# Patient Record
Sex: Female | Born: 1950
Health system: Southern US, Community
[De-identification: ages and names within clinical notes are randomized; demographics above are authoritative.]

## PROBLEM LIST (undated history)

## (undated) ENCOUNTER — Ambulatory Visit: Admission: EM | Discharge status: Absent without leave | Payer: Medicare Other

## (undated) DIAGNOSIS — L578 Other skin changes due to chronic exposure to nonionizing radiation: Secondary | ICD-10-CM

## (undated) DIAGNOSIS — T7840XA Allergy, unspecified, initial encounter: Secondary | ICD-10-CM

## (undated) DIAGNOSIS — D649 Anemia, unspecified: Secondary | ICD-10-CM

## (undated) DIAGNOSIS — IMO0001 Reserved for inherently not codable concepts without codable children: Secondary | ICD-10-CM

## (undated) DIAGNOSIS — M1990 Unspecified osteoarthritis, unspecified site: Secondary | ICD-10-CM

## (undated) DIAGNOSIS — B269 Mumps without complication: Secondary | ICD-10-CM

## (undated) DIAGNOSIS — M62838 Other muscle spasm: Secondary | ICD-10-CM

## (undated) DIAGNOSIS — G4730 Sleep apnea, unspecified: Secondary | ICD-10-CM

## (undated) DIAGNOSIS — K649 Unspecified hemorrhoids: Secondary | ICD-10-CM

## (undated) DIAGNOSIS — K317 Polyp of stomach and duodenum: Secondary | ICD-10-CM

## (undated) DIAGNOSIS — K219 Gastro-esophageal reflux disease without esophagitis: Secondary | ICD-10-CM

## (undated) DIAGNOSIS — S2220XA Unspecified fracture of sternum, initial encounter for closed fracture: Secondary | ICD-10-CM

## (undated) DIAGNOSIS — H269 Unspecified cataract: Secondary | ICD-10-CM

## (undated) DIAGNOSIS — J381 Polyp of vocal cord and larynx: Secondary | ICD-10-CM

## (undated) DIAGNOSIS — E559 Vitamin D deficiency, unspecified: Secondary | ICD-10-CM

## (undated) DIAGNOSIS — K449 Diaphragmatic hernia without obstruction or gangrene: Secondary | ICD-10-CM

## (undated) DIAGNOSIS — Z86010 Personal history of colonic polyps: Secondary | ICD-10-CM

## (undated) DIAGNOSIS — N816 Rectocele: Secondary | ICD-10-CM

## (undated) DIAGNOSIS — I10 Essential (primary) hypertension: Secondary | ICD-10-CM

## (undated) DIAGNOSIS — IMO0002 Reserved for concepts with insufficient information to code with codable children: Secondary | ICD-10-CM

## (undated) DIAGNOSIS — B019 Varicella without complication: Secondary | ICD-10-CM

## (undated) DIAGNOSIS — K589 Irritable bowel syndrome without diarrhea: Secondary | ICD-10-CM

## (undated) DIAGNOSIS — R42 Dizziness and giddiness: Secondary | ICD-10-CM

## (undated) DIAGNOSIS — E871 Hypo-osmolality and hyponatremia: Secondary | ICD-10-CM

## (undated) DIAGNOSIS — M8580 Other specified disorders of bone density and structure, unspecified site: Secondary | ICD-10-CM

## (undated) DIAGNOSIS — H8110 Benign paroxysmal vertigo, unspecified ear: Secondary | ICD-10-CM

## (undated) DIAGNOSIS — L719 Rosacea, unspecified: Secondary | ICD-10-CM

## (undated) DIAGNOSIS — N809 Endometriosis, unspecified: Secondary | ICD-10-CM

## (undated) DIAGNOSIS — B059 Measles without complication: Secondary | ICD-10-CM

## (undated) HISTORY — DX: Hypo-osmolality and hyponatremia: E87.1

## (undated) HISTORY — DX: Polyp of vocal cord and larynx: J38.1

## (undated) HISTORY — DX: Vitamin D deficiency, unspecified: E55.9

## (undated) HISTORY — PX: BREAST BIOPSY: SHX20

## (undated) HISTORY — DX: Sleep apnea, unspecified: G47.30

## (undated) HISTORY — DX: Reserved for inherently not codable concepts without codable children: IMO0001

## (undated) HISTORY — DX: Diaphragmatic hernia without obstruction or gangrene: K44.9

## (undated) HISTORY — DX: Rosacea, unspecified: L71.9

## (undated) HISTORY — DX: Dizziness and giddiness: R42

## (undated) HISTORY — DX: Endometriosis, unspecified: N80.9

## (undated) HISTORY — DX: Unspecified osteoarthritis, unspecified site: M19.90

## (undated) HISTORY — PX: TONSILLECTOMY AND ADENOIDECTOMY: SUR1326

## (undated) HISTORY — DX: Other skin changes due to chronic exposure to nonionizing radiation: L57.8

## (undated) HISTORY — DX: Reserved for concepts with insufficient information to code with codable children: IMO0002

## (undated) HISTORY — DX: Unspecified fracture of sternum, initial encounter for closed fracture: S22.20XA

## (undated) HISTORY — DX: Unspecified cataract: H26.9

## (undated) HISTORY — DX: Benign paroxysmal vertigo, unspecified ear: H81.10

## (undated) HISTORY — DX: Gastro-esophageal reflux disease without esophagitis: K21.9

## (undated) HISTORY — DX: Anemia, unspecified: D64.9

## (undated) HISTORY — DX: Rectocele: N81.6

## (undated) HISTORY — PX: OTHER SURGICAL HISTORY: SHX169

## (undated) HISTORY — DX: Allergy, unspecified, initial encounter: T78.40XA

## (undated) HISTORY — DX: Personal history of colonic polyps: Z86.010

## (undated) HISTORY — DX: Irritable bowel syndrome without diarrhea: K58.9

## (undated) HISTORY — DX: Measles without complication: B05.9

## (undated) HISTORY — DX: Hypocalcemia: E83.51

## (undated) HISTORY — DX: Polyp of stomach and duodenum: K31.7

## (undated) HISTORY — DX: Other specified disorders of bone density and structure, unspecified site: M85.80

## (undated) HISTORY — DX: Other muscle spasm: M62.838

## (undated) HISTORY — DX: Mumps without complication: B26.9

## (undated) HISTORY — DX: Varicella without complication: B01.9

---
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
## 1979-03-27 HISTORY — PX: TUBAL LIGATION: SHX77

## 1987-03-27 HISTORY — PX: CHOLECYSTECTOMY: SHX55

## 1987-03-27 HISTORY — PX: APPENDECTOMY: SHX54

## 1997-03-26 HISTORY — PX: CYSTOSCOPY W/ URETERAL STENT REMOVAL: SHX1430

## 1997-03-26 HISTORY — PX: LEFT OOPHORECTOMY: SHX1961

## 1998-03-26 HISTORY — PX: TOTAL ABDOMINAL HYSTERECTOMY: SHX209

## 1998-03-26 HISTORY — PX: MMK / ANTERIOR VESICOURETHROPEXY / URETHROPEXY: SUR866

## 2003-03-27 HISTORY — PX: HEMORRHOID SURGERY: SHX153

## 2007-10-03 ENCOUNTER — Encounter: Admission: RE | Admit: 2007-10-03 | Discharge: 2007-10-03 | Payer: Self-pay | Admitting: Pediatrics

## 2007-12-24 ENCOUNTER — Emergency Department (HOSPITAL_COMMUNITY): Admission: EM | Admit: 2007-12-24 | Discharge: 2007-12-24 | Payer: Self-pay | Admitting: Emergency Medicine

## 2009-03-26 HISTORY — PX: ESOPHAGOGASTRODUODENOSCOPY: SHX1529

## 2009-03-26 HISTORY — PX: COLONOSCOPY: SHX174

## 2009-10-05 ENCOUNTER — Emergency Department (HOSPITAL_COMMUNITY): Admission: EM | Admit: 2009-10-05 | Discharge: 2009-10-05 | Payer: Self-pay | Admitting: Family Medicine

## 2009-12-15 ENCOUNTER — Ambulatory Visit (HOSPITAL_BASED_OUTPATIENT_CLINIC_OR_DEPARTMENT_OTHER): Admission: RE | Admit: 2009-12-15 | Discharge: 2009-12-15 | Payer: Self-pay | Admitting: Internal Medicine

## 2009-12-15 ENCOUNTER — Ambulatory Visit: Payer: Self-pay | Admitting: Radiology

## 2009-12-19 ENCOUNTER — Ambulatory Visit (HOSPITAL_BASED_OUTPATIENT_CLINIC_OR_DEPARTMENT_OTHER): Admission: RE | Admit: 2009-12-19 | Discharge: 2009-12-19 | Payer: Self-pay | Admitting: Internal Medicine

## 2009-12-19 ENCOUNTER — Ambulatory Visit: Payer: Self-pay | Admitting: Diagnostic Radiology

## 2010-01-20 ENCOUNTER — Encounter (HOSPITAL_COMMUNITY)
Admission: RE | Admit: 2010-01-20 | Discharge: 2010-03-25 | Payer: Self-pay | Source: Home / Self Care | Attending: Interventional Cardiology | Admitting: Interventional Cardiology

## 2010-02-03 ENCOUNTER — Encounter (INDEPENDENT_AMBULATORY_CARE_PROVIDER_SITE_OTHER): Payer: Self-pay | Admitting: Gastroenterology

## 2010-02-03 ENCOUNTER — Ambulatory Visit: Payer: Self-pay | Admitting: Surgery

## 2010-02-03 ENCOUNTER — Inpatient Hospital Stay (HOSPITAL_COMMUNITY)
Admission: RE | Admit: 2010-02-03 | Discharge: 2010-03-03 | Payer: Self-pay | Source: Home / Self Care | Attending: Gastroenterology | Admitting: Gastroenterology

## 2010-02-03 ENCOUNTER — Ambulatory Visit: Payer: Self-pay | Admitting: Internal Medicine

## 2010-03-09 ENCOUNTER — Ambulatory Visit: Payer: Self-pay | Admitting: Surgery

## 2010-03-09 ENCOUNTER — Encounter
Admission: RE | Admit: 2010-03-09 | Discharge: 2010-03-09 | Payer: Self-pay | Source: Home / Self Care | Attending: Surgery | Admitting: Surgery

## 2010-03-28 ENCOUNTER — Ambulatory Visit (HOSPITAL_BASED_OUTPATIENT_CLINIC_OR_DEPARTMENT_OTHER)
Admission: RE | Admit: 2010-03-28 | Discharge: 2010-03-28 | Payer: Self-pay | Source: Home / Self Care | Attending: Surgery | Admitting: Surgery

## 2010-03-29 ENCOUNTER — Ambulatory Visit
Admission: RE | Admit: 2010-03-29 | Discharge: 2010-03-29 | Payer: Self-pay | Source: Home / Self Care | Attending: Surgery | Admitting: Surgery

## 2010-04-10 ENCOUNTER — Ambulatory Visit (HOSPITAL_BASED_OUTPATIENT_CLINIC_OR_DEPARTMENT_OTHER)
Admission: RE | Admit: 2010-04-10 | Discharge: 2010-04-10 | Payer: Self-pay | Source: Home / Self Care | Attending: Gastroenterology | Admitting: Gastroenterology

## 2010-04-17 ENCOUNTER — Ambulatory Visit (HOSPITAL_COMMUNITY)
Admission: RE | Admit: 2010-04-17 | Discharge: 2010-04-17 | Payer: Self-pay | Source: Home / Self Care | Attending: Gastroenterology | Admitting: Gastroenterology

## 2010-04-18 LAB — CBC
HCT: 25.4 % — ABNORMAL LOW (ref 36.0–46.0)
Hemoglobin: 8.4 g/dL — ABNORMAL LOW (ref 12.0–15.0)
MCV: 85.5 fL (ref 78.0–100.0)
RBC: 2.97 MIL/uL — ABNORMAL LOW (ref 3.87–5.11)
RDW: 14.3 % (ref 11.5–15.5)
WBC: 7.2 10*3/uL (ref 4.0–10.5)

## 2010-04-18 LAB — COMPREHENSIVE METABOLIC PANEL
ALT: 26 U/L (ref 0–35)
Alkaline Phosphatase: 100 U/L (ref 39–117)
BUN: 12 mg/dL (ref 6–23)
CO2: 24 mEq/L (ref 19–32)
Chloride: 108 mEq/L (ref 96–112)
Glucose, Bld: 107 mg/dL — ABNORMAL HIGH (ref 70–99)
Potassium: 2.6 mEq/L — CL (ref 3.5–5.1)
Sodium: 139 mEq/L (ref 135–145)
Total Bilirubin: 0.3 mg/dL (ref 0.3–1.2)
Total Protein: 6.5 g/dL (ref 6.0–8.3)

## 2010-04-18 LAB — URINALYSIS, ROUTINE W REFLEX MICROSCOPIC
Ketones, ur: NEGATIVE mg/dL
Leukocytes, UA: NEGATIVE
Protein, ur: NEGATIVE mg/dL
Urine Glucose, Fasting: NEGATIVE mg/dL
Urobilinogen, UA: 0.2 mg/dL (ref 0.0–1.0)

## 2010-04-18 LAB — TYPE AND SCREEN

## 2010-04-18 LAB — PREALBUMIN: Prealbumin: 17.8 mg/dL — ABNORMAL LOW (ref 17.0–34.0)

## 2010-04-18 LAB — URINE MICROSCOPIC-ADD ON

## 2010-04-19 LAB — URINE CULTURE
Colony Count: NO GROWTH
Culture  Setup Time: 201201231432

## 2010-04-21 NOTE — Op Note (Addendum)
  Carrie Elliott, Carrie Elliott                ACCOUNT NO.:  [id number]  MEDICAL RECORD NO.:  [id number]          PATIENT TYPE:  AMB  LOCATION:  ENDO                         FACILITY:  [facility]  PHYSICIAN:  Petra Kuba, M.D.    DATE OF BIRTH:  10/31/1950  DATE OF PROCEDURE:  04/17/2010 DATE OF DISCHARGE:  04/17/2010                              OPERATIVE REPORT   PROCEDURE:  Esophagoscopy with stent removal.  INDICATIONS:  Patient with an esophageal stent placed for esophageal tear, ready to remove it.  Consent was signed after risks, benefits, methods, options thoroughly discussed multiple times in the past.  MEDICINES USED:  Fentanyl 100 mcg, Versed 10 mg, Zosyn prophylaxis.  PROCEDURE:  The video endoscope was inserted by direct vision.  The most proximal part of the esophagus was normal at about 25 cm.  The top of the stent was seen.  Using the rat tooth forceps, the cord to compress the stent was grabbed and withdrawn back into the scope and the scope and the stent were removed through the patient's mouth.  The stent was recovered.  The rat tooth was removed.  The scope was reinserted.  There was some heme from 25 cm to 35 cm, but no active bleeding.  There was a shelf at the 25 cm where the stent had been.  No signs of significant tear were seen.  At the distal esophagus was some inflammation, but again no obvious leak or active bleeding at the end of the procedure. We did advance scope short ways into the stomach.  Air was suctioned. We did not complete the complete visualization of the stomach.  Again, on slow withdrawal through the esophagus, confirmed above findings. There was no proximal esophagus or posterior pharyngeal trauma.  Scope was removed.  The patient tolerated the procedure well.  There was no obvious immediate complications.  ENDOSCOPIC DIAGNOSES: 1. Stent at 25 cm status post grabbed with the rat tooth and removed. 2. After removal, repeat esophagoscopy showed the  ledge at 25 cm with     heme throughout the esophagus as well as some distal heme and edema     without active bleeding or obvious residual tears. 3. Fully advanced to the proximal stomach.  PLAN:  We will go ahead and check a barium swallow; and if that is okay, very slowly advance her diet and slowly wean her TPN and wean her pain medicines hopefully as well and await recheck urinalysis to make sure that has been adequately treated and to call me sooner p.r.n. and otherwise follow up in 2 weeks.          ______________________________ Petra Kuba, M.D.     MEM/MEDQ  D:  04/17/2010  T:  04/18/2010  Job:  [id number]  cc:   Everardo All. Madilyn Fireman, M.D. Evelene Croon, M.D. Gildardo Griffes, M.D.  Electronically Signed by Vida Rigger M.D. on 04/21/2010 04:54:09 AM

## 2010-05-16 ENCOUNTER — Other Ambulatory Visit (HOSPITAL_COMMUNITY): Payer: Self-pay | Admitting: Nephrology

## 2010-05-16 ENCOUNTER — Other Ambulatory Visit: Payer: Self-pay | Admitting: Nephrology

## 2010-05-16 DIAGNOSIS — R93429 Abnormal radiologic findings on diagnostic imaging of unspecified kidney: Secondary | ICD-10-CM

## 2010-05-24 ENCOUNTER — Ambulatory Visit (HOSPITAL_COMMUNITY)
Admission: RE | Admit: 2010-05-24 | Discharge: 2010-05-24 | Disposition: A | Discharge status: Home / Self Care | Payer: 59 | Source: Ambulatory Visit | Attending: Nephrology | Admitting: Nephrology

## 2010-05-24 DIAGNOSIS — K449 Diaphragmatic hernia without obstruction or gangrene: Secondary | ICD-10-CM | POA: Insufficient documentation

## 2010-05-24 DIAGNOSIS — R160 Hepatomegaly, not elsewhere classified: Secondary | ICD-10-CM | POA: Insufficient documentation

## 2010-05-24 DIAGNOSIS — N289 Disorder of kidney and ureter, unspecified: Secondary | ICD-10-CM | POA: Insufficient documentation

## 2010-05-24 DIAGNOSIS — R93429 Abnormal radiologic findings on diagnostic imaging of unspecified kidney: Secondary | ICD-10-CM

## 2010-05-24 DIAGNOSIS — Q619 Cystic kidney disease, unspecified: Secondary | ICD-10-CM | POA: Insufficient documentation

## 2010-06-06 LAB — COMPREHENSIVE METABOLIC PANEL
ALT: 38 U/L — ABNORMAL HIGH (ref 0–35)
ALT: 46 U/L — ABNORMAL HIGH (ref 0–35)
ALT: 38 U/L — ABNORMAL HIGH (ref 0–35)
ALT: 41 U/L — ABNORMAL HIGH (ref 0–35)
ALT: 41 U/L — ABNORMAL HIGH (ref 0–35)
ALT: 43 U/L — ABNORMAL HIGH (ref 0–35)
ALT: 43 U/L — ABNORMAL HIGH (ref 0–35)
ALT: 44 U/L — ABNORMAL HIGH (ref 0–35)
ALT: 45 U/L — ABNORMAL HIGH (ref 0–35)
ALT: 47 U/L — ABNORMAL HIGH (ref 0–35)
ALT: 65 U/L — ABNORMAL HIGH (ref 0–35)
AST: 26 U/L (ref 0–37)
AST: 21 U/L (ref 0–37)
AST: 25 U/L (ref 0–37)
AST: 25 U/L (ref 0–37)
AST: 28 U/L (ref 0–37)
AST: 29 U/L (ref 0–37)
AST: 29 U/L (ref 0–37)
AST: 35 U/L (ref 0–37)
Albumin: 2.4 g/dL — ABNORMAL LOW (ref 3.5–5.2)
Albumin: 2.6 g/dL — ABNORMAL LOW (ref 3.5–5.2)
Albumin: 2.6 g/dL — ABNORMAL LOW (ref 3.5–5.2)
Albumin: 2.2 g/dL — ABNORMAL LOW (ref 3.5–5.2)
Albumin: 2.4 g/dL — ABNORMAL LOW (ref 3.5–5.2)
Albumin: 2.5 g/dL — ABNORMAL LOW (ref 3.5–5.2)
Albumin: 2.6 g/dL — ABNORMAL LOW (ref 3.5–5.2)
Albumin: 2.7 g/dL — ABNORMAL LOW (ref 3.5–5.2)
Albumin: 2.8 g/dL — ABNORMAL LOW (ref 3.5–5.2)
Albumin: 2.8 g/dL — ABNORMAL LOW (ref 3.5–5.2)
Alkaline Phosphatase: 166 U/L — ABNORMAL HIGH (ref 39–117)
Alkaline Phosphatase: 201 U/L — ABNORMAL HIGH (ref 39–117)
Alkaline Phosphatase: 137 U/L — ABNORMAL HIGH (ref 39–117)
Alkaline Phosphatase: 178 U/L — ABNORMAL HIGH (ref 39–117)
Alkaline Phosphatase: 181 U/L — ABNORMAL HIGH (ref 39–117)
Alkaline Phosphatase: 188 U/L — ABNORMAL HIGH (ref 39–117)
Alkaline Phosphatase: 190 U/L — ABNORMAL HIGH (ref 39–117)
Alkaline Phosphatase: 193 U/L — ABNORMAL HIGH (ref 39–117)
Alkaline Phosphatase: 54 U/L (ref 39–117)
BUN: 18 mg/dL (ref 6–23)
BUN: 19 mg/dL (ref 6–23)
BUN: 16 mg/dL (ref 6–23)
BUN: 17 mg/dL (ref 6–23)
BUN: 20 mg/dL (ref 6–23)
BUN: 22 mg/dL (ref 6–23)
BUN: 22 mg/dL (ref 6–23)
BUN: 23 mg/dL (ref 6–23)
BUN: 25 mg/dL — ABNORMAL HIGH (ref 6–23)
BUN: 25 mg/dL — ABNORMAL HIGH (ref 6–23)
BUN: 26 mg/dL — ABNORMAL HIGH (ref 6–23)
BUN: 27 mg/dL — ABNORMAL HIGH (ref 6–23)
BUN: 27 mg/dL — ABNORMAL HIGH (ref 6–23)
CO2: 22 mEq/L (ref 19–32)
CO2: 23 mEq/L (ref 19–32)
CO2: 23 mEq/L (ref 19–32)
CO2: 23 mEq/L (ref 19–32)
CO2: 23 mEq/L (ref 19–32)
CO2: 23 mEq/L (ref 19–32)
CO2: 24 mEq/L (ref 19–32)
CO2: 24 mEq/L (ref 19–32)
CO2: 24 mEq/L (ref 19–32)
CO2: 24 mEq/L (ref 19–32)
CO2: 24 mEq/L (ref 19–32)
CO2: 24 mEq/L (ref 19–32)
CO2: 24 mEq/L (ref 19–32)
CO2: 25 mEq/L (ref 19–32)
Calcium: 8.9 mg/dL (ref 8.4–10.5)
Calcium: 8.1 mg/dL — ABNORMAL LOW (ref 8.4–10.5)
Calcium: 8.8 mg/dL (ref 8.4–10.5)
Calcium: 8.8 mg/dL (ref 8.4–10.5)
Calcium: 8.8 mg/dL (ref 8.4–10.5)
Calcium: 9 mg/dL (ref 8.4–10.5)
Calcium: 9.1 mg/dL (ref 8.4–10.5)
Calcium: 9.2 mg/dL (ref 8.4–10.5)
Calcium: 9.3 mg/dL (ref 8.4–10.5)
Chloride: 100 mEq/L (ref 96–112)
Chloride: 97 mEq/L (ref 96–112)
Chloride: 103 mEq/L (ref 96–112)
Chloride: 104 mEq/L (ref 96–112)
Chloride: 105 mEq/L (ref 96–112)
Chloride: 107 mEq/L (ref 96–112)
Chloride: 107 mEq/L (ref 96–112)
Chloride: 110 mEq/L (ref 96–112)
Chloride: 113 mEq/L — ABNORMAL HIGH (ref 96–112)
Creatinine, Ser: 0.66 mg/dL (ref 0.4–1.2)
Creatinine, Ser: 0.93 mg/dL (ref 0.4–1.2)
Creatinine, Ser: 1.22 mg/dL — ABNORMAL HIGH (ref 0.4–1.2)
Creatinine, Ser: 1.29 mg/dL — ABNORMAL HIGH (ref 0.4–1.2)
Creatinine, Ser: 1.4 mg/dL — ABNORMAL HIGH (ref 0.4–1.2)
Creatinine, Ser: 1.53 mg/dL — ABNORMAL HIGH (ref 0.4–1.2)
Creatinine, Ser: 1.6 mg/dL — ABNORMAL HIGH (ref 0.4–1.2)
Creatinine, Ser: 1.64 mg/dL — ABNORMAL HIGH (ref 0.4–1.2)
Creatinine, Ser: 2.07 mg/dL — ABNORMAL HIGH (ref 0.4–1.2)
GFR calc Af Amer: >60 mL/min (ref >60)
GFR calc Af Amer: 30 mL/min — ABNORMAL LOW (ref >60)
GFR calc Af Amer: 49 mL/min — ABNORMAL LOW (ref >60)
GFR calc Af Amer: 51 mL/min — ABNORMAL LOW (ref >60)
GFR calc Af Amer: 55 mL/min — ABNORMAL LOW (ref >60)
GFR calc Af Amer: >60 mL/min (ref >60)
GFR calc Af Amer: >60 mL/min (ref >60)
GFR calc non Af Amer: >60 mL/min (ref >60)
GFR calc non Af Amer: 25 mL/min — ABNORMAL LOW (ref >60)
GFR calc non Af Amer: 32 mL/min — ABNORMAL LOW (ref >60)
GFR calc non Af Amer: 33 mL/min — ABNORMAL LOW (ref >60)
GFR calc non Af Amer: 35 mL/min — ABNORMAL LOW (ref >60)
GFR calc non Af Amer: 38 mL/min — ABNORMAL LOW (ref >60)
GFR calc non Af Amer: 40 mL/min — ABNORMAL LOW (ref >60)
GFR calc non Af Amer: 43 mL/min — ABNORMAL LOW (ref >60)
GFR calc non Af Amer: 45 mL/min — ABNORMAL LOW (ref >60)
GFR calc non Af Amer: 46 mL/min — ABNORMAL LOW (ref >60)
GFR calc non Af Amer: 52 mL/min — ABNORMAL LOW (ref >60)
GFR calc non Af Amer: 60 mL/min — ABNORMAL LOW (ref >60)
Glucose, Bld: 159 mg/dL — ABNORMAL HIGH (ref 70–99)
Glucose, Bld: 109 mg/dL — ABNORMAL HIGH (ref 70–99)
Glucose, Bld: 112 mg/dL — ABNORMAL HIGH (ref 70–99)
Glucose, Bld: 116 mg/dL — ABNORMAL HIGH (ref 70–99)
Glucose, Bld: 126 mg/dL — ABNORMAL HIGH (ref 70–99)
Glucose, Bld: 133 mg/dL — ABNORMAL HIGH (ref 70–99)
Glucose, Bld: 97 mg/dL (ref 70–99)
Potassium: 3.5 mEq/L (ref 3.5–5.1)
Potassium: 4 mEq/L (ref 3.5–5.1)
Potassium: 3.1 mEq/L — ABNORMAL LOW (ref 3.5–5.1)
Potassium: 3.4 mEq/L — ABNORMAL LOW (ref 3.5–5.1)
Potassium: 3.6 mEq/L (ref 3.5–5.1)
Potassium: 3.7 mEq/L (ref 3.5–5.1)
Potassium: 3.8 mEq/L (ref 3.5–5.1)
Potassium: 3.8 mEq/L (ref 3.5–5.1)
Sodium: 132 mEq/L — ABNORMAL LOW (ref 135–145)
Sodium: 132 mEq/L — ABNORMAL LOW (ref 135–145)
Sodium: 134 mEq/L — ABNORMAL LOW (ref 135–145)
Sodium: 134 mEq/L — ABNORMAL LOW (ref 135–145)
Sodium: 136 mEq/L (ref 135–145)
Sodium: 136 mEq/L (ref 135–145)
Sodium: 136 mEq/L (ref 135–145)
Sodium: 137 mEq/L (ref 135–145)
Sodium: 137 mEq/L (ref 135–145)
Sodium: 139 mEq/L (ref 135–145)
Total Bilirubin: 0.3 mg/dL (ref 0.3–1.2)
Total Bilirubin: <0.1 mg/dL — ABNORMAL LOW (ref 0.3–1.2)
Total Bilirubin: 0.2 mg/dL — ABNORMAL LOW (ref 0.3–1.2)
Total Bilirubin: 0.3 mg/dL (ref 0.3–1.2)
Total Bilirubin: 0.3 mg/dL (ref 0.3–1.2)
Total Bilirubin: 0.3 mg/dL (ref 0.3–1.2)
Total Bilirubin: 0.3 mg/dL (ref 0.3–1.2)
Total Bilirubin: 0.4 mg/dL (ref 0.3–1.2)
Total Bilirubin: 0.6 mg/dL (ref 0.3–1.2)
Total Bilirubin: 0.6 mg/dL (ref 0.3–1.2)
Total Protein: 6.3 g/dL (ref 6.0–8.3)
Total Protein: 5.1 g/dL — ABNORMAL LOW (ref 6.0–8.3)
Total Protein: 5.8 g/dL — ABNORMAL LOW (ref 6.0–8.3)
Total Protein: 6 g/dL (ref 6.0–8.3)
Total Protein: 6.2 g/dL (ref 6.0–8.3)
Total Protein: 6.3 g/dL (ref 6.0–8.3)
Total Protein: 6.3 g/dL (ref 6.0–8.3)
Total Protein: 6.6 g/dL (ref 6.0–8.3)
Total Protein: 6.7 g/dL (ref 6.0–8.3)

## 2010-06-06 LAB — PROTIME-INR
INR: 0.99 (ref 0.00–1.49)
INR: 1.19 (ref 0.00–1.49)
Prothrombin Time: 15.3 seconds — ABNORMAL HIGH (ref 11.6–15.2)

## 2010-06-06 LAB — HEPATIC FUNCTION PANEL
Albumin: 4.3 g/dL (ref 3.5–5.2)
Bilirubin, Direct: 0.1 mg/dL (ref 0.0–0.3)
Indirect Bilirubin: 0.6 mg/dL (ref 0.3–0.9)
Total Bilirubin: 0.7 mg/dL (ref 0.3–1.2)

## 2010-06-06 LAB — POCT I-STAT 3, ART BLOOD GAS (G3+)
Acid-base deficit: 4 mmol/L — ABNORMAL HIGH (ref 0.0–2.0)
Bicarbonate: 21.3 mEq/L (ref 20.0–24.0)
O2 Saturation: 95 %
O2 Saturation: 98 %
TCO2: 18 mmol/L (ref 0–100)
TCO2: 22 mmol/L (ref 0–100)
pCO2 arterial: 32.8 mmHg — ABNORMAL LOW (ref 35.0–45.0)
pCO2 arterial: 36.9 mmHg (ref 35.0–45.0)
pH, Arterial: 7.324 — ABNORMAL LOW (ref 7.350–7.400)
pO2, Arterial: 77 mmHg — ABNORMAL LOW (ref 80.0–100.0)

## 2010-06-06 LAB — CBC
HCT: 24.5 % — ABNORMAL LOW (ref 36.0–46.0)
HCT: 26.6 % — ABNORMAL LOW (ref 36.0–46.0)
HCT: 23.1 % — ABNORMAL LOW (ref 36.0–46.0)
HCT: 23.8 % — ABNORMAL LOW (ref 36.0–46.0)
HCT: 24.2 % — ABNORMAL LOW (ref 36.0–46.0)
HCT: 24.7 % — ABNORMAL LOW (ref 36.0–46.0)
HCT: 24.8 % — ABNORMAL LOW (ref 36.0–46.0)
HCT: 25.8 % — ABNORMAL LOW (ref 36.0–46.0)
HCT: 26.2 % — ABNORMAL LOW (ref 36.0–46.0)
HCT: 26.5 % — ABNORMAL LOW (ref 36.0–46.0)
HCT: 26.8 % — ABNORMAL LOW (ref 36.0–46.0)
HCT: 27.2 % — ABNORMAL LOW (ref 36.0–46.0)
HCT: 27.4 % — ABNORMAL LOW (ref 36.0–46.0)
HCT: 39 % (ref 36.0–46.0)
Hemoglobin: 7.8 g/dL — ABNORMAL LOW (ref 12.0–15.0)
Hemoglobin: 8 g/dL — ABNORMAL LOW (ref 12.0–15.0)
Hemoglobin: 8.5 g/dL — ABNORMAL LOW (ref 12.0–15.0)
Hemoglobin: 7.8 g/dL — ABNORMAL LOW (ref 12.0–15.0)
Hemoglobin: 7.9 g/dL — ABNORMAL LOW (ref 12.0–15.0)
Hemoglobin: 7.9 g/dL — ABNORMAL LOW (ref 12.0–15.0)
Hemoglobin: 8 g/dL — ABNORMAL LOW (ref 12.0–15.0)
Hemoglobin: 8.1 g/dL — ABNORMAL LOW (ref 12.0–15.0)
Hemoglobin: 8.2 g/dL — ABNORMAL LOW (ref 12.0–15.0)
Hemoglobin: 8.2 g/dL — ABNORMAL LOW (ref 12.0–15.0)
Hemoglobin: 8.3 g/dL — ABNORMAL LOW (ref 12.0–15.0)
Hemoglobin: 8.6 g/dL — ABNORMAL LOW (ref 12.0–15.0)
Hemoglobin: 8.6 g/dL — ABNORMAL LOW (ref 12.0–15.0)
Hemoglobin: 8.7 g/dL — ABNORMAL LOW (ref 12.0–15.0)
Hemoglobin: 8.9 g/dL — ABNORMAL LOW (ref 12.0–15.0)
Hemoglobin: 9.2 g/dL — ABNORMAL LOW (ref 12.0–15.0)
Hemoglobin: 9.3 g/dL — ABNORMAL LOW (ref 12.0–15.0)
Hemoglobin: 9.9 g/dL — ABNORMAL LOW (ref 12.0–15.0)
MCH: 29.1 pg (ref 26.0–34.0)
MCH: 29.2 pg (ref 26.0–34.0)
MCH: 28.8 pg (ref 26.0–34.0)
MCH: 29 pg (ref 26.0–34.0)
MCH: 29.1 pg (ref 26.0–34.0)
MCH: 29.1 pg (ref 26.0–34.0)
MCH: 29.2 pg (ref 26.0–34.0)
MCH: 29.2 pg (ref 26.0–34.0)
MCH: 29.3 pg (ref 26.0–34.0)
MCH: 29.4 pg (ref 26.0–34.0)
MCH: 29.5 pg (ref 26.0–34.0)
MCHC: 32.2 g/dL (ref 30.0–36.0)
MCHC: 32.5 g/dL (ref 30.0–36.0)
MCHC: 32.6 g/dL (ref 30.0–36.0)
MCHC: 32.6 g/dL (ref 30.0–36.0)
MCHC: 32.7 g/dL (ref 30.0–36.0)
MCHC: 32.8 g/dL (ref 30.0–36.0)
MCHC: 32.8 g/dL (ref 30.0–36.0)
MCHC: 32.8 g/dL (ref 30.0–36.0)
MCHC: 32.9 g/dL (ref 30.0–36.0)
MCHC: 33.6 g/dL (ref 30.0–36.0)
MCHC: 33.8 g/dL (ref 30.0–36.0)
MCHC: 34.6 g/dL (ref 30.0–36.0)
MCV: 89 fL (ref 78.0–100.0)
MCV: 89.4 fL (ref 78.0–100.0)
MCV: 86.3 fL (ref 78.0–100.0)
MCV: 86.6 fL (ref 78.0–100.0)
MCV: 87 fL (ref 78.0–100.0)
MCV: 87.8 fL (ref 78.0–100.0)
MCV: 87.8 fL (ref 78.0–100.0)
MCV: 88.7 fL (ref 78.0–100.0)
MCV: 88.8 fL (ref 78.0–100.0)
MCV: 89.2 fL (ref 78.0–100.0)
MCV: 89.8 fL (ref 78.0–100.0)
MCV: 90.1 fL (ref 78.0–100.0)
MCV: 90.2 fL (ref 78.0–100.0)
MCV: 90.3 fL (ref 78.0–100.0)
MCV: 90.4 fL (ref 78.0–100.0)
MCV: 90.6 fL (ref 78.0–100.0)
Platelets: 224 10*3/uL (ref 150–400)
Platelets: 256 10*3/uL (ref 150–400)
Platelets: 137 10*3/uL — ABNORMAL LOW (ref 150–400)
Platelets: 170 10*3/uL (ref 150–400)
Platelets: 195 10*3/uL (ref 150–400)
Platelets: 216 10*3/uL (ref 150–400)
Platelets: 224 10*3/uL (ref 150–400)
Platelets: 233 10*3/uL (ref 150–400)
Platelets: 248 10*3/uL (ref 150–400)
Platelets: 250 10*3/uL (ref 150–400)
Platelets: 257 10*3/uL (ref 150–400)
Platelets: 260 10*3/uL (ref 150–400)
RBC: 2.74 MIL/uL — ABNORMAL LOW (ref 3.87–5.11)
RBC: 2.99 MIL/uL — ABNORMAL LOW (ref 3.87–5.11)
RBC: 2.67 MIL/uL — ABNORMAL LOW (ref 3.87–5.11)
RBC: 2.72 MIL/uL — ABNORMAL LOW (ref 3.87–5.11)
RBC: 2.77 MIL/uL — ABNORMAL LOW (ref 3.87–5.11)
RBC: 2.78 MIL/uL — ABNORMAL LOW (ref 3.87–5.11)
RBC: 2.88 MIL/uL — ABNORMAL LOW (ref 3.87–5.11)
RBC: 2.93 MIL/uL — ABNORMAL LOW (ref 3.87–5.11)
RBC: 2.96 MIL/uL — ABNORMAL LOW (ref 3.87–5.11)
RBC: 2.99 MIL/uL — ABNORMAL LOW (ref 3.87–5.11)
RBC: 3.07 MIL/uL — ABNORMAL LOW (ref 3.87–5.11)
RBC: 3.12 MIL/uL — ABNORMAL LOW (ref 3.87–5.11)
RDW: 14.3 % (ref 11.5–15.5)
RDW: 14.4 % (ref 11.5–15.5)
RDW: 14.8 % (ref 11.5–15.5)
RDW: 15.3 % (ref 11.5–15.5)
RDW: 15.4 % (ref 11.5–15.5)
RDW: 15.6 % — ABNORMAL HIGH (ref 11.5–15.5)
RDW: 15.7 % — ABNORMAL HIGH (ref 11.5–15.5)
RDW: 15.8 % — ABNORMAL HIGH (ref 11.5–15.5)
RDW: 15.9 % — ABNORMAL HIGH (ref 11.5–15.5)
WBC: 7.4 10*3/uL (ref 4.0–10.5)
WBC: 9.6 10*3/uL (ref 4.0–10.5)
WBC: 10.5 10*3/uL (ref 4.0–10.5)
WBC: 10.5 10*3/uL (ref 4.0–10.5)
WBC: 11.7 10*3/uL — ABNORMAL HIGH (ref 4.0–10.5)
WBC: 12.1 10*3/uL — ABNORMAL HIGH (ref 4.0–10.5)
WBC: 6.5 10*3/uL (ref 4.0–10.5)
WBC: 6.8 10*3/uL (ref 4.0–10.5)
WBC: 7.1 10*3/uL (ref 4.0–10.5)
WBC: 8.3 10*3/uL (ref 4.0–10.5)
WBC: 8.4 10*3/uL (ref 4.0–10.5)
WBC: 8.9 10*3/uL (ref 4.0–10.5)
WBC: 9 10*3/uL (ref 4.0–10.5)
WBC: 9.6 10*3/uL (ref 4.0–10.5)

## 2010-06-06 LAB — GLUCOSE, CAPILLARY
Glucose-Capillary: 112 mg/dL — ABNORMAL HIGH (ref 70–99)
Glucose-Capillary: 112 mg/dL — ABNORMAL HIGH (ref 70–99)
Glucose-Capillary: 114 mg/dL — ABNORMAL HIGH (ref 70–99)
Glucose-Capillary: 119 mg/dL — ABNORMAL HIGH (ref 70–99)
Glucose-Capillary: 120 mg/dL — ABNORMAL HIGH (ref 70–99)
Glucose-Capillary: 122 mg/dL — ABNORMAL HIGH (ref 70–99)
Glucose-Capillary: 129 mg/dL — ABNORMAL HIGH (ref 70–99)
Glucose-Capillary: 131 mg/dL — ABNORMAL HIGH (ref 70–99)
Glucose-Capillary: 135 mg/dL — ABNORMAL HIGH (ref 70–99)
Glucose-Capillary: 137 mg/dL — ABNORMAL HIGH (ref 70–99)
Glucose-Capillary: 138 mg/dL — ABNORMAL HIGH (ref 70–99)
Glucose-Capillary: 138 mg/dL — ABNORMAL HIGH (ref 70–99)
Glucose-Capillary: 139 mg/dL — ABNORMAL HIGH (ref 70–99)
Glucose-Capillary: 140 mg/dL — ABNORMAL HIGH (ref 70–99)
Glucose-Capillary: 146 mg/dL — ABNORMAL HIGH (ref 70–99)
Glucose-Capillary: 148 mg/dL — ABNORMAL HIGH (ref 70–99)
Glucose-Capillary: 149 mg/dL — ABNORMAL HIGH (ref 70–99)
Glucose-Capillary: 157 mg/dL — ABNORMAL HIGH (ref 70–99)
Glucose-Capillary: 180 mg/dL — ABNORMAL HIGH (ref 70–99)

## 2010-06-06 LAB — BASIC METABOLIC PANEL
BUN: 10 mg/dL (ref 6–23)
BUN: 14 mg/dL (ref 6–23)
CO2: 26 mEq/L (ref 19–32)
Calcium: 8 mg/dL — ABNORMAL LOW (ref 8.4–10.5)
Calcium: 9.3 mg/dL (ref 8.4–10.5)
Chloride: 100 mEq/L (ref 96–112)
Chloride: 111 mEq/L (ref 96–112)
GFR calc Af Amer: >60 mL/min (ref >60)
GFR calc Af Amer: 25 mL/min — ABNORMAL LOW (ref >60)
GFR calc Af Amer: 25 mL/min — ABNORMAL LOW (ref >60)
GFR calc non Af Amer: 20 mL/min — ABNORMAL LOW (ref >60)
GFR calc non Af Amer: 29 mL/min — ABNORMAL LOW (ref >60)
GFR calc non Af Amer: >60 mL/min (ref >60)
Glucose, Bld: 105 mg/dL — ABNORMAL HIGH (ref 70–99)
Glucose, Bld: 124 mg/dL — ABNORMAL HIGH (ref 70–99)
Glucose, Bld: 127 mg/dL — ABNORMAL HIGH (ref 70–99)
Glucose, Bld: 140 mg/dL — ABNORMAL HIGH (ref 70–99)
Potassium: 3.9 mEq/L (ref 3.5–5.1)
Potassium: 3.5 mEq/L (ref 3.5–5.1)
Potassium: 3.7 mEq/L (ref 3.5–5.1)
Potassium: 3.9 mEq/L (ref 3.5–5.1)
Sodium: 133 mEq/L — ABNORMAL LOW (ref 135–145)
Sodium: 137 mEq/L (ref 135–145)
Sodium: 138 mEq/L (ref 135–145)
Sodium: 139 mEq/L (ref 135–145)

## 2010-06-06 LAB — POCT I-STAT 7, (LYTES, BLD GAS, ICA,H+H)
Calcium, Ion: 1.18 mmol/L (ref 1.12–1.32)
HCT: 37 % (ref 36.0–46.0)
Hemoglobin: 12.6 g/dL (ref 12.0–15.0)
pCO2 arterial: 43.2 mmHg (ref 35.0–45.0)
pH, Arterial: 7.32 — ABNORMAL LOW (ref 7.350–7.400)
pO2, Arterial: 386 mmHg — ABNORMAL HIGH (ref 80.0–100.0)

## 2010-06-06 LAB — DIFFERENTIAL
Basophils Absolute: 0 10*3/uL (ref 0.0–0.1)
Basophils Absolute: 0 10*3/uL (ref 0.0–0.1)
Basophils Relative: 0 % (ref 0–1)
Basophils Relative: 0 % (ref 0–1)
Eosinophils Absolute: 0 10*3/uL (ref 0.0–0.7)
Eosinophils Absolute: 0.2 10*3/uL (ref 0.0–0.7)
Eosinophils Absolute: 0.2 10*3/uL (ref 0.0–0.7)
Eosinophils Relative: 4 % (ref 0–5)
Eosinophils Relative: 0 % (ref 0–5)
Eosinophils Relative: 1 % (ref 0–5)
Eosinophils Relative: 2 % (ref 0–5)
Eosinophils Relative: 2 % (ref 0–5)
Eosinophils Relative: 3 % (ref 0–5)
Lymphocytes Relative: 10 % — ABNORMAL LOW (ref 12–46)
Lymphocytes Relative: 10 % — ABNORMAL LOW (ref 12–46)
Lymphocytes Relative: 12 % (ref 12–46)
Lymphocytes Relative: 6 % — ABNORMAL LOW (ref 12–46)
Lymphs Abs: 0.7 10*3/uL (ref 0.7–4.0)
Lymphs Abs: 0.7 10*3/uL (ref 0.7–4.0)
Lymphs Abs: 0.9 10*3/uL (ref 0.7–4.0)
Lymphs Abs: 0.9 10*3/uL (ref 0.7–4.0)
Monocytes Absolute: 1 10*3/uL (ref 0.1–1.0)
Monocytes Absolute: 1.4 10*3/uL — ABNORMAL HIGH (ref 0.1–1.0)
Monocytes Relative: 12 % (ref 3–12)
Monocytes Relative: 14 % — ABNORMAL HIGH (ref 3–12)
Monocytes Relative: 16 % — ABNORMAL HIGH (ref 3–12)
Monocytes Relative: 6 % (ref 3–12)
Neutro Abs: 5.1 10*3/uL (ref 1.7–7.7)
Neutro Abs: 5.4 10*3/uL (ref 1.7–7.7)
Neutrophils Relative %: 85 % — ABNORMAL HIGH (ref 43–77)

## 2010-06-06 LAB — PHOSPHORUS
Phosphorus: 3.1 mg/dL (ref 2.3–4.6)
Phosphorus: 4.4 mg/dL (ref 2.3–4.6)
Phosphorus: 4.5 mg/dL (ref 2.3–4.6)
Phosphorus: 3.3 mg/dL (ref 2.3–4.6)
Phosphorus: 4.3 mg/dL (ref 2.3–4.6)

## 2010-06-06 LAB — RENAL FUNCTION PANEL
CO2: 22 mEq/L (ref 19–32)
Calcium: 8.8 mg/dL (ref 8.4–10.5)
GFR calc Af Amer: 33 mL/min — ABNORMAL LOW (ref >60)
GFR calc non Af Amer: 27 mL/min — ABNORMAL LOW (ref >60)
Glucose, Bld: 142 mg/dL — ABNORMAL HIGH (ref 70–99)
Potassium: 2.8 mEq/L — ABNORMAL LOW (ref 3.5–5.1)
Sodium: 137 mEq/L (ref 135–145)

## 2010-06-06 LAB — MAGNESIUM
Magnesium: 1.5 mg/dL (ref 1.5–2.5)
Magnesium: 1.8 mg/dL (ref 1.5–2.5)
Magnesium: 2 mg/dL (ref 1.5–2.5)
Magnesium: 2 mg/dL (ref 1.5–2.5)
Magnesium: 2.1 mg/dL (ref 1.5–2.5)
Magnesium: 2.2 mg/dL (ref 1.5–2.5)
Magnesium: 2.2 mg/dL (ref 1.5–2.5)

## 2010-06-06 LAB — TRIGLYCERIDES
Triglycerides: 114 mg/dL (ref <150)
Triglycerides: 122 mg/dL (ref <150)

## 2010-06-06 LAB — CLOSTRIDIUM DIFFICILE EIA: C difficile Toxins A+B, EIA: NEGATIVE

## 2010-06-06 LAB — IRON AND TIBC: UIBC: 209 ug/dL

## 2010-06-06 LAB — BODY FLUID CELL COUNT WITH DIFFERENTIAL
Neutrophil Count, Fluid: 84 % — ABNORMAL HIGH (ref 0–25)
Total Nucleated Cell Count, Fluid: 6346 cu mm — ABNORMAL HIGH (ref 0–1000)

## 2010-06-06 LAB — CLOSTRIDIUM DIFFICILE BY PCR: Toxigenic C. Difficile by PCR: NOT DETECTED

## 2010-06-06 LAB — PROCALCITONIN: Procalcitonin: 1.55 ng/mL

## 2010-06-06 LAB — ABO/RH: ABO/RH(D): O POS

## 2010-06-06 LAB — LIPID PANEL
HDL: 29 mg/dL — ABNORMAL LOW (ref >39)
Total CHOL/HDL Ratio: 4.4 RATIO
VLDL: 23 mg/dL (ref 0–40)

## 2010-06-06 LAB — CHOLESTEROL, TOTAL
Cholesterol: 129 mg/dL (ref 0–200)
Cholesterol: 131 mg/dL (ref 0–200)

## 2010-06-06 LAB — BODY FLUID CULTURE: Gram Stain: NONE SEEN

## 2010-06-06 LAB — TYPE AND SCREEN: ABO/RH(D): O POS

## 2010-06-06 LAB — AMYLASE, BODY FLUID

## 2010-06-06 LAB — PREALBUMIN
Prealbumin: 14.4 mg/dL — ABNORMAL LOW (ref 18.0–45.0)
Prealbumin: 8.6 mg/dL — ABNORMAL LOW (ref 18.0–45.0)

## 2010-06-06 LAB — AMYLASE: Amylase: 93 U/L (ref 0–105)

## 2010-06-06 LAB — OSMOLALITY: Osmolality: 280 mOsm/kg (ref 275–300)

## 2010-06-06 LAB — SODIUM, URINE, RANDOM: Sodium, Ur: 30 mEq/L

## 2010-06-06 LAB — CARBOXYHEMOGLOBIN: Carboxyhemoglobin: 1.2 % (ref 0.5–1.5)

## 2010-06-06 LAB — LIPASE, BLOOD: Lipase: 32 U/L (ref 11–59)

## 2010-08-08 NOTE — Assessment & Plan Note (Signed)
OFFICE VISIT   Carrie Elliott, Carrie Elliott  DOB:  August 29, 1950                                        March 09, 2010  CHART #:  [id number]   HISTORY:  The patient returned to my office today for followup of left  pleural effusion.  She is 60 year old woman who had an iatrogenic distal  esophageal perforation during an upper endoscopy and dilatation  procedure.  This was treated with a covered stent by [facility].  She  spent several weeks in the hospital, recovering and getting back on a  diet.  Her last esophagram showed no evidence of leak and she was  tolerating a p.o. diet fairly well and was discharged home.  She said  that since being home she has had more discomfort with eating and had a  significant amount of weight loss due to inadequate p.o. intake.  She  was started back on TNA.  She is currently taking oxycodone 7.5 for  pain.  She describes this as being in the center of her chest and is  made worse by eating.  She has had no fever or chills.  Since being back  on TNA, she said her weight has stabilized.   PHYSICAL EXAMINATION:  Today, she is afebrile.  Blood pressure is  137/87, pulse 84 and regular, and respiratory rate is 18 and unlabored.  Oxygen saturation on room air is 99%.  She looks tired, but in no  distress.  Cardiac exam shows regular rate and rhythm with normal heart  sounds.  Her lung exam reveals decreased breath sounds at the left base.   DIAGNOSTIC TESTS:  Followup chest x-ray today shows a small residual  left pleural effusion which is not changed from her previous chest x-  ray.  There is left basilar atelectasis.  There is no pneumothorax.  The  lungs are otherwise clear.   IMPRESSION:  The patient has a stable small left pleural effusion which  is felt to be inflammatory.  The Gram stain of her pleural fluid had  white blood cells and PMNs, but cytology only showed reactive  mesothelial cells and cultures were negative.  There  has been no  significant change in the effusion since discharge.  I will plan to see  her back in about 3 weeks with a followup chest x-ray to reassess this.  I asked her to call me sooner if she develops any change in  her breathing.  She will continue to follow up with Dr. Dorena Cookey from  [facility] concerning her stent and has a followup appointment to see  him at the end of December.   Evelene Croon, M.D.  Electronically Signed   BB/MEDQ  D:  03/09/2010  T:  03/10/2010  Job:  [id number]   cc:   Everardo All. Madilyn Fireman, M.D.

## 2010-08-08 NOTE — Assessment & Plan Note (Signed)
[facility] OFFICE VISIT   Carrie Elliott, Carrie Elliott  DOB:  May 07, 1950                                        March 29, 2010  CHART #:  [id number]   HISTORY:  The patient returned to my office today for followup of left  pleural effusion.  She is a 60 year old woman who had an iatrogenic  distal esophageal perforation treated by [facility] with a covered  stent.  Since I last saw her, she has continued to improve overall.  She  is still taking TNA to supplement her nutrition.  She is able to take  some oral food, but does have episodes of nausea and emesis.  She states  she still has a lot of pain in her midchest with swallowing.  She denies  any fever or chills.  She has had no cough or sputum production.  She  denies any hemoptysis.   PHYSICAL EXAMINATION:  Today, she looks well.  Her lungs are clear.   DIAGNOSTIC TESTS:  A chest x-ray today shows complete resolution of her  left pleural effusion.  The lungs are otherwise clear.  The covered  stent is in its expected position in the mid to distal esophagus.   IMPRESSION:  The patient has had complete resolution of her left pleural  effusion.  She has no clinical signs of esophageal leakage, but is still  having a lot of discomfort with swallowing food and liquids and is also  having some nausea and emesis.  Dr. Ewing Schlein has told her that he at this  time planning on taking the stent out around April 18, 2010, which I  think would be more than adequate time for the esophagus to heal.  I do  not see any benefit to leaving the stent any longer since she is fairly  symptomatic and would be concerned about these episodes of emesis and  retching and what that may due to her esophagus.  She will continue to  follow up with Dr. Ewing Schlein and I told her I did not need to see her back  unless further complications arise.   Evelene Croon, M.D.  Electronically Signed   BB/MEDQ  D:  03/29/2010  T:  03/30/2010  Job:  [id number]   cc:    Petra Kuba, M.D.

## 2011-01-31 ENCOUNTER — Other Ambulatory Visit: Payer: Self-pay | Admitting: Gastroenterology

## 2011-01-31 DIAGNOSIS — K862 Cyst of pancreas: Secondary | ICD-10-CM

## 2011-03-02 ENCOUNTER — Other Ambulatory Visit (HOSPITAL_COMMUNITY): Payer: 59

## 2011-03-02 ENCOUNTER — Other Ambulatory Visit: Payer: 59

## 2011-03-09 ENCOUNTER — Other Ambulatory Visit (HOSPITAL_COMMUNITY): Payer: Self-pay | Admitting: Gastroenterology

## 2011-03-09 ENCOUNTER — Ambulatory Visit (HOSPITAL_COMMUNITY)
Admission: RE | Admit: 2011-03-09 | Discharge: 2011-03-09 | Disposition: A | Discharge status: Home / Self Care | Payer: 59 | Source: Ambulatory Visit | Attending: Gastroenterology | Admitting: Gastroenterology

## 2011-03-09 DIAGNOSIS — K862 Cyst of pancreas: Secondary | ICD-10-CM | POA: Insufficient documentation

## 2011-03-09 DIAGNOSIS — R911 Solitary pulmonary nodule: Secondary | ICD-10-CM

## 2011-03-09 DIAGNOSIS — K863 Pseudocyst of pancreas: Secondary | ICD-10-CM | POA: Insufficient documentation

## 2011-03-09 DIAGNOSIS — Q619 Cystic kidney disease, unspecified: Secondary | ICD-10-CM | POA: Insufficient documentation

## 2011-03-09 MED ORDER — GADOBENATE DIMEGLUMINE 529 MG/ML IV SOLN
15.0000 mL | Freq: Once | INTRAVENOUS | Status: AC
  Administered 2011-03-09: 15 mL via INTRAVENOUS

## 2011-03-12 ENCOUNTER — Ambulatory Visit (HOSPITAL_COMMUNITY)
Admission: RE | Admit: 2011-03-12 | Discharge: 2011-03-12 | Disposition: A | Discharge status: Home / Self Care | Payer: 59 | Source: Ambulatory Visit | Attending: Gastroenterology | Admitting: Gastroenterology

## 2011-03-12 DIAGNOSIS — K7689 Other specified diseases of liver: Secondary | ICD-10-CM | POA: Insufficient documentation

## 2011-03-12 DIAGNOSIS — K449 Diaphragmatic hernia without obstruction or gangrene: Secondary | ICD-10-CM | POA: Insufficient documentation

## 2011-03-12 DIAGNOSIS — R911 Solitary pulmonary nodule: Secondary | ICD-10-CM | POA: Insufficient documentation

## 2011-03-12 DIAGNOSIS — J90 Pleural effusion, not elsewhere classified: Secondary | ICD-10-CM | POA: Insufficient documentation

## 2011-03-12 MED ORDER — IOHEXOL 300 MG/ML  SOLN
80.0000 mL | Freq: Once | INTRAMUSCULAR | Status: AC | PRN
  Administered 2011-03-12: 80 mL via INTRAVENOUS

## 2011-03-28 ENCOUNTER — Encounter: Payer: Self-pay | Admitting: Critical Care Medicine

## 2011-03-29 ENCOUNTER — Encounter: Payer: Self-pay | Admitting: Critical Care Medicine

## 2011-03-29 ENCOUNTER — Ambulatory Visit (INDEPENDENT_AMBULATORY_CARE_PROVIDER_SITE_OTHER): Payer: 59 | Admitting: Critical Care Medicine

## 2011-03-29 DIAGNOSIS — K589 Irritable bowel syndrome without diarrhea: Secondary | ICD-10-CM | POA: Insufficient documentation

## 2011-03-29 DIAGNOSIS — IMO0001 Reserved for inherently not codable concepts without codable children: Secondary | ICD-10-CM

## 2011-03-29 DIAGNOSIS — M129 Arthropathy, unspecified: Secondary | ICD-10-CM

## 2011-03-29 DIAGNOSIS — R05 Cough: Secondary | ICD-10-CM

## 2011-03-29 DIAGNOSIS — M1990 Unspecified osteoarthritis, unspecified site: Secondary | ICD-10-CM | POA: Insufficient documentation

## 2011-03-29 DIAGNOSIS — J309 Allergic rhinitis, unspecified: Secondary | ICD-10-CM | POA: Insufficient documentation

## 2011-03-29 DIAGNOSIS — K219 Gastro-esophageal reflux disease without esophagitis: Secondary | ICD-10-CM | POA: Insufficient documentation

## 2011-03-29 DIAGNOSIS — R059 Cough, unspecified: Secondary | ICD-10-CM

## 2011-03-29 DIAGNOSIS — R911 Solitary pulmonary nodule: Secondary | ICD-10-CM

## 2011-03-29 NOTE — Patient Instructions (Signed)
No change in medications Repeat CT Chest in 3 months to follow the Left lung nodule, we will schedule  Return after CT repeated in 3months

## 2011-03-29 NOTE — Progress Notes (Signed)
Subjective:    Patient ID: Carrie Elliott, female    DOB: Aug 20, 1950, 61 y.o.   MRN: [id number] 61 y.o.F with hx of esophageal perforation, now LPR, cyclic cough and LLL ground glass airspace disease on CT Chest HPI Comments: On 02/03/10 routine endoscopy perforated esoph, stent esophagus, LLL collapsed with pleural effusion,  Stent taken out 04/17/10.   Had ICU stay with vent placed Cyst on kidney seen while in icu, abd MRI 2/12: not significant with liver, pancreas and renal Then repeat 03/09/11 MRI abd: on this noted LLL nodule seen.  Then CT Chest and now referred. ? Of nodule in 2009.   ? Vascular in 2009 and went away later.  Notes some dyspnea and cough for 1.5 months. No fever, no true viral illness. Has been on ICS in the past but not helped enough to warrant maintainence   Shortness of Breath The current episode started more than 1 month ago. The problem occurs daily (exertional). Associated symptoms include headaches, rhinorrhea, a sore throat and vomiting. Pertinent negatives include no abdominal pain, chest pain, ear pain, fever, hemoptysis, leg swelling, neck pain, rash or wheezing.  Cough This is a recurrent problem. The current episode started more than 1 month ago. The problem has been gradually improving. The cough is non-productive. Associated symptoms include headaches, heartburn, nasal congestion, postnasal drip, rhinorrhea, a sore throat and shortness of breath. Pertinent negatives include no chest pain, chills, ear pain, fever, hemoptysis, myalgias, rash, sweats, weight loss or wheezing. The symptoms are aggravated by cold air and exercise.      Past Medical History  Diagnosis Date  . Allergic rhinitis   . History of colonic polyps   . Arthritis   . Reflux     cough  . Cystocele     grade 2  . Rectocele     grade 2  . IBS (irritable bowel syndrome)   . Rosacea   . Sternal fracture     1986     Family History  Problem Relation Age of Onset  . Colon polyps  Father   . Hypertension Father   . Heart failure Father   . Melanoma Father   . Heart attack Father     x2  . Osteoporosis Mother   . Transient ischemic attack Mother   . Diabetes Paternal Grandmother   . Stroke Maternal Grandmother   . Stroke Maternal Grandfather      History   Social History  . Marital Status: Married    Spouse Name: N/A    Number of Children: N/A  . Years of Education: N/A   Occupational History  . Not on file.   Social History Main Topics  . Smoking status: Never Smoker   . Smokeless tobacco: Not on file  . Alcohol Use: No  . Drug Use: No  . Sexually Active: Not on file   Other Topics Concern  . Not on file   Social History Narrative  . No narrative on file     Allergies  Allergen Reactions  . Cefdinir     HIVES  . Celebrex (Celecoxib)     ABDOMINAL PAIN   . Ciprofloxacin     ABDOMINAL PAIN   . Codeine     NAUSEA VOMITING   . Decongestant     TACHYCARDIA   . Omnicef     HIVES     Outpatient Prescriptions Prior to Visit  Medication Sig Dispense Refill  . acetaminophen (TYLENOL) 325 MG tablet Take  650 mg by mouth every 6 (six) hours as needed.        . Bepotastine Besilate (BEPREVE) 1.5 % SOLN Place 1 drop into both eyes as needed.        . ciclesonide (OMNARIS) 50 MCG/ACT nasal spray Place 2 sprays into both nostrils daily.        Marland Kitchen esomeprazole (NEXIUM) 40 MG capsule Take 40 mg by mouth 2 (two) times daily.        . metoprolol (TOPROL-XL) 50 MG 24 hr tablet Take 50 mg by mouth daily.        . ranitidine (ZANTAC) 150 MG tablet Take 150 mg by mouth as needed.       . sucralfate (CARAFATE) 1 G tablet Take 1 g by mouth as needed.       . fexofenadine-pseudoephedrine (ALLEGRA-D 24) 180-240 MG per 24 hr tablet Take 1 tablet by mouth daily.            Review of Systems  Constitutional: Positive for fatigue. Negative for fever, chills, weight loss, diaphoresis, activity change, appetite change and unexpected weight change.  HENT:  Positive for congestion, sore throat, rhinorrhea, sneezing, dental problem, postnasal drip and sinus pressure. Negative for hearing loss, ear pain, nosebleeds, facial swelling, mouth sores, trouble swallowing, neck pain, neck stiffness, voice change, tinnitus and ear discharge.   Eyes: Positive for itching. Negative for photophobia, discharge and visual disturbance.  Respiratory: Positive for cough, choking and shortness of breath. Negative for apnea, hemoptysis, chest tightness, wheezing and stridor.   Cardiovascular: Negative for chest pain, palpitations and leg swelling.  Gastrointestinal: Positive for heartburn and vomiting. Negative for nausea, abdominal pain, constipation, blood in stool and abdominal distention.  Genitourinary: Negative for dysuria, urgency, frequency, hematuria, flank pain, decreased urine volume and difficulty urinating.  Musculoskeletal: Negative for myalgias, back pain, joint swelling, arthralgias and gait problem.  Skin: Negative for color change, pallor and rash.  Neurological: Positive for dizziness and headaches. Negative for tremors, seizures, syncope, speech difficulty, weakness, light-headedness and numbness.  Hematological: Negative for adenopathy. Bruises/bleeds easily.  Psychiatric/Behavioral: Negative for confusion, sleep disturbance and agitation. The patient is not nervous/anxious.        Objective:   Physical Exam  Filed Vitals:   03/29/11 1551  BP: 140/100  Pulse: 81  Temp: 98.1 F (36.7 C)  TempSrc: Oral  Height: 5\' 1"  (1.549 m)  Weight: 77.111 kg (170 lb)  SpO2: 98%    Gen: Pleasant, well-nourished, in no distress,  normal affect  ENT: No lesions,  mouth clear,  oropharynx clear, no postnasal drip  Neck: No JVD, no TMG, no carotid bruits  Lungs: No use of accessory muscles, no dullness to percussion, mild pseudowheeze  Cardiovascular: RRR, heart sounds normal, no murmur or gallops, no peripheral edema  Abdomen: soft and NT, no HSM,   BS normal  Musculoskeletal: No deformities, no cyanosis or clubbing  Neuro: alert, non focal  Skin: Warm, no lesions or rashes  CT Chest 03/12/11 Findings: Esophageal stent has been removed since previous study.  A small hiatal hernia is again demonstrated. No mediastinal masses  or lymphadenopathy identified. No adenopathy seen elsewhere within  the thorax. Shotty bilateral axillary lymph nodes remain stable.  Left pleural effusion has resolved since previous study. Mild  scarring seen in the medial left lower lobe. A focal area of  ground-glass opacity is seen in the posterior left lower lobe which  measures 2.4 x 2.1 cm on image 40. Differential diagnosis includes  low grade adenocarcinoma and inflammatory or infectious process.  Images through the upper abdomen shows hepatic steatosis. A 1.4 cm  hyperdense cyst is seen in the upper pole of the right kidney as  shown on recent MRI. A small cystic lesion adjacent to the  pancreatic head also remains stable. Both adrenal glands are  normal in appearance.  IMPRESSION:  1. Resolution of left pleural effusion since previous study.  2. 2.4 cm ground-glass opacity in the posterior left lower lobe.  Differential diagnosis includes low grade adenocarcinoma and  inflammatory or infectious processes. Follow-up by chest CT is  recommended in 3-6 months.  3. No evidence of solid mass, nodule or lymphadenopathy.  4. Small hiatal hernia and hepatic steatosis.        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 Cyclical cough also d/t LPR.   Plan  Repeat CT in 3 months and track patient at that time No specific Rx for cyclical cough  All questions answered from the pt    Updated Medication List Outpatient Encounter Prescriptions as of 03/29/2011    Medication Sig Dispense Refill  . acetaminophen (TYLENOL) 325 MG tablet Take 650 mg by mouth every 6 (six) hours as needed.        . Bepotastine Besilate (BEPREVE) 1.5 % SOLN Place 1 drop into both eyes as needed.        . ciclesonide (OMNARIS) 50 MCG/ACT nasal spray Place 2 sprays into both nostrils daily.        Marland Kitchen esomeprazole (NEXIUM) 40 MG capsule Take 40 mg by mouth 2 (two) times daily.        . fexofenadine (ALLEGRA) 180 MG tablet Take 180 mg by mouth daily.        . metoprolol (TOPROL-XL) 50 MG 24 hr tablet Take 50 mg by mouth daily.        . ranitidine (ZANTAC) 150 MG tablet Take 150 mg by mouth as needed.       . sucralfate (CARAFATE) 1 G tablet Take 1 g by mouth as needed.       Marland Kitchen DISCONTD: fexofenadine-pseudoephedrine (ALLEGRA-D 24) 180-240 MG per 24 hr tablet Take 1 tablet by mouth daily.

## 2011-03-30 DIAGNOSIS — R911 Solitary pulmonary nodule: Secondary | ICD-10-CM | POA: Insufficient documentation

## 2011-03-30 NOTE — Assessment & Plan Note (Signed)
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 Cyclical cough also d/t LPR.   Plan  Repeat CT in 3 months and track patient at that time No specific Rx for cyclical cough  All questions answered from the pt

## 2011-04-13 ENCOUNTER — Institutional Professional Consult (permissible substitution): Payer: 59 | Admitting: Internal Medicine

## 2011-06-11 ENCOUNTER — Other Ambulatory Visit: Payer: 59

## 2011-06-27 ENCOUNTER — Telehealth: Payer: Self-pay | Admitting: Critical Care Medicine

## 2011-06-27 NOTE — Telephone Encounter (Signed)
PEW not in office in [place] on 07-12-11 > appt needs to be rescheduled for another [place] day.  LMOM TCB x1 at both the home and cell phone numbers TCB.

## 2011-07-02 ENCOUNTER — Telehealth: Payer: Self-pay | Admitting: Critical Care Medicine

## 2011-07-02 NOTE — Telephone Encounter (Signed)
This patient needs a CT Chest rescheduled. Follow up lung nodule

## 2011-07-02 NOTE — Telephone Encounter (Signed)
Dr. Delford Field, do you want the CT Chest with or without contrast?

## 2011-07-02 NOTE — Telephone Encounter (Signed)
Message copied by Storm Frisk on Mon Jul 02, 2011  9:21 AM ------      Message from: Shan Levans E      Created: Thu Mar 29, 2011  4:16 PM      Regarding: CT        Check CT chest

## 2011-07-02 NOTE — Telephone Encounter (Signed)
Without contrast

## 2011-07-02 NOTE — Telephone Encounter (Signed)
Per pt's chart, she already has a CT Chest without contrast scheduled for 07/06/11.  I called the CT Dept, spoke with Rose who verified this is correct -- CT Chest without contrast is scheduled for 07/06/11.

## 2011-07-06 ENCOUNTER — Other Ambulatory Visit: Payer: 59

## 2011-07-06 ENCOUNTER — Ambulatory Visit (HOSPITAL_COMMUNITY)
Admission: RE | Admit: 2011-07-06 | Discharge: 2011-07-06 | Disposition: A | Discharge status: Home / Self Care | Payer: 59 | Source: Ambulatory Visit | Attending: Critical Care Medicine | Admitting: Critical Care Medicine

## 2011-07-06 ENCOUNTER — Telehealth: Payer: Self-pay | Admitting: Critical Care Medicine

## 2011-07-06 DIAGNOSIS — R911 Solitary pulmonary nodule: Secondary | ICD-10-CM

## 2011-07-06 DIAGNOSIS — K2289 Other specified disease of esophagus: Secondary | ICD-10-CM | POA: Insufficient documentation

## 2011-07-06 DIAGNOSIS — K228 Other specified diseases of esophagus: Secondary | ICD-10-CM | POA: Insufficient documentation

## 2011-07-06 DIAGNOSIS — K449 Diaphragmatic hernia without obstruction or gangrene: Secondary | ICD-10-CM | POA: Insufficient documentation

## 2011-07-06 NOTE — Telephone Encounter (Signed)
CT Chest negative for any nodules No further pulmonary f/u needed

## 2011-07-12 ENCOUNTER — Ambulatory Visit: Payer: 59 | Admitting: Critical Care Medicine

## 2011-07-12 ENCOUNTER — Other Ambulatory Visit: Payer: 59

## 2011-07-17 NOTE — Telephone Encounter (Signed)
Called spoke with pt's husband to rsc pt's 4.18.13 with PEW.  Per pt's husband, PEW discussed 4.12.13 CT results with patient and stated that follow up was not needed.  Will sign off.

## 2011-07-26 ENCOUNTER — Ambulatory Visit: Payer: 59 | Admitting: Critical Care Medicine

## 2011-08-07 IMAGING — CT CT CHEST W/O CM
2 of 3 series · 15 of 36 positions shown, 18 images · IV contrast (APPLIED)
Comparison: Esophagram 02/20/2010 and CT chest 02/09/2010

***ADDENDUM*** CREATED: 02/27/2010 [DATE]

The patient was indeed given oral contrast, however, most of the
contrast is in the stomach and is very dilute.  Minimal, if any,
oral contrast is seen in the distal esophagus, making evaluation
for esophageal leak difficult.  Esophagram may be helpful in
further evaluation.  This was discussed with Dr. Tanu at [DATE]
p.m. on 02/27/2010.
***END ADDENDUM*** SIGNED BY: Kiefer Henninger, M.D.
CLINICAL DATA: Dysphagia, history of esophageal rupture with stent
placement.  Evaluate for leak.
CT CHEST WITHOUT CONTRAST
TECHNIQUE: Multidetector CT imaging of the chest was performed
following the standard protocol without IV contrast.

[Series 2: routine chest 5.0 st · axial · 0.62mm/px · z∈[-208,+22]mm · 12 of 55 slices shown, 15 images]
[im 5/55  mediastinal]
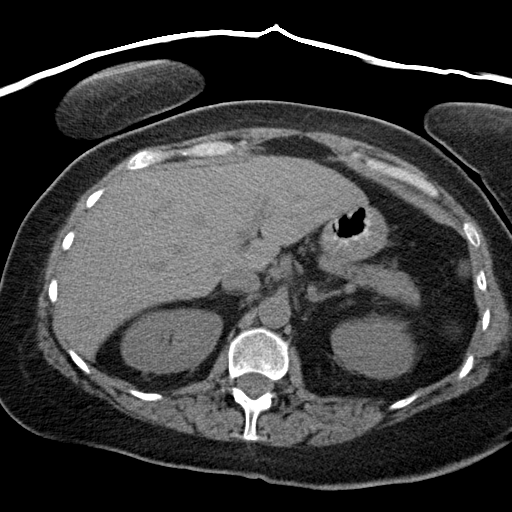
[im 5/55  lung]
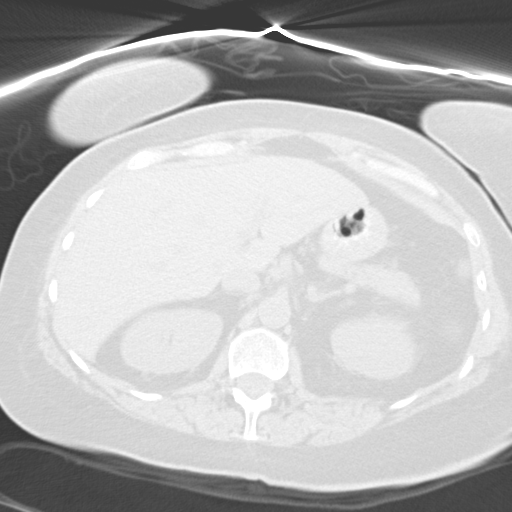
[im 9/55  lung]
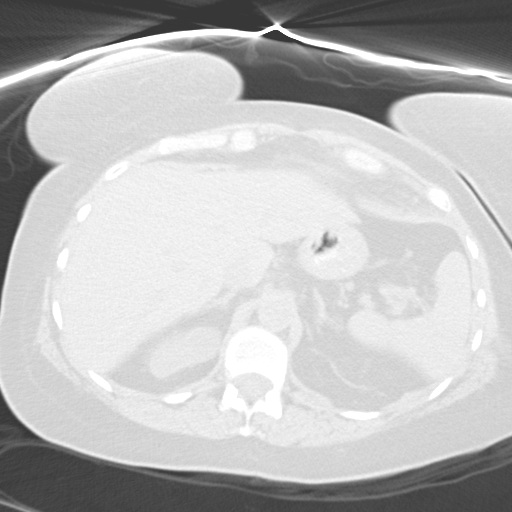
[im 13/55  lung]
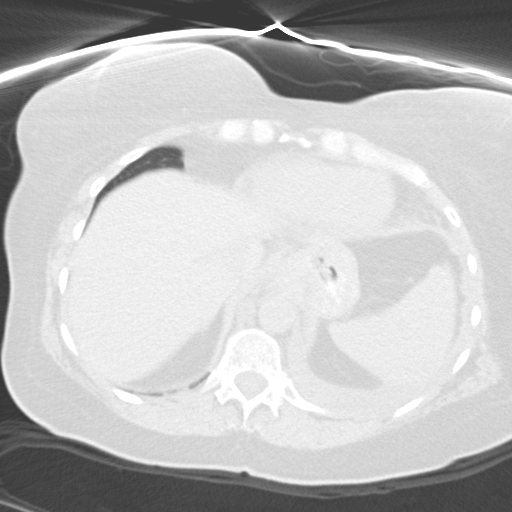
[im 17/55  lung]
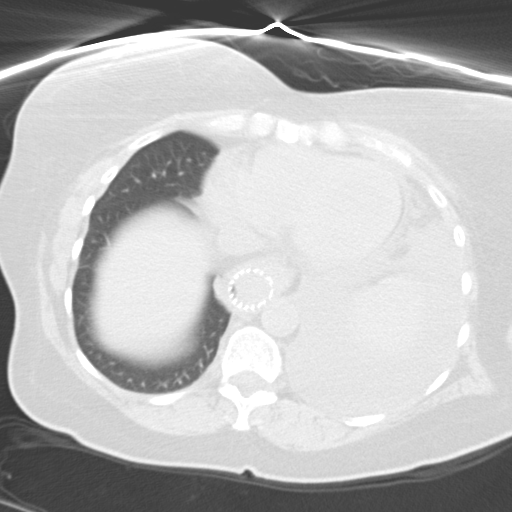
[im 21/55  mediastinal]
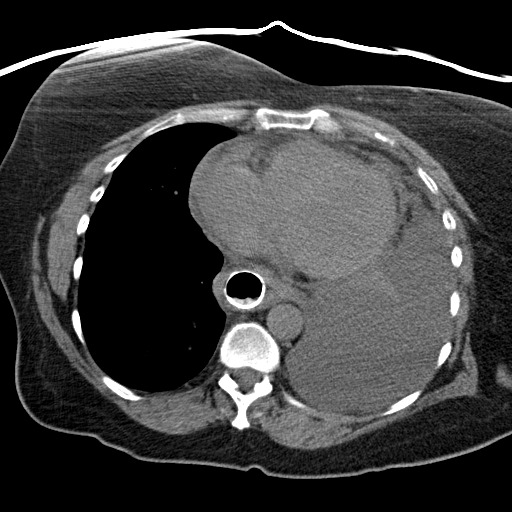
[im 21/55  lung]
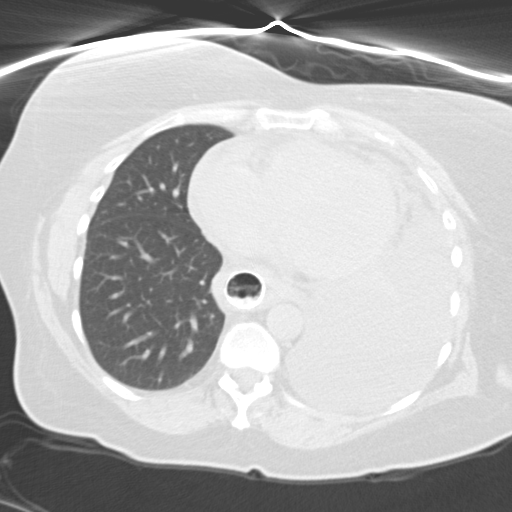
[im 25/55  lung]
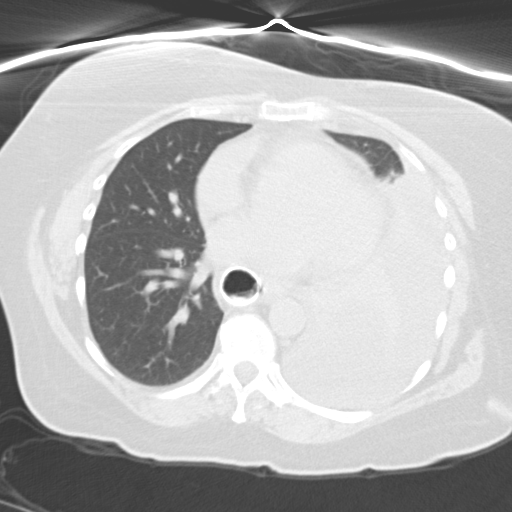
[im 31/55  lung]
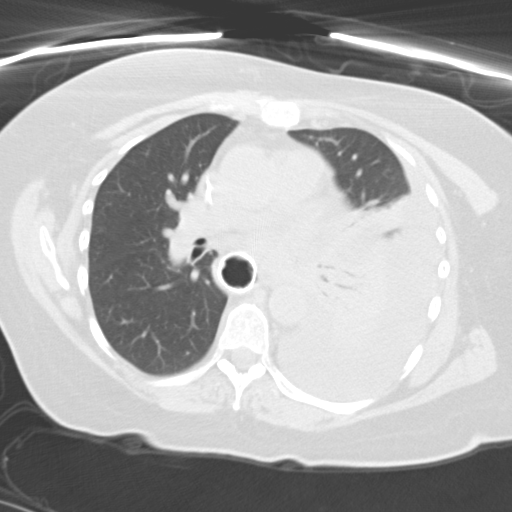
[im 35/55  lung]
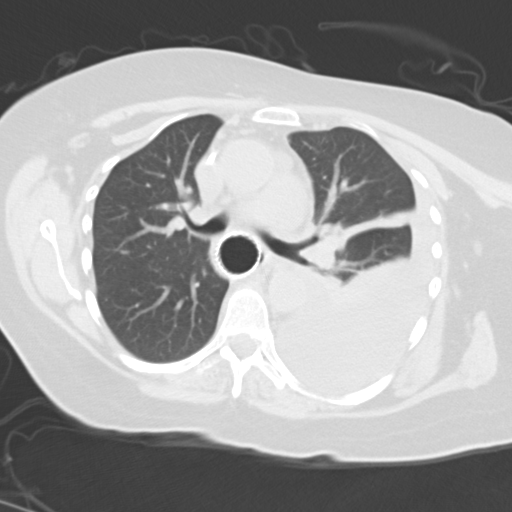
[im 39/55  mediastinal]
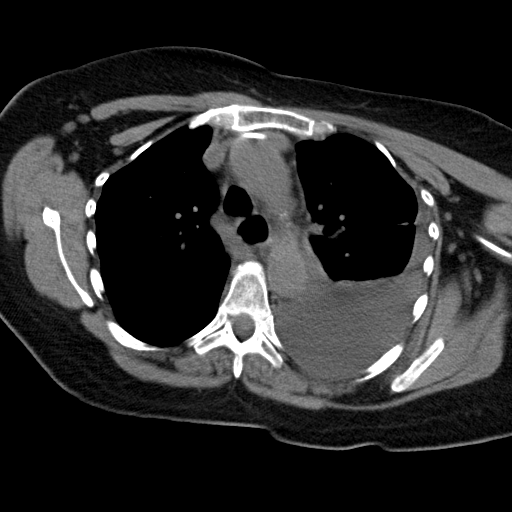
[im 39/55  lung]
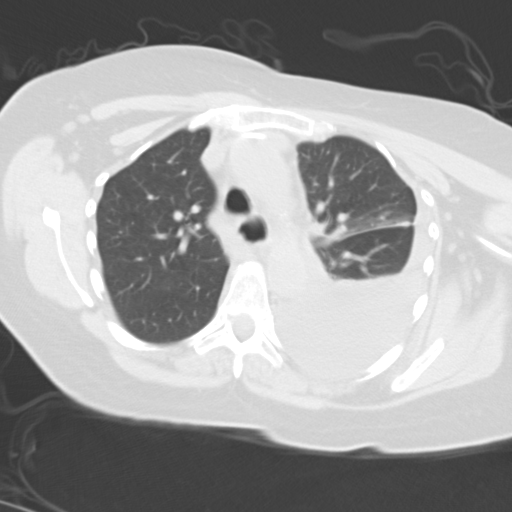
[im 43/55  lung]
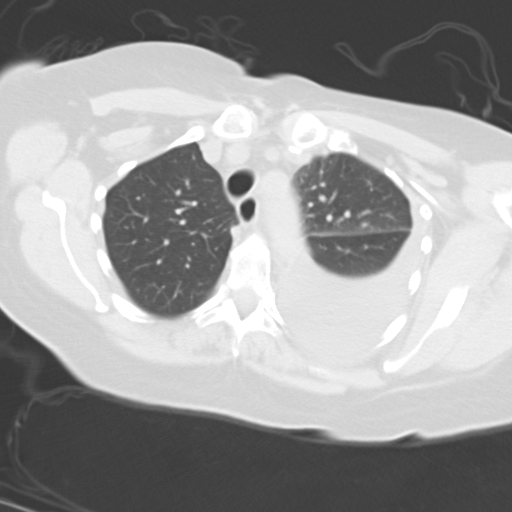
[im 47/55  lung]
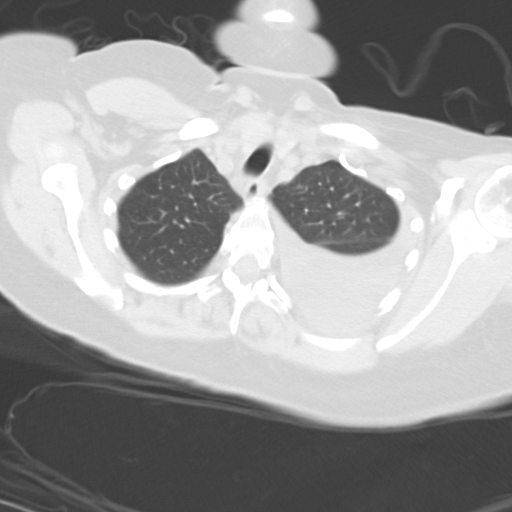
[im 51/55  lung]
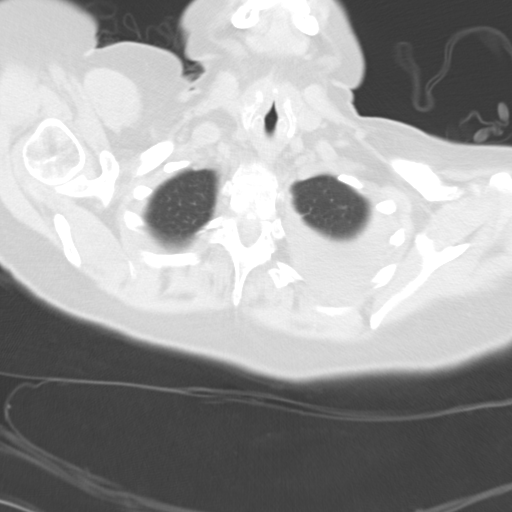

[Series 602: coronals · coronal · 0.62mm/px · 3 of 74 slices shown]
[im 15/74  lung]
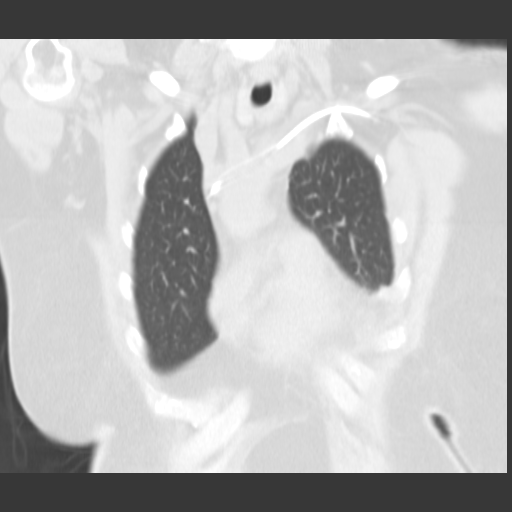
[im 30/74  lung]
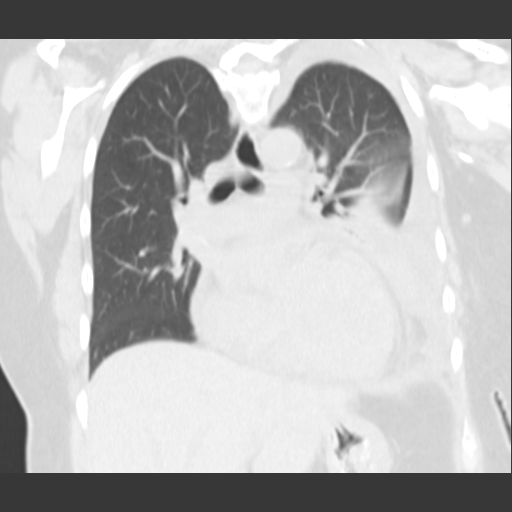
[im 44/74  lung]
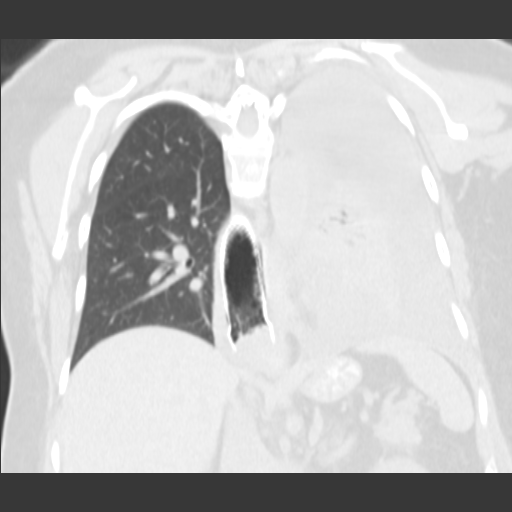

[15 of 36 positions shown; findings below may reference images not displayed]

FINDINGS: There may be a sub centimeter low attenuation nodule in
the left thyroid, which is nonspecific.  No pathologically enlarged
mediastinal or axillary lymph nodes.  Left PICC terminates in the
SVC.  Heart size normal.  No pericardial effusion.

A wall stent is seen in the lower esophagus, and is more proximally
positioned than on the 02/09/2010.  Definitive exclusion of a leak
is difficult without the benefit of oral contrast, as on
02/09/2010.  A 10 mm retrocrural lymph node is stable.

Large left pleural effusion with mild loculation.
Collapse/consolidation in the lingula and left lower lobe.
Findings have worsened from 02/09/2010.  Right pleural effusion and
right lower lobe air space disease have resolved in the interval.
Airway is unremarkable.

Incidental imaging of the upper abdomen shows a probable 2.2 cm
exophytic cyst off the upper pole right kidney.  No worrisome lytic
or sclerotic lesions.
IMPRESSION: 1.  Esophageal wall stent is more proximally positioned than on
02/09/2010.
2.  Interval enlargement of a partially loculated left pleural
effusion with slight worsening in associated collapse/consolidation
in the lingula and left lower lobe.  Evaluation for esophageal leak
is hindered by the lack of oral contrast.  Because these secondary
findings can be seen with esophageal leak, a leak therefore cannot
be excluded.  Esophagram may be helpful in further evaluation, as
clinically indicated.

## 2011-08-10 ENCOUNTER — Telehealth: Payer: Self-pay | Admitting: Critical Care Medicine

## 2011-08-10 NOTE — Telephone Encounter (Signed)
done

## 2012-06-24 ENCOUNTER — Other Ambulatory Visit (HOSPITAL_COMMUNITY): Payer: Self-pay | Admitting: Internal Medicine

## 2012-06-24 DIAGNOSIS — Z139 Encounter for screening, unspecified: Secondary | ICD-10-CM

## 2012-07-11 ENCOUNTER — Ambulatory Visit (HOSPITAL_COMMUNITY)
Admission: RE | Admit: 2012-07-11 | Discharge: 2012-07-11 | Disposition: A | Discharge status: Home / Self Care | Payer: 59 | Source: Ambulatory Visit | Attending: Internal Medicine | Admitting: Internal Medicine

## 2012-07-11 ENCOUNTER — Other Ambulatory Visit (HOSPITAL_COMMUNITY): Payer: 59

## 2012-07-11 ENCOUNTER — Ambulatory Visit (HOSPITAL_COMMUNITY): Payer: 59

## 2012-07-11 DIAGNOSIS — Z780 Asymptomatic menopausal state: Secondary | ICD-10-CM | POA: Insufficient documentation

## 2012-07-11 DIAGNOSIS — Z139 Encounter for screening, unspecified: Secondary | ICD-10-CM

## 2012-07-11 DIAGNOSIS — Z13820 Encounter for screening for osteoporosis: Secondary | ICD-10-CM | POA: Insufficient documentation

## 2012-07-11 DIAGNOSIS — Z1231 Encounter for screening mammogram for malignant neoplasm of breast: Secondary | ICD-10-CM | POA: Insufficient documentation

## 2012-07-11 LAB — HM MAMMOGRAPHY: HM MAMMO: NORMAL

## 2012-07-11 LAB — HM DEXA SCAN

## 2013-02-06 ENCOUNTER — Other Ambulatory Visit (HOSPITAL_COMMUNITY): Payer: Self-pay | Admitting: Gastroenterology

## 2013-02-06 DIAGNOSIS — K862 Cyst of pancreas: Secondary | ICD-10-CM

## 2013-02-17 ENCOUNTER — Ambulatory Visit (HOSPITAL_COMMUNITY)
Admission: RE | Admit: 2013-02-17 | Discharge: 2013-02-17 | Disposition: A | Discharge status: Home / Self Care | Payer: 59 | Source: Ambulatory Visit | Attending: Gastroenterology | Admitting: Gastroenterology

## 2013-02-17 DIAGNOSIS — N281 Cyst of kidney, acquired: Secondary | ICD-10-CM | POA: Insufficient documentation

## 2013-02-17 DIAGNOSIS — K449 Diaphragmatic hernia without obstruction or gangrene: Secondary | ICD-10-CM | POA: Insufficient documentation

## 2013-02-17 DIAGNOSIS — K869 Disease of pancreas, unspecified: Secondary | ICD-10-CM | POA: Insufficient documentation

## 2013-02-17 DIAGNOSIS — G589 Mononeuropathy, unspecified: Secondary | ICD-10-CM | POA: Insufficient documentation

## 2013-02-17 DIAGNOSIS — K862 Cyst of pancreas: Secondary | ICD-10-CM

## 2013-02-17 LAB — CREATININE, SERUM: Creatinine, Ser: 0.76 mg/dL (ref 0.50–1.10)

## 2013-02-17 MED ORDER — GADOBENATE DIMEGLUMINE 529 MG/ML IV SOLN
15.0000 mL | Freq: Once | INTRAVENOUS | Status: AC
  Administered 2013-02-17: 15 mL via INTRAVENOUS

## 2013-04-23 ENCOUNTER — Emergency Department (HOSPITAL_BASED_OUTPATIENT_CLINIC_OR_DEPARTMENT_OTHER): Payer: 59

## 2013-04-23 ENCOUNTER — Observation Stay (HOSPITAL_COMMUNITY): Payer: 59

## 2013-04-23 ENCOUNTER — Encounter (HOSPITAL_BASED_OUTPATIENT_CLINIC_OR_DEPARTMENT_OTHER): Payer: Self-pay | Admitting: Emergency Medicine

## 2013-04-23 ENCOUNTER — Observation Stay (HOSPITAL_BASED_OUTPATIENT_CLINIC_OR_DEPARTMENT_OTHER)
Admission: EM | Admit: 2013-04-23 | Discharge: 2013-04-25 | Disposition: A | Discharge status: Home / Self Care | Payer: 59 | Attending: Internal Medicine | Admitting: Internal Medicine

## 2013-04-23 DIAGNOSIS — K589 Irritable bowel syndrome without diarrhea: Secondary | ICD-10-CM | POA: Insufficient documentation

## 2013-04-23 DIAGNOSIS — R911 Solitary pulmonary nodule: Secondary | ICD-10-CM

## 2013-04-23 DIAGNOSIS — Z860100 Personal history of colon polyps, unspecified: Secondary | ICD-10-CM | POA: Insufficient documentation

## 2013-04-23 DIAGNOSIS — R51 Headache: Secondary | ICD-10-CM

## 2013-04-23 DIAGNOSIS — M1990 Unspecified osteoarthritis, unspecified site: Secondary | ICD-10-CM

## 2013-04-23 DIAGNOSIS — R079 Chest pain, unspecified: Principal | ICD-10-CM | POA: Insufficient documentation

## 2013-04-23 DIAGNOSIS — R519 Headache, unspecified: Secondary | ICD-10-CM | POA: Diagnosis present

## 2013-04-23 DIAGNOSIS — J309 Allergic rhinitis, unspecified: Secondary | ICD-10-CM

## 2013-04-23 DIAGNOSIS — K449 Diaphragmatic hernia without obstruction or gangrene: Secondary | ICD-10-CM

## 2013-04-23 DIAGNOSIS — IMO0001 Reserved for inherently not codable concepts without codable children: Secondary | ICD-10-CM

## 2013-04-23 DIAGNOSIS — K219 Gastro-esophageal reflux disease without esophagitis: Secondary | ICD-10-CM | POA: Insufficient documentation

## 2013-04-23 DIAGNOSIS — N8111 Cystocele, midline: Secondary | ICD-10-CM | POA: Insufficient documentation

## 2013-04-23 DIAGNOSIS — I10 Essential (primary) hypertension: Secondary | ICD-10-CM | POA: Diagnosis present

## 2013-04-23 DIAGNOSIS — L719 Rosacea, unspecified: Secondary | ICD-10-CM | POA: Insufficient documentation

## 2013-04-23 HISTORY — DX: Gastro-esophageal reflux disease without esophagitis: K21.9

## 2013-04-23 HISTORY — DX: Essential (primary) hypertension: I10

## 2013-04-23 LAB — URINE MICROSCOPIC-ADD ON

## 2013-04-23 LAB — COMPREHENSIVE METABOLIC PANEL
ALT: 16 U/L (ref 0–35)
AST: 18 U/L (ref 0–37)
Albumin: 4.1 g/dL (ref 3.5–5.2)
Alkaline Phosphatase: 69 U/L (ref 39–117)
BILIRUBIN TOTAL: 0.3 mg/dL (ref 0.3–1.2)
BUN: 17 mg/dL (ref 6–23)
CALCIUM: 9.6 mg/dL (ref 8.4–10.5)
CO2: 25 mEq/L (ref 19–32)
CREATININE: 0.8 mg/dL (ref 0.50–1.10)
Chloride: 101 mEq/L (ref 96–112)
GFR, EST AFRICAN AMERICAN: 90 mL/min — AB (ref >90)
GFR, EST NON AFRICAN AMERICAN: 77 mL/min — AB (ref >90)
GLUCOSE: 93 mg/dL (ref 70–99)
Potassium: 4.3 mEq/L (ref 3.7–5.3)
Sodium: 139 mEq/L (ref 137–147)
Total Protein: 7.8 g/dL (ref 6.0–8.3)

## 2013-04-23 LAB — CBC WITH DIFFERENTIAL/PLATELET
BASOS PCT: 0 % (ref 0–1)
Basophils Absolute: 0 10*3/uL (ref 0.0–0.1)
EOS ABS: 0.1 10*3/uL (ref 0.0–0.7)
Eosinophils Relative: 1 % (ref 0–5)
HEMATOCRIT: 37.2 % (ref 36.0–46.0)
HEMOGLOBIN: 12.1 g/dL (ref 12.0–15.0)
LYMPHS ABS: 1.6 10*3/uL (ref 0.7–4.0)
Lymphocytes Relative: 26 % (ref 12–46)
MCH: 27.8 pg (ref 26.0–34.0)
MCHC: 32.5 g/dL (ref 30.0–36.0)
MCV: 85.3 fL (ref 78.0–100.0)
MONO ABS: 0.6 10*3/uL (ref 0.1–1.0)
MONOS PCT: 10 % (ref 3–12)
Neutro Abs: 3.8 10*3/uL (ref 1.7–7.7)
Neutrophils Relative %: 63 % (ref 43–77)
Platelets: 251 10*3/uL (ref 150–400)
RBC: 4.36 MIL/uL (ref 3.87–5.11)
RDW: 14.1 % (ref 11.5–15.5)
WBC: 6 10*3/uL (ref 4.0–10.5)

## 2013-04-23 LAB — CBC
HCT: 34.9 % — ABNORMAL LOW (ref 36.0–46.0)
Hemoglobin: 11.3 g/dL — ABNORMAL LOW (ref 12.0–15.0)
MCH: 27.2 pg (ref 26.0–34.0)
MCHC: 32.4 g/dL (ref 30.0–36.0)
MCV: 84.1 fL (ref 78.0–100.0)
PLATELETS: 226 10*3/uL (ref 150–400)
RBC: 4.15 MIL/uL (ref 3.87–5.11)
RDW: 14.4 % (ref 11.5–15.5)
WBC: 6.2 10*3/uL (ref 4.0–10.5)

## 2013-04-23 LAB — URINALYSIS, ROUTINE W REFLEX MICROSCOPIC
BILIRUBIN URINE: NEGATIVE
Glucose, UA: NEGATIVE mg/dL
Ketones, ur: NEGATIVE mg/dL
Leukocytes, UA: NEGATIVE
NITRITE: NEGATIVE
PROTEIN: NEGATIVE mg/dL
SPECIFIC GRAVITY, URINE: 1.015 (ref 1.005–1.030)
Urobilinogen, UA: 0.2 mg/dL (ref 0.0–1.0)
pH: 6 (ref 5.0–8.0)

## 2013-04-23 LAB — TROPONIN I: Troponin I: <0.3 ng/mL (ref <0.30)

## 2013-04-23 LAB — LIPASE, BLOOD
LIPASE: 49 U/L (ref 11–59)
Lipase: 71 U/L — ABNORMAL HIGH (ref 11–59)

## 2013-04-23 LAB — CREATININE, SERUM
Creatinine, Ser: 0.75 mg/dL (ref 0.50–1.10)
GFR calc non Af Amer: 89 mL/min — ABNORMAL LOW (ref >90)

## 2013-04-23 LAB — D-DIMER, QUANTITATIVE: D-Dimer, Quant: 0.45 ug/mL-FEU (ref 0.00–0.48)

## 2013-04-23 LAB — MAGNESIUM: Magnesium: 1.9 mg/dL (ref 1.5–2.5)

## 2013-04-23 MED ORDER — OXYCODONE HCL 5 MG PO TABS
5.0000 mg | ORAL_TABLET | ORAL | Status: DC | PRN
  Administered 2013-04-24: 5 mg via ORAL
  Filled 2013-04-23 (×2): qty 1

## 2013-04-23 MED ORDER — PANTOPRAZOLE SODIUM 40 MG PO TBEC: 40.0000 mg | DELAYED_RELEASE_TABLET | Freq: Every day | ORAL | Status: DC

## 2013-04-23 MED ORDER — METOPROLOL TARTRATE 1 MG/ML IV SOLN
5.0000 mg | Freq: Three times a day (TID) | INTRAVENOUS | Status: DC
  Administered 2013-04-23 – 2013-04-24 (×2): 5 mg via INTRAVENOUS
  Filled 2013-04-23 (×5): qty 5

## 2013-04-23 MED ORDER — FAMOTIDINE IN NACL 20-0.9 MG/50ML-% IV SOLN
20.0000 mg | Freq: Once | INTRAVENOUS | Status: AC
  Administered 2013-04-24: 20 mg via INTRAVENOUS
  Filled 2013-04-23: qty 50

## 2013-04-23 MED ORDER — MORPHINE SULFATE 4 MG/ML IJ SOLN
4.0000 mg | Freq: Once | INTRAMUSCULAR | Status: AC
  Administered 2013-04-23: 4 mg via INTRAVENOUS
  Filled 2013-04-23: qty 1

## 2013-04-23 MED ORDER — HYDROMORPHONE HCL PF 1 MG/ML IJ SOLN
1.0000 mg | Freq: Once | INTRAMUSCULAR | Status: AC
  Administered 2013-04-23: 1 mg via INTRAVENOUS
  Filled 2013-04-23: qty 1

## 2013-04-23 MED ORDER — ACETAMINOPHEN 325 MG PO TABS
650.0000 mg | ORAL_TABLET | ORAL | Status: DC | PRN
  Administered 2013-04-24 – 2013-04-25 (×4): 650 mg via ORAL
  Filled 2013-04-23 (×4): qty 2

## 2013-04-23 MED ORDER — NITROGLYCERIN 0.4 MG SL SUBL: 0.4000 mg | SUBLINGUAL_TABLET | SUBLINGUAL | Status: DC | PRN

## 2013-04-23 MED ORDER — SODIUM CHLORIDE 0.9 % IV BOLUS (SEPSIS)
2000.0000 mL | Freq: Once | INTRAVENOUS | Status: AC
Start: 2013-04-23 — End: 2013-04-23
  Administered 2013-04-23: 2000 mL via INTRAVENOUS

## 2013-04-23 MED ORDER — ACETAMINOPHEN 325 MG PO TABS: 650.0000 mg | ORAL_TABLET | Freq: Four times a day (QID) | ORAL | Status: DC | PRN

## 2013-04-23 MED ORDER — MORPHINE SULFATE 2 MG/ML IJ SOLN: 2.0000 mg | INTRAMUSCULAR | Status: DC | PRN

## 2013-04-23 MED ORDER — SODIUM CHLORIDE 0.9 % IV SOLN
INTRAVENOUS | Status: AC
  Administered 2013-04-24: 11:00:00 via INTRAVENOUS

## 2013-04-23 MED ORDER — METOCLOPRAMIDE HCL 5 MG/ML IJ SOLN
5.0000 mg | Freq: Four times a day (QID) | INTRAMUSCULAR | Status: DC | PRN
  Filled 2013-04-23: qty 1

## 2013-04-23 MED ORDER — CICLESONIDE 50 MCG/ACT NA SUSP: 2.0000 | Freq: Every day | NASAL | Status: DC

## 2013-04-23 MED ORDER — SODIUM CHLORIDE 0.9 % IV BOLUS (SEPSIS): 1000.0000 mL | Freq: Once | INTRAVENOUS | Status: DC

## 2013-04-23 MED ORDER — GI COCKTAIL ~~LOC~~
30.0000 mL | Freq: Once | ORAL | Status: AC
  Administered 2013-04-23: 30 mL via ORAL
  Filled 2013-04-23: qty 30

## 2013-04-23 MED ORDER — ONDANSETRON HCL 4 MG/2ML IJ SOLN
4.0000 mg | Freq: Once | INTRAMUSCULAR | Status: AC
  Administered 2013-04-23: 4 mg via INTRAVENOUS
  Filled 2013-04-23: qty 2

## 2013-04-23 MED ORDER — GI COCKTAIL ~~LOC~~: 30.0000 mL | Freq: Three times a day (TID) | ORAL | Status: DC | PRN

## 2013-04-23 MED ORDER — ENOXAPARIN SODIUM 40 MG/0.4ML ~~LOC~~ SOLN
40.0000 mg | SUBCUTANEOUS | Status: DC
  Administered 2013-04-23 – 2013-04-24 (×2): 40 mg via SUBCUTANEOUS
  Filled 2013-04-23 (×3): qty 0.4

## 2013-04-23 MED ORDER — LORATADINE 10 MG PO TABS: 10.0000 mg | ORAL_TABLET | Freq: Every day | ORAL | Status: DC

## 2013-04-23 MED ORDER — DIPHENHYDRAMINE HCL 25 MG PO CAPS: 25.0000 mg | ORAL_CAPSULE | Freq: Four times a day (QID) | ORAL | Status: DC | PRN

## 2013-04-23 MED ORDER — PANTOPRAZOLE SODIUM 40 MG IV SOLR
40.0000 mg | INTRAVENOUS | Status: DC
  Administered 2013-04-23: 40 mg via INTRAVENOUS
  Filled 2013-04-23 (×2): qty 40

## 2013-04-23 MED ORDER — PANTOPRAZOLE SODIUM 40 MG IV SOLR
40.0000 mg | Freq: Once | INTRAVENOUS | Status: AC
  Administered 2013-04-23: 40 mg via INTRAVENOUS
  Filled 2013-04-23: qty 40

## 2013-04-23 MED ORDER — METOPROLOL SUCCINATE ER 50 MG PO TB24: 50.0000 mg | ORAL_TABLET | Freq: Every day | ORAL | Status: DC

## 2013-04-23 MED ORDER — ONDANSETRON 8 MG PO TBDP
8.0000 mg | ORAL_TABLET | Freq: Once | ORAL | Status: AC
  Administered 2013-04-23: 8 mg via ORAL

## 2013-04-23 MED ORDER — MORPHINE SULFATE 2 MG/ML IJ SOLN: 1.0000 mg | INTRAMUSCULAR | Status: DC | PRN

## 2013-04-23 MED ORDER — ONDANSETRON 8 MG PO TBDP
ORAL_TABLET | ORAL | Status: AC
  Filled 2013-04-23: qty 1

## 2013-04-23 MED ORDER — TRAMADOL HCL 50 MG PO TABS: 50.0000 mg | ORAL_TABLET | Freq: Four times a day (QID) | ORAL | Status: DC | PRN

## 2013-04-23 MED ORDER — FLUTICASONE PROPIONATE 50 MCG/ACT NA SUSP
2.0000 | Freq: Every day | NASAL | Status: DC
  Filled 2013-04-23: qty 16

## 2013-04-23 MED ORDER — ONDANSETRON HCL 4 MG/2ML IJ SOLN
4.0000 mg | Freq: Four times a day (QID) | INTRAMUSCULAR | Status: DC | PRN
Start: 2013-04-23 — End: 2013-04-25
  Administered 2013-04-23 – 2013-04-24 (×2): 4 mg via INTRAVENOUS
  Filled 2013-04-23 (×2): qty 2

## 2013-04-23 MED ORDER — SODIUM CHLORIDE 0.9 % IV SOLN
INTRAVENOUS | Status: AC
  Administered 2013-04-23: 15:00:00 via INTRAVENOUS

## 2013-04-23 NOTE — Progress Notes (Signed)
1/29 @ 2:40 pm.  Accepted call from [facility]. Ms. Geidel PCP is Dr. Randel Pigg.  The patient is an Therapist, sports.  CP rule out. Chest discomfort lasting 15 - 20 min.  Several episodes.  Most recent episode included SOB, Nausea and Pain that radiated down both of her arms.  BP was elevated with Systolic in 161W, but now down to normal.  Stress test in 2011 by Dr. Daneen Schick was negative. EKG currently normal. Troponin I x 1 is normal Labs stone cold normal. D-Dimer pending  Complicated GI history. Including IBS and esophageal rupture.  Being sent to team 10, cardiac tele, CP R/O for observation.   Imogene Burn, PA-C [facility] Pager: [phone]

## 2013-04-23 NOTE — ED Notes (Signed)
3 day history of pain in chest off and on felt it again this morning had pressure checked at work 178/100  Pain radiates down into both arms. Also nausea

## 2013-04-23 NOTE — ED Provider Notes (Signed)
CSN: [id number]     Arrival date & time 04/23/13  1052 History   First MD Initiated Contact with Patient 04/23/13 1138     Chief Complaint  Patient presents with  . chest pain elevated blood pressure    (Consider location/radiation/quality/duration/timing/severity/associated sxs/prior Treatment) HPI Comments: Patient endorses a three-day history of chest "discomfort" is in the center of her chest lasting for 15-20 minutes at a time. Feels like a pressure and then dropped her on her chest. It improves with rest. It is associated with nausea and shortness of breath. Today she noticed a while at work that lasted about 20 minutes and radiated to both arms. It is now resolved. She's not had any meds in the past. No cardiac history. Last stress test was in 2011 negative. She endorses a history of gradual onset headache that started today at the same time as the chest pain. Denies thunderclap onset. States compliance with her blood pressure medication. She took her blood pressure at work today was elevated over 170. She is a complicated history including rupture after EGD in 2011 that was repaired with endoscopy.  The history is provided by the patient and the spouse.    Past Medical History  Diagnosis Date  . Allergic rhinitis   . History of colonic polyps   . Arthritis   . Reflux     cough  . Cystocele     grade 2  . Rectocele     grade 2  . IBS (irritable bowel syndrome)   . Rosacea   . Sternal fracture     1986   Past Surgical History  Procedure Laterality Date  . Appendectomy  1989  . Cholecystectomy  1989  . Atypical nevus excision    . Hemorrhoid surgery  2005  . Left oophorectomy  6301    complicated by ureteral injuery requiring a ureteral stent   . Wisdom teeth removal  1964  . Total abdominal hysterectomy  2000  . Tubal ligation  1981  . Colonoscopy    . Esophagogastroduodenoscopy    . Mmk / anterior vesicourethropexy / urethropexy    . Cystoscopy w/ ureteral stent  removal  2011   Family History  Problem Relation Age of Onset  . Colon polyps Father   . Hypertension Father   . Heart failure Father   . Melanoma Father   . Heart attack Father     x2  . Osteoporosis Mother   . Transient ischemic attack Mother   . Diabetes Paternal Grandmother   . Stroke Maternal Grandmother   . Stroke Maternal Grandfather    History  Substance Use Topics  . Smoking status: Never Smoker   . Smokeless tobacco: Not on file  . Alcohol Use: No   OB History   Grav Para Term Preterm Abortions TAB SAB Ect Mult Living                 Review of Systems  Constitutional: Negative for fever, activity change and appetite change.  Eyes: Negative for visual disturbance.  Respiratory: Positive for chest tightness. Negative for cough and shortness of breath.   Gastrointestinal: Positive for nausea. Negative for vomiting and abdominal pain.  Genitourinary: Negative for dysuria, hematuria, vaginal bleeding and vaginal discharge.  Musculoskeletal: Negative for arthralgias and back pain.  Neurological: Positive for numbness and headaches. Negative for dizziness.  A complete 10 system review of systems was obtained and all systems are negative except as noted in the HPI and PMH.  Allergies  Cefdinir; Celebrex; Ciprofloxacin; Codeine; Omni-pac; and Pseudoephedrine hcl er  Home Medications   Current Outpatient Rx  Name  Route  Sig  Dispense  Refill  . acetaminophen (TYLENOL) 325 MG tablet   Oral   Take 650 mg by mouth every 6 (six) hours as needed.           . Bepotastine Besilate (BEPREVE) 1.5 % SOLN   Both Eyes   Place 1 drop into both eyes as needed.           . ciclesonide (OMNARIS) 50 MCG/ACT nasal spray   Each Nare   Place 2 sprays into both nostrils daily.           Marland Kitchen esomeprazole (NEXIUM) 40 MG capsule   Oral   Take 40 mg by mouth 2 (two) times daily.           . fexofenadine (ALLEGRA) 180 MG tablet   Oral   Take 180 mg by mouth daily.            . metoprolol (TOPROL-XL) 50 MG 24 hr tablet   Oral   Take 50 mg by mouth daily.           . ranitidine (ZANTAC) 150 MG tablet   Oral   Take 150 mg by mouth as needed.          . sucralfate (CARAFATE) 1 G tablet   Oral   Take 1 g by mouth as needed.           BP 137/79  Pulse 69  Resp 16  Ht 5\' 1"  (1.549 m)  Wt 158 lb (71.668 kg)  BMI 29.87 kg/m2  SpO2 97% Physical Exam  Constitutional: She is oriented to person, place, and time. She appears well-developed and well-nourished. No distress.  HENT:  Head: Normocephalic and atraumatic.  Mouth/Throat: Oropharynx is clear and moist. No oropharyngeal exudate.  Eyes: Conjunctivae and EOM are normal. Pupils are equal, round, and reactive to light.  Neck: Normal range of motion. Neck supple.  Cardiovascular: Normal rate, regular rhythm, normal heart sounds and intact distal pulses.   No murmur heard. Equal peripheral pulses  Pulmonary/Chest: Effort normal and breath sounds normal. No respiratory distress.  Abdominal: Soft. There is no tenderness. There is no rebound and no guarding.  Musculoskeletal: She exhibits no edema and no tenderness.  Neurological: She is alert and oriented to person, place, and time. No cranial nerve deficit. She exhibits normal muscle tone. Coordination normal.  CN 2-12 intact, no ataxia on finger to nose, no nystagmus, 5/5 strength throughout, no pronator drift, Romberg negative, normal gait.   Skin: Skin is warm.    ED Course  Procedures (including critical care time) Labs Review Labs Reviewed  LIPASE, BLOOD - Abnormal; Notable for the following:    Lipase 71 (*)    All other components within normal limits  COMPREHENSIVE METABOLIC PANEL - Abnormal; Notable for the following:    GFR calc non Af Amer 77 (*)    GFR calc Af Amer 90 (*)    All other components within normal limits  URINALYSIS, ROUTINE W REFLEX MICROSCOPIC - Abnormal; Notable for the following:    Hgb urine dipstick TRACE  (*)    All other components within normal limits  CBC WITH DIFFERENTIAL  TROPONIN I  D-DIMER, QUANTITATIVE  URINE MICROSCOPIC-ADD ON  TROPONIN I   Imaging Review Dg Chest 2 View  04/23/2013   CLINICAL DATA:  Chest pain.  EXAM:  CHEST  2 VIEW  COMPARISON:  04/10/2010  FINDINGS: The heart size and mediastinal contours are within normal limits. Both lungs are clear. The visualized skeletal structures are unremarkable.  IMPRESSION: Normal exam.   Electronically Signed   By: Rozetta Nunnery M.D.   On: 04/23/2013 12:36   Ct Head Wo Contrast  04/23/2013   CLINICAL DATA:  Headache and dizziness for 2 days.  EXAM: CT HEAD WITHOUT CONTRAST  TECHNIQUE: Contiguous axial images were obtained from the base of the skull through the vertex without intravenous contrast.  COMPARISON:  CT scan dated 02/14/2010  FINDINGS: No mass lesion. No midline shift. No acute hemorrhage or hematoma. No extra-axial fluid collections. No evidence of acute infarction. Brain parenchyma is normal. No osseous abnormality.  IMPRESSION: Normal exam, unchanged.   Electronically Signed   By: Rozetta Nunnery M.D.   On: 04/23/2013 12:37    EKG Interpretation    Date/Time:  Thursday April 23 2013 11:05:33 EST Ventricular Rate:  71 PR Interval:  188 QRS Duration: 80 QT Interval:  398 QTC Calculation: 432 R Axis:   54 Text Interpretation:  Normal sinus rhythm Normal ECG No previous ECGs available Confirmed by Pennye Beeghly  MD, Lecretia Buczek ([id number]) on 04/23/2013 11:58:19 AM            MDM   1. Chest pain    3 day History of discomfort in his chest that comes and goes lasting a few minutes at a time. Sister with shortness of breath and nausea. No cardiac history.  EKG is normal sinus rhythm. Chest x-ray is negative.. She has equal upper extremity blood pressures and equal pulses.   Low suspicion for PE or dissection. Patient with extensive esophageal and GERD history which is likely the source of her discomfort.  She complains of  ongoing pain and pain in her chest. it is coming and going throughout her stay in the ED. She is agreeable to observation admission to rule out cardiac causes of her chest pain.  Her headache is gradual onset and not consistent with subarachnoid hemorrhage. Her blood pressure has improved in the ED.     Ezequiel Essex, MD 04/23/13 1525

## 2013-04-23 NOTE — H&P (Signed)
[facility] History and Physical  Carrie Elliott MVH:[id number] DOB: 1951/02/19 DOA: 04/23/2013  Referring physician: Dr. Wyvonnia Dusky PCP: Maryjean Ka, MD   Chief Complaint: Chest pain  HPI: Carrie Elliott is a 63 y.o. 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 Patient's does state that 3 days prior to admission had a midsternal chest pain with some radiation to the right upper extremity with an aching like feeling. Patient still has had some shoulder problems with injections in the past. Patient states 2 days prior to admission she had some chest discomfort while at work also noted to have a headache blood pressure that time was 130/84 was given some Tylenol with some improvement. Patient states on the day of admission while at work while trying to get medications at the refrigerator had some midsternal chest discomfort with also a bandlike feeling. Patient also with complaints of a headache that started at the back of her head and moves up bilaterally to her temples. Patient did state that had some numbness to the left fourth and fifth fingers as well. Patient said that had likely check her blood pressure at work and was elevated at 178/100. Patient does endorse some shortness of breath on climbing stairs recently. Patient denies any fever, no chills, no diaphoresis, no dysuria, no palpitations, no changes no shortness of breath, no abdominal pain. Patient does endorse some nausea and a couple episodes of emesis after transport to the hospital. Patient states  was a GI cocktail and morphine which helped the chest pain. Patient also with complaints of headache. Patient was seen in the ED EKG which was done had a normal sinus rhythm. 2 sets of troponin which were done were negative. CBC WNL. Comprehensive metabolic profile which was done had a elevated lipase level of 71. Urinalysis which was done was negative. Chest x-ray which was done was negative. CT of the head which was done was negative for any acute abnormalities. We were called to admit the patient for further evaluation and management of the chest pain rule out.   Review of Systems: As per history of present illness otherwise negative. Constitutional:  No weight loss, night sweats, Fevers, chills, fatigue.  HEENT:  No headaches, Difficulty swallowing,Tooth/dental problems,Sore throat,  No sneezing, itching, ear ache, nasal congestion, post nasal drip,  Cardio-vascular:  No chest pain, Orthopnea, PND, swelling in lower extremities, anasarca, dizziness, palpitations  GI:  No heartburn, indigestion, abdominal pain, nausea, vomiting, diarrhea, change in bowel habits, loss of appetite  Resp:  No shortness of breath with exertion or at rest. No excess mucus, no productive cough, No non-productive cough, No coughing up of blood.No change in color of mucus.No wheezing.No chest wall deformity  Skin:  no rash or lesions.  GU:  no dysuria, change in color of urine, no urgency or frequency. No flank pain.  Musculoskeletal:  No joint pain or swelling. No decreased range of motion. No back pain.  Psych:  No change in mood or affect. No depression or anxiety. No memory loss.   Past Medical History  Diagnosis Date  . Allergic rhinitis   .  History of colonic polyps   . Arthritis   . Reflux     cough  . Cystocele     grade 2  . Rectocele     grade 2  . IBS (irritable bowel syndrome)   . Rosacea   . Sternal fracture     1986  . GERD (gastroesophageal reflux disease)   . HTN (hypertension)  04/23/2013   Past Surgical History  Procedure Laterality Date  . Appendectomy  1989  . Cholecystectomy  1989  . Atypical nevus excision    . Hemorrhoid surgery  2005  . Left oophorectomy  1610    complicated by ureteral injuery requiring a ureteral stent   . Wisdom teeth removal  1964  . Total abdominal hysterectomy  2000  . Tubal ligation  1981  . Colonoscopy    . Esophagogastroduodenoscopy    . Mmk / anterior vesicourethropexy / urethropexy    . Cystoscopy w/ ureteral stent removal  2011   Social History:  reports that she has never smoked. She does not have any smokeless tobacco history on file. She reports that she does not drink alcohol or use illicit drugs.  Allergies  Allergen Reactions  . Cefdinir     HIVES  . Celebrex [Celecoxib]     ABDOMINAL PAIN   . Ciprofloxacin     ABDOMINAL PAIN   . Codeine     NAUSEA VOMITING   . Omni-Pac     HIVES  . Pseudoephedrine Hcl Er     TACHYCARDIA     Family History  Problem Relation Age of Onset  . Colon polyps Father   . Hypertension Father   . Heart failure Father   . Melanoma Father   . Heart attack Father     x2  . Osteoporosis Mother   . Transient ischemic attack Mother   . Diabetes Paternal Grandmother   . Stroke Maternal Grandmother   . Stroke Maternal Grandfather      Prior to Admission medications   Medication Sig Start Date End Date Taking? Authorizing Provider  acetaminophen (TYLENOL) 325 MG tablet Take 650 mg by mouth every 6 (six) hours as needed.      Historical Provider, MD  Bepotastine Besilate (BEPREVE) 1.5 % SOLN Place 1 drop into both eyes as needed.      Historical Provider, MD  ciclesonide (OMNARIS) 50 MCG/ACT nasal spray Place 2 sprays into both nostrils daily.      Historical Provider, MD  esomeprazole (NEXIUM) 40 MG capsule Take 40 mg by mouth 2 (two) times daily.      Historical Provider, MD  fexofenadine (ALLEGRA) 180 MG tablet Take 180 mg by mouth daily.      Historical Provider, MD   metoprolol (TOPROL-XL) 50 MG 24 hr tablet Take 50 mg by mouth daily.      Historical Provider, MD  ranitidine (ZANTAC) 150 MG tablet Take 150 mg by mouth as needed.     Historical Provider, MD  sucralfate (CARAFATE) 1 G tablet Take 1 g by mouth as needed.     Historical Provider, MD   Physical Exam: Filed Vitals:   04/23/13 1647  BP: 136/65  Pulse: 68  Temp: 97.8 F (36.6 C)  Resp: 18    BP 136/65  Pulse 68  Temp(Src) 97.8 F (36.6 C) (Oral)  Resp 18  Ht 5\' 1"  (1.549 m)  Wt 71.668 kg (158 lb)  BMI 29.87 kg/m2  SpO2 95%  General:  Appears calm and comfortable.  Well-developed well-nourished in no acute cardiopulmonary distress. Eyes: PERRLA, EOMI, normal lids, irises & conjunctiva ENT: grossly normal hearing, lips & tongue Neck: no LAD, masses or thyromegaly Cardiovascular: RRR, no m/r/g. No LE edema. Chest wall is nontender to palpation. Respiratory: CTA bilaterally, no w/r/r. Normal respiratory effort. Abdomen: soft, ntnd, positive bowel sounds. Skin: no rash or induration seen on limited exam Musculoskeletal: grossly normal tone BUE/BLE Psychiatric: grossly normal mood and affect, speech fluent and appropriate Neurologic: Alert and oriented x3. Cranial nerves II through XII are grossly intact. Sensation is intact. Visual fields are intact. Gait not tested secondary to safety.          Labs on Admission:  Basic Metabolic Panel:  Recent Labs Lab 04/23/13 1130  NA 139  K 4.3  CL 101  CO2 25  GLUCOSE 93  BUN 17  CREATININE 0.80  CALCIUM 9.6   Liver Function Tests:  Recent Labs Lab 04/23/13 1130  AST 18  ALT 16  ALKPHOS 69  BILITOT 0.3  PROT 7.8  ALBUMIN 4.1    Recent Labs Lab 04/23/13 1130  LIPASE 71*   No results found for this basename: AMMONIA,  in the last 168 hours CBC:  Recent Labs Lab 04/23/13 1130  WBC 6.0  NEUTROABS 3.8  HGB 12.1  HCT 37.2  MCV 85.3  PLT 251   Cardiac Enzymes:  Recent Labs Lab 04/23/13 1130 04/23/13 1425   TROPONINI <0.30 <0.30    BNP (last 3 results) No results found for this basename: PROBNP,  in the last 8760 hours CBG: No results found for this basename: GLUCAP,  in the last 168 hours  Radiological Exams on Admission: Dg Chest 2 View  04/23/2013   CLINICAL DATA:  Chest pain.  EXAM: CHEST  2 VIEW  COMPARISON:  04/10/2010  FINDINGS: The heart size and mediastinal contours are within normal limits. Both lungs are clear. The visualized skeletal structures are unremarkable.  IMPRESSION: Normal exam.   Electronically Signed   By: Rozetta Nunnery M.D.   On: 04/23/2013 12:36   Ct Head Wo Contrast  04/23/2013   CLINICAL DATA:  Headache and dizziness for 2 days.  EXAM: CT HEAD WITHOUT CONTRAST  TECHNIQUE: Contiguous axial images were obtained from the base of the skull through the vertex without intravenous contrast.  COMPARISON:  CT scan dated 02/14/2010  FINDINGS: No mass lesion. No midline shift. No acute hemorrhage or hematoma. No extra-axial fluid collections. No evidence of acute infarction. Brain parenchyma is normal. No osseous abnormality.  IMPRESSION: Normal exam, unchanged.   Electronically Signed   By: Rozetta Nunnery M.D.   On: 04/23/2013 12:37    EKG: Independently reviewed. Normal sinus rhythm  Assessment/Plan Principal Problem:   Chest pain Active Problems:   Arthritis   Reflux   IBS (irritable bowel syndrome)   HTN (hypertension)   Headache(784.0)  #1 chest pain vs /??pancreatitis Patient is present which has been with both typical and atypical features. Differential includes acute coronary syndrome versus GI etiology vs pancreatitis. Doubt if this is PE as d-dimer is within normal limits. Doubt if a dissection as chest x-ray does not have any mediastinal widening and blood pressure was similar in both arms. Concern for possible acute pancreatitis as lipase levels are elevated at 71 versus upper GI issue. Patient with prior history of pancreatic pseudocyst. Patient's risk factors  include hypertension age and gender. Patient denies any history of diabetes, no history of stroke, no history of hyperlipidemia. Cardiac enzymes have  been negative x2. EKG with normal sinus rhythm. Will check a 2-D echo. Will continue home dose beta blocker. Patient stated a GI doctor told her she is unable to take an aspirin and a such will hold off on aspirin. Will make patient n.p.o. Will get an abdominal ultrasound to rule out gallstone pancreatitis. Repeat lipase levels in the morning. Hydrate with IV fluids. Anti-emetics. Pain management. Supportive care. Follow. If 2-D echo is abnormal will likely need a cardiology consultation. If cardiac workup is negative and no improvement after treatment for acute pancreatitis there may consider a GI evaluation. Will follow for now.  #2 hypertension Resume Lopressor.  #3 gastroesophageal reflux disease PPI.  #4 headaches Likely a tension headache. CT of the head was negative. Patient with no neurological deficits. Pain management.  #5 prophylaxis PPI for GI prophylaxis. Lovenox for DVT prophylaxis.   Code Status: Full Family Communication: Updated patient and husband at bedside. Disposition Plan: Admit to telemetry  Time spent: [facility] Pager [phone]

## 2013-04-23 NOTE — ED Notes (Signed)
Patient transported to CT 

## 2013-04-23 NOTE — ED Notes (Signed)
Called report to [facility] on [address] at [place].

## 2013-04-24 DIAGNOSIS — I369 Nonrheumatic tricuspid valve disorder, unspecified: Secondary | ICD-10-CM

## 2013-04-24 DIAGNOSIS — R079 Chest pain, unspecified: Secondary | ICD-10-CM

## 2013-04-24 LAB — LIPID PANEL
Cholesterol: 140 mg/dL (ref 0–200)
HDL: 47 mg/dL (ref >39)
LDL Cholesterol: 70 mg/dL (ref 0–99)
Total CHOL/HDL Ratio: 3 RATIO
Triglycerides: 113 mg/dL (ref <150)
VLDL: 23 mg/dL (ref 0–40)

## 2013-04-24 LAB — BASIC METABOLIC PANEL
BUN: 10 mg/dL (ref 6–23)
CALCIUM: 8.1 mg/dL — AB (ref 8.4–10.5)
CO2: 21 mEq/L (ref 19–32)
Chloride: 109 mEq/L (ref 96–112)
Creatinine, Ser: 0.7 mg/dL (ref 0.50–1.10)
GFR calc Af Amer: >90 mL/min (ref >90)
Glucose, Bld: 82 mg/dL (ref 70–99)
Potassium: 3.9 mEq/L (ref 3.7–5.3)
SODIUM: 142 meq/L (ref 137–147)

## 2013-04-24 LAB — CBC
HCT: 33.3 % — ABNORMAL LOW (ref 36.0–46.0)
Hemoglobin: 10.8 g/dL — ABNORMAL LOW (ref 12.0–15.0)
MCH: 27.3 pg (ref 26.0–34.0)
MCHC: 32.4 g/dL (ref 30.0–36.0)
MCV: 84.3 fL (ref 78.0–100.0)
PLATELETS: 215 10*3/uL (ref 150–400)
RBC: 3.95 MIL/uL (ref 3.87–5.11)
RDW: 14.3 % (ref 11.5–15.5)
WBC: 5.8 10*3/uL (ref 4.0–10.5)

## 2013-04-24 LAB — TROPONIN I
Troponin I: <0.3 ng/mL (ref <0.30)
Troponin I: <0.3 ng/mL (ref <0.30)

## 2013-04-24 LAB — TSH: TSH: 2.439 u[IU]/mL (ref 0.350–4.500)

## 2013-04-24 LAB — LIPASE, BLOOD: Lipase: 56 U/L (ref 11–59)

## 2013-04-24 MED ORDER — METOPROLOL SUCCINATE ER 50 MG PO TB24
50.0000 mg | ORAL_TABLET | Freq: Every day | ORAL | Status: DC
  Administered 2013-04-24 – 2013-04-25 (×2): 50 mg via ORAL
  Filled 2013-04-24 (×2): qty 1

## 2013-04-24 MED ORDER — PANTOPRAZOLE SODIUM 40 MG PO TBEC
40.0000 mg | DELAYED_RELEASE_TABLET | Freq: Every day | ORAL | Status: DC
  Administered 2013-04-24 – 2013-04-25 (×2): 40 mg via ORAL
  Filled 2013-04-24 (×2): qty 1

## 2013-04-24 NOTE — Progress Notes (Signed)
Echocardiogram 2D Echocardiogram has been performed.  Carrie Elliott 04/24/2013, 10:29 AM

## 2013-04-24 NOTE — Progress Notes (Signed)
UR completed 

## 2013-04-24 NOTE — Progress Notes (Signed)
Chest pain unit rounds:  Troponins negative x 3. EKG normal.  If 2D echo is negative, no further inpatient cardiology workup. Can consider outpatient myoview after GI issues are resolved.

## 2013-04-24 NOTE — Progress Notes (Signed)
[facility] PROGRESS NOTE  Carrie Elliott GNF:[id number] DOB: 07/25/1950 DOA: 04/23/2013 PCP: Maryjean Ka, MD  Assessment/Plan: Abdominal Pain/?Mild Acute Pancreatitis -Advance diet to clears. -Restart PO meds. -Seems to be improving clinically. -Does not drink ETOH and had a cholecystectomy in the 80s. -Abd [place] without biliary ductal dilatation. -Recheck LFTs in am. -Lipase trending down.  Chest Pain -Has ruled out for ACS with negative troponins and no ischemic changes on EKG. -Believe more of epigastric pain related to her pancreatitis.  HTN -Continue home meds. -Well controlled.  GERD -With complicated GI history following an esophageal perforation in 2011. -Continue PPI.  HA -Improved with PRN pain meds. -CT Head negative for acute pathology.  Code Status: Full Code Family Communication: Husband Buddy, at bedside updated on plan of care.  Disposition Plan: Home when ready; anticipate 24-48 hours.   Consultants:  None   Antibiotics:  None   Subjective: Abdominal/Chest pain, HA and nausea are all improved today.  Objective: Filed Vitals:   04/23/13 1647 04/23/13 2100 04/24/13 0512 04/24/13 1400  BP: 136/65 127/80 137/71 124/74  Pulse: 68 66 61 63  Temp: 97.8 F (36.6 C) 97.6 F (36.4 C) 98 F (36.7 C) 97.8 F (36.6 C)  TempSrc: Oral Oral Oral Oral  Resp: 18 16 15 16   Height:      Weight:      SpO2: 95% 99% 99% 97%    Intake/Output Summary (Last 24 hours) at 04/24/13 1409 Last data filed at 04/24/13 0700  Gross per 24 hour  Intake      0 ml  Output   1100 ml  Net  -1100 ml   Filed Weights   04/23/13 1055  Weight: 71.668 kg (158 lb)    Exam:   General:  AA Ox3, NAD  Cardiovascular: RRR  Respiratory: CTA B  Abdomen: S/NT/ND/+BS  Extremities: no C/C/E/+pedal pulses   Neurologic:  Intact and non-focal  Data Reviewed: Basic Metabolic Panel:  Recent Labs Lab 04/23/13 1130 04/23/13 1918 04/24/13 0420  NA 139  --  142   K 4.3  --  3.9  CL 101  --  109  CO2 25  --  21  GLUCOSE 93  --  82  BUN 17  --  10  CREATININE 0.80 0.75 0.70  CALCIUM 9.6  --  8.1*  MG  --  1.9  --    Liver Function Tests:  Recent Labs Lab 04/23/13 1130  AST 18  ALT 16  ALKPHOS 69  BILITOT 0.3  PROT 7.8  ALBUMIN 4.1    Recent Labs Lab 04/23/13 1130 04/23/13 1918 04/24/13 0420  LIPASE 71* 49 56   No results found for this basename: AMMONIA,  in the last 168 hours CBC:  Recent Labs Lab 04/23/13 1130 04/23/13 1918 04/24/13 0420  WBC 6.0 6.2 5.8  NEUTROABS 3.8  --   --   HGB 12.1 11.3* 10.8*  HCT 37.2 34.9* 33.3*  MCV 85.3 84.1 84.3  PLT 251 226 215   Cardiac Enzymes:  Recent Labs Lab 04/23/13 1130 04/23/13 1425 04/23/13 1918 04/24/13 0109 04/24/13 0655  TROPONINI <0.30 <0.30 <0.30 <0.30 <0.30   BNP (last 3 results) No results found for this basename: PROBNP,  in the last 8760 hours CBG: No results found for this basename: GLUCAP,  in the last 168 hours  No results found for this or any previous visit (from the past 240 hour(s)).   Studies: Dg Chest 2 View  04/23/2013   CLINICAL DATA:  Chest pain.  EXAM: CHEST  2 VIEW  COMPARISON:  04/10/2010  FINDINGS: The heart size and mediastinal contours are within normal limits. Both lungs are clear. The visualized skeletal structures are unremarkable.  IMPRESSION: Normal exam.   Electronically Signed   By: Rozetta Nunnery M.D.   On: 04/23/2013 12:36   Ct Head Wo Contrast  04/23/2013   CLINICAL DATA:  Headache and dizziness for 2 days.  EXAM: CT HEAD WITHOUT CONTRAST  TECHNIQUE: Contiguous axial images were obtained from the base of the skull through the vertex without intravenous contrast.  COMPARISON:  CT scan dated 02/14/2010  FINDINGS: No mass lesion. No midline shift. No acute hemorrhage or hematoma. No extra-axial fluid collections. No evidence of acute infarction. Brain parenchyma is normal. No osseous abnormality.  IMPRESSION: Normal exam, unchanged.    Electronically Signed   By: Rozetta Nunnery M.D.   On: 04/23/2013 12:37   US Abdomen Complete  04/23/2013   CLINICAL DATA:  Abdominal pain, possible pancreatitis, history of cholecystectomy  EXAM: ULTRASOUND ABDOMEN COMPLETE  COMPARISON:  MR ABDOMEN WO/W CM dated 02/17/2013; MR ABDOMEN WO/W CM dated 03/09/2011  FINDINGS: Gallbladder:  Surgically absent  Common bile duct:  Diameter: Normal in size measuring 4.6 mm in diameter  Liver:  Homogeneous hepatic echotexture. No discrete hepatic lesions. No definite evidence of intrahepatic biliary ductal dilatation. No ascites.  IVC:  No abnormality visualized.  Pancreas:  Limited visualization of the pancreatic head and neck is normal. No definite pancreatic ductal dilatation. Visualization of the pancreatic body and tail is obscured by bowel gas.  Spleen:  Normal in size measuring 8.2 cm in length.  Right Kidney:  Normal cortical thickness, echogenicity and size, measuring 11.5 cm in length. Note is made of an exophytic approximately 2.6 x 2.1 x 2.0 cm anechoic cyst arising from the superior pole of the right kidney, unchanged since recently performed abdominal MRI. No echogenic renal stones. No urinary obstruction.  Left Kidney:  Normal cortical thickness, echogenicity and size, measuring 11.2 cm in length. No focal renal lesions. No echogenic renal stones. No urinary obstruction.  Abdominal aorta:  No aneurysm visualized.  Other findings:  None.  IMPRESSION: 1. No explanation for patient's abdominal pain. Specifically, the imaged pancreatic head and neck is normal. There is suboptimal evaluation of the pancreatic body and tail secondary to overlying bowel gas. Further evaluation with amylase, lipase and abdominal CT could be performed as clinically indicated. 2. Unchanged approximately 2.6 cm exophytic cyst arising from the superior pole right kidney. 3. Post cholecystectomy.   Electronically Signed   By: Sandi Mariscal M.D.   On: 04/23/2013 20:44    Scheduled Meds: .  enoxaparin (LOVENOX) injection  40 mg Subcutaneous Q24H  . fluticasone  2 spray Each Nare Daily  . metoprolol succinate  50 mg Oral Daily  . pantoprazole  40 mg Oral Daily   Continuous Infusions: . sodium chloride 150 mL/hr at 04/24/13 1126    Principal Problem:   Chest pain Active Problems:   Arthritis   Reflux   IBS (irritable bowel syndrome)   HTN (hypertension)   Headache(784.0)    Time spent: 35 minutes. Greater than 50% of this time was spent in direct contact with the patient coordinating care.    Lelon Frohlich  [facility] Pager [phone]  If 7PM-7AM, please contact night-coverage at [email or web address], password [email or web address] 04/24/2013, 2:09 PM  LOS: 1 day

## 2013-04-25 LAB — CBC
HEMATOCRIT: 33.4 % — AB (ref 36.0–46.0)
Hemoglobin: 11.1 g/dL — ABNORMAL LOW (ref 12.0–15.0)
MCH: 28.2 pg (ref 26.0–34.0)
MCHC: 33.2 g/dL (ref 30.0–36.0)
MCV: 84.8 fL (ref 78.0–100.0)
PLATELETS: 199 10*3/uL (ref 150–400)
RBC: 3.94 MIL/uL (ref 3.87–5.11)
RDW: 14.3 % (ref 11.5–15.5)
WBC: 4.3 10*3/uL (ref 4.0–10.5)

## 2013-04-25 LAB — COMPREHENSIVE METABOLIC PANEL
ALBUMIN: 3.1 g/dL — AB (ref 3.5–5.2)
ALK PHOS: 56 U/L (ref 39–117)
ALT: 11 U/L (ref 0–35)
AST: 13 U/L (ref 0–37)
BUN: 8 mg/dL (ref 6–23)
CHLORIDE: 107 meq/L (ref 96–112)
CO2: 23 meq/L (ref 19–32)
CREATININE: 0.84 mg/dL (ref 0.50–1.10)
Calcium: 8.8 mg/dL (ref 8.4–10.5)
GFR calc Af Amer: 85 mL/min — ABNORMAL LOW (ref >90)
GFR calc non Af Amer: 73 mL/min — ABNORMAL LOW (ref >90)
Glucose, Bld: 85 mg/dL (ref 70–99)
Potassium: 3.9 mEq/L (ref 3.7–5.3)
Sodium: 142 mEq/L (ref 137–147)
Total Bilirubin: 0.3 mg/dL (ref 0.3–1.2)
Total Protein: 6.3 g/dL (ref 6.0–8.3)

## 2013-04-25 LAB — LIPASE, BLOOD: Lipase: 56 U/L (ref 11–59)

## 2013-04-25 NOTE — Discharge Summary (Signed)
Physician Discharge Summary  Carrie Elliott [id number] DOB: 1950/05/06 DOA: 04/23/2013  PCP: Maryjean Ka, MD  Admit date: 04/23/2013 Discharge date: 04/25/2013  Time spent: 45 minutes  Recommendations for Outpatient Follow-up:  -Will be discharged home today. -Advised to follow up with PCP as scheduled.   Discharge Diagnoses:  Principal Problem:   Chest pain Active Problems:   Arthritis   Reflux   IBS (irritable bowel syndrome)   HTN (hypertension)   Headache(784.0)   Discharge Condition: Stable and improved  Filed Weights   04/23/13 1055 04/25/13 0601  Weight: 71.668 kg (158 lb) 75.07 kg (165 lb 8 oz)    History of present illness:  63 y.o. 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 Patient's does state that 3 days prior to admission had a midsternal chest pain with some radiation to the right upper extremity with an aching like feeling. Patient still has had some shoulder problems with injections in the past.  Patient states 2 days prior to admission she had some chest discomfort while at work also noted to have a headache blood pressure that time was 130/84 was given some Tylenol with some improvement.  Patient states on the day of admission while at work while trying to get medications at the refrigerator had some midsternal chest discomfort with also a bandlike feeling. Patient also with complaints of a headache that started at the back of her head and moves up bilaterally to her temples. Patient did state that had some numbness to the  left fourth and fifth fingers as well. Patient said that had likely check her blood pressure at work and was elevated at 178/100. Patient does endorse some shortness of breath on climbing stairs recently. Patient denies any fever, no chills, no diaphoresis, no dysuria, no palpitations, no changes no shortness of breath, no abdominal pain. Patient does endorse some nausea and a couple episodes of emesis after transport to the hospital.  Patient states was a GI cocktail and morphine which helped the chest pain. Patient also with complaints of headache.  Patient was seen in the ED EKG which was done had a normal sinus rhythm. 2 sets of troponin which were done were negative. CBC WNL. Comprehensive metabolic profile which was done had a elevated lipase level of 71. Urinalysis which was done was negative. Chest x-ray which was done was negative. CT of the head which was done was negative for any acute abnormalities.  We were called to admit the patient for further evaluation and management of the chest pain rule out.      Hospital Course:   Abdominal Pain/?Mild Acute Pancreatitis  -Tolerating a solid diet -Restart PO meds.  -Seems to be improving clinically.  -Does not drink ETOH and had a cholecystectomy in the 80s.  -Abd [place] without biliary ductal dilatation.  -Lipase trending down.   Chest Pain  -Has ruled out for ACS with negative troponins and no ischemic changes on EKG.  -Believe more of epigastric pain related to her pancreatitis.  -ECHO with EF 45-50%. Has been seen by cards with recs to follow up with her  cardiologist as an OP to determine if stress test required.  HTN  -Continue home meds.  -Well controlled.   GERD  -With complicated GI history following an esophageal perforation in 2011.  -Continue PPI.   HA  -Improved with PRN pain meds.  -CT Head negative for acute pathology.   Procedures:  None   Consultations:  Cards  Discharge Instructions  Discharge Orders    Future Appointments Provider Department Dept Phone   07/24/2013 2:30 PM Mosie Lukes, MD [place] at  [facility] [phone]   Future Orders Complete By Expires   Diet - low sodium heart healthy  As directed    Discontinue IV  As directed    Increase activity slowly  As directed        Medication List         acetaminophen 500 MG tablet  Commonly known as:  TYLENOL  Take 1,000 mg by mouth every 6 (six) hours as needed for headache (pain).     benzocaine 10 % mucosal gel  Commonly known as:  ORAJEL  Use as directed 1 application in the mouth or throat as needed for mouth pain.     ciclesonide 50 MCG/ACT nasal spray  Commonly known as:  OMNARIS  Place 2 sprays into both nostrils daily.     esomeprazole 40 MG capsule  Commonly known as:  NEXIUM  Take 40 mg by mouth 2 (two) times daily.     fexofenadine 180 MG tablet  Commonly known as:  ALLEGRA  Take 180 mg by mouth daily.     Lidocaine-Hydrocortisone Ace 3-0.5 % Crea  Apply topically.     metoprolol succinate 50 MG 24 hr tablet  Commonly known as:  TOPROL-XL  Take 50 mg by mouth daily.     ranitidine 150 MG tablet  Commonly known as:  ZANTAC  Take 300 mg by mouth daily as needed for heartburn.       Allergies  Allergen Reactions  . Cefdinir     HIVES  . Celebrex [Celecoxib]     ABDOMINAL PAIN   . Ciprofloxacin     ABDOMINAL PAIN   . Codeine     NAUSEA VOMITING   . Omni-Pac     HIVES  . Pseudoephedrine Hcl Er     TACHYCARDIA        Follow-up Information   Follow up with Sinclair Grooms, MD. Schedule an appointment as soon as possible for a visit in 2 weeks.   Specialty:  Cardiology   Contact information:   [address] [place] [address] [phone]        The results of significant diagnostics from this hospitalization (including imaging, microbiology, ancillary and laboratory) are listed below for reference.    Significant Diagnostic Studies: Dg Chest 2  View  04/23/2013   CLINICAL DATA:  Chest pain.  EXAM: CHEST  2 VIEW  COMPARISON:  04/10/2010  FINDINGS: The heart size and mediastinal contours are within normal limits. Both lungs are clear. The visualized skeletal structures are unremarkable.  IMPRESSION: Normal exam.   Electronically Signed   By: Rozetta Nunnery M.D.   On: 04/23/2013 12:36   Ct Head Wo Contrast  04/23/2013   CLINICAL DATA:  Headache and dizziness for 2 days.  EXAM: CT HEAD WITHOUT CONTRAST  TECHNIQUE: Contiguous axial images were obtained from the base of the skull through the vertex without intravenous contrast.  COMPARISON:  CT scan dated 02/14/2010  FINDINGS:  No mass lesion. No midline shift. No acute hemorrhage or hematoma. No extra-axial fluid collections. No evidence of acute infarction. Brain parenchyma is normal. No osseous abnormality.  IMPRESSION: Normal exam, unchanged.   Electronically Signed   By: Rozetta Nunnery M.D.   On: 04/23/2013 12:37   US Abdomen Complete  04/23/2013   CLINICAL DATA:  Abdominal pain, possible pancreatitis, history of cholecystectomy  EXAM: ULTRASOUND ABDOMEN COMPLETE  COMPARISON:  MR ABDOMEN WO/W CM dated 02/17/2013; MR ABDOMEN WO/W CM dated 03/09/2011  FINDINGS: Gallbladder:  Surgically absent  Common bile duct:  Diameter: Normal in size measuring 4.6 mm in diameter  Liver:  Homogeneous hepatic echotexture. No discrete hepatic lesions. No definite evidence of intrahepatic biliary ductal dilatation. No ascites.  IVC:  No abnormality visualized.  Pancreas:  Limited visualization of the pancreatic head and neck is normal. No definite pancreatic ductal dilatation. Visualization of the pancreatic body and tail is obscured by bowel gas.  Spleen:  Normal in size measuring 8.2 cm in length.  Right Kidney:  Normal cortical thickness, echogenicity and size, measuring 11.5 cm in length. Note is made of an exophytic approximately 2.6 x 2.1 x 2.0 cm anechoic cyst arising from the superior pole of the right kidney,  unchanged since recently performed abdominal MRI. No echogenic renal stones. No urinary obstruction.  Left Kidney:  Normal cortical thickness, echogenicity and size, measuring 11.2 cm in length. No focal renal lesions. No echogenic renal stones. No urinary obstruction.  Abdominal aorta:  No aneurysm visualized.  Other findings:  None.  IMPRESSION: 1. No explanation for patient's abdominal pain. Specifically, the imaged pancreatic head and neck is normal. There is suboptimal evaluation of the pancreatic body and tail secondary to overlying bowel gas. Further evaluation with amylase, lipase and abdominal CT could be performed as clinically indicated. 2. Unchanged approximately 2.6 cm exophytic cyst arising from the superior pole right kidney. 3. Post cholecystectomy.   Electronically Signed   By: Sandi Mariscal M.D.   On: 04/23/2013 20:44    Microbiology: No results found for this or any previous visit (from the past 240 hour(s)).   Labs: Basic Metabolic Panel:  Recent Labs Lab 04/23/13 1130 04/23/13 1918 04/24/13 0420 04/25/13 0335  NA 139  --  142 142  K 4.3  --  3.9 3.9  CL 101  --  109 107  CO2 25  --  21 23  GLUCOSE 93  --  82 85  BUN 17  --  10 8  CREATININE 0.80 0.75 0.70 0.84  CALCIUM 9.6  --  8.1* 8.8  MG  --  1.9  --   --    Liver Function Tests:  Recent Labs Lab 04/23/13 1130 04/25/13 0335  AST 18 13  ALT 16 11  ALKPHOS 69 56  BILITOT 0.3 0.3  PROT 7.8 6.3  ALBUMIN 4.1 3.1*    Recent Labs Lab 04/23/13 1130 04/23/13 1918 04/24/13 0420 04/25/13 0335  LIPASE 71* 49 56 56   No results found for this basename: AMMONIA,  in the last 168 hours CBC:  Recent Labs Lab 04/23/13 1130 04/23/13 1918 04/24/13 0420 04/25/13 0335  WBC 6.0 6.2 5.8 4.3  NEUTROABS 3.8  --   --   --   HGB 12.1 11.3* 10.8* 11.1*  HCT 37.2 34.9* 33.3* 33.4*  MCV 85.3 84.1 84.3 84.8  PLT 251 226 215 199   Cardiac Enzymes:  Recent Labs Lab 04/23/13 1130 04/23/13 1425 04/23/13 1918  04/24/13 0109 04/24/13  0655  TROPONINI <0.30 <0.30 <0.30 <0.30 <0.30   BNP: BNP (last 3 results) No results found for this basename: PROBNP,  in the last 8760 hours CBG: No results found for this basename: GLUCAP,  in the last 168 hours     Signed:  Lelon Frohlich  [facility] Pager: [phone] 04/25/2013, 5:15 PM

## 2013-05-11 ENCOUNTER — Encounter: Payer: Self-pay | Admitting: Interventional Cardiology

## 2013-05-11 ENCOUNTER — Ambulatory Visit (INDEPENDENT_AMBULATORY_CARE_PROVIDER_SITE_OTHER): Payer: 59 | Admitting: Interventional Cardiology

## 2013-05-11 VITALS — BP 136/77 | HR 72 | Ht 61.0 in | Wt 158.0 lb

## 2013-05-11 DIAGNOSIS — R079 Chest pain, unspecified: Secondary | ICD-10-CM

## 2013-05-11 DIAGNOSIS — R51 Headache: Secondary | ICD-10-CM

## 2013-05-11 DIAGNOSIS — I10 Essential (primary) hypertension: Secondary | ICD-10-CM

## 2013-05-11 NOTE — Progress Notes (Signed)
Patient ID: Carrie Elliott, female   DOB: [birth year], 63 y.o.   MRN: [id number]   Date: 05/11/2013 ID: Carrie Elliott, DOB 11/28/1950, MRN [id number] PCP: Maryjean Ka, MD  Reason: Chest pain  ASSESSMENT;  1. Chest discomfort, atypical. Prior workup was negative based upon myocardial perfusion imaging in 2011. Recent chest CT did not reveal any evidence of coronary calcification. The likelihood of the chest discomfort is cardiac it seems very low.  2. Recurring headache 3. Hypertension  PLAN:  1. Exercise treadmill test on medical therapy to assess for ischemia and blood pressure control   SUBJECTIVE: Carrie Elliott is a 63 y.o. female who is here for followup after recent hospitalization. She developed sudden posterior headache. This was then followed by precordial chest discomfort. She ended up going to the [employer]. She was admitted to [facility] where myocardial infarction was ruled out. She has had no recurrence since discharge following that 24 hour stay. A CT scan of the chest did not reveal any evidence of coronary calcification. She has been back at work without any limitations. She has been under great stress related to the recent death of her mother in early 03/30/22 and moving her father 2 and elderly living facility in [place]. She has not been sleeping well.  Allergies  Allergen Reactions  . Cefdinir     HIVES  . Celebrex [Celecoxib]     ABDOMINAL PAIN   . Ciprofloxacin     ABDOMINAL PAIN   . Codeine     NAUSEA VOMITING   . Omni-Pac     HIVES  . Pseudoephedrine Hcl Er     TACHYCARDIA     Current Outpatient Prescriptions on File Prior to Visit  Medication Sig Dispense Refill  . acetaminophen (TYLENOL) 500 MG tablet Take 1,000 mg by mouth every 6 (six) hours as needed for headache (pain).      . benzocaine (ORAJEL) 10 % mucosal gel Use as directed 1 application in the mouth or throat as needed for mouth pain.      . ciclesonide (OMNARIS) 50 MCG/ACT  nasal spray Place 2 sprays into both nostrils daily.        Marland Kitchen esomeprazole (NEXIUM) 40 MG capsule Take 40 mg by mouth 2 (two) times daily.        . fexofenadine (ALLEGRA) 180 MG tablet Take 180 mg by mouth daily.        . Lidocaine-Hydrocortisone Ace 3-0.5 % CREA Apply topically.      . metoprolol (TOPROL-XL) 50 MG 24 hr tablet Take 50 mg by mouth daily.       . ranitidine (ZANTAC) 150 MG tablet Take 300 mg by mouth daily as needed for heartburn.       No current facility-administered medications on file prior to visit.    Past Medical History  Diagnosis Date  . Allergic rhinitis   . History of colonic polyps   . Arthritis   . Reflux     cough  . Cystocele     grade 2  . Rectocele     grade 2  . IBS (irritable bowel syndrome)   . Rosacea   . Sternal fracture     1986  . GERD (gastroesophageal reflux disease)   . HTN (hypertension) 04/23/2013    Past Surgical History  Procedure Laterality Date  . Appendectomy  1989  . Cholecystectomy  1989  . Atypical nevus excision    . Hemorrhoid surgery  2005  .  Left oophorectomy  6160    complicated by ureteral injuery requiring a ureteral stent   . Wisdom teeth removal  1964  . Total abdominal hysterectomy  2000  . Tubal ligation  1981  . Colonoscopy    . Esophagogastroduodenoscopy    . Mmk / anterior vesicourethropexy / urethropexy    . Cystoscopy w/ ureteral stent removal  2011    History   Social History  . Marital Status: Married    Spouse Name: N/A    Number of Children: N/A  . Years of Education: N/A   Occupational History  . Not on file.   Social History Main Topics  . Smoking status: Never Smoker   . Smokeless tobacco: Not on file  . Alcohol Use: No  . Drug Use: No  . Sexual Activity: Not on file   Other Topics Concern  . Not on file   Social History Narrative  . No narrative on file    Family History  Problem Relation Age of Onset  . Colon polyps Father   . Hypertension Father   . Heart failure  Father   . Melanoma Father   . Heart attack Father     x2  . Osteoporosis Mother   . Transient ischemic attack Mother   . Diabetes Paternal Grandmother   . Stroke Maternal Grandmother   . Stroke Maternal Grandfather     ROS: Denies chills, neurological complaints, abdominal pain, orthopnea, PND, exertional dyspnea, edema, or wheezing, and loss of appetite per. Other systems negative for complaints.  OBJECTIVE: BP 136/77  Pulse 72  Ht 5\' 1"  (1.549 m)  Wt 158 lb (71.668 kg)  BMI 29.87 kg/m2,  General: No acute distress, obese HEENT: normal without jaundice or pallor Neck: JVD flat. Carotids 2+ without bruits Chest: Clear Cardiac: Murmur: Absent. Gallop: Absent. Rhythm: Regular. Other: Normal Abdomen: Bruit: Absent. Pulsation: 2+ Extremities: Edema: Absent. Pulses: 2+ bilateral Neuro: Normal Psych: Normal  ECG: End January tracings were all normal when hospitalized.

## 2013-05-11 NOTE — Patient Instructions (Signed)
Your physician recommends that you continue on your current medications as directed. Please refer to the Current Medication list given to you today.  Your physician has requested that you have an exercise tolerance test. For further information please visit [email or web address]. Please also follow instruction sheet, as given.   Your physician recommends that you schedule a follow-up appointment as needed  

## 2013-05-21 ENCOUNTER — Telehealth: Payer: Self-pay | Admitting: Cardiology

## 2013-05-21 NOTE — Telephone Encounter (Signed)
Pt called asking if she is to take her beta blocker tomorrow morning, as she is scheduled to undergo exercise stress testing. She was told by Dr. Tamala Julian at her last office visit to take her 50 mg Toprol the morning of, however she received instructions from the office last PM not to take her BB. I contacted Dr. Tamala Julian directly for clarification. He has instructed her to take her Toprol in the morning. I notified the patient of Dr. Thompson Caul response. She verbalized understanding.   Lyda Jester, PA-C

## 2013-05-22 ENCOUNTER — Ambulatory Visit (HOSPITAL_COMMUNITY)
Admission: RE | Admit: 2013-05-22 | Discharge: 2013-05-22 | Disposition: A | Discharge status: Home / Self Care | Payer: 59 | Source: Ambulatory Visit | Attending: Cardiovascular Disease | Admitting: Cardiovascular Disease

## 2013-05-22 DIAGNOSIS — R079 Chest pain, unspecified: Secondary | ICD-10-CM | POA: Insufficient documentation

## 2013-05-30 ENCOUNTER — Encounter: Payer: Self-pay | Admitting: Interventional Cardiology

## 2013-05-30 ENCOUNTER — Telehealth: Payer: Self-pay | Admitting: Interventional Cardiology

## 2013-05-30 NOTE — Telephone Encounter (Signed)
Let her know stress test is unremarkable

## 2013-06-02 ENCOUNTER — Telehealth: Payer: Self-pay | Admitting: Interventional Cardiology

## 2013-06-02 NOTE — Telephone Encounter (Signed)
pt husband given reult of gxt. stress test is unremarkable.pt husband verbalized understanding.

## 2013-06-02 NOTE — Telephone Encounter (Signed)
pt husband called to rqst copy of gxt report be mailed to pt. Done.

## 2013-07-10 ENCOUNTER — Encounter: Payer: Self-pay | Admitting: Family Medicine

## 2013-07-10 ENCOUNTER — Ambulatory Visit (INDEPENDENT_AMBULATORY_CARE_PROVIDER_SITE_OTHER): Payer: 59 | Admitting: Family Medicine

## 2013-07-10 VITALS — BP 120/70 | HR 79 | Temp 97.9°F | Resp 16 | Ht 61.0 in | Wt 164.1 lb

## 2013-07-10 DIAGNOSIS — IMO0001 Reserved for inherently not codable concepts without codable children: Secondary | ICD-10-CM

## 2013-07-10 DIAGNOSIS — K589 Irritable bowel syndrome without diarrhea: Secondary | ICD-10-CM

## 2013-07-10 DIAGNOSIS — H8110 Benign paroxysmal vertigo, unspecified ear: Secondary | ICD-10-CM

## 2013-07-10 DIAGNOSIS — I10 Essential (primary) hypertension: Secondary | ICD-10-CM

## 2013-07-10 DIAGNOSIS — T7840XA Allergy, unspecified, initial encounter: Secondary | ICD-10-CM | POA: Insufficient documentation

## 2013-07-10 DIAGNOSIS — K219 Gastro-esophageal reflux disease without esophagitis: Secondary | ICD-10-CM

## 2013-07-10 DIAGNOSIS — J309 Allergic rhinitis, unspecified: Secondary | ICD-10-CM

## 2013-07-10 DIAGNOSIS — Z8619 Personal history of other infectious and parasitic diseases: Secondary | ICD-10-CM | POA: Insufficient documentation

## 2013-07-10 MED ORDER — METOPROLOL SUCCINATE ER 50 MG PO TB24: 50.0000 mg | ORAL_TABLET | Freq: Every day | ORAL | Status: DC

## 2013-07-10 MED ORDER — METHYLPREDNISOLONE (PAK) 4 MG PO TABS: ORAL_TABLET | ORAL | Status: DC

## 2013-07-10 NOTE — Patient Instructions (Signed)
Prevnar will they pay before 65 without comorbidities  Preventive Care for Adults, Female A healthy lifestyle and preventive care can promote health and wellness. Preventive health guidelines for women include the following key practices.  A routine yearly physical is a good way to check with your health care provider about your health and preventive screening. It is a chance to share any concerns and updates on your health and to receive a thorough exam.  Visit your dentist for a routine exam and preventive care every 6 months. Brush your teeth twice a day and floss once a day. Good oral hygiene prevents tooth decay and gum disease.  The frequency of eye exams is based on your age, health, family medical history, use of contact lenses, and other factors. Follow your health care provider's recommendations for frequency of eye exams.  Eat a healthy diet. Foods like vegetables, fruits, whole grains, low-fat dairy products, and lean protein foods contain the nutrients you need without too many calories. Decrease your intake of foods high in solid fats, added sugars, and salt. Eat the right amount of calories for you.Get information about a proper diet from your health care provider, if necessary.  Regular physical exercise is one of the most important things you can do for your health. Most adults should get at least 150 minutes of moderate-intensity exercise (any activity that increases your heart rate and causes you to sweat) each week. In addition, most adults need muscle-strengthening exercises on 2 or more days a week.  Maintain a healthy weight. The body mass index (BMI) is a screening tool to identify possible weight problems. It provides an estimate of body fat based on height and weight. Your health care provider can find your BMI, and can help you achieve or maintain a healthy weight.For adults 20 years and older:  A BMI below 18.5 is considered underweight.  A BMI of 18.5 to 24.9 is  normal.  A BMI of 25 to 29.9 is considered overweight.  A BMI of 30 and above is considered obese.  Maintain normal blood lipids and cholesterol levels by exercising and minimizing your intake of saturated fat. Eat a balanced diet with plenty of fruit and vegetables. Blood tests for lipids and cholesterol should begin at age 42 and be repeated every 5 years. If your lipid or cholesterol levels are high, you are over 50, or you are at high risk for heart disease, you may need your cholesterol levels checked more frequently.Ongoing high lipid and cholesterol levels should be treated with medicines if diet and exercise are not working.  If you smoke, find out from your health care provider how to quit. If you do not use tobacco, do not start.  Lung cancer screening is recommended for adults aged 26 80 years who are at high risk for developing lung cancer because of a history of smoking. A yearly low-dose CT scan of the lungs is recommended for people who have at least a 30-pack-year history of smoking and are a current smoker or have quit within the past 15 years. A pack year of smoking is smoking an average of 1 pack of cigarettes a day for 1 year (for example: 1 pack a day for 30 years or 2 packs a day for 15 years). Yearly screening should continue until the smoker has stopped smoking for at least 15 years. Yearly screening should be stopped for people who develop a health problem that would prevent them from having lung cancer treatment.  If  you are pregnant, do not drink alcohol. If you are breastfeeding, be very cautious about drinking alcohol. If you are not pregnant and choose to drink alcohol, do not have more than 1 drink per day. One drink is considered to be 12 ounces (355 mL) of beer, 5 ounces (148 mL) of wine, or 1.5 ounces (44 mL) of liquor.  Avoid use of street drugs. Do not share needles with anyone. Ask for help if you need support or instructions about stopping the use of  drugs.  High blood pressure causes heart disease and increases the risk of stroke. Your blood pressure should be checked at least every 1 to 2 years. Ongoing high blood pressure should be treated with medicines if weight loss and exercise do not work.  If you are 80 63 years old, ask your health care provider if you should take aspirin to prevent strokes.  Diabetes screening involves taking a blood sample to check your fasting blood sugar level. This should be done once every 3 years, after age 25, if you are within normal weight and without risk factors for diabetes. Testing should be considered at a younger age or be carried out more frequently if you are overweight and have at least 1 risk factor for diabetes.  Breast cancer screening is essential preventive care for women. You should practice "breast self-awareness." This means understanding the normal appearance and feel of your breasts and may include breast self-examination. Any changes detected, no matter how small, should be reported to a health care provider. Women in their 88s and 30s should have a clinical breast exam (CBE) by a health care provider as part of a regular health exam every 1 to 3 years. After age 39, women should have a CBE every year. Starting at age 34, women should consider having a mammogram (breast X-ray test) every year. Women who have a family history of breast cancer should talk to their health care provider about genetic screening. Women at a high risk of breast cancer should talk to their health care providers about having an MRI and a mammogram every year.  Breast cancer gene (BRCA)-related cancer risk assessment is recommended for women who have family members with BRCA-related cancers. BRCA-related cancers include breast, ovarian, tubal, and peritoneal cancers. Having family members with these cancers may be associated with an increased risk for harmful changes (mutations) in the breast cancer genes BRCA1 and BRCA2.  Results of the assessment will determine the need for genetic counseling and BRCA1 and BRCA2 testing.  The Pap test is a screening test for cervical cancer. A Pap test can show cell changes on the cervix that might become cervical cancer if left untreated. A Pap test is a procedure in which cells are obtained and examined from the lower end of the uterus (cervix).  Women should have a Pap test starting at age 32.  Between ages 110 and 66, Pap tests should be repeated every 2 years.  Beginning at age 46, you should have a Pap test every 3 years as long as the past 3 Pap tests have been normal.  Some women have medical problems that increase the chance of getting cervical cancer. Talk to your health care provider about these problems. It is especially important to talk to your health care provider if a new problem develops soon after your last Pap test. In these cases, your health care provider may recommend more frequent screening and Pap tests.  The above recommendations are the same for  women who have or have not gotten the vaccine for human papillomavirus (HPV).  If you had a hysterectomy for a problem that was not cancer or a condition that could lead to cancer, then you no longer need Pap tests. Even if you no longer need a Pap test, a regular exam is a good idea to make sure no other problems are starting.  If you are between ages 85 and 61 years, and you have had normal Pap tests going back 10 years, you no longer need Pap tests. Even if you no longer need a Pap test, a regular exam is a good idea to make sure no other problems are starting.  If you have had past treatment for cervical cancer or a condition that could lead to cancer, you need Pap tests and screening for cancer for at least 20 years after your treatment.  If Pap tests have been discontinued, risk factors (such as a new sexual partner) need to be reassessed to determine if screening should be resumed.  The HPV test is an  additional test that may be used for cervical cancer screening. The HPV test looks for the virus that can cause the cell changes on the cervix. The cells collected during the Pap test can be tested for HPV. The HPV test could be used to screen women aged 33 years and older, and should be used in women of any age who have unclear Pap test results. After the age of 66, women should have HPV testing at the same frequency as a Pap test.  Colorectal cancer can be detected and often prevented. Most routine colorectal cancer screening begins at the age of 52 years and continues through age 13 years. However, your health care provider may recommend screening at an earlier age if you have risk factors for colon cancer. On a yearly basis, your health care provider may provide home test kits to check for hidden blood in the stool. Use of a small camera at the end of a tube, to directly examine the colon (sigmoidoscopy or colonoscopy), can detect the earliest forms of colorectal cancer. Talk to your health care provider about this at age 6, when routine screening begins. Direct exam of the colon should be repeated every 5 10 years through age 39 years, unless early forms of pre-cancerous polyps or small growths are found.  People who are at an increased risk for hepatitis B should be screened for this virus. You are considered at high risk for hepatitis B if:  You were born in a country where hepatitis B occurs often. Talk with your health care provider about which countries are considered high risk.  Your parents were born in a high-risk country and you have not received a shot to protect against hepatitis B (hepatitis B vaccine).  You have HIV or AIDS.  You use needles to inject street drugs.  You live with, or have sex with, someone who has Hepatitis B.  You get hemodialysis treatment.  You take certain medicines for conditions like cancer, organ transplantation, and autoimmune conditions.  Hepatitis C  blood testing is recommended for all people born from [birth year] through 1965 and any individual with known risks for hepatitis C.  Practice safe sex. Use condoms and avoid high-risk sexual practices to reduce the spread of sexually transmitted infections (STIs). STIs include gonorrhea, chlamydia, syphilis, trichomonas, herpes, HPV, and human immunodeficiency virus (HIV). Herpes, HIV, and HPV are viral illnesses that have no cure. They can result in  disability, cancer, and death. Sexually active women aged 31 years and younger should be checked for chlamydia. Older women with new or multiple partners should also be tested for chlamydia. Testing for other STIs is recommended if you are sexually active and at increased risk.  Osteoporosis is a disease in which the bones lose minerals and strength with aging. This can result in serious bone fractures or breaks. The risk of osteoporosis can be identified using a bone density scan. Women ages 75 years and over and women at risk for fractures or osteoporosis should discuss screening with their health care providers. Ask your health care provider whether you should take a calcium supplement or vitamin D to reduce the rate of osteoporosis.  Menopause can be associated with physical symptoms and risks. Hormone replacement therapy is available to decrease symptoms and risks. You should talk to your health care provider about whether hormone replacement therapy is right for you.  Use sunscreen. Apply sunscreen liberally and repeatedly throughout the day. You should seek shade when your shadow is shorter than you. Protect yourself by wearing long sleeves, pants, a wide-brimmed hat, and sunglasses year round, whenever you are outdoors.  Once a month, do a whole body skin exam, using a mirror to look at the skin on your back. Tell your health care provider of new moles, moles that have irregular borders, moles that are larger than a pencil eraser, or moles that have changed  in shape or color.  Stay current with required vaccines (immunizations).  Influenza vaccine. All adults should be immunized every year.  Tetanus, diphtheria, and acellular pertussis (Td, Tdap) vaccine. Pregnant women should receive 1 dose of Tdap vaccine during each pregnancy. The dose should be obtained regardless of the length of time since the last dose. Immunization is preferred during the 27th 36th week of gestation. An adult who has not previously received Tdap or who does not know her vaccine status should receive 1 dose of Tdap. This initial dose should be followed by tetanus and diphtheria toxoids (Td) booster doses every 10 years. Adults with an unknown or incomplete history of completing a 3-dose immunization series with Td-containing vaccines should begin or complete a primary immunization series including a Tdap dose. Adults should receive a Td booster every 10 years.  Varicella vaccine. An adult without evidence of immunity to varicella should receive 2 doses or a second dose if she has previously received 1 dose. Pregnant females who do not have evidence of immunity should receive the first dose after pregnancy. This first dose should be obtained before leaving the health care facility. The second dose should be obtained 4 8 weeks after the first dose.  Human papillomavirus (HPV) vaccine. Females aged 74 26 years who have not received the vaccine previously should obtain the 3-dose series. The vaccine is not recommended for use in pregnant females. However, pregnancy testing is not needed before receiving a dose. If a female is found to be pregnant after receiving a dose, no treatment is needed. In that case, the remaining doses should be delayed until after the pregnancy. Immunization is recommended for any person with an immunocompromised condition through the age of 30 years if she did not get any or all doses earlier. During the 3-dose series, the second dose should be obtained 4 8 weeks  after the first dose. The third dose should be obtained 24 weeks after the first dose and 16 weeks after the second dose.  Zoster vaccine. One dose is recommended  for adults aged 41 years or older unless certain conditions are present.  Measles, mumps, and rubella (MMR) vaccine. Adults born before [birth year] generally are considered immune to measles and mumps. Adults born in [birth year] or later should have 1 or more doses of MMR vaccine unless there is a contraindication to the vaccine or there is laboratory evidence of immunity to each of the three diseases. A routine second dose of MMR vaccine should be obtained at least 28 days after the first dose for students attending postsecondary schools, health care workers, or international travelers. People who received inactivated measles vaccine or an unknown type of measles vaccine during 1963 1967 should receive 2 doses of MMR vaccine. People who received inactivated mumps vaccine or an unknown type of mumps vaccine before 1979 and are at high risk for mumps infection should consider immunization with 2 doses of MMR vaccine. For females of childbearing age, rubella immunity should be determined. If there is no evidence of immunity, females who are not pregnant should be vaccinated. If there is no evidence of immunity, females who are pregnant should delay immunization until after pregnancy. Unvaccinated health care workers born before [birth year] who lack laboratory evidence of measles, mumps, or rubella immunity or laboratory confirmation of disease should consider measles and mumps immunization with 2 doses of MMR vaccine or rubella immunization with 1 dose of MMR vaccine.  Pneumococcal 13-valent conjugate (PCV13) vaccine. When indicated, a person who is uncertain of her immunization history and has no record of immunization should receive the PCV13 vaccine. An adult aged 73 years or older who has certain medical conditions and has not been previously immunized should receive 1  dose of PCV13 vaccine. This PCV13 should be followed with a dose of pneumococcal polysaccharide (PPSV23) vaccine. The PPSV23 vaccine dose should be obtained at least 8 weeks after the dose of PCV13 vaccine. An adult aged 52 years or older who has certain medical conditions and previously received 1 or more doses of PPSV23 vaccine should receive 1 dose of PCV13. The PCV13 vaccine dose should be obtained 1 or more years after the last PPSV23 vaccine dose.  Pneumococcal polysaccharide (PPSV23) vaccine. When PCV13 is also indicated, PCV13 should be obtained first. All adults aged 62 years and older should be immunized. An adult younger than age 4 years who has certain medical conditions should be immunized. Any person who resides in a nursing home or long-term care facility should be immunized. An adult smoker should be immunized. People with an immunocompromised condition and certain other conditions should receive both PCV13 and PPSV23 vaccines. People with human immunodeficiency virus (HIV) infection should be immunized as soon as possible after diagnosis. Immunization during chemotherapy or radiation therapy should be avoided. Routine use of PPSV23 vaccine is not recommended for American Indians, [place] Natives, or people younger than 65 years unless there are medical conditions that require PPSV23 vaccine. When indicated, people who have unknown immunization and have no record of immunization should receive PPSV23 vaccine. One-time revaccination 5 years after the first dose of PPSV23 is recommended for people aged 5 64 years who have chronic kidney failure, nephrotic syndrome, asplenia, or immunocompromised conditions. People who received 1 2 doses of PPSV23 before age 97 years should receive another dose of PPSV23 vaccine at age 66 years or later if at least 5 years have passed since the previous dose. Doses of PPSV23 are not needed for people immunized with PPSV23 at or after age 47 years.  Meningococcal  vaccine. Adults with  asplenia or persistent complement component deficiencies should receive 2 doses of quadrivalent meningococcal conjugate (MenACWY-D) vaccine. The doses should be obtained at least 2 months apart. Microbiologists working with certain meningococcal bacteria, [employer] recruits, people at risk during an outbreak, and people who travel to or live in countries with a high rate of meningitis should be immunized. A first-year college student up through age 83 years who is living in a residence hall should receive a dose if she did not receive a dose on or after her 16th birthday. Adults who have certain high-risk conditions should receive one or more doses of vaccine.  Hepatitis A vaccine. Adults who wish to be protected from this disease, have certain high-risk conditions, work with hepatitis A-infected animals, work in hepatitis A research labs, or travel to or work in countries with a high rate of hepatitis A should be immunized. Adults who were previously unvaccinated and who anticipate close contact with an international adoptee during the first 60 days after arrival in the [place] from a country with a high rate of hepatitis A should be immunized.  Hepatitis B vaccine. Adults who wish to be protected from this disease, have certain high-risk conditions, may be exposed to blood or other infectious body fluids, are household contacts or sex partners of hepatitis B positive people, are clients or workers in certain care facilities, or travel to or work in countries with a high rate of hepatitis B should be immunized.  Haemophilus influenzae type b (Hib) vaccine. A previously unvaccinated person with asplenia or sickle cell disease or having a scheduled splenectomy should receive 1 dose of Hib vaccine. Regardless of previous immunization, a recipient of a hematopoietic stem cell transplant should receive a 3-dose series 6 12 months after her successful transplant. Hib vaccine is not  recommended for adults with HIV infection. Preventive Services / Frequency Ages 39 to 39years  Blood pressure check.** / Every 1 to 2 years.  Lipid and cholesterol check.** / Every 5 years beginning at age 37.  Clinical breast exam.** / Every 3 years for women in their 13s and 71s.  BRCA-related cancer risk assessment.** / For women who have family members with a BRCA-related cancer (breast, ovarian, tubal, or peritoneal cancers).  Pap test.** / Every 2 years from ages 32 through 12. Every 3 years starting at age 67 through age 46 or 96 with a history of 3 consecutive normal Pap tests.  HPV screening.** / Every 3 years from ages 59 through ages 68 to 56 with a history of 3 consecutive normal Pap tests.  Hepatitis C blood test.** / For any individual with known risks for hepatitis C.  Skin self-exam. / Monthly.  Influenza vaccine. / Every year.  Tetanus, diphtheria, and acellular pertussis (Tdap, Td) vaccine.** / Consult your health care provider. Pregnant women should receive 1 dose of Tdap vaccine during each pregnancy. 1 dose of Td every 10 years.  Varicella vaccine.** / Consult your health care provider. Pregnant females who do not have evidence of immunity should receive the first dose after pregnancy.  HPV vaccine. / 3 doses over 6 months, if 92 and younger. The vaccine is not recommended for use in pregnant females. However, pregnancy testing is not needed before receiving a dose.  Measles, mumps, rubella (MMR) vaccine.** / You need at least 1 dose of MMR if you were born in 1957 or later. You may also need a 2nd dose. For females of childbearing age, rubella immunity should be determined. If there  is no evidence of immunity, females who are not pregnant should be vaccinated. If there is no evidence of immunity, females who are pregnant should delay immunization until after pregnancy.  Pneumococcal 13-valent conjugate (PCV13) vaccine.** / Consult your health care  provider.  Pneumococcal polysaccharide (PPSV23) vaccine.** / 1 to 2 doses if you smoke cigarettes or if you have certain conditions.  Meningococcal vaccine.** / 1 dose if you are age 27 to 61 years and a Market researcher living in a residence hall, or have one of several medical conditions, you need to get vaccinated against meningococcal disease. You may also need additional booster doses.  Hepatitis A vaccine.** / Consult your health care provider.  Hepatitis B vaccine.** / Consult your health care provider.  Haemophilus influenzae type b (Hib) vaccine.** / Consult your health care provider. Ages 32 to 64years  Blood pressure check.** / Every 1 to 2 years.  Lipid and cholesterol check.** / Every 5 years beginning at age 78 years.  Lung cancer screening. / Every year if you are aged 36 80 years and have a 30-pack-year history of smoking and currently smoke or have quit within the past 15 years. Yearly screening is stopped once you have quit smoking for at least 15 years or develop a health problem that would prevent you from having lung cancer treatment.  Clinical breast exam.** / Every year after age 64 years.  BRCA-related cancer risk assessment.** / For women who have family members with a BRCA-related cancer (breast, ovarian, tubal, or peritoneal cancers).  Mammogram.** / Every year beginning at age 13 years and continuing for as long as you are in good health. Consult with your health care provider.  Pap test.** / Every 3 years starting at age 66 years through age 26 or 42 years with a history of 3 consecutive normal Pap tests.  HPV screening.** / Every 3 years from ages 12 years through ages 49 to 84 years with a history of 3 consecutive normal Pap tests.  Fecal occult blood test (FOBT) of stool. / Every year beginning at age 20 years and continuing until age 25 years. You may not need to do this test if you get a colonoscopy every 10 years.  Flexible sigmoidoscopy or  colonoscopy.** / Every 5 years for a flexible sigmoidoscopy or every 10 years for a colonoscopy beginning at age 34 years and continuing until age 17 years.  Hepatitis C blood test.** / For all people born from [birth year] through 1965 and any individual with known risks for hepatitis C.  Skin self-exam. / Monthly.  Influenza vaccine. / Every year.  Tetanus, diphtheria, and acellular pertussis (Tdap/Td) vaccine.** / Consult your health care provider. Pregnant women should receive 1 dose of Tdap vaccine during each pregnancy. 1 dose of Td every 10 years.  Varicella vaccine.** / Consult your health care provider. Pregnant females who do not have evidence of immunity should receive the first dose after pregnancy.  Zoster vaccine.** / 1 dose for adults aged 62 years or older.  Measles, mumps, rubella (MMR) vaccine.** / You need at least 1 dose of MMR if you were born in 1957 or later. You may also need a 2nd dose. For females of childbearing age, rubella immunity should be determined. If there is no evidence of immunity, females who are not pregnant should be vaccinated. If there is no evidence of immunity, females who are pregnant should delay immunization until after pregnancy.  Pneumococcal 13-valent conjugate (PCV13) vaccine.** / Consult your health  care provider.  Pneumococcal polysaccharide (PPSV23) vaccine.** / 1 to 2 doses if you smoke cigarettes or if you have certain conditions.  Meningococcal vaccine.** / Consult your health care provider.  Hepatitis A vaccine.** / Consult your health care provider.  Hepatitis B vaccine.** / Consult your health care provider.  Haemophilus influenzae type b (Hib) vaccine.** / Consult your health care provider. Ages 50 years and over  Blood pressure check.** / Every 1 to 2 years.  Lipid and cholesterol check.** / Every 5 years beginning at age 34 years.  Lung cancer screening. / Every year if you are aged 31 80 years and have a 30-pack-year history of  smoking and currently smoke or have quit within the past 15 years. Yearly screening is stopped once you have quit smoking for at least 15 years or develop a health problem that would prevent you from having lung cancer treatment.  Clinical breast exam.** / Every year after age 63 years.  BRCA-related cancer risk assessment.** / For women who have family members with a BRCA-related cancer (breast, ovarian, tubal, or peritoneal cancers).  Mammogram.** / Every year beginning at age 73 years and continuing for as long as you are in good health. Consult with your health care provider.  Pap test.** / Every 3 years starting at age 69 years through age 37 or 10 years with 3 consecutive normal Pap tests. Testing can be stopped between 65 and 70 years with 3 consecutive normal Pap tests and no abnormal Pap or HPV tests in the past 10 years.  HPV screening.** / Every 3 years from ages 73 years through ages 40 or 37 years with a history of 3 consecutive normal Pap tests. Testing can be stopped between 65 and 70 years with 3 consecutive normal Pap tests and no abnormal Pap or HPV tests in the past 10 years.  Fecal occult blood test (FOBT) of stool. / Every year beginning at age 82 years and continuing until age 57 years. You may not need to do this test if you get a colonoscopy every 10 years.  Flexible sigmoidoscopy or colonoscopy.** / Every 5 years for a flexible sigmoidoscopy or every 10 years for a colonoscopy beginning at age 70 years and continuing until age 49 years.  Hepatitis C blood test.** / For all people born from [birth year] through 1965 and any individual with known risks for hepatitis C.  Osteoporosis screening.** / A one-time screening for women ages 3 years and over and women at risk for fractures or osteoporosis.  Skin self-exam. / Monthly.  Influenza vaccine. / Every year.  Tetanus, diphtheria, and acellular pertussis (Tdap/Td) vaccine.** / 1 dose of Td every 10 years.  Varicella  vaccine.** / Consult your health care provider.  Zoster vaccine.** / 1 dose for adults aged 21 years or older.  Pneumococcal 13-valent conjugate (PCV13) vaccine.** / Consult your health care provider.  Pneumococcal polysaccharide (PPSV23) vaccine.** / 1 dose for all adults aged 49 years and older.  Meningococcal vaccine.** / Consult your health care provider.  Hepatitis A vaccine.** / Consult your health care provider.  Hepatitis B vaccine.** / Consult your health care provider.  Haemophilus influenzae type b (Hib) vaccine.** / Consult your health care provider. ** Family history and personal history of risk and conditions may change your health care provider's recommendations. Document Released: 05/08/2001 Document Revised: 12/31/2012 Document Reviewed: 08/07/2010 [facility] Patient Information 2014 [place], [place].

## 2013-07-10 NOTE — Progress Notes (Signed)
Patient ID: Carrie Elliott, female   DOB: Aug 14, 1950, 63 y.o.   MRN: [id number] [place] [id number] 1950-10-22 07/10/2013      Progress Note-Follow Up  Subjective  Chief Complaint  Chief Complaint  Patient presents with  . Establish Care    Pt new to establish care.  Has had intermittent dizziness x 2 weeks.     HPI  Patient is a 63 year old female in today for routine medical care. She is here to establish care. Had an episode of vertigo couple of weeks ago but is improving. Still will note some trouble when moving quickly. No fevers and chills. Did have a recent cold but it is improving. Has a complicated GI history. Had esophageal strictures and a stent was placed but unfortunately had a puncture. Has a long history of allergies. Denies any present illness. Has had cardiac workup including stress echo in past for chest pain but it was found to be GI related. Denies CP/palp/SOB/HA/congestion/fevers or GU c/o. Taking meds as prescribed  Past Medical History  Diagnosis Date  . Allergic rhinitis   . History of colonic polyps   . Arthritis   . Reflux     cough  . Cystocele     grade 2  . Rectocele     grade 2  . IBS (irritable bowel syndrome)   . Rosacea   . Sternal fracture     1986  . GERD (gastroesophageal reflux disease)   . HTN (hypertension) 04/23/2013    Past Surgical History  Procedure Laterality Date  . Appendectomy  1989  . Cholecystectomy  1989  . Atypical nevus excision    . Hemorrhoid surgery  2005  . Left oophorectomy  0938    complicated by ureteral injuery requiring a ureteral stent   . Wisdom teeth removal  1964  . Total abdominal hysterectomy  2000  . Tubal ligation  1981  . Colonoscopy  2011  . Esophagogastroduodenoscopy  2011    with esophageal stent  . Mmk / anterior vesicourethropexy / urethropexy  2000  . Cystoscopy w/ ureteral stent removal  1999    Family History  Problem Relation Age of Onset  . Colon polyps Father   . Hypertension  Father   . Heart failure Father   . Melanoma Father   . Heart attack Father     x2  . Osteoporosis Mother   . Transient ischemic attack Mother   . Cancer Mother     colon  . Diabetes Paternal Grandmother   . Stroke Maternal Grandmother   . Stroke Maternal Grandfather   . Heart disease Maternal Grandfather     History   Social History  . Marital Status: Married    Spouse Name: N/A    Number of Children: N/A  . Years of Education: N/A   Occupational History  . Not on file.   Social History Main Topics  . Smoking status: Never Smoker   . Smokeless tobacco: Not on file  . Alcohol Use: No  . Drug Use: No  . Sexual Activity: Not on file   Other Topics Concern  . Not on file   Social History Narrative  . No narrative on file    Current Outpatient Prescriptions on File Prior to Visit  Medication Sig Dispense Refill  . acetaminophen (TYLENOL) 500 MG tablet Take 1,000 mg by mouth every 6 (six) hours as needed for headache (pain).      . benzocaine (ORAJEL) 10 %  mucosal gel Use as directed 1 application in the mouth or throat as needed for mouth pain.      . ciclesonide (OMNARIS) 50 MCG/ACT nasal spray Place 2 sprays into both nostrils daily.       Marland Kitchen esomeprazole (NEXIUM) 40 MG capsule Take 40 mg by mouth 2 (two) times daily.        . fexofenadine (ALLEGRA) 180 MG tablet Take 180 mg by mouth daily.        . Lidocaine-Hydrocortisone Ace 3-0.5 % CREA Apply topically.      . meclizine (ANTIVERT) 25 MG tablet Take 25 mg by mouth 3 (three) times daily as needed for dizziness.      . metoprolol (TOPROL-XL) 50 MG 24 hr tablet Take 50 mg by mouth daily.       . ranitidine (ZANTAC) 150 MG tablet Take 300 mg by mouth daily as needed for heartburn.      Marland Kitchen SIMPLY SALINE NA Place into the nose as needed.       No current facility-administered medications on file prior to visit.    Allergies  Allergen Reactions  . Cefdinir     HIVES  . Celebrex [Celecoxib]     ABDOMINAL PAIN   .  Ciprofloxacin     ABDOMINAL PAIN   . Codeine     NAUSEA VOMITING   . Omni-Pac     HIVES  . Pseudoephedrine Hcl Er     TACHYCARDIA     Review of Systems  Review of Systems  Constitutional: Negative for fever and malaise/fatigue.  HENT: Negative for congestion.   Eyes: Negative for discharge.  Respiratory: Negative for shortness of breath.   Cardiovascular: Negative for chest pain, palpitations and leg swelling.  Gastrointestinal: Negative for nausea, abdominal pain and diarrhea.  Genitourinary: Negative for dysuria.  Musculoskeletal: Negative for falls.  Skin: Negative for rash.  Neurological: Negative for loss of consciousness and headaches.  Endo/Heme/Allergies: Negative for polydipsia.  Psychiatric/Behavioral: Negative for depression and suicidal ideas. The patient is not nervous/anxious and does not have insomnia.     Objective  BP 120/70  Pulse 79  Temp(Src) 97.9 F (36.6 C) (Oral)  Resp 16  Ht 5\' 1"  (1.549 m)  Wt 164 lb 1.9 oz (74.444 kg)  BMI 31.03 kg/m2  SpO2 99%  Physical Exam  Physical Exam  Constitutional: She is oriented to person, place, and time and well-developed, well-nourished, and in no distress. No distress.  HENT:  Head: Normocephalic and atraumatic.  Right Ear: External ear normal.  Left Ear: External ear normal.  Nose: Nose normal.  Mouth/Throat: Oropharynx is clear and moist. No oropharyngeal exudate.  Eyes: Conjunctivae are normal. Pupils are equal, round, and reactive to light. Right eye exhibits no discharge. Left eye exhibits no discharge. No scleral icterus.  Neck: Normal range of motion. Neck supple. No thyromegaly present.  Cardiovascular: Normal rate, regular rhythm, normal heart sounds and intact distal pulses.   No murmur heard. Pulmonary/Chest: Effort normal and breath sounds normal. No respiratory distress. She has no wheezes. She has no rales.  Abdominal: Soft. Bowel sounds are normal. She exhibits no distension and no mass.  There is no tenderness.  Musculoskeletal: Normal range of motion. She exhibits no edema and no tenderness.  Lymphadenopathy:    She has no cervical adenopathy.  Neurological: She is alert and oriented to person, place, and time. She has normal reflexes. No cranial nerve deficit. Coordination normal.  Skin: Skin is warm and dry. No rash  noted. She is not diaphoretic.  Psychiatric: Mood, memory and affect normal.    Lab Results  Component Value Date   TSH 2.439 04/23/2013   Lab Results  Component Value Date   WBC 4.3 04/25/2013   HGB 11.1* 04/25/2013   HCT 33.4* 04/25/2013   MCV 84.8 04/25/2013   PLT 199 04/25/2013   Lab Results  Component Value Date   CREATININE 0.84 04/25/2013   BUN 8 04/25/2013   NA 142 04/25/2013   K 3.9 04/25/2013   CL 107 04/25/2013   CO2 23 04/25/2013   Lab Results  Component Value Date   ALT 11 04/25/2013   AST 13 04/25/2013   ALKPHOS 56 04/25/2013   BILITOT 0.3 04/25/2013   Lab Results  Component Value Date   CHOL 140 04/24/2013   Lab Results  Component Value Date   HDL 47 04/24/2013   Lab Results  Component Value Date   LDLCALC 70 04/24/2013   Lab Results  Component Value Date   TRIG 113 04/24/2013   Lab Results  Component Value Date   CHOLHDL 3.0 04/24/2013     Assessment & Plan  HTN (hypertension) Well controlled, no changes to meds. Encouraged heart healthy diet such as the DASH diet and exercise as tolerated.   IBS (irritable bowel syndrome) Encouraged increased hydration and fiber in diet. Daily probiotics. If bowels not moving can use MOM 2 tbls po in 4 oz of warm prune juice by mouth every 2-3 days. If no results then repeat in 4 hours with  Dulcolax suppository pr, may repeat again in 4 more hours as needed. Seek care if symptoms worsen. Consider daily Miralax and/or Dulcolax if symptoms persist.   Reflux Has had a stent   Allergic rhinitis Fexofenadine daily. Has had allergy shots in past.   Benign paroxysmal positional  vertigo Encouraged increased hydration. May use Meclizine prn

## 2013-07-10 NOTE — Progress Notes (Signed)
Pre visit review using our clinic review tool, if applicable. No additional management support is needed unless otherwise documented below in the visit note. 

## 2013-07-12 ENCOUNTER — Encounter: Payer: Self-pay | Admitting: Family Medicine

## 2013-07-12 DIAGNOSIS — H8110 Benign paroxysmal vertigo, unspecified ear: Secondary | ICD-10-CM

## 2013-07-12 HISTORY — DX: Benign paroxysmal vertigo, unspecified ear: H81.10

## 2013-07-12 NOTE — Assessment & Plan Note (Signed)
Encouraged increased hydration and fiber in diet. Daily probiotics. If bowels not moving can use MOM 2 tbls po in 4 oz of warm prune juice by mouth every 2-3 days. If no results then repeat in 4 hours with  Dulcolax suppository pr, may repeat again in 4 more hours as needed. Seek care if symptoms worsen. Consider daily Miralax and/or Dulcolax if symptoms persist.  

## 2013-07-12 NOTE — Assessment & Plan Note (Signed)
Encouraged increased hydration. May use Meclizine prn

## 2013-07-12 NOTE — Assessment & Plan Note (Signed)
Well controlled, no changes to meds. Encouraged heart healthy diet such as the DASH diet and exercise as tolerated.  °

## 2013-07-12 NOTE — Assessment & Plan Note (Addendum)
Fexofenadine daily. Has had allergy shots in past.

## 2013-07-12 NOTE — Assessment & Plan Note (Signed)
Has had a stent

## 2013-07-13 ENCOUNTER — Telehealth: Payer: Self-pay | Admitting: Family Medicine

## 2013-07-13 NOTE — Telephone Encounter (Signed)
Relevant patient education assigned to patient using Emmi. ° °

## 2013-07-24 ENCOUNTER — Ambulatory Visit: Payer: 59 | Admitting: Family Medicine

## 2013-08-08 ENCOUNTER — Encounter: Payer: Self-pay | Admitting: Family Medicine

## 2013-08-14 ENCOUNTER — Other Ambulatory Visit: Payer: Self-pay | Admitting: Family Medicine

## 2013-08-14 DIAGNOSIS — H8110 Benign paroxysmal vertigo, unspecified ear: Secondary | ICD-10-CM

## 2013-08-19 ENCOUNTER — Ambulatory Visit: Payer: 59 | Attending: Family Medicine | Admitting: Rehabilitation

## 2013-08-19 DIAGNOSIS — H8110 Benign paroxysmal vertigo, unspecified ear: Secondary | ICD-10-CM | POA: Insufficient documentation

## 2013-08-19 DIAGNOSIS — I10 Essential (primary) hypertension: Secondary | ICD-10-CM | POA: Diagnosis not present

## 2013-08-19 DIAGNOSIS — IMO0001 Reserved for inherently not codable concepts without codable children: Secondary | ICD-10-CM | POA: Insufficient documentation

## 2013-08-19 DIAGNOSIS — R42 Dizziness and giddiness: Secondary | ICD-10-CM | POA: Diagnosis not present

## 2013-08-26 ENCOUNTER — Ambulatory Visit: Payer: 59 | Admitting: Rehabilitation

## 2013-08-31 ENCOUNTER — Ambulatory Visit: Payer: 59 | Admitting: Family Medicine

## 2013-10-20 ENCOUNTER — Telehealth: Payer: Self-pay

## 2013-10-20 NOTE — Telephone Encounter (Signed)
Patients spouse left a message stating that he would like to know if his records have came from [facility] ([facility])? Pt stated in message he has requested these twice?  Please advise? I don't see them in the chart?

## 2013-10-20 NOTE — Telephone Encounter (Signed)
Please call about records

## 2013-10-21 NOTE — Telephone Encounter (Signed)
Can you please check on this?

## 2013-10-26 NOTE — Telephone Encounter (Signed)
Carrie Elliott will you please call [facility] and request the records?  thanks

## 2013-10-27 ENCOUNTER — Telehealth: Payer: Self-pay | Admitting: Family Medicine

## 2013-10-27 NOTE — Telephone Encounter (Signed)
Another note is on this

## 2013-10-27 NOTE — Telephone Encounter (Signed)
Received medical records from [facility]

## 2014-02-02 ENCOUNTER — Other Ambulatory Visit: Payer: Self-pay | Admitting: Family Medicine

## 2014-02-02 ENCOUNTER — Telehealth: Payer: Self-pay | Admitting: Family Medicine

## 2014-02-02 DIAGNOSIS — H8110 Benign paroxysmal vertigo, unspecified ear: Secondary | ICD-10-CM

## 2014-02-02 NOTE — Telephone Encounter (Signed)
Caller name: [name] Relation to pt: self Call back number: [phone] Pharmacy:  Reason for call:   Patient states that she is having vertigo again and would like to be referred back to PT(across the hall) because that really helped last time

## 2014-02-10 ENCOUNTER — Ambulatory Visit: Payer: 59 | Attending: Family Medicine | Admitting: Rehabilitation

## 2014-02-10 DIAGNOSIS — H8110 Benign paroxysmal vertigo, unspecified ear: Secondary | ICD-10-CM | POA: Diagnosis present

## 2014-02-10 DIAGNOSIS — R42 Dizziness and giddiness: Secondary | ICD-10-CM | POA: Insufficient documentation

## 2014-02-11 ENCOUNTER — Encounter: Payer: Self-pay | Admitting: Medical

## 2014-02-11 ENCOUNTER — Ambulatory Visit (INDEPENDENT_AMBULATORY_CARE_PROVIDER_SITE_OTHER): Payer: 59 | Admitting: Medical

## 2014-02-11 VITALS — BP 126/85 | HR 92 | Temp 98.5°F | Ht 61.0 in | Wt 166.0 lb

## 2014-02-11 DIAGNOSIS — IMO0001 Reserved for inherently not codable concepts without codable children: Secondary | ICD-10-CM | POA: Insufficient documentation

## 2014-02-11 DIAGNOSIS — M791 Myalgia: Secondary | ICD-10-CM

## 2014-02-11 DIAGNOSIS — J069 Acute upper respiratory infection, unspecified: Secondary | ICD-10-CM

## 2014-02-11 DIAGNOSIS — J029 Acute pharyngitis, unspecified: Secondary | ICD-10-CM

## 2014-02-11 DIAGNOSIS — M609 Myositis, unspecified: Secondary | ICD-10-CM

## 2014-02-11 LAB — POCT INFLUENZA A/B
INFLUENZA A, POC: NEGATIVE
INFLUENZA B, POC: NEGATIVE

## 2014-02-11 LAB — POCT RAPID STREP A (OFFICE): Rapid Strep A Screen: NEGATIVE

## 2014-02-11 MED ORDER — BENZONATATE 100 MG PO CAPS: 100.0000 mg | ORAL_CAPSULE | Freq: Three times a day (TID) | ORAL | Status: DC | PRN

## 2014-02-11 MED ORDER — AZITHROMYCIN 250 MG PO TABS
ORAL_TABLET | ORAL | Status: DC
Start: 2014-02-11 — End: 2014-08-24

## 2014-02-11 NOTE — Assessment & Plan Note (Signed)
Rapid flu was negative.

## 2014-02-11 NOTE — Progress Notes (Signed)
Pre visit review using our clinic review tool, if applicable. No additional management support is needed unless otherwise documented below in the visit note. 

## 2014-02-11 NOTE — Progress Notes (Signed)
Subjective:    Patient ID: Carrie Elliott, female    DOB: 06-02-1950, 63 y.o.   MRN: [id number]  HPI   Pt has cough on and off for 3 days(Occasional productive) with nasal congestion. Some headache. 1 days of mild achiness back and shoulders. Pt did take flu vaccine this year about 1 month ago.   Pt mild dull headache and worse when cough. No neck pain or stiffness.  Mild frontal sinus pressure.   No wheezing. Non smoker.  Lt submandibular lymph  node swollen with  mild sorethroat.  Past Medical History  Diagnosis Date  . Allergic rhinitis   . History of colonic polyps   . Reflux     cough  . Cystocele     grade 2  . Rectocele     grade 2  . IBS (irritable bowel syndrome)   . Rosacea   . Sternal fracture     1986  . GERD (gastroesophageal reflux disease)   . HTN (hypertension) 04/23/2013  . Chicken pox   . Measles   . Mumps   . Allergy   . Arthritis     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, shoulders, knees  . Endometriosis     with Menometrorhaghia   . Benign paroxysmal positional vertigo 07/12/2013    History   Social History  . Marital Status: Married    Spouse Name: N/A    Number of Children: N/A  . Years of Education: N/A   Occupational History  . Not on file.   Social History Main Topics  . Smoking status: Never Smoker   . Smokeless tobacco: Not on file  . Alcohol Use: No  . Drug Use: No  . 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 Not on file   Social History Narrative    Past Surgical History  Procedure Laterality Date  . Appendectomy  1989  . Cholecystectomy  1989  . Atypical nevus excision    . Hemorrhoid surgery  2005  . Left oophorectomy  7408    complicated by ureteral injuery requiring a ureteral stent   . Wisdom teeth removal  1964  . Total abdominal hysterectomy  2000  . Tubal ligation  1981  . Colonoscopy  2011  . Esophagogastroduodenoscopy  2011    with  esophageal stent  . Mmk / anterior vesicourethropexy / urethropexy  2000  . Cystoscopy w/ ureteral stent removal  1999  . Tonsillectomy and adenoidectomy      Family History  Problem Relation Age of Onset  . Colon polyps Father   . Hypertension Father   . Heart failure Father   . Melanoma Father     multiple  . Heart attack Father     x2  . Heart disease Father     Bundle Block  . Meniere's disease Father   . Cancer Father     melanoma  . Osteoporosis Mother   . Transient ischemic attack Mother   . Cancer Mother     colon  . Heart disease Mother     afib  . Hyperlipidemia Mother   . Diabetes Paternal Grandmother   . Stroke Maternal Grandmother   . Cancer Maternal Grandmother     skin, melanoma, sarcoma  . Colon polyps Maternal Grandmother   . Diverticulitis Maternal Grandmother   . Stroke Maternal Grandfather   . Heart disease Maternal Grandfather   .  Obesity Brother     Allergies  Allergen Reactions  . Cefdinir     HIVES  . Celebrex [Celecoxib]     ABDOMINAL PAIN   . Ciprofloxacin     ABDOMINAL PAIN   . Codeine     NAUSEA VOMITING   . Pseudoephedrine Hcl Er     TACHYCARDIA     Current Outpatient Prescriptions on File Prior to Visit  Medication Sig Dispense Refill  . acetaminophen (TYLENOL) 500 MG tablet Take 1,000 mg by mouth every 6 (six) hours as needed for headache (pain).    . benzocaine (ORAJEL) 10 % mucosal gel Use as directed 1 application in the mouth or throat as needed for mouth pain.    . ciclesonide (OMNARIS) 50 MCG/ACT nasal spray Place 2 sprays into both nostrils daily.     Marland Kitchen esomeprazole (NEXIUM) 40 MG capsule Take 40 mg by mouth 2 (two) times daily.      . fexofenadine (ALLEGRA) 180 MG tablet Take 180 mg by mouth daily.      . Lidocaine-Hydrocortisone Ace 3-0.5 % CREA Apply topically.    . meclizine (ANTIVERT) 25 MG tablet Take 25 mg by mouth 3 (three) times daily as needed for dizziness.    . metoprolol succinate (TOPROL-XL) 50 MG 24 hr  tablet Take 1 tablet (50 mg total) by mouth daily. 90 tablet 3  . ranitidine (ZANTAC) 150 MG tablet Take 300 mg by mouth daily as needed for heartburn.    Marland Kitchen SIMPLY SALINE NA Place into the nose as needed.     No current facility-administered medications on file prior to visit.    BP 126/85 mmHg  Pulse 92  Temp(Src) 98.5 F (36.9 C) (Oral)  Ht 5\' 1"  (1.549 m)  Wt 166 lb (75.297 kg)  BMI 31.38 kg/m2  SpO2 97%       Review of Systems  Constitutional: Negative for fever, chills and fatigue.  HENT: Positive for congestion and sinus pressure.   Eyes: Negative.   Respiratory: Positive for cough. Negative for choking, chest tightness and wheezing.   Gastrointestinal: Negative.   Genitourinary: Negative.   Musculoskeletal: Negative for joint swelling, neck pain and neck stiffness.       Mild achiness.  Skin: Negative.   Neurological: Positive for headaches.       Mild dull ha.  Hematological: Positive for adenopathy.       Lt submandibular node.       Objective:   Physical Exam   General  Mental Status - Alert. General Appearance - Well groomed. Not in acute distress.  Skin Rashes- No Rashes.  HEENT Head- Normal. Ear Auditory Canal - Left- Normal. Right - Normal.Tympanic Membrane- Left- Normal. Right- Normal. Eye Sclera/Conjunctiva- Left- Normal. Right- Normal. Nose & Sinuses Nasal Mucosa- Left- Faint  Boggy + Congested. Right- Faint  Boggy + Congested. Mouth & Throat Lips: Upper Lip- Normal: no dryness, cracking, pallor, cyanosis, or vesicular eruption. Lower Lip-Normal: no dryness, cracking, pallor, cyanosis or vesicular eruption. Buccal Mucosa- Bilateral- No Aphthous ulcers. Oropharynx- No Discharge or Erythema. Tonsils: Characteristics- Bilateral- Mild Erythema or Congestion. Size/Enlargement- Bilateral- No enlargement. Discharge- bilateral-None.  Neck Neck- Supple. No Masses.   Chest and Lung Exam Auscultation: Breath  Sounds:-Normal.  Cardiovascular Auscultation:Rythm- Regular.  Murmurs & Other Heart Sounds:Ausculatation of the heart reveal- No Murmurs.  Lymphatic Head & Neck General Head & Neck Lymphatics: Bilateral: Description- Mild left lymph submandibular node enlargement.         Assessment & Plan:

## 2014-02-11 NOTE — Patient Instructions (Addendum)
You appear to have uri type signs and symptoms. But some bodyaches and sorethroat as well(with small submandibular node swollen.) With your line of work thought best to do rapid strep and rapid flu. Both test were negative.   I am prescribing benzonatate for cough, and if your symptoms worsen(sinus pressure increase, productive cough, more sore throat) over the weekend then start azithromycin antibiotic. You may benefit from otc  nasal steroid but make sure OK since per computer contraindication with pseudoephedrine allergy.  Follow up in 7 days or as needed.

## 2014-02-11 NOTE — Assessment & Plan Note (Signed)
Rapid strep was negative. So at this point viral vs pnd. Clinically not real suspicious for strep.

## 2014-02-11 NOTE — Assessment & Plan Note (Signed)
You appear to have uri type signs and symptoms. But some bodyaches and sorethroat as well(with small submandibular node swollen.) With your line of work thought best to do rapid strep and rapid flu. Both test were negative.   I am prescribing benzonatate for cough, and if your symptoms worsen(sinus pressure increase, productive cough, more sore throat) over the weekend then start azithromycin antibiotic. You may benefit from otc  nasal steroid but make sure OK since per computer contraindication with pseudoephedrine allergy

## 2014-07-05 ENCOUNTER — Other Ambulatory Visit: Payer: Self-pay | Admitting: Family Medicine

## 2014-07-05 DIAGNOSIS — Z1231 Encounter for screening mammogram for malignant neoplasm of breast: Secondary | ICD-10-CM

## 2014-08-02 ENCOUNTER — Telehealth: Payer: Self-pay | Admitting: Family Medicine

## 2014-08-02 NOTE — Telephone Encounter (Signed)
Pre Visit letter sent  °

## 2014-08-06 ENCOUNTER — Ambulatory Visit (HOSPITAL_COMMUNITY): Payer: 59

## 2014-08-16 ENCOUNTER — Other Ambulatory Visit: Payer: Self-pay | Admitting: Family Medicine

## 2014-08-20 ENCOUNTER — Telehealth: Payer: Self-pay | Admitting: *Deleted

## 2014-08-20 NOTE — Telephone Encounter (Signed)
Unable to reach patient at time of Pre Visit Call.   

## 2014-08-24 ENCOUNTER — Ambulatory Visit (INDEPENDENT_AMBULATORY_CARE_PROVIDER_SITE_OTHER): Payer: 59 | Admitting: Family Medicine

## 2014-08-24 ENCOUNTER — Encounter: Payer: Self-pay | Admitting: Family Medicine

## 2014-08-24 ENCOUNTER — Ambulatory Visit (HOSPITAL_BASED_OUTPATIENT_CLINIC_OR_DEPARTMENT_OTHER)
Admission: RE | Admit: 2014-08-24 | Discharge: 2014-08-24 | Disposition: A | Discharge status: Home / Self Care | Payer: 59 | Source: Ambulatory Visit | Attending: Family Medicine | Admitting: Family Medicine

## 2014-08-24 ENCOUNTER — Other Ambulatory Visit: Payer: Self-pay | Admitting: Family Medicine

## 2014-08-24 VITALS — BP 122/90 | HR 84 | Temp 98.2°F | Ht 61.0 in | Wt 167.5 lb

## 2014-08-24 DIAGNOSIS — M25551 Pain in right hip: Secondary | ICD-10-CM | POA: Insufficient documentation

## 2014-08-24 DIAGNOSIS — Z124 Encounter for screening for malignant neoplasm of cervix: Secondary | ICD-10-CM | POA: Insufficient documentation

## 2014-08-24 DIAGNOSIS — G8929 Other chronic pain: Secondary | ICD-10-CM | POA: Insufficient documentation

## 2014-08-24 DIAGNOSIS — L578 Other skin changes due to chronic exposure to nonionizing radiation: Secondary | ICD-10-CM | POA: Diagnosis not present

## 2014-08-24 DIAGNOSIS — R350 Frequency of micturition: Secondary | ICD-10-CM | POA: Diagnosis not present

## 2014-08-24 DIAGNOSIS — D649 Anemia, unspecified: Secondary | ICD-10-CM | POA: Insufficient documentation

## 2014-08-24 DIAGNOSIS — I10 Essential (primary) hypertension: Secondary | ICD-10-CM | POA: Diagnosis not present

## 2014-08-24 DIAGNOSIS — Z0000 Encounter for general adult medical examination without abnormal findings: Secondary | ICD-10-CM | POA: Diagnosis not present

## 2014-08-24 DIAGNOSIS — K589 Irritable bowel syndrome without diarrhea: Secondary | ICD-10-CM

## 2014-08-24 HISTORY — DX: Anemia, unspecified: D64.9

## 2014-08-24 LAB — URINALYSIS, ROUTINE W REFLEX MICROSCOPIC
BILIRUBIN URINE: NEGATIVE
KETONES UR: NEGATIVE
LEUKOCYTES UA: NEGATIVE
Nitrite: NEGATIVE
Specific Gravity, Urine: 1.02 (ref 1.000–1.030)
Total Protein, Urine: NEGATIVE
URINE GLUCOSE: NEGATIVE
UROBILINOGEN UA: 0.2 (ref 0.0–1.0)
pH: 6 (ref 5.0–8.0)

## 2014-08-24 LAB — COMPREHENSIVE METABOLIC PANEL
ALK PHOS: 64 U/L (ref 39–117)
ALT: 21 U/L (ref 0–35)
AST: 20 U/L (ref 0–37)
Albumin: 4.4 g/dL (ref 3.5–5.2)
BILIRUBIN TOTAL: 0.3 mg/dL (ref 0.2–1.2)
BUN: 16 mg/dL (ref 6–23)
CALCIUM: 9.6 mg/dL (ref 8.4–10.5)
CO2: 25 mEq/L (ref 19–32)
Chloride: 105 mEq/L (ref 96–112)
Creatinine, Ser: 0.82 mg/dL (ref 0.40–1.20)
GFR: 74.62 mL/min (ref >60.00)
Glucose, Bld: 90 mg/dL (ref 70–99)
Potassium: 4.2 mEq/L (ref 3.5–5.1)
SODIUM: 139 meq/L (ref 135–145)
TOTAL PROTEIN: 7.8 g/dL (ref 6.0–8.3)

## 2014-08-24 LAB — CBC
HCT: 37.4 % (ref 36.0–46.0)
Hemoglobin: 12.3 g/dL (ref 12.0–15.0)
MCHC: 33 g/dL (ref 30.0–36.0)
MCV: 80.7 fl (ref 78.0–100.0)
Platelets: 273 10*3/uL (ref 150.0–400.0)
RBC: 4.63 Mil/uL (ref 3.87–5.11)
RDW: 15.4 % (ref 11.5–15.5)
WBC: 5.9 10*3/uL (ref 4.0–10.5)

## 2014-08-24 LAB — TSH: TSH: 0.78 u[IU]/mL (ref 0.35–4.50)

## 2014-08-24 MED ORDER — MECLIZINE HCL 25 MG PO TABS: 25.0000 mg | ORAL_TABLET | Freq: Three times a day (TID) | ORAL | Status: DC | PRN

## 2014-08-24 NOTE — Progress Notes (Signed)
Pre visit review using our clinic review tool, if applicable. No additional management support is needed unless otherwise documented below in the visit note. 

## 2014-08-24 NOTE — Assessment & Plan Note (Signed)
Well controlled, no changes to meds. Encouraged heart healthy diet such as the DASH diet and exercise as tolerated.  °

## 2014-08-24 NOTE — Assessment & Plan Note (Signed)
Pap today, no concerns on exam.  Menarche at 12 Regular and moderate to heavy flow  history of abnormal pap in past, 1 or 2 abnormals many years ago normalized on recheck H/o ovarian cysts, tubal ligation, oophorectomy then TAH for heavy bleeding from Endometfriosis G2P2, s/p 2 svd history of abnormal MGMs with one FN biopsy which was benign and resolved in left breast, next is scheduled for 09/10/14 No concerns 6-7 years Vivelle dot, weaned off years ago

## 2014-08-24 NOTE — Patient Instructions (Addendum)
Tylenol 2 twice daily Salon Pas patches twice daily Consider sports med if hip worsens Consider Ca/MG/Zn tab at bed Biotin for nails   Preventive Care for Adults A healthy lifestyle and preventive care can promote health and wellness. Preventive health guidelines for women include the following key practices.  A routine yearly physical is a good way to check with your health care provider about your health and preventive screening. It is a chance to share any concerns and updates on your health and to receive a thorough exam.  Visit your dentist for a routine exam and preventive care every 6 months. Brush your teet use of contact lenses, and other factors. Follow your health care provider's recommendations for frequency of eye exams.  Eat a healthy diet. Foods like vegetables, fruits, whole grains, low-fat dairy products, and lean protein foods contain the nutrients you need without too many calories. Decrease your intake of foods high in solid fats, added sugars, and salt. Eat the right amount of calories for you.Get information about a proper diet from your health care provider, if necessary.  Regular physical exercise is one of the most important things you can do for your health. Most adults should get at least 150 minutes of moderate-intensity exercise (any activity that increases your heart rate and causes you to sweat) each week. In addition, most adults need muscle-strengthening exercises on 2 or more days a week.  Maintain a healthy weight. The body mass index (BMI) is a screening tool to identify possible weight problems. It provides an estimate of body fat based on height and weight. Your health care provider can find your BMI and can help you achieve or maintain a healthy weight.For adults 20 years and older:  A BMI below 18.5 is considered underweight.  A BMI of 18.5 to 24.9 is normal.  A BMI of 25 to 29.9 is considered overweight.  A BMI of 30 and above is considered  obese.  Maintain normal blood lipids and cholesterol levels by exercising and minimizing your intake of saturated fat. Eat a balanced diet with plenty of fruit and vegetables. Blood tests for lipids and cholesterol should begin at age 60 and be repeated every 5 years. If your lipid or cholesterol levels are high, you are over 50, or you are at high risk for heart disease, you may need your cholesterol levels checked more frequently.Ongoing high lipid and cholesterol levels should be treated with medicines if diet and exercise are not working.  If you smoke, find out from your health care provider how to quit. If you do not use tobacco, do not start.  Lung cancer screening is recommended for adults aged 70-80 years who are at high risk for developing lung cancer because of a history of smoking. A yearly low-dose CT scan of the lungs is recommended for people who have at least a 30-pack-year history of smoking and are a current smoker or have quit within the past 15 years. A pack year of smoking is smoking an average of 1 pack of cigarettes a day for 1 year (for example: 1 pack a day for 30 years or 2 packs a day for 15 years). Yearly screening should continue until the smoker has stopped smoking for at least 15 years. Yearly screening should be stopped for people who develop a health problem that would prevent them from having lung cancer treatment.  If you are pregnant, do not drink alcohol. If you are breastfeeding, be very cautious about drinking alcohol. If  you are not pregnant and choose to drink alcohol, do not have more than 1 drink per day. One drink is considered to be 12 ounces (355 mL) of beer, 5 ounces (148 mL) of wine, or 1.5 ounces (44 mL) of liquor.  Avoid use of street drugs. Do not share needles with anyone. Ask for help if you need support or instructions about stopping the use of drugs.  High blood pressure causes heart disease and increases the risk of stroke. Your blood pressure  should be checked at least every 1 to 2 years. Ongoing high blood pressure should be treated with medicines if weight loss and exercise do not work.  If you are 83-25 years old, ask your health care provider if you should take aspirin to prevent strokes.  Diabetes screening involves taking a blood sample to check your fasting blood sugar level. This should be done once every 3 years, after age 74, if you are within normal weight and without risk factors for diabetes. Testing should be considered at a younger age or be carried out more frequently if you are overweight and have at least 1 risk factor for diabetes.  Breast cancer screening is essential preventive care for women. You should practice "breast self-awareness." This means understanding the normal appearance and feel of your breasts and may include breast self-examination. Any changes detected, no matter how small, should be reported to a health care provider. Women in their 19s and 30s should have a clinical breast exam (CBE) by a health care provider as part of a regular health exam every 1 to 3 years. After age 73, women should have a CBE every year. Starting at age 69, women should consider having a mammogram (breast X-ray test) every year. Women who have a family history of breast cancer should talk to their health care provider about genetic screening. Women at a high risk of breast cancer should talk to their health care providers about having an MRI and a mammogram every year.  Breast cancer gene (BRCA)-related cancer risk assessment is recommended for women who have family members with BRCA-related cancers. BRCA-related cancers include breast, ovarian, tubal, and peritoneal cancers. Having family members with these cancers may be associated with an increased risk for harmful changes (mutations) in the breast cancer genes BRCA1 and BRCA2. Results of the assessment will determine the need for genetic counseling and BRCA1 and BRCA2  testing.  Routine pelvic exams to screen for cancer are no longer recommended for nonpregnant women who are considered low risk for cancer of the pelvic organs (ovaries, uterus, and vagina) and who do not have symptoms. Ask your health care provider if a screening pelvic exam is right for you.  If you have had past treatment for cervical cancer or a condition that could lead to cancer, you need Pap tests and screening for cancer for at least 20 years after your treatment. If Pap tests have been discontinued, your risk factors (such as having a new sexual partner) need to be reassessed to determine if screening should be resumed. Some women have medical problems that increase the chance of getting cervical cancer. In these cases, your health care provider may recommend more frequent screening and Pap tests.  The HPV test is an additional test that may be used for cervical cancer screening. The HPV test looks for the virus that can cause the cell changes on the cervix. The cells collected during the Pap test can be tested for HPV. The HPV test could  be used to screen women aged 63 years and older, and should be used in women of any age who have unclear Pap test results. After the age of 72, women should have HPV testing at the same frequency as a Pap test.  Colorectal cancer can be detected and often prevented. Most routine colorectal cancer screening begins at the age of 101 years and continues through age 46 years. However, your health care provider may recommend screening at an earlier age if you have risk factors for colon cancer. On a yearly basis, your health care provider may provide home test kits to check for hidden blood in the stool. Use of a small camera at the end of a tube, to directly examine the colon (sigmoidoscopy or colonoscopy), can detect the earliest forms of colorectal cancer. Talk to your health care provider about this at age 60, when routine screening begins. Direct exam of the colon  should be repeated every 5-10 years through age 7 years, unless early forms of pre-cancerous polyps or small growths are found.  People who are at an increased risk for hepatitis B should be screened for this virus. You are considered at high risk for hepatitis B if:  You were born in a country where hepatitis B occurs often. Talk with your health care provider about which countries are considered high risk.  Your parents were born in a high-risk country and you have not received a shot to protect against hepatitis B (hepatitis B vaccine).  You have HIV or AIDS.  You use needles to inject street drugs.  You live with, or have sex with, someone who has hepatitis B.  You get hemodialysis treatment.  You take certain medicines for conditions like cancer, organ transplantation, and autoimmune conditions.  Hepatitis C blood testing is recommended for all people born from [birth year] through 1965 and any individual with known risks for hepatitis C.  Practice safe sex. Use condoms and avoid high-risk sexual practices to reduce the spread of sexually transmitted infections (STIs). STIs include gonorrhea, chlamydia, syphilis, trichomonas, herpes, HPV, and human immunodeficiency virus (HIV). Herpes, HIV, and HPV are viral illnesses that have no cure. They can result in disability, cancer, and death.  You should be screened for sexually transmitted illnesses (STIs) including gonorrhea and chlamydia if:  You are sexually active and are younger than 24 years.  You are older than 24 years and your health care provider tells you that you are at risk for this type of infection.  Your sexual activity has changed since you were last screened and you are at an increased risk for chlamydia or gonorrhea. Ask your health care provider if you are at risk.  If you are at risk of being infected with HIV, it is recommended that you take a prescription medicine daily to prevent HIV infection. This is called preexposure  prophylaxis (PrEP). You are considered at risk if:  You are a heterosexual woman, are sexually active, and are at increased risk for HIV infection.  You take drugs by injection.  You are sexually active with a partner who has HIV.  Talk with your health care provider about whether you are at high risk of being infected with HIV. If you choose to begin PrEP, you should first be tested for HIV. You should then be tested every 3 months for as long as you are taking PrEP.  Osteoporosis is a disease in which the bones lose minerals and strength with aging. This can result in serious  bone fractures or breaks. The risk of osteoporosis can be identified using a bone density scan. Women ages 72 years and over and women at risk for fractures or osteoporosis should discuss screening with their health care providers. Ask your health care provider whether you should take a calcium supplement or vitamin D to reduce the rate of osteoporosis.  Menopause can be associated with physical symptoms and risks. Hormone replacement therapy is available to decrease symptoms and risks. You should talk to your health care provider about whether hormone replacement therapy is right for you.  Use sunscreen. Apply sunscreen liberally and repeatedly throughout the day. You should seek shade when your shadow is shorter than you. Protect yourself by wearing long sleeves, pants, a wide-brimmed hat, and sunglasses year round, whenever you are outdoors.  Once a month, do a whole body skin exam, using a mirror to look at the skin on your back. Tell your health care provider of new moles, moles that have irregular borders, moles that are larger than a pencil eraser, or moles that have changed in shape or color.  Stay current with required vaccines (immunizations).  Influenza vaccine. All adults should be immunized every year.  Tetanus, diphtheria, and acellular pertussis (Td, Tdap) vaccine. Pregnant women should receive 1 dose of  Tdap vaccine during each pregnancy. The dose should be obtained regardless of the length of time since the last dose. Immunization is preferred during the 27th-36th week of gestation. An adult who has not previously received Tdap or who does not know her vaccine status should receive 1 dose of Tdap. This initial dose should be followed by tetanus and diphtheria toxoids (Td) booster doses every 10 years. Adults with an unknown or incomplete history of completing a 3-dose immunization series with Td-containing vaccines should begin or complete a primary immunization series including a Tdap dose. Adults should receive a Td booster every 10 years.  Varicella vaccine. An adult without evidence of immunity to varicella should receive 2 doses or a second dose if she has previously received 1 dose. Pregnant females who do not have evidence of immunity should receive the first dose after pregnancy. This first dose should be obtained before leaving the health care facility. The second dose should be obtained 4-8 weeks after the first dose.  Human papillomavirus (HPV) vaccine. Females aged 13-26 years who have not received the vaccine previously should obtain the 3-dose series. The vaccine is not recommended for use in pregnant females. However, pregnancy testing is not needed before receiving a dose. If a female is found to be pregnant after receiving a dose, no treatment is needed. In that case, the remaining doses should be delayed until after the pregnancy. Immunization is recommended for any person with an immunocompromised condition through the age of 52 years if she did not get any or all doses earlier. During the 3-dose series, the second dose should be obtained 4-8 weeks after the first dose. The third dose should be obtained 24 weeks after the first dose and 16 weeks after the second dose.  Zoster vaccine. One dose is recommended for adults aged 55 years or older unless certain conditions are  present.  Measles, mumps, and rubella (MMR) vaccine. Adults born before [birth year] generally are considered immune to measles and mumps. Adults born in [birth year] or later should have 1 or more doses of MMR vaccine unless there is a contraindication to the vaccine or there is laboratory evidence of immunity to each of the three diseases. A routine  second dose of MMR vaccine should be obtained at least 28 days after the first dose for students attending postsecondary schools, health care workers, or international travelers. People who received inactivated measles vaccine or an unknown type of measles vaccine during 1963-1967 should receive 2 doses of MMR vaccine. People who received inactivated mumps vaccine or an unknown type of mumps vaccine before 1979 and are at high risk for mumps infection should consider immunization with 2 doses of MMR vaccine. For females of childbearing age, rubella immunity should be determined. If there is no evidence of immunity, females who are not pregnant should be vaccinated. If there is no evidence of immunity, females who are pregnant should delay immunization until after pregnancy. Unvaccinated health care workers born before [birth year] who lack laboratory evidence of measles, mumps, or rubella immunity or laboratory confirmation of disease should consider measles and mumps immunization with 2 doses of MMR vaccine or rubella immunization with 1 dose of MMR vaccine.  Pneumococcal 13-valent conjugate (PCV13) vaccine. When indicated, a person who is uncertain of her immunization history and has no record of immunization should receive the PCV13 vaccine. An adult aged 52 years or older who has certain medical conditions and has not been previously immunized should receive 1 dose of PCV13 vaccine. This PCV13 should be followed with a dose of pneumococcal polysaccharide (PPSV23) vaccine. The PPSV23 vaccine dose should be obtained at least 8 weeks after the dose of PCV13 vaccine. An adult aged 26  years or older who has certain medical conditions and previously received 1 or more doses of PPSV23 vaccine should receive 1 dose of PCV13. The PCV13 vaccine dose should be obtained 1 or more years after the last PPSV23 vaccine dose.  Pneumococcal polysaccharide (PPSV23) vaccine. When PCV13 is also indicated, PCV13 should be obtained first. All adults aged 60 years and older should be immunized. An adult younger than age 27 years who has certain medical conditions should be immunized. Any person who resides in a nursing home or long-term care facility should be immunized. An adult smoker should be immunized. People with an immunocompromised condition and certain other conditions should receive both PCV13 and PPSV23 vaccines. People with human immunodeficiency virus (HIV) infection should be immunized as soon as possible after diagnosis. Immunization during chemotherapy or radiation therapy should be avoided. Routine use of PPSV23 vaccine is not recommended for American Indians, [place] Natives, or people younger than 65 years unless there are medical conditions that require PPSV23 vaccine. When indicated, people who have unknown immunization and have no record of immunization should receive PPSV23 vaccine. One-time revaccination 5 years after the first dose of PPSV23 is recommended for people aged 19-64 years who have chronic kidney failure, nephrotic syndrome, asplenia, or immunocompromised conditions. People who received 1-2 doses of PPSV23 before age 39 years should receive another dose of PPSV23 vaccine at age 109 years or later if at least 5 years have passed since the previous dose. Doses of PPSV23 are not needed for people immunized with PPSV23 at or after age 47 years.  Meningococcal vaccine. Adults with asplenia or persistent complement component deficiencies should receive 2 doses of quadrivalent meningococcal conjugate (MenACWY-D) vaccine. The doses should be obtained at least 2 months apart.  Microbiologists working with certain meningococcal bacteria, [place] recruits, people at risk during an outbreak, and people who travel to or live in countries with a high rate of meningitis should be immunized. A first-year college student up through age 44 years who is living in a  residence hall should receive a dose if she did not receive a dose on or after her 16th birthday. Adults who have certain high-risk conditions should receive one or more doses of vaccine.  Hepatitis A vaccine. Adults who wish to be protected from this disease, have certain high-risk conditions, work with hepatitis A-infected animals, work in hepatitis A research labs, or travel to or work in countries with a high rate of hepatitis A should be immunized. Adults who were previously unvaccinated and who anticipate close contact with an international adoptee during the first 60 days after arrival in the [place] from a country with a high rate of hepatitis A should be immunized.  Hepatitis B vaccine. Adults who wish to be protected from this disease, have certain high-risk conditions, may be exposed to blood or other infectious body fluids, are household contacts or sex partners of hepatitis B positive people, are clients or workers in certain care facilities, or travel to or work in countries with a high rate of hepatitis B should be immunized.  Haemophilus influenzae type b (Hib) vaccine. A previously unvaccinated person with asplenia or sickle cell disease or having a scheduled splenectomy should receive 1 dose of Hib vaccine. Regardless of previous immunization, a recipient of a hematopoietic stem cell transplant should receive a 3-dose series 6-12 months after her successful transplant. Hib vaccine is not recommended for adults with HIV infection. Preventive Services / Frequency Ages 48 to 48 years  Blood pressure check.** / Every 1 to 2 years.  Lipid and cholesterol check.** / Every 5 years beginning at age  94.  Clinical breast exam.** / Every 3 years for women in their 10s and 63s.  BRCA-related cancer risk assessment.** / For women who have family members with a BRCA-related cancer (breast, ovarian, tubal, or peritoneal cancers).  Pap test.** / Every 2 years from ages 66 through 64. Every 3 years starting at age 51 through age 63 or 22 with a history of 3 consecutive normal Pap tests.  HPV screening.** / Every 3 years from ages 18 through ages 2 to 61 with a history of 3 consecutive normal Pap tests.  Hepatitis C blood test.** / For any individual with known risks for hepatitis C.  Skin self-exam. / Monthly.  Influenza vaccine. / Every year.  Tetanus, diphtheria, and acellular pertussis (Tdap, Td) vaccine.** / Consult your health care provider. Pregnant women should receive 1 dose of Tdap vaccine during each pregnancy. 1 dose of Td every 10 years.  Varicella vaccine.** / Consult your health care provider. Pregnant females who do not have evidence of immunity should receive the first dose after pregnancy.  HPV vaccine. / 3 doses over 6 months, if 17 and younger. The vaccine is not recommended for use in pregnant females. However, pregnancy testing is not needed before receiving a dose.  Measles, mumps, rubella (MMR) vaccine.** / You need at least 1 dose of MMR if you were born in 1957 or later. You may also need a 2nd dose. For females of childbearing age, rubella immunity should be determined. If there is no evidence of immunity, females who are not pregnant should be vaccinated. If there is no evidence of immunity, females who are pregnant should delay immunization until after pregnancy.  Pneumococcal 13-valent conjugate (PCV13) vaccine.** / Consult your health care provider.  Pneumococcal polysaccharide (PPSV23) vaccine.** / 1 to 2 doses if you smoke cigarettes or if you have certain conditions.  Meningococcal vaccine.** / 1 dose if you are age  87 to 21 years and a [place] living in a residence hall, or have one of several medical conditions, you need to get vaccinated against meningococcal disease. You may also need additional booster doses.  Hepatitis A vaccine.** / Consult your health care provider.  Hepatitis B vaccine.** / Consult your health care provider.  Haemophilus influenzae type b (Hib) vaccine.** / Consult your health care provider. Ages 34 to 31 years  Blood pressure check.** / Every 1 to 2 years.  Lipid and cholesterol check.** / Every 5 years beginning at age 48 years.  Lung cancer screening. / Every year if you are aged 4-80 years and have a 30-pack-year history of smoking and currently smoke or have quit within the past 15 years. Yearly screening is stopped once you have quit smoking for at least 15 years or develop a health problem that would prevent you from having lung cancer treatment.  Clinical breast exam.** / Every year after age 31 years.  BRCA-related cancer risk assessment.** / For women who have family members with a BRCA-related cancer (breast, ovarian, tubal, or peritoneal cancers).  Mammogram.** / Every year beginning at age 9 years and continuing for as long as you are in good health. Consult with your health care provider.  Pap test.** / Every 3 years starting at age 43 years through age 35 or 73 years with a history of 3 consecutive normal Pap tests.  HPV screening.** / Every 3 years from ages 76 years through ages 33 to 66 years with a history of 3 consecutive normal Pap tests.  Fecal occult blood test (FOBT) of stool. / Every year beginning at age 47 years and continuing until age 62 years. You may not need to do this test if you get a colonoscopy every 10 years.  Flexible sigmoidoscopy or colonoscopy.** / Every 5 years for a flexible sigmoidoscopy or every 10 years for a colonoscopy beginning at age 28 years and continuing until age 10 years.  Hepatitis C blood test.** / For all people born from [birth year] through  1965 and any individual with known risks for hepatitis C.  Skin self-exam. / Monthly.  Influenza vaccine. / Every year.  Tetanus, diphtheria, and acellular pertussis (Tdap/Td) vaccine.** / Consult your health care provider. Pregnant women should receive 1 dose of Tdap vaccine during each pregnancy. 1 dose of Td every 10 years.  Varicella vaccine.** / Consult your health care provider. Pregnant females who do not have evidence of immunity should receive the first dose after pregnancy.  Zoster vaccine.** / 1 dose for adults aged 80 years or older.  Measles, mumps, rubella (MMR) vaccine.** / You need at least 1 dose of MMR if you were born in 1957 or later. You may also need a 2nd dose. For females of childbearing age, rubella immunity should be determined. If there is no evidence of immunity, females who are not pregnant should be vaccinated. If there is no evidence of immunity, females who are pregnant should delay immunization until after pregnancy.  Pneumococcal 13-valent conjugate (PCV13) vaccine.** / Consult your health care provider.  Pneumococcal polysaccharide (PPSV23) vaccine.** / 1 to 2 doses if you smoke cigarettes or if you have certain conditions.  Meningococcal vaccine.** / Consult your health care provider.  Hepatitis A vaccine.** / Consult your health care provider.  Hepatitis B vaccine.** / Consult your health care provider.  Haemophilus influenzae type b (Hib) vaccine.** / Consult your health care provider. Ages 34 years and over  Blood pressure  check.** / Every 1 to 2 years.  Lipid and cholesterol check.** / Every 5 years beginning at age 52 years.  Lung cancer screening. / Every year if you are aged 45-80 years and have a 30-pack-year history of smoking and currently smoke or have quit within the past 15 years. Yearly screening is stopped once you have quit smoking for at least 15 years or develop a health problem that would prevent you from having lung cancer  treatment.  Clinical breast exam.** / Every year after age 93 years.  BRCA-related cancer risk assessment.** / For women who have family members with a BRCA-related cancer (breast, ovarian, tubal, or peritoneal cancers).  Mammogram.** / Every year beginning at age 28 years and continuing for as long as you are in good health. Consult with your health care provider.  Pap test.** / Every 3 years starting at age 92 years through age 69 or 52 years with 3 consecutive normal Pap tests. Testing can be stopped between 65 and 70 years with 3 consecutive normal Pap tests and no abnormal Pap or HPV tests in the past 10 years.  HPV screening.** / Every 3 years from ages 42 years through ages 3 or 13 years with a history of 3 consecutive normal Pap tests. Testing can be stopped between 65 and 70 years with 3 consecutive normal Pap tests and no abnormal Pap or HPV tests in the past 10 years.  Fecal occult blood test (FOBT) of stool. / Every year beginning at age 67 years and continuing until age 10 years. You may not need to do this test if you get a colonoscopy every 10 years.  Flexible sigmoidoscopy or colonoscopy.** / Every 5 years for a flexible sigmoidoscopy or every 10 years for a colonoscopy beginning at age 64 years and continuing until age 78 years.  Hepatitis C blood test.** / For all people born from [birth year] through 1965 and any individual with known risks for hepatitis C.  Osteoporosis screening.** / A one-time screening for women ages 50 years and over and women at risk for fractures or osteoporosis.  Skin self-exam. / Monthly.  Influenza vaccine. / Every year.  Tetanus, diphtheria, and acellular pertussis (Tdap/Td) vaccine.** / 1 dose of Td every 10 years.  Varicella vaccine.** / Consult your health care provider.  Zoster vaccine.** / 1 dose for adults aged 23 years or older.  Pneumococcal 13-valent conjugate (PCV13) vaccine.** / Consult your health care provider.  Pneumococcal  polysaccharide (PPSV23) vaccine.** / 1 dose for all adults aged 23 years and older.  Meningococcal vaccine.** / Consult your health care provider.  Hepatitis A vaccine.** / Consult your health care provider.  Hepatitis B vaccine.** / Consult your health care provider.  Haemophilus influenzae type b (Hib) vaccine.** / Consult your health care provider. ** Family history and personal history of risk and conditions may change your health care provider's recommendations. Document Released: 05/08/2001 Document Revised: 07/27/2013 Document Reviewed: 08/07/2010 [facility] Patient Information 2015 [place], [place]. This information is not intended to replace advice given to you by your health care provider. Make sure you discuss any questions you have with your health care provider.

## 2014-09-05 ENCOUNTER — Encounter: Payer: Self-pay | Admitting: Family Medicine

## 2014-09-05 DIAGNOSIS — M25559 Pain in unspecified hip: Secondary | ICD-10-CM | POA: Insufficient documentation

## 2014-09-05 DIAGNOSIS — R350 Frequency of micturition: Secondary | ICD-10-CM | POA: Insufficient documentation

## 2014-09-05 DIAGNOSIS — Z0000 Encounter for general adult medical examination without abnormal findings: Secondary | ICD-10-CM | POA: Insufficient documentation

## 2014-09-05 DIAGNOSIS — L578 Other skin changes due to chronic exposure to nonionizing radiation: Secondary | ICD-10-CM

## 2014-09-05 HISTORY — DX: Other skin changes due to chronic exposure to nonionizing radiation: L57.8

## 2014-09-05 NOTE — Assessment & Plan Note (Signed)
Encouraged moist heat and gentle stretching as tolerated. May try NSAIDs and prescription meds as directed and report if symptoms worsen or seek immediate care. May consider referral if worsens

## 2014-09-05 NOTE — Assessment & Plan Note (Signed)
resolved 

## 2014-09-05 NOTE — Assessment & Plan Note (Signed)
UA did not show signs of infection but did have a small amount of blood will have to monitor

## 2014-09-05 NOTE — Assessment & Plan Note (Signed)
Avoid offending foods, take probiotics. Do not eat large meals in late evening and consider raising head of bed.  

## 2014-09-05 NOTE — Progress Notes (Signed)
Carrie Elliott  [id number] [birth year] 09/05/2014      Progress Note-Follow Up  Subjective  Chief Complaint  Chief Complaint  Patient presents with  . Annual Exam    HPI  Patient is a 64 y.o. female in today for routine medical care. Here today for annual exam. Continues to follow with numerous specialists including gastroenterology, Dr. Joya Gaskins of pulmonology and Dr. Tamala Julian of cardiology. No recent changes. Also follows with dermatology. No recent illness. Is struggling with right hip pain, clicking and catching, no recent injury or trauma. No acute illness. Acknowledges a history of previous sun exposure as a youth. Denies CP/palp/SOB/HA/congestion/fevers/GI or GU c/o. Taking meds as prescribed  Past Medical History  Diagnosis Date  . Allergic rhinitis   . History of colonic polyps   . Reflux     cough  . Cystocele     grade 2  . Rectocele     grade 2  . IBS (irritable bowel syndrome)   . Rosacea   . Sternal fracture     1986  . GERD (gastroesophageal reflux disease)   . HTN (hypertension) 04/23/2013  . Chicken pox   . Measles   . Mumps   . Allergy   . Arthritis     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, shoulders, knees  . Endometriosis     with Menometrorhaghia   . Benign paroxysmal positional vertigo 07/12/2013  . Anemia 08/24/2014    Past Surgical History  Procedure Laterality Date  . Appendectomy  1989  . Cholecystectomy  1989  . Atypical nevus excision    . Hemorrhoid surgery  2005  . Left oophorectomy  3790    complicated by ureteral injuery requiring a ureteral stent   . Wisdom teeth removal  1964  . Total abdominal hysterectomy  2000  . Tubal ligation  1981  . Colonoscopy  2011  . Esophagogastroduodenoscopy  2011    with esophageal stent  . Mmk / anterior vesicourethropexy / urethropexy  2000  . Cystoscopy w/ ureteral stent removal  1999  . Tonsillectomy and adenoidectomy      Family History  Problem Relation Age of Onset  . Colon polyps Father   . Hypertension  Father   . Heart failure Father   . Melanoma Father     multiple  . Heart attack Father     x2  . Heart disease Father     Bundle Block  . Meniere's disease Father   . Cancer Father     melanoma  . Osteoporosis Mother   . Transient ischemic attack Mother   . Cancer Mother     colon  . Heart disease Mother     afib  . Hyperlipidemia Mother   . Diabetes Paternal Grandmother   . Stroke Maternal Grandmother   . Cancer Maternal Grandmother     skin, melanoma, sarcoma  . Colon polyps Maternal Grandmother   . Diverticulitis Maternal Grandmother   . Stroke Maternal Grandfather   . Heart disease Maternal Grandfather   . Obesity Brother     History   Social History  . Marital Status: Married    Spouse Name: N/A  . Number of Children: N/A  . Years of Education: N/A   Occupational History  . Not on file.   Social History Main Topics  . Smoking status: Never Smoker   . Smokeless tobacco: Not on file  . Alcohol Use: No  . Drug Use: No  . 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 Not on file   Social History Narrative    Current Outpatient Prescriptions on File Prior to Visit  Medication Sig Dispense Refill  . acetaminophen (TYLENOL) 500 MG tablet Take 1,000 mg by mouth every 6 (six) hours as needed for headache (pain).    . benzocaine (ORAJEL) 10 % mucosal gel Use as directed 1 application in the mouth or throat as needed for mouth pain.    Marland Kitchen esomeprazole (NEXIUM) 40 MG capsule Take 40 mg by mouth 2 (two) times daily.      . fexofenadine (ALLEGRA) 180 MG tablet Take 180 mg by mouth daily.      . Lidocaine-Hydrocortisone Ace 3-0.5 % CREA Apply topically.    . metoprolol succinate (TOPROL-XL) 50 MG 24 hr tablet TAKE 1 TABLET (50 MG TOTAL) BY MOUTH DAILY. 90 tablet 3  . ranitidine (ZANTAC) 150 MG tablet Take 300 mg by mouth daily as needed for heartburn.    Marland Kitchen SIMPLY SALINE NA Place into  the nose as needed.     No current facility-administered medications on file prior to visit.    Allergies  Allergen Reactions  . Cefdinir     HIVES  . Celebrex [Celecoxib]     ABDOMINAL PAIN   . Ciprofloxacin     ABDOMINAL PAIN   . Codeine     NAUSEA VOMITING   . Pseudoephedrine Hcl Er     TACHYCARDIA     Review of Systems  Review of Systems  Constitutional: Negative for fever, chills and malaise/fatigue.  HENT: Negative for congestion, hearing loss and nosebleeds.   Eyes: Negative for discharge.  Respiratory: Negative for cough, sputum production, shortness of breath and wheezing.   Cardiovascular: Negative for chest pain, palpitations and leg swelling.  Gastrointestinal: Negative for heartburn, nausea, vomiting, abdominal pain, diarrhea, constipation and blood in stool.  Genitourinary: Negative for dysuria, urgency, frequency and hematuria.  Musculoskeletal: Positive for joint pain. Negative for myalgias, back pain and falls.       Right hip clicks hurts, catches  Skin: Negative for rash.  Neurological: Negative for dizziness, tremors, sensory change, focal weakness, loss of consciousness, weakness and headaches.  Endo/Heme/Allergies: Negative for polydipsia. Does not bruise/bleed easily.  Psychiatric/Behavioral: Negative for depression and suicidal ideas. The patient is not nervous/anxious and does not have insomnia.     Objective  BP 122/90 mmHg  Pulse 84  Temp(Src) 98.2 F (36.8 C) (Oral)  Ht 5\' 1"  (1.549 m)  Wt 167 lb 8 oz (75.978 kg)  BMI 31.67 kg/m2  SpO2 97%  Physical Exam  Physical Exam  Constitutional: She is oriented to person, place, and time and well-developed, well-nourished, and in no distress. No distress.  HENT:  Head: Normocephalic and atraumatic.  Right Ear: External ear normal.  Left Ear: External ear normal.  Nose: Nose normal.  Mouth/Throat: Oropharynx is clear and moist. No oropharyngeal exudate.  Eyes: Conjunctivae are normal. Pupils  are equal, round, and reactive to light. Right eye exhibits no discharge. Left eye exhibits no discharge. No scleral icterus.  Neck: Normal range of motion. Neck supple. No thyromegaly present.  Cardiovascular: Normal rate, regular rhythm, normal heart sounds and intact distal pulses.   No murmur heard. Pulmonary/Chest: Effort normal and breath sounds normal. No respiratory distress. She has no wheezes. She has no rales.  Abdominal: Soft. Bowel sounds are normal. She exhibits no distension and no mass. There is no  tenderness.  Genitourinary: Vagina normal, uterus normal, cervix normal, right adnexa normal and left adnexa normal. No vaginal discharge found.  Musculoskeletal: Normal range of motion. She exhibits no edema or tenderness.  Lymphadenopathy:    She has no cervical adenopathy.  Neurological: She is alert and oriented to person, place, and time. She has normal reflexes. No cranial nerve deficit. Coordination normal.  Skin: Skin is warm and dry. No rash noted. She is not diaphoretic.  Scaly, waxy brown lesion right temple  Psychiatric: Mood, memory and affect normal.    Lab Results  Component Value Date   TSH 0.78 08/24/2014   Lab Results  Component Value Date   WBC 5.9 08/24/2014   HGB 12.3 08/24/2014   HCT 37.4 08/24/2014   MCV 80.7 08/24/2014   PLT 273.0 08/24/2014   Lab Results  Component Value Date   CREATININE 0.82 08/24/2014   BUN 16 08/24/2014   NA 139 08/24/2014   K 4.2 08/24/2014   CL 105 08/24/2014   CO2 25 08/24/2014   Lab Results  Component Value Date   ALT 21 08/24/2014   AST 20 08/24/2014   ALKPHOS 64 08/24/2014   BILITOT 0.3 08/24/2014   Lab Results  Component Value Date   CHOL 140 04/24/2013   Lab Results  Component Value Date   HDL 47 04/24/2013   Lab Results  Component Value Date   LDLCALC 70 04/24/2013   Lab Results  Component Value Date   TRIG 113 04/24/2013   Lab Results  Component Value Date   CHOLHDL 3.0 04/24/2013      Assessment & Plan  HTN (hypertension) Well controlled, no changes to meds. Encouraged heart healthy diet such as the DASH diet and exercise as tolerated.   Cervical cancer screening Pap today, no concerns on exam.  Menarche at 12 Regular and moderate to heavy flow  history of abnormal pap in past, 1 or 2 abnormals many years ago normalized on recheck H/o ovarian cysts, tubal ligation, oophorectomy then TAH for heavy bleeding from Endometfriosis G2P2, s/p 2 svd history of abnormal MGMs with one FN biopsy which was benign and resolved in left breast, next is scheduled for 09/10/14 No concerns 6-7 years Vivelle dot, weaned off years ago  IBS (irritable bowel syndrome) Avoid offending foods, take probiotics. Do not eat large meals in late evening and consider raising head of bed.   Urinary frequency UA did not show signs of infection but did have a small amount of blood will have to monitor  Anemia resolved  Preventative health care Patient encouraged to maintain heart healthy diet, regular exercise, adequate sleep. Consider daily probiotics. Take medications as prescribed. Pap today, no concerns on exam. Given and reviewed copy of ACP documents from [facility] and encouraged to complete and return. Labs reviewed  Hip pain Encouraged moist heat and gentle stretching as tolerated. May try NSAIDs and prescription meds as directed and report if symptoms worsen or seek immediate care. May consider referral if worsens  Sun-damaged skin Patient is encouraged to see dermatology, she agrees to call if she wants a referral

## 2014-09-05 NOTE — Assessment & Plan Note (Signed)
Patient is encouraged to see dermatology, she agrees to call if she wants a referral

## 2014-09-05 NOTE — Assessment & Plan Note (Signed)
Patient encouraged to maintain heart healthy diet, regular exercise, adequate sleep. Consider daily probiotics. Take medications as prescribed. Pap today, no concerns on exam. Given and reviewed copy of ACP documents from [facility] and encouraged to complete and return. Labs reviewed

## 2014-09-10 ENCOUNTER — Ambulatory Visit (HOSPITAL_COMMUNITY)
Admission: RE | Admit: 2014-09-10 | Discharge: 2014-09-10 | Disposition: A | Discharge status: Home / Self Care | Payer: 59 | Source: Ambulatory Visit | Attending: Family Medicine | Admitting: Family Medicine

## 2014-09-10 DIAGNOSIS — Z1231 Encounter for screening mammogram for malignant neoplasm of breast: Secondary | ICD-10-CM | POA: Diagnosis present

## 2015-04-08 ENCOUNTER — Telehealth: Payer: Self-pay | Admitting: Family Medicine

## 2015-04-08 NOTE — Telephone Encounter (Signed)
Relation to [id number] Call back number:[phone]   Reason for call:  atient physical appointment for 08/30/15 7:15a had to be [facility] due to provider not being in the office and next available is 10/09/15 patient stated she would like a physical before her 65th birthday which is February 02, 1951 and requesting any Friday early morning in the month of June. Please advise

## 2015-04-10 NOTE — Telephone Encounter (Signed)
Let's do June 2, can do a 7:15 appt. THX

## 2015-04-11 NOTE — Telephone Encounter (Signed)
lvm advising patient of message below °

## 2015-04-21 ENCOUNTER — Other Ambulatory Visit: Payer: Self-pay

## 2015-04-21 MED ORDER — ONDANSETRON HCL 8 MG PO TABS: 8.0000 mg | ORAL_TABLET | Freq: Three times a day (TID) | ORAL | Status: DC | PRN

## 2015-04-21 MED FILL — ONDANSETRON HCL 8 MG TABLET: 8 | 30 days supply | Qty: 90 | Fill #0

## 2015-05-02 DIAGNOSIS — K219 Gastro-esophageal reflux disease without esophagitis: Secondary | ICD-10-CM | POA: Diagnosis not present

## 2015-05-02 DIAGNOSIS — Z86010 Personal history of colonic polyps: Secondary | ICD-10-CM | POA: Diagnosis not present

## 2015-05-03 MED FILL — MOMETASONE FUROATE 50 MCG S: 50 | 30 days supply | Qty: 17 | Fill #1

## 2015-05-11 MED FILL — GAVILYTE-N SOLUTION: 420 | 1 days supply | Qty: 4000 | Fill #0

## 2015-05-18 ENCOUNTER — Encounter (HOSPITAL_COMMUNITY): Payer: Self-pay | Admitting: *Deleted

## 2015-05-18 ENCOUNTER — Telehealth: Payer: Self-pay

## 2015-05-18 MED ORDER — AZITHROMYCIN 250 MG PO TABS: ORAL_TABLET | ORAL | Status: DC

## 2015-05-18 MED FILL — AZITHROMYCIN 250 MG TABLET: 250 | 5 days supply | Qty: 6 | Fill #0

## 2015-05-18 NOTE — Telephone Encounter (Signed)
PER DR Shaune Leeks, CALL IN GENERIC [facility] FOR PATIENT.

## 2015-05-19 MED FILL — METOPROLOL SUCC ER 50 MG TA: 50 | 90 days supply | Qty: 90 | Fill #3

## 2015-05-24 ENCOUNTER — Other Ambulatory Visit: Payer: Self-pay | Admitting: Gastroenterology

## 2015-05-27 ENCOUNTER — Ambulatory Visit (HOSPITAL_COMMUNITY): Payer: 59 | Admitting: Certified Registered Nurse Anesthetist

## 2015-05-27 ENCOUNTER — Encounter: Payer: Self-pay | Admitting: Family Medicine

## 2015-05-27 ENCOUNTER — Encounter (HOSPITAL_COMMUNITY): Payer: Self-pay | Admitting: *Deleted

## 2015-05-27 ENCOUNTER — Ambulatory Visit (HOSPITAL_COMMUNITY)
Admission: RE | Admit: 2015-05-27 | Discharge: 2015-05-27 | Disposition: A | Discharge status: Home / Self Care | Payer: 59 | Source: Ambulatory Visit | Attending: Gastroenterology | Admitting: Gastroenterology

## 2015-05-27 ENCOUNTER — Encounter (HOSPITAL_COMMUNITY)
Admission: RE | Disposition: A | Discharge status: Home / Self Care | Payer: Self-pay | Source: Ambulatory Visit | Attending: Gastroenterology

## 2015-05-27 DIAGNOSIS — K219 Gastro-esophageal reflux disease without esophagitis: Secondary | ICD-10-CM | POA: Insufficient documentation

## 2015-05-27 DIAGNOSIS — Z9049 Acquired absence of other specified parts of digestive tract: Secondary | ICD-10-CM | POA: Insufficient documentation

## 2015-05-27 DIAGNOSIS — D120 Benign neoplasm of cecum: Secondary | ICD-10-CM | POA: Diagnosis not present

## 2015-05-27 DIAGNOSIS — J309 Allergic rhinitis, unspecified: Secondary | ICD-10-CM | POA: Diagnosis not present

## 2015-05-27 DIAGNOSIS — Z79899 Other long term (current) drug therapy: Secondary | ICD-10-CM | POA: Diagnosis not present

## 2015-05-27 DIAGNOSIS — Z8371 Family history of colonic polyps: Secondary | ICD-10-CM | POA: Diagnosis not present

## 2015-05-27 DIAGNOSIS — K648 Other hemorrhoids: Secondary | ICD-10-CM | POA: Diagnosis not present

## 2015-05-27 DIAGNOSIS — K2950 Unspecified chronic gastritis without bleeding: Secondary | ICD-10-CM | POA: Diagnosis not present

## 2015-05-27 DIAGNOSIS — D125 Benign neoplasm of sigmoid colon: Secondary | ICD-10-CM | POA: Diagnosis not present

## 2015-05-27 DIAGNOSIS — K635 Polyp of colon: Secondary | ICD-10-CM | POA: Diagnosis not present

## 2015-05-27 DIAGNOSIS — D127 Benign neoplasm of rectosigmoid junction: Secondary | ICD-10-CM | POA: Diagnosis not present

## 2015-05-27 DIAGNOSIS — K644 Residual hemorrhoidal skin tags: Secondary | ICD-10-CM | POA: Insufficient documentation

## 2015-05-27 DIAGNOSIS — Z1211 Encounter for screening for malignant neoplasm of colon: Secondary | ICD-10-CM | POA: Diagnosis not present

## 2015-05-27 DIAGNOSIS — K317 Polyp of stomach and duodenum: Secondary | ICD-10-CM | POA: Diagnosis not present

## 2015-05-27 DIAGNOSIS — Z86010 Personal history of colonic polyps: Secondary | ICD-10-CM | POA: Diagnosis not present

## 2015-05-27 DIAGNOSIS — Z800 Family history of malignant neoplasm of digestive organs: Secondary | ICD-10-CM | POA: Diagnosis not present

## 2015-05-27 DIAGNOSIS — K449 Diaphragmatic hernia without obstruction or gangrene: Secondary | ICD-10-CM | POA: Insufficient documentation

## 2015-05-27 DIAGNOSIS — I10 Essential (primary) hypertension: Secondary | ICD-10-CM | POA: Diagnosis not present

## 2015-05-27 DIAGNOSIS — D123 Benign neoplasm of transverse colon: Secondary | ICD-10-CM | POA: Diagnosis not present

## 2015-05-27 HISTORY — PX: ESOPHAGOGASTRODUODENOSCOPY (EGD) WITH PROPOFOL: SHX5813

## 2015-05-27 HISTORY — PX: COLONOSCOPY WITH PROPOFOL: SHX5780

## 2015-05-27 HISTORY — DX: Unspecified hemorrhoids: K64.9

## 2015-05-27 SURGERY — ESOPHAGOGASTRODUODENOSCOPY (EGD) WITH PROPOFOL
Anesthesia: Monitor Anesthesia Care

## 2015-05-27 MED ORDER — PROPOFOL 10 MG/ML IV BOLUS
INTRAVENOUS | Status: DC | PRN
  Administered 2015-05-27: 30 mg via INTRAVENOUS
  Administered 2015-05-27 (×2): 50 mg via INTRAVENOUS
  Administered 2015-05-27: 30 mg via INTRAVENOUS
  Administered 2015-05-27 (×2): 50 mg via INTRAVENOUS
  Administered 2015-05-27: 30 mg via INTRAVENOUS
  Administered 2015-05-27 (×3): 50 mg via INTRAVENOUS
  Administered 2015-05-27 (×5): 30 mg via INTRAVENOUS
  Administered 2015-05-27: 50 mg via INTRAVENOUS
  Administered 2015-05-27 (×2): 30 mg via INTRAVENOUS

## 2015-05-27 MED ORDER — PROPOFOL 10 MG/ML IV BOLUS
INTRAVENOUS | Status: AC
  Filled 2015-05-27: qty 20

## 2015-05-27 MED ORDER — PROPOFOL 10 MG/ML IV BOLUS
INTRAVENOUS | Status: AC
  Filled 2015-05-27: qty 40

## 2015-05-27 MED ORDER — LACTATED RINGERS IV SOLN
INTRAVENOUS | Status: DC
  Administered 2015-05-27: 1000 mL via INTRAVENOUS

## 2015-05-27 MED ORDER — SODIUM CHLORIDE 0.9 % IV SOLN
INTRAVENOUS | Status: DC
  Administered 2015-05-27: 11:00:00 via INTRAVENOUS

## 2015-05-27 SURGICAL SUPPLY — 24 items

## 2015-05-27 NOTE — Anesthesia Preprocedure Evaluation (Addendum)
Anesthesia Evaluation  Patient identified by MRN, date of birth, ID band Patient awake    Reviewed: Allergy & Precautions, NPO status , Patient's Chart, lab work & pertinent test results, reviewed documented beta blocker date and time   Airway Mallampati: II  TM Distance: >3 FB Neck ROM: Full    Dental  (+) Teeth Intact, Dental Advisory Given   Pulmonary neg pulmonary ROS,    Pulmonary exam normal breath sounds clear to auscultation       Cardiovascular hypertension, Pt. on medications and Pt. on home beta blockers (-) angina(-) CAD and (-) Past MI Normal cardiovascular exam Rhythm:Regular Rate:Normal  Echo 04/24/13: Study Conclusions  Left ventricle: The cavity size was normal. Wall thickness was increased in a pattern of mild LVH. Systolic function was mildly reduced. The estimated ejection fraction was in the range of 45% to 50%. Wall motion was normal; there were no regional wall motion abnormalities. Left ventricular diastolic function parameters were normal.    Neuro/Psych  Headaches,  Neuromuscular disease negative psych ROS   GI/Hepatic Neg liver ROS, GERD  Medicated,IBS prior history of esophageal rupture after endoscopy   Endo/Other  negative endocrine ROS  Renal/GU negative Renal ROS   Endometriosis    Musculoskeletal  (+) Arthritis , Osteoarthritis,    Abdominal   Peds  Hematology  (+) Blood dyscrasia, anemia ,   Anesthesia Other Findings Day of surgery medications reviewed with the patient.  Reproductive/Obstetrics negative OB ROS                          Anesthesia Physical Anesthesia Plan  ASA: II  Anesthesia Plan: MAC   Post-op Pain Management:    Induction: Intravenous  Airway Management Planned: Nasal Cannula  Additional Equipment:   Intra-op Plan:   Post-operative Plan:   Informed Consent: I have reviewed the patients History and Physical, chart, labs and  discussed the procedure including the risks, benefits and alternatives for the proposed anesthesia with the patient or authorized representative who has indicated his/her understanding and acceptance.   Dental advisory given  Plan Discussed with: CRNA and Anesthesiologist  Anesthesia Plan Comments: (Discussed risks/benefits/alternatives to MAC sedation including need for ventilatory support, hypotension, need for conversion to general anesthesia.  All patient questions answered.  Patient wished to proceed.)        Anesthesia Quick Evaluation

## 2015-05-27 NOTE — Discharge Instructions (Addendum)
Moderate Conscious Sedation, Adult, Care After Refer to this sheet in the next few weeks. These instructions provide you with information on caring for yourself after your procedure. Your health care provider may also give you more specific instructions. Your treatment has been planned according to current medical practices, but problems sometimes occur. Call your health care provider if you have any problems or questions after your procedure. WHAT TO EXPECT AFTER THE PROCEDURE  After your procedure:  You may feel sleepy, clumsy, and have poor balance for several hours.  Vomiting may occur if you eat too soon after the procedure. HOME CARE INSTRUCTIONS  Do not participate in any activities where you could become injured for at least 24 hours. Do not:  Drive.  Swim.  Ride a bicycle.  Operate heavy machinery.  Cook.  Use power tools.  Climb ladders.  Work from a high place.  Do not make important decisions or sign legal documents until you are improved.  If you vomit, drink water, juice, or soup when you can drink without vomiting. Make sure you have little or no nausea before eating solid foods.  Only take over-the-counter or prescription medicines for pain, discomfort, or fever as directed by your health care provider.  Make sure you and your family fully understand everything about the medicines given to you, including what side effects may occur.  You should not drink alcohol, take sleeping pills, or take medicines that cause drowsiness for at least 24 hours.  If you smoke, do not smoke without supervision.  If you are feeling better, you may resume normal activities 24 hours after you were sedated.  Keep all appointments with your health care provider. SEEK MEDICAL CARE IF:  Your skin is pale or bluish in color.  You continue to feel nauseous or vomit.  Your pain is getting worse and is not helped by medicine.  You have bleeding or swelling.  You are still  sleepy or feeling clumsy after 24 hours. SEEK IMMEDIATE MEDICAL CARE IF:  You develop a rash.  You have difficulty breathing.  You develop any type of allergic problem.  You have a fever. MAKE SURE YOU:  Understand these instructions.  Will watch your condition.  Will get help right away if you are not doing well or get worse.   This information is not intended to replace advice given to you by your health care provider. Make sure you discuss any questions you have with your health care provider.   Esophagogastroduodenoscopy, Care After Refer to this sheet in the next few weeks. These instructions provide you with information about caring for yourself after your procedure. Your health care provider may also give you more specific instructions. Your treatment has been planned according to current medical practices, but problems sometimes occur. Call your health care provider if you have any problems or questions after your procedure. WHAT TO EXPECT AFTER THE PROCEDURE After your procedure, it is typical to feel:  Soreness in your throat.  Pain with swallowing.  Sick to your stomach (nauseous).  Bloated.  Dizzy.  Fatigued. HOME CARE INSTRUCTIONS  Do not eat or drink anything until the numbing medicine (local anesthetic) has worn off and your gag reflex has returned. You will know that the local anesthetic has worn off when you can swallow comfortably.  Do not drive or operate machinery until directed by your health care provider.  Take medicines only as directed by your health care provider. SEEK MEDICAL CARE IF:   You  cannot stop coughing.  You are not urinating at all or less than usual. SEEK IMMEDIATE MEDICAL CARE IF:  You have difficulty swallowing.  You cannot eat or drink.  You have worsening throat or chest pain.  You have dizziness or lightheadedness or you faint.  You have nausea or vomiting.  You have chills.  You have a fever.  You have severe  abdominal pain.  You have black, tarry, or bloody stools.   This information is not intended to replace advice given to you by your health care provider. Make sure you discuss any questions you have with your health care provider.   Colonoscopy, Care After Refer to this sheet in the next few weeks. These instructions provide you with information on caring for yourself after your procedure. Your health care provider may also give you more specific instructions. Your treatment has been planned according to current medical practices, but problems sometimes occur. Call your health care provider if you have any problems or questions after your procedure. WHAT TO EXPECT AFTER THE PROCEDURE  After your procedure, it is typical to have the following: A small amount of blood in your stool. Moderate amounts of gas and mild abdominal cramping or bloating. HOME CARE INSTRUCTIONS Do not drive, operate machinery, or sign important documents for 24 hours. You may shower and resume your regular physical activities, but move at a slower pace for the first 24 hours. Take frequent rest periods for the first 24 hours. Walk around or put a warm pack on your abdomen to help reduce abdominal cramping and bloating. Drink enough fluids to keep your urine clear or pale yellow. You may resume your normal diet as instructed by your health care provider. Avoid heavy or fried foods that are hard to digest. Avoid drinking alcohol for 24 hours or as instructed by your health care provider. Only take over-the-counter or prescription medicines as directed by your health care provider. If a tissue sample (biopsy) was taken during your procedure: Do not take aspirin or blood thinners for 7 days, or as instructed by your health care provider. Do not drink alcohol for 7 days, or as instructed by your health care provider. Eat soft foods for the first 24 hours. SEEK MEDICAL CARE IF: You have persistent spotting of blood in your  stool 2-3 days after the procedure. SEEK IMMEDIATE MEDICAL CARE IF: You have more than a small spotting of blood in your stool. You pass large blood clots in your stool. Your abdomen is swollen (distended). You have nausea or vomiting. You have a fever. You have increasing abdominal pain that is not relieved with medicine.   This information is not intended to replace advice given to you by your health care provider. Make sure you discuss any questions you have with your health care provider.   Document Released: 10/25/2003 Document Revised: 12/31/2012 Document Reviewed: 11/17/2012 Elsevier Interactive Patient Education [employer].   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

## 2015-05-27 NOTE — Transfer of Care (Signed)
Immediate Anesthesia Transfer of Care Note  Patient: Carrie Elliott  Procedure(s) Performed: Procedure(s): ESOPHAGOGASTRODUODENOSCOPY (EGD) WITH PROPOFOL (N/A) COLONOSCOPY WITH PROPOFOL (N/A)  Patient Location: PACU  Anesthesia Type:MAC  Level of Consciousness: awake, alert  and oriented  Airway & Oxygen Therapy: Patient Spontanous Breathing and Patient connected to nasal cannula oxygen  Post-op Assessment: Report given to RN and Post -op Vital signs reviewed and stable  Post vital signs: Reviewed and stable  Last Vitals:  Filed Vitals:   05/27/15 0911  BP: 141/91  Temp: 36.7 C  Resp: 14    Complications: No apparent anesthesia complications

## 2015-05-27 NOTE — Progress Notes (Signed)
Carrie Elliott 10:06 AM  Subjective: Patient has been doing fairly well since we last saw her in the office still with significant reflux and some the right hip or lower quadrant discomfort possibly just from exercise and she did well with her prep but did require a little Zofran and has no other complaints  Objective: Vital signs stable afebrile no acute distress exam please see preassessment evaluation  Assessment: History of colon polyps and increased reflux  Plan: Okay to proceed with colonoscopy and endoscopy with anesthesia assistance  [facility]  Pager [phone] After 5PM or if no answer call [phone]

## 2015-05-27 NOTE — Op Note (Signed)
[facility] [place], [id number]   COLONOSCOPY PROCEDURE REPORT     EXAM DATE: June 22, 2015  PATIENT NAME:      Carrie Elliott, Carrie Elliott           MR #:      [id number]  BIRTHDATE:       07/22/1950      VISIT #:     [phone]  ATTENDING:     Clarene Essex, MD     STATUS:     outpatient ASSISTANT:      Monday, Judson Roch and Jiles Harold  INDICATIONS:  The patient is a 65 yr old female here for a colonoscopy due to high risk patient with personal history of colonic polyps. PROCEDURE PERFORMED:     Colonoscopy with biopsy Colonoscopy with snare polypectomy MEDICATIONS:     Propofol 500 mg IV ESTIMATED BLOOD LOSS:     None  CONSENT: The patient understands the risks and benefits of the procedure and understands that these risks include, but are not limited to: sedation, allergic reaction, infection, perforation and/or bleeding. Alternative means of evaluation and treatment include, among others: physical exam, x-rays, and/or surgical intervention. The patient elects to proceed with this endoscopic procedure.  DESCRIPTION OF PROCEDURE: During intra-op preparation period all mechanical & medical equipment was checked for proper function. Hand hygiene and appropriate measures for infection prevention was taken. After the risks, benefits and alternatives of the procedure were thoroughly explained, Informed consent was verified, confirmed and timeout was successfully executed by the treatment team. A digital exam revealed internal hemorrhoids and revealed external hemorrhoids. The Pentax Ped Colon [id number] endoscope was introduced through the anus and advanced to the terminal ileum which was intubated for a short distance.The prep was  adequate The instrument was then slowly withdrawn as the colon was fully examined.Estimated blood loss is zero unless otherwise noted in this procedure report. the findings are recorded below      Retroflexed views  revealed internal hemorrhoids. The scope was then completely withdrawn from the patient and the procedure terminated. SCOPE WITHDRAWAL TIME: ee nurse's note    ADVERSE EVENTS:      There were no immediate complications.  IMPRESSIONS:     1. Small internal and external hemorrhoids 2. Scattered left-sided diverticula small 3. 2 tiny sigmoid and rectal polyps cold biopsy 3. Small transverse polyp status post hot snare with cautery set on 2020 4. One small one medium cecal polyp hot snared with cautery at 1515    5. Otherwise within normal limits to the terminal ileum  RECOMMENDATIONS:     await pathology possibly repeat colonoscopy in 3 years and proceed with a endoscopy RECALL:     as above or as needed  _____________________________ Clarene Essex, MD eSigned:  Clarene Essex, MD 06-22-15 11:10 AM   cc:   CPT CODES: ICD CODES:  The ICD and CPT codes recommended by this software are interpretations from the data that the clinical staff has captured with the software.  The verification of the translation of this report to the ICD and CPT codes and modifiers is the sole responsibility of the health care institution and practicing physician where this report was generated.  [place]. will not be held responsible for the validity of the ICD and CPT codes included on this report.  AMA assumes no liability for data contained or not contained herein. CPT is a Designer, television/film set of the [employer].   PATIENT NAME:  Allisson, Zimny MR#: [id number]

## 2015-05-27 NOTE — Op Note (Signed)
[place], [address]   ENDOSCOPY PROCEDURE REPORT  PATIENT: Carrie, Elliott  MR#: [id number] BIRTHDATE: 1950/08/20 , 25  yrs. old GENDER: female ENDOSCOPIST: Clarene Essex, MD REFERRED BY: PROCEDURE DATE:  2015-05-29 PROCEDURE:  EGD w/ biopsy ASA CLASS:     Class II INDICATIONS:  heartburn and therapy of esophageal reflux. MEDICATIONS: Propofol 200 mg IV TOPICAL ANESTHETIC: none  DESCRIPTION OF PROCEDURE: After the risks benefits and alternatives of the procedure were thoroughly explained, informed consent was obtained.  The Pentax [id number] 3.8 [id number] endoscope was introduced through the mouth and advanced to the second portion of the duodenum , Without limitations.  The instrument was slowly withdrawn as the mucosa was fully examined. Estimated blood loss is zero unless otherwise noted in this procedure report.    The findings are recorded below       Retroflexed views revealed a hiatal hernia.     The scope was then withdrawn from the patient and the procedure completed.  COMPLICATIONS: There were no immediate complications.  ENDOSCOPIC IMPRESSION: 1.Moderately large hiatal hernia 2. Multiple gastric polyps a few biopsy including hiatal hernia pouch and two distal ones with red tips    and a few of the larger ones    3. Otherwise within normal limits EGDto the second portion of the duodenum  RECOMMENDATIONS: await pathology and discuss moderately large hiatal hernia with patient and family  REPEAT EXAM: as needed or pending pathology  eSigned:  Clarene Essex, MD 05-29-15 11:17 AM    CC:  CPT CODES: ICD CODES:  The ICD and CPT codes recommended by this software are interpretations from the data that the clinical staff has captured with the software.  The verification of the translation of this report to the ICD and CPT codes and modifiers is the sole responsibility of the health care institution and  practicing physician where this report was generated.  [place]. will not be held responsible for the validity of the ICD and CPT codes included on this report.  AMA assumes no liability for data contained or not contained herein. CPT is a Designer, television/film set of the [employer].  PATIENT NAME:  Carrie, Elliott MR#: [id number]

## 2015-05-27 NOTE — Anesthesia Postprocedure Evaluation (Signed)
Anesthesia Post Note  Patient: Carrie Elliott  Procedure(s) Performed: Procedure(s) (LRB): ESOPHAGOGASTRODUODENOSCOPY (EGD) WITH PROPOFOL (N/A) COLONOSCOPY WITH PROPOFOL (N/A)  Patient location during evaluation: Endoscopy Anesthesia Type: MAC Level of consciousness: awake and alert Pain management: pain level controlled Vital Signs Assessment: post-procedure vital signs reviewed and stable Respiratory status: spontaneous breathing, nonlabored ventilation, respiratory function stable and patient connected to nasal cannula oxygen Cardiovascular status: stable and blood pressure returned to baseline Anesthetic complications: no    Last Vitals:  Filed Vitals:   05/27/15 1130 05/27/15 1140  BP: 124/64 120/72  Pulse: 65 68  Temp:    Resp: 15 17    Last Pain: There were no vitals filed for this visit.               Catalina Gravel

## 2015-05-30 ENCOUNTER — Encounter (HOSPITAL_COMMUNITY): Payer: Self-pay | Admitting: Gastroenterology

## 2015-06-10 ENCOUNTER — Encounter: Payer: Self-pay | Admitting: Family Medicine

## 2015-06-10 ENCOUNTER — Telehealth: Payer: Self-pay | Admitting: Family Medicine

## 2015-06-10 NOTE — Telephone Encounter (Signed)
Pt called regarding skin issue she has. She said Dr. Charlett Blake told her to call whenever she was ready to go to Dermatology. Pt last visit 08/2014. She states it's the same issue. Where would Dr. Charlett Blake recommend and how quickly can she get in. Pt requested a reply via [employer].

## 2015-06-12 ENCOUNTER — Other Ambulatory Visit: Payer: Self-pay | Admitting: Family Medicine

## 2015-06-12 DIAGNOSIS — L578 Other skin changes due to chronic exposure to nonionizing radiation: Secondary | ICD-10-CM

## 2015-06-12 NOTE — Telephone Encounter (Signed)
I have placed referral for dermatology, please let her know via Mychart she will be hearing soon

## 2015-06-13 ENCOUNTER — Other Ambulatory Visit: Payer: Self-pay | Admitting: Family Medicine

## 2015-06-13 ENCOUNTER — Encounter: Payer: Self-pay | Admitting: Family Medicine

## 2015-06-13 DIAGNOSIS — L578 Other skin changes due to chronic exposure to nonionizing radiation: Secondary | ICD-10-CM

## 2015-06-13 NOTE — Telephone Encounter (Signed)
Patient informed. 

## 2015-06-14 MED FILL — MOMETASONE FUROATE 50 MCG S: 50 | 30 days supply | Qty: 17 | Fill #2

## 2015-06-14 MED FILL — raNITIdine HCL 150 MG TABS: 150 | 30 days supply | Qty: 120 | Fill #0

## 2015-06-15 ENCOUNTER — Telehealth: Payer: Self-pay

## 2015-06-15 ENCOUNTER — Other Ambulatory Visit: Payer: Self-pay

## 2015-06-15 MED ORDER — AZITHROMYCIN 250 MG PO TABS: ORAL_TABLET | ORAL | Status: DC

## 2015-06-15 MED FILL — AZITHROMYCIN 250 MG TABLET: 250 | 5 days supply | Qty: 6 | Fill #0

## 2015-06-15 NOTE — Telephone Encounter (Signed)
Ok to send a z-pak per Dr.Bhatti.

## 2015-06-23 DIAGNOSIS — L821 Other seborrheic keratosis: Secondary | ICD-10-CM | POA: Diagnosis not present

## 2015-06-23 DIAGNOSIS — D1800 Hemangioma unspecified site: Secondary | ICD-10-CM | POA: Diagnosis not present

## 2015-06-23 DIAGNOSIS — D485 Neoplasm of uncertain behavior of skin: Secondary | ICD-10-CM | POA: Diagnosis not present

## 2015-06-23 DIAGNOSIS — R202 Paresthesia of skin: Secondary | ICD-10-CM | POA: Diagnosis not present

## 2015-06-23 DIAGNOSIS — D2262 Melanocytic nevi of left upper limb, including shoulder: Secondary | ICD-10-CM | POA: Diagnosis not present

## 2015-06-23 DIAGNOSIS — L719 Rosacea, unspecified: Secondary | ICD-10-CM | POA: Diagnosis not present

## 2015-07-13 DIAGNOSIS — J381 Polyp of vocal cord and larynx: Secondary | ICD-10-CM | POA: Diagnosis not present

## 2015-07-13 DIAGNOSIS — D225 Melanocytic nevi of trunk: Secondary | ICD-10-CM | POA: Diagnosis not present

## 2015-07-13 DIAGNOSIS — D485 Neoplasm of uncertain behavior of skin: Secondary | ICD-10-CM | POA: Diagnosis not present

## 2015-07-13 DIAGNOSIS — D1801 Hemangioma of skin and subcutaneous tissue: Secondary | ICD-10-CM | POA: Diagnosis not present

## 2015-07-13 DIAGNOSIS — L814 Other melanin hyperpigmentation: Secondary | ICD-10-CM | POA: Diagnosis not present

## 2015-07-15 DIAGNOSIS — K219 Gastro-esophageal reflux disease without esophagitis: Secondary | ICD-10-CM | POA: Diagnosis not present

## 2015-07-15 DIAGNOSIS — Z86010 Personal history of colonic polyps: Secondary | ICD-10-CM | POA: Diagnosis not present

## 2015-07-15 DIAGNOSIS — R194 Change in bowel habit: Secondary | ICD-10-CM | POA: Diagnosis not present

## 2015-07-20 MED FILL — MOMETASONE FUROATE 50 MCG S: 50 | 90 days supply | Qty: 51 | Fill #3

## 2015-07-22 ENCOUNTER — Encounter: Payer: Self-pay | Admitting: Dietician

## 2015-07-22 ENCOUNTER — Encounter: Payer: 59 | Attending: Family Medicine | Admitting: Dietician

## 2015-07-22 VITALS — Ht 61.0 in | Wt 160.0 lb

## 2015-07-22 DIAGNOSIS — Z713 Dietary counseling and surveillance: Secondary | ICD-10-CM | POA: Diagnosis not present

## 2015-07-22 DIAGNOSIS — K219 Gastro-esophageal reflux disease without esophagitis: Secondary | ICD-10-CM

## 2015-07-22 NOTE — Patient Instructions (Signed)
Continue a low fat diet.  Slow, purposeful weight loss is good. Consider small frequent meals.  Avoid overeating. Avoid any trigger foods including black pepper and caffeine, even decaf tea.  See the GERD handout. Consider avoiding onions, garlic, and processed meats with nitrites.   Consider a chewable vitamin regularly.  The [place] pharmacies have those that are more easily absorbed.   Recommend getting your vitamin B-12 and Vitamin D levels checked.

## 2015-07-22 NOTE — Progress Notes (Signed)
  Medical Nutrition Therapy:  Appt start time: 0800 end time:  0915.   Assessment:  Primary concerns today: She is here with her husband.  She would like to learn how to eat to best get her nutrition and decrease symptoms. Her hx includes HTN and severe GERD.  She reports recent endoscopy and colonoscopy with polyps on her larynx, hiatal hernia, multiple in her stomach, cecum, colon and rectum.  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  02/03/10 she had a perforated esophagus with an endoscopy resulting in a lengthy hospital stay and no oral nutrition.  She received TNA in the hospital and at home.  She reports long term hx of GERD resulting in significant damage.  She has had long term use of Nexium.  She relates loose stools most often.  For the most part she has avoided many foods that cause her problems.  She has lost 10 lbs purposefully since the beginning of March.  Current weight of 160 lbs.    She lives with her husband.  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  Preferred Learning Style:   No preference indicated   Learning Readiness:   Ready  Change in progress  MEDICATIONS: see list to include nexium and zantac.  She also takes a gummy vitamin but infrequently.   DIETARY INTAKE:  Usual eating pattern includes 3 meals and 1-3 snacks per day. Avoided foods include coffee, spices.    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   Nutritional Diagnosis:  NB-1.1 Food and nutrition-related knowledge deficit As related to GERD .  As evidenced by patient report.    Intervention:  Nutrition counseling/educcation related to GERD and recommendations to improve symptoms.  Discussed gut/brain connection plus physical triggers.  Recommended not lying down for 3 hours after eating.  Consider raising the head of the bed. Consider soluble fiber intake to help with bowel issues.  Continue a low fat diet.  Slow, purposeful weight loss is good. Consider small frequent meals.  Avoid overeating. Avoid any trigger foods including black pepper and caffeine, even decaf tea.  See the GERD handout. Consider also avoiding onions, garlic and processed meats with nitrites. Consider a chewable vitamin regularly.  The [place] pharmacies have those that are more easily absorbed.   Recommend getting your vitamin B-12 and Vitamin D levels checked.  Teaching Method Utilized:  Visual Auditory Hands on  Handouts given during visit include:  GERD Nutrition Therapy from [place]  List of Magnesium rich foods  Barriers to learning/adherence to lifestyle change: none  Demonstrated degree of understanding via:  Teach Back   Monitoring/Evaluation:  Dietary intake, exercise, and body weight prn.

## 2015-07-26 ENCOUNTER — Encounter (HOSPITAL_COMMUNITY): Payer: Self-pay | Admitting: Gastroenterology

## 2015-07-28 MED FILL — raNITIdine HCL 150 MG TABS: 150 | 30 days supply | Qty: 120 | Fill #1

## 2015-08-23 ENCOUNTER — Other Ambulatory Visit: Payer: Self-pay | Admitting: Family Medicine

## 2015-08-23 MED FILL — METOPROLOL SUCC ER 50 MG TA: 50 | 90 days supply | Qty: 90 | Fill #0

## 2015-08-26 ENCOUNTER — Encounter: Payer: 59 | Admitting: Family Medicine

## 2015-08-29 MED FILL — raNITIdine HCL 150 MG TABS: 150 | 90 days supply | Qty: 360 | Fill #2

## 2015-08-30 ENCOUNTER — Encounter: Payer: 59 | Admitting: Family Medicine

## 2015-09-12 ENCOUNTER — Other Ambulatory Visit: Payer: Self-pay

## 2015-09-12 MED ORDER — OFLOXACIN 0.3 % OP SOLN: OPHTHALMIC | Status: DC

## 2015-09-12 MED FILL — OFLOXACIN 0.3% EYE DROPS: 0.3 | 17 days supply | Qty: 5 | Fill #0

## 2015-09-30 ENCOUNTER — Encounter: Payer: Self-pay | Admitting: Family Medicine

## 2015-09-30 ENCOUNTER — Ambulatory Visit (INDEPENDENT_AMBULATORY_CARE_PROVIDER_SITE_OTHER): Payer: 59 | Admitting: Family Medicine

## 2015-09-30 ENCOUNTER — Ambulatory Visit (HOSPITAL_BASED_OUTPATIENT_CLINIC_OR_DEPARTMENT_OTHER)
Admission: RE | Admit: 2015-09-30 | Discharge: 2015-09-30 | Disposition: A | Discharge status: Home / Self Care | Payer: 59 | Source: Ambulatory Visit | Attending: Family Medicine | Admitting: Family Medicine

## 2015-09-30 VITALS — BP 122/82 | HR 81 | Temp 97.6°F | Ht 61.0 in | Wt 159.4 lb

## 2015-09-30 DIAGNOSIS — K449 Diaphragmatic hernia without obstruction or gangrene: Secondary | ICD-10-CM

## 2015-09-30 DIAGNOSIS — M62838 Other muscle spasm: Secondary | ICD-10-CM

## 2015-09-30 DIAGNOSIS — Z23 Encounter for immunization: Secondary | ICD-10-CM

## 2015-09-30 DIAGNOSIS — K219 Gastro-esophageal reflux disease without esophagitis: Secondary | ICD-10-CM | POA: Diagnosis not present

## 2015-09-30 DIAGNOSIS — Z860100 Personal history of colon polyps, unspecified: Secondary | ICD-10-CM

## 2015-09-30 DIAGNOSIS — I10 Essential (primary) hypertension: Secondary | ICD-10-CM | POA: Diagnosis not present

## 2015-09-30 DIAGNOSIS — Z0001 Encounter for general adult medical examination with abnormal findings: Secondary | ICD-10-CM

## 2015-09-30 DIAGNOSIS — L578 Other skin changes due to chronic exposure to nonionizing radiation: Secondary | ICD-10-CM

## 2015-09-30 DIAGNOSIS — E785 Hyperlipidemia, unspecified: Secondary | ICD-10-CM

## 2015-09-30 DIAGNOSIS — M8588 Other specified disorders of bone density and structure, other site: Secondary | ICD-10-CM | POA: Diagnosis not present

## 2015-09-30 DIAGNOSIS — Z0000 Encounter for general adult medical examination without abnormal findings: Secondary | ICD-10-CM

## 2015-09-30 DIAGNOSIS — Z780 Asymptomatic menopausal state: Secondary | ICD-10-CM | POA: Diagnosis not present

## 2015-09-30 DIAGNOSIS — M8580 Other specified disorders of bone density and structure, unspecified site: Secondary | ICD-10-CM | POA: Diagnosis not present

## 2015-09-30 DIAGNOSIS — Z1239 Encounter for other screening for malignant neoplasm of breast: Secondary | ICD-10-CM

## 2015-09-30 DIAGNOSIS — J381 Polyp of vocal cord and larynx: Secondary | ICD-10-CM

## 2015-09-30 DIAGNOSIS — K317 Polyp of stomach and duodenum: Secondary | ICD-10-CM

## 2015-09-30 HISTORY — DX: Polyp of vocal cord and larynx: J38.1

## 2015-09-30 HISTORY — DX: Hypocalcemia: E83.51

## 2015-09-30 HISTORY — DX: Polyp of stomach and duodenum: K31.7

## 2015-09-30 HISTORY — DX: Personal history of colon polyps, unspecified: Z86.0100

## 2015-09-30 HISTORY — DX: Personal history of colonic polyps: Z86.010

## 2015-09-30 HISTORY — DX: Other muscle spasm: M62.838

## 2015-09-30 LAB — COMPREHENSIVE METABOLIC PANEL
ALBUMIN: 4.2 g/dL (ref 3.5–5.2)
ALK PHOS: 57 U/L (ref 39–117)
ALT: 23 U/L (ref 0–35)
AST: 23 U/L (ref 0–37)
BUN: 15 mg/dL (ref 6–23)
CALCIUM: 9.5 mg/dL (ref 8.4–10.5)
CHLORIDE: 104 meq/L (ref 96–112)
CO2: 28 mEq/L (ref 19–32)
Creatinine, Ser: 0.77 mg/dL (ref 0.40–1.20)
GFR: 79.96 mL/min (ref >60.00)
Glucose, Bld: 92 mg/dL (ref 70–99)
POTASSIUM: 4 meq/L (ref 3.5–5.1)
SODIUM: 137 meq/L (ref 135–145)
TOTAL PROTEIN: 7.2 g/dL (ref 6.0–8.3)
Total Bilirubin: 0.4 mg/dL (ref 0.2–1.2)

## 2015-09-30 LAB — LIPID PANEL
CHOLESTEROL: 205 mg/dL — AB (ref 0–200)
HDL: 64.4 mg/dL (ref >39.00)
LDL CALC: 112 mg/dL — AB (ref 0–99)
NonHDL: 140.8
TRIGLYCERIDES: 145 mg/dL (ref 0.0–149.0)
Total CHOL/HDL Ratio: 3
VLDL: 29 mg/dL (ref 0.0–40.0)

## 2015-09-30 LAB — CBC
HEMATOCRIT: 33.9 % — AB (ref 36.0–46.0)
Hemoglobin: 11 g/dL — ABNORMAL LOW (ref 12.0–15.0)
MCHC: 32.6 g/dL (ref 30.0–36.0)
MCV: 78.9 fl (ref 78.0–100.0)
Platelets: 268 10*3/uL (ref 150.0–400.0)
RBC: 4.3 Mil/uL (ref 3.87–5.11)
RDW: 16.3 % — AB (ref 11.5–15.5)
WBC: 6.1 10*3/uL (ref 4.0–10.5)

## 2015-09-30 LAB — TSH: TSH: 0.78 u[IU]/mL (ref 0.35–4.50)

## 2015-09-30 LAB — MAGNESIUM: MAGNESIUM: 2.1 mg/dL (ref 1.5–2.5)

## 2015-09-30 LAB — VITAMIN D 25 HYDROXY (VIT D DEFICIENCY, FRACTURES): VITD: 23.4 ng/mL — AB (ref 30.00–100.00)

## 2015-09-30 MED ORDER — METOPROLOL SUCCINATE ER 50 MG PO TB24: ORAL_TABLET | ORAL | Status: DC

## 2015-09-30 NOTE — Assessment & Plan Note (Signed)
Check magnesium today, hydrate well

## 2015-09-30 NOTE — Progress Notes (Signed)
Pre visit review using our clinic review tool, if applicable. No additional management support is needed unless otherwise documented below in the visit note. 

## 2015-09-30 NOTE — Assessment & Plan Note (Addendum)
Avoid offending foods, start probiotics. Do not eat large meals in late evening and consider raising head of bed. Doing better on Ranitidine 150 mg po qid

## 2015-09-30 NOTE — Assessment & Plan Note (Signed)
Well controlled, no changes to meds. Encouraged heart healthy diet such as the DASH diet and exercise as tolerated.  °

## 2015-09-30 NOTE — Patient Instructions (Addendum)
Benefiber or Metamucil twice daily  NOW probiotics daily   Preventive Care for Adults, Female A healthy lifestyle and preventive care can promote health and wellness. Preventive health guidelines for women include the following key practices.  A routine yearly physical is a good way to check with your health care provider about your health and preventive screening. It is a chance to share any concerns and updates on your health and to receive a thorough exam.  Visit your dentist for a routine exam and preventive care every 6 months. Brush your teeth twice a day and floss once a day. Good oral hygiene prevents tooth decay and gum disease.  The frequency of eye exams is based on your age, health, family medical history, use of contact lenses, and other factors. Follow your health care provider's recommendations for frequency of eye exams.  Eat a healthy diet. Foods like vegetables, fruits, whole grains, low-fat dairy products, and lean protein foods contain the nutrients you need without too many calories. Decrease your intake of foods high in solid fats, added sugars, and salt. Eat the right amount of calories for you.Get information about a proper diet from your health care provider, if necessary.  Regular physical exercise is one of the most important things you can do for your health. Most adults should get at least 150 minutes of moderate-intensity exercise (any activity that increases your heart rate and causes you to sweat) each week. In addition, most adults need muscle-strengthening exercises on 2 or more days a week.  Maintain a healthy weight. The body mass index (BMI) is a screening tool to identify possible weight problems. It provides an estimate of body fat based on height and weight. Your health care provider can find your BMI and can help you achieve or maintain a healthy weight.For adults 20 years and older:  A BMI below 18.5 is considered underweight.  A BMI of 18.5 to 24.9 is  normal.  A BMI of 25 to 29.9 is considered overweight.  A BMI of 30 and above is considered obese.  Maintain normal blood lipids and cholesterol levels by exercising and minimizing your intake of saturated fat. Eat a balanced diet with plenty of fruit and vegetables. Blood tests for lipids and cholesterol should begin at age 7 and be repeated every 5 years. If your lipid or cholesterol levels are high, you are over 50, or you are at high risk for heart disease, you may need your cholesterol levels checked more frequently.Ongoing high lipid and cholesterol levels should be treated with medicines if diet and exercise are not working.  If you smoke, find out from your health care provider how to quit. If you do not use tobacco, do not start.  Lung cancer screening is recommended for adults aged 60-80 years who are at high risk for developing lung cancer because of a history of smoking. A yearly low-dose CT scan of the lungs is recommended for people who have at least a 30-pack-year history of smoking and are a current smoker or have quit within the past 15 years. A pack year of smoking is smoking an average of 1 pack of cigarettes a day for 1 year (for example: 1 pack a day for 30 years or 2 packs a day for 15 years). Yearly screening should continue until the smoker has stopped smoking for at least 15 years. Yearly screening should be stopped for people who develop a health problem that would prevent them from having lung cancer treatment.  If you are pregnant, do not drink alcohol. If you are breastfeeding, be very cautious about drinking alcohol. If you are not pregnant and choose to drink alcohol, do not have more than 1 drink per day. One drink is considered to be 12 ounces (355 mL) of beer, 5 ounces (148 mL) of wine, or 1.5 ounces (44 mL) of liquor.  Avoid use of street drugs. Do not share needles with anyone. Ask for help if you need support or instructions about stopping the use of  drugs.  High blood pressure causes heart disease and increases the risk of stroke. Your blood pressure should be checked at least every 1 to 2 years. Ongoing high blood pressure should be treated with medicines if weight loss and exercise do not work.  If you are 66-61 years old, ask your health care provider if you should take aspirin to prevent strokes.  Diabetes screening is done by taking a blood sample to check your blood glucose level after you have not eaten for a certain period of time (fasting). If you are not overweight and you do not have risk factors for diabetes, you should be screened once every 3 years starting at age 32. If you are overweight or obese and you are 35-60 years of age, you should be screened for diabetes every year as part of your cardiovascular risk assessment.  Breast cancer screening is essential preventive care for women. You should practice "breast self-awareness." This means understanding the normal appearance and feel of your breasts and may include breast self-examination. Any changes detected, no matter how small, should be reported to a health care provider. Women in their 63s and 30s should have a clinical breast exam (CBE) by a health care provider as part of a regular health exam every 1 to 3 years. After age 33, women should have a CBE every year. Starting at age 80, women should consider having a mammogram (breast X-ray test) every year. Women who have a family history of breast cancer should talk to their health care provider about genetic screening. Women at a high risk of breast cancer should talk to their health care providers about having an MRI and a mammogram every year.  Breast cancer gene (BRCA)-related cancer risk assessment is recommended for women who have family members with BRCA-related cancers. BRCA-related cancers include breast, ovarian, tubal, and peritoneal cancers. Having family members with these cancers may be associated with an increased  risk for harmful changes (mutations) in the breast cancer genes BRCA1 and BRCA2. Results of the assessment will determine the need for genetic counseling and BRCA1 and BRCA2 testing.  Your health care provider may recommend that you be screened regularly for cancer of the pelvic organs (ovaries, uterus, and vagina). This screening involves a pelvic examination, including checking for microscopic changes to the surface of your cervix (Pap test). You may be encouraged to have this screening done every 3 years, beginning at age 74.  For women ages 87-65, health care providers may recommend pelvic exams and Pap testing every 3 years, or they may recommend the Pap and pelvic exam, combined with testing for human papilloma virus (HPV), every 5 years. Some types of HPV increase your risk of cervical cancer. Testing for HPV may also be done on women of any age with unclear Pap test results.  Other health care providers may not recommend any screening for nonpregnant women who are considered low risk for pelvic cancer and who do not have symptoms. Ask your  health care provider if a screening pelvic exam is right for you.  If you have had past treatment for cervical cancer or a condition that could lead to cancer, you need Pap tests and screening for cancer for at least 20 years after your treatment. If Pap tests have been discontinued, your risk factors (such as having a new sexual partner) need to be reassessed to determine if screening should resume. Some women have medical problems that increase the chance of getting cervical cancer. In these cases, your health care provider may recommend more frequent screening and Pap tests.  Colorectal cancer can be detected and often prevented. Most routine colorectal cancer screening begins at the age of 83 years and continues through age 27 years. However, your health care provider may recommend screening at an earlier age if you have risk factors for colon cancer. On a  yearly basis, your health care provider may provide home test kits to check for hidden blood in the stool. Use of a small camera at the end of a tube, to directly examine the colon (sigmoidoscopy or colonoscopy), can detect the earliest forms of colorectal cancer. Talk to your health care provider about this at age 33, when routine screening begins. Direct exam of the colon should be repeated every 5-10 years through age 91 years, unless early forms of precancerous polyps or small growths are found.  People who are at an increased risk for hepatitis B should be screened for this virus. You are considered at high risk for hepatitis B if:  You were born in a country where hepatitis B occurs often. Talk with your health care provider about which countries are considered high risk.  Your parents were born in a high-risk country and you have not received a shot to protect against hepatitis B (hepatitis B vaccine).  You have HIV or AIDS.  You use needles to inject street drugs.  You live with, or have sex with, someone who has hepatitis B.  You get hemodialysis treatment.  You take certain medicines for conditions like cancer, organ transplantation, and autoimmune conditions.  Hepatitis C blood testing is recommended for all people born from [birth year] through 1965 and any individual with known risks for hepatitis C.  Practice safe sex. Use condoms and avoid high-risk sexual practices to reduce the spread of sexually transmitted infections (STIs). STIs include gonorrhea, chlamydia, syphilis, trichomonas, herpes, HPV, and human immunodeficiency virus (HIV). Herpes, HIV, and HPV are viral illnesses that have no cure. They can result in disability, cancer, and death.  You should be screened for sexually transmitted illnesses (STIs) including gonorrhea and chlamydia if:  You are sexually active and are younger than 24 years.  You are older than 24 years and your health care provider tells you that you are  at risk for this type of infection.  Your sexual activity has changed since you were last screened and you are at an increased risk for chlamydia or gonorrhea. Ask your health care provider if you are at risk.  If you are at risk of being infected with HIV, it is recommended that you take a prescription medicine daily to prevent HIV infection. This is called preexposure prophylaxis (PrEP). You are considered at risk if:  You are sexually active and do not regularly use condoms or know the HIV status of your partner(s).  You take drugs by injection.  You are sexually active with a partner who has HIV.  Talk with your health care provider about whether  you are at high risk of being infected with HIV. If you choose to begin PrEP, you should first be tested for HIV. You should then be tested every 3 months for as long as you are taking PrEP.  Osteoporosis is a disease in which the bones lose minerals and strength with aging. This can result in serious bone fractures or breaks. The risk of osteoporosis can be identified using a bone density scan. Women ages 65 years and over and women at risk for fractures or osteoporosis should discuss screening with their health care providers. Ask your health care provider whether you should take a calcium supplement or vitamin D to reduce the rate of osteoporosis.  Menopause can be associated with physical symptoms and risks. Hormone replacement therapy is available to decrease symptoms and risks. You should talk to your health care provider about whether hormone replacement therapy is right for you.  Use sunscreen. Apply sunscreen liberally and repeatedly throughout the day. You should seek shade when your shadow is shorter than you. Protect yourself by wearing long sleeves, pants, a wide-brimmed hat, and sunglasses year round, whenever you are outdoors.  Once a month, do a whole body skin exam, using a mirror to look at the skin on your back. Tell your health  care provider of new moles, moles that have irregular borders, moles that are larger than a pencil eraser, or moles that have changed in shape or color.  Stay current with required vaccines (immunizations).  Influenza vaccine. All adults should be immunized every year.  Tetanus, diphtheria, and acellular pertussis (Td, Tdap) vaccine. Pregnant women should receive 1 dose of Tdap vaccine during each pregnancy. The dose should be obtained regardless of the length of time since the last dose. Immunization is preferred during the 27th-36th week of gestation. An adult who has not previously received Tdap or who does not know her vaccine status should receive 1 dose of Tdap. This initial dose should be followed by tetanus and diphtheria toxoids (Td) booster doses every 10 years. Adults with an unknown or incomplete history of completing a 3-dose immunization series with Td-containing vaccines should begin or complete a primary immunization series including a Tdap dose. Adults should receive a Td booster every 10 years.  Varicella vaccine. An adult without evidence of immunity to varicella should receive 2 doses or a second dose if she has previously received 1 dose. Pregnant females who do not have evidence of immunity should receive the first dose after pregnancy. This first dose should be obtained before leaving the health care facility. The second dose should be obtained 4-8 weeks after the first dose.  Human papillomavirus (HPV) vaccine. Females aged 13-26 years who have not received the vaccine previously should obtain the 3-dose series. The vaccine is not recommended for use in pregnant females. However, pregnancy testing is not needed before receiving a dose. If a female is found to be pregnant after receiving a dose, no treatment is needed. In that case, the remaining doses should be delayed until after the pregnancy. Immunization is recommended for any person with an immunocompromised condition through  the age of 26 years if she did not get any or all doses earlier. During the 3-dose series, the second dose should be obtained 4-8 weeks after the first dose. The third dose should be obtained 24 weeks after the first dose and 16 weeks after the second dose.  Zoster vaccine. One dose is recommended for adults aged 60 years or older unless certain conditions   are present.  Measles, mumps, and rubella (MMR) vaccine. Adults born before [birth year] generally are considered immune to measles and mumps. Adults born in [birth year] or later should have 1 or more doses of MMR vaccine unless there is a contraindication to the vaccine or there is laboratory evidence of immunity to each of the three diseases. A routine second dose of MMR vaccine should be obtained at least 28 days after the first dose for students attending postsecondary schools, health care workers, or international travelers. People who received inactivated measles vaccine or an unknown type of measles vaccine during 1963-1967 should receive 2 doses of MMR vaccine. People who received inactivated mumps vaccine or an unknown type of mumps vaccine before 1979 and are at high risk for mumps infection should consider immunization with 2 doses of MMR vaccine. For females of childbearing age, rubella immunity should be determined. If there is no evidence of immunity, females who are not pregnant should be vaccinated. If there is no evidence of immunity, females who are pregnant should delay immunization until after pregnancy. Unvaccinated health care workers born before [birth year] who lack laboratory evidence of measles, mumps, or rubella immunity or laboratory confirmation of disease should consider measles and mumps immunization with 2 doses of MMR vaccine or rubella immunization with 1 dose of MMR vaccine.  Pneumococcal 13-valent conjugate (PCV13) vaccine. When indicated, a person who is uncertain of his immunization history and has no record of immunization should receive the  PCV13 vaccine. All adults 46 years of age and older should receive this vaccine. An adult aged 26 years or older who has certain medical conditions and has not been previously immunized should receive 1 dose of PCV13 vaccine. This PCV13 should be followed with a dose of pneumococcal polysaccharide (PPSV23) vaccine. Adults who are at high risk for pneumococcal disease should obtain the PPSV23 vaccine at least 8 weeks after the dose of PCV13 vaccine. Adults older than 65 years of age who have normal immune system function should obtain the PPSV23 vaccine dose at least 1 year after the dose of PCV13 vaccine.  Pneumococcal polysaccharide (PPSV23) vaccine. When PCV13 is also indicated, PCV13 should be obtained first. All adults aged 39 years and older should be immunized. An adult younger than age 63 years who has certain medical conditions should be immunized. Any person who resides in a nursing home or long-term care facility should be immunized. An adult smoker should be immunized. People with an immunocompromised condition and certain other conditions should receive both PCV13 and PPSV23 vaccines. People with human immunodeficiency virus (HIV) infection should be immunized as soon as possible after diagnosis. Immunization during chemotherapy or radiation therapy should be avoided. Routine use of PPSV23 vaccine is not recommended for American Indians, [place] Natives, or people younger than 65 years unless there are medical conditions that require PPSV23 vaccine. When indicated, people who have unknown immunization and have no record of immunization should receive PPSV23 vaccine. One-time revaccination 5 years after the first dose of PPSV23 is recommended for people aged 19-64 years who have chronic kidney failure, nephrotic syndrome, asplenia, or immunocompromised conditions. People who received 1-2 doses of PPSV23 before age 14 years should receive another dose of PPSV23 vaccine at age 30 years or later if at least  5 years have passed since the previous dose. Doses of PPSV23 are not needed for people immunized with PPSV23 at or after age 63 years.  Meningococcal vaccine. Adults with asplenia or persistent complement component deficiencies should receive 2 doses  of quadrivalent meningococcal conjugate (MenACWY-D) vaccine. The doses should be obtained at least 2 months apart. Microbiologists working with certain meningococcal bacteria, military recruits, people at risk during an outbreak, and people who travel to or live in countries with a high rate of meningitis should be immunized. A first-year college student up through age 21 years who is living in a residence hall should receive a dose if she did not receive a dose on or after her 16th birthday. Adults who have certain high-risk conditions should receive one or more doses of vaccine.  Hepatitis A vaccine. Adults who wish to be protected from this disease, have certain high-risk conditions, work with hepatitis A-infected animals, work in hepatitis A research labs, or travel to or work in countries with a high rate of hepatitis A should be immunized. Adults who were previously unvaccinated and who anticipate close contact with an international adoptee during the first 60 days after arrival in the [place] from a country with a high rate of hepatitis A should be immunized.  Hepatitis B vaccine. Adults who wish to be protected from this disease, have certain high-risk conditions, may be exposed to blood or other infectious body fluids, are household contacts or sex partners of hepatitis B positive people, are clients or workers in certain care facilities, or travel to or work in countries with a high rate of hepatitis B should be immunized.  Haemophilus influenzae type b (Hib) vaccine. A previously unvaccinated person with asplenia or sickle cell disease or having a scheduled splenectomy should receive 1 dose of Hib vaccine. Regardless of previous immunization, a  recipient of a hematopoietic stem cell transplant should receive a 3-dose series 6-12 months after her successful transplant. Hib vaccine is not recommended for adults with HIV infection. Preventive Services / Frequency Ages 19 to 39 years  Blood pressure check.** / Every 3-5 years.  Lipid and cholesterol check.** / Every 5 years beginning at age 20.  Clinical breast exam.** / Every 3 years for women in their 20s and 30s.  BRCA-related cancer risk assessment.** / For women who have family members with a BRCA-related cancer (breast, ovarian, tubal, or peritoneal cancers).  Pap test.** / Every 2 years from ages 21 through 29. Every 3 years starting at age 30 through age 65 or 70 with a history of 3 consecutive normal Pap tests.  HPV screening.** / Every 3 years from ages 30 through ages 65 to 70 with a history of 3 consecutive normal Pap tests.  Hepatitis C blood test.** / For any individual with known risks for hepatitis C.  Skin self-exam. / Monthly.  Influenza vaccine. / Every year.  Tetanus, diphtheria, and acellular pertussis (Tdap, Td) vaccine.** / Consult your health care provider. Pregnant women should receive 1 dose of Tdap vaccine during each pregnancy. 1 dose of Td every 10 years.  Varicella vaccine.** / Consult your health care provider. Pregnant females who do not have evidence of immunity should receive the first dose after pregnancy.  HPV vaccine. / 3 doses over 6 months, if 26 and younger. The vaccine is not recommended for use in pregnant females. However, pregnancy testing is not needed before receiving a dose.  Measles, mumps, rubella (MMR) vaccine.** / You need at least 1 dose of MMR if you were born in 1957 or later. You may also need a 2nd dose. For females of childbearing age, rubella immunity should be determined. If there is no evidence of immunity, females who are not pregnant should be   vaccinated. If there is no evidence of immunity, females who are pregnant  should delay immunization until after pregnancy.  Pneumococcal 13-valent conjugate (PCV13) vaccine.** / Consult your health care provider.  Pneumococcal polysaccharide (PPSV23) vaccine.** / 1 to 2 doses if you smoke cigarettes or if you have certain conditions.  Meningococcal vaccine.** / 1 dose if you are age 19 to 21 years and a first-year college student living in a residence hall, or have one of several medical conditions, you need to get vaccinated against meningococcal disease. You may also need additional booster doses.  Hepatitis A vaccine.** / Consult your health care provider.  Hepatitis B vaccine.** / Consult your health care provider.  Haemophilus influenzae type b (Hib) vaccine.** / Consult your health care provider. Ages 40 to 64 years  Blood pressure check.** / Every year.  Lipid and cholesterol check.** / Every 5 years beginning at age 20 years.  Lung cancer screening. / Every year if you are aged 55-80 years and have a 30-pack-year history of smoking and currently smoke or have quit within the past 15 years. Yearly screening is stopped once you have quit smoking for at least 15 years or develop a health problem that would prevent you from having lung cancer treatment.  Clinical breast exam.** / Every year after age 40 years.  BRCA-related cancer risk assessment.** / For women who have family members with a BRCA-related cancer (breast, ovarian, tubal, or peritoneal cancers).  Mammogram.** / Every year beginning at age 40 years and continuing for as long as you are in good health. Consult with your health care provider.  Pap test.** / Every 3 years starting at age 30 years through age 65 or 70 years with a history of 3 consecutive normal Pap tests.  HPV screening.** / Every 3 years from ages 30 years through ages 65 to 70 years with a history of 3 consecutive normal Pap tests.  Fecal occult blood test (FOBT) of stool. / Every year beginning at age 50 years and  continuing until age 75 years. You may not need to do this test if you get a colonoscopy every 10 years.  Flexible sigmoidoscopy or colonoscopy.** / Every 5 years for a flexible sigmoidoscopy or every 10 years for a colonoscopy beginning at age 50 years and continuing until age 75 years.  Hepatitis C blood test.** / For all people born from 1945 through 1965 and any individual with known risks for hepatitis C.  Skin self-exam. / Monthly.  Influenza vaccine. / Every year.  Tetanus, diphtheria, and acellular pertussis (Tdap/Td) vaccine.** / Consult your health care provider. Pregnant women should receive 1 dose of Tdap vaccine during each pregnancy. 1 dose of Td every 10 years.  Varicella vaccine.** / Consult your health care provider. Pregnant females who do not have evidence of immunity should receive the first dose after pregnancy.  Zoster vaccine.** / 1 dose for adults aged 60 years or older.  Measles, mumps, rubella (MMR) vaccine.** / You need at least 1 dose of MMR if you were born in 1957 or later. You may also need a second dose. For females of childbearing age, rubella immunity should be determined. If there is no evidence of immunity, females who are not pregnant should be vaccinated. If there is no evidence of immunity, females who are pregnant should delay immunization until after pregnancy.  Pneumococcal 13-valent conjugate (PCV13) vaccine.** / Consult your health care provider.  Pneumococcal polysaccharide (PPSV23) vaccine.** / 1 to 2 doses if you smoke   cigarettes or if you have certain conditions.  Meningococcal vaccine.** / Consult your health care provider.  Hepatitis A vaccine.** / Consult your health care provider.  Hepatitis B vaccine.** / Consult your health care provider.  Haemophilus influenzae type b (Hib) vaccine.** / Consult your health care provider. Ages 65 years and over  Blood pressure check.** / Every year.  Lipid and cholesterol check.** / Every 5 years  beginning at age 20 years.  Lung cancer screening. / Every year if you are aged 55-80 years and have a 30-pack-year history of smoking and currently smoke or have quit within the past 15 years. Yearly screening is stopped once you have quit smoking for at least 15 years or develop a health problem that would prevent you from having lung cancer treatment.  Clinical breast exam.** / Every year after age 40 years.  BRCA-related cancer risk assessment.** / For women who have family members with a BRCA-related cancer (breast, ovarian, tubal, or peritoneal cancers).  Mammogram.** / Every year beginning at age 40 years and continuing for as long as you are in good health. Consult with your health care provider.  Pap test.** / Every 3 years starting at age 30 years through age 65 or 70 years with 3 consecutive normal Pap tests. Testing can be stopped between 65 and 70 years with 3 consecutive normal Pap tests and no abnormal Pap or HPV tests in the past 10 years.  HPV screening.** / Every 3 years from ages 30 years through ages 65 or 70 years with a history of 3 consecutive normal Pap tests. Testing can be stopped between 65 and 70 years with 3 consecutive normal Pap tests and no abnormal Pap or HPV tests in the past 10 years.  Fecal occult blood test (FOBT) of stool. / Every year beginning at age 50 years and continuing until age 75 years. You may not need to do this test if you get a colonoscopy every 10 years.  Flexible sigmoidoscopy or colonoscopy.** / Every 5 years for a flexible sigmoidoscopy or every 10 years for a colonoscopy beginning at age 50 years and continuing until age 75 years.  Hepatitis C blood test.** / For all people born from 1945 through 1965 and any individual with known risks for hepatitis C.  Osteoporosis screening.** / A one-time screening for women ages 65 years and over and women at risk for fractures or osteoporosis.  Skin self-exam. / Monthly.  Influenza vaccine. /  Every year.  Tetanus, diphtheria, and acellular pertussis (Tdap/Td) vaccine.** / 1 dose of Td every 10 years.  Varicella vaccine.** / Consult your health care provider.  Zoster vaccine.** / 1 dose for adults aged 60 years or older.  Pneumococcal 13-valent conjugate (PCV13) vaccine.** / Consult your health care provider.  Pneumococcal polysaccharide (PPSV23) vaccine.** / 1 dose for all adults aged 65 years and older.  Meningococcal vaccine.** / Consult your health care provider.  Hepatitis A vaccine.** / Consult your health care provider.  Hepatitis B vaccine.** / Consult your health care provider.  Haemophilus influenzae type b (Hib) vaccine.** / Consult your health care provider. ** Family history and personal history of risk and conditions may change your health care provider's recommendations.   This information is not intended to replace advice given to you by your health care provider. Make sure you discuss any questions you have with your health care provider.   Document Released: 05/08/2001 Document Revised: 04/02/2014 Document Reviewed: 08/07/2010 Elsevier Interactive Patient Education 2016 Elsevier Inc.  

## 2015-09-30 NOTE — Assessment & Plan Note (Signed)
Check CMP today 

## 2015-09-30 NOTE — Assessment & Plan Note (Addendum)
Patient encouraged to maintain heart healthy diet, regular exercise, adequate sleep. Consider daily probiotics. Take medications as prescribed. Given and reviewed copy of ACP documents from [employer] and encouraged to complete and return. Given pneumonia shot today, MGM ordered. Labs ordered

## 2015-10-03 ENCOUNTER — Other Ambulatory Visit: Payer: Self-pay | Admitting: Family Medicine

## 2015-10-03 ENCOUNTER — Other Ambulatory Visit: Payer: Self-pay

## 2015-10-03 ENCOUNTER — Encounter: Payer: Self-pay | Admitting: Family Medicine

## 2015-10-03 MED ORDER — VITAMIN D (ERGOCALCIFEROL) 1.25 MG (50000 UNIT) PO CAPS: 50000.0000 [IU] | ORAL_CAPSULE | ORAL | Status: DC

## 2015-10-03 MED ORDER — ESOMEPRAZOLE MAGNESIUM 40 MG PO CPDR: 40.0000 mg | DELAYED_RELEASE_CAPSULE | Freq: Two times a day (BID) | ORAL | Status: DC

## 2015-10-03 MED FILL — NexIUM 40 MG CPDR: 40 | 90 days supply | Qty: 180 | Fill #0

## 2015-10-03 MED FILL — VIT D2 1.25 MG (50,000 UNIT: 1.25 MG | 28 days supply | Qty: 4 | Fill #0

## 2015-10-09 ENCOUNTER — Encounter: Payer: Self-pay | Admitting: Family Medicine

## 2015-10-09 DIAGNOSIS — M8580 Other specified disorders of bone density and structure, unspecified site: Secondary | ICD-10-CM

## 2015-10-09 HISTORY — DX: Other specified disorders of bone density and structure, unspecified site: M85.80

## 2015-10-09 NOTE — Progress Notes (Signed)
Patient ID: Carrie Elliott, female   DOB: 29-Sep-1950, 65 y.o.   MRN: [id number]   Subjective:    Patient ID: Carrie Elliott, female    DOB: 1950-04-21, 65 y.o.   MRN: [id number]  Chief Complaint  Patient presents with  . Annual Exam    HPI Patient is in today for annual exam. She is feeling well today. She notes that her heart burn is improved with nexium and now Ranitidine 150 mg po qid. No recent illness or hospitalization. She is eating well and trying to exercise. Her last colonoscopy 05/27/15 is to be repeated in 3 years. Denies CP/palp/SOB/HA/congestion/fevers/GI or GU c/o. Taking meds as prescribed  Past Medical History  Diagnosis Date  . Allergic rhinitis   . History of colonic polyps   . Reflux     cough  . Cystocele     grade 2  . Rectocele     grade 2  . IBS (irritable bowel syndrome)   . Rosacea   . Sternal fracture     1986  . GERD (gastroesophageal reflux disease)   . HTN (hypertension) 04/23/2013  . Chicken pox   . Measles   . Mumps   . Allergy   . Arthritis     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, shoulders, knees  . Endometriosis     with Menometrorhaghia   . Benign paroxysmal positional vertigo 07/12/2013  . Anemia 08/24/2014  . Sun-damaged skin 09/05/2014  . Hemorrhoids   . Muscle spasm 09/30/2015  . Hypocalcemia 09/30/2015  . 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 History of colonic polyps 09/30/2015    adenoma  . Gastroesophageal reflux disease with hiatal hernia     Cough Sees Dr Laretta Bolster   . Gastric polyps 09/30/2015  . Osteopenia 10/09/2015    Past Surgical History  Procedure Laterality Date  . Appendectomy  1989  . Cholecystectomy  1989  . Atypical nevus excision    . Hemorrhoid surgery  2005  . Left oophorectomy  [id number]    complicated by ureteral injuery requiring a ureteral stent   . Wisdom teeth removal  1964  . Total abdominal hysterectomy  2000  . Tubal ligation  1981  . Colonoscopy  2011  . Esophagogastroduodenoscopy  2011    with esophageal stent, perforation   . Mmk /  anterior vesicourethropexy / urethropexy  2000  . Cystoscopy w/ ureteral stent removal  1999  . Tonsillectomy and adenoidectomy    . Esophagogastroduodenoscopy (egd) with propofol N/A 05/27/2015    Procedure: ESOPHAGOGASTRODUODENOSCOPY (EGD) WITH PROPOFOL;  Surgeon: Clarene Essex, MD;  Location: [facility];  Service: Endoscopy;  Laterality: N/A;  . Colonoscopy with propofol N/A 05/27/2015    Procedure: COLONOSCOPY WITH PROPOFOL;  Surgeon: Clarene Essex, MD;  Location: [facility];  Service: Endoscopy;  Laterality: N/A;    Family History  Problem Relation Age of Onset  . Colon polyps Father   . Hypertension Father   . Heart failure Father   . Melanoma Father     multiple  . Heart attack Father     x2  . Heart disease Father     Bundle Block  . Meniere's disease Father   . Cancer Father     melanoma  . Osteoporosis Mother   . Transient ischemic attack Mother   . Cancer Mother     colon  . Heart disease Mother     afib  . Hyperlipidemia Mother   . Diabetes Paternal Grandmother   . Stroke Maternal Grandmother   .  Cancer Maternal Grandmother     skin, melanoma, sarcoma  . Colon polyps Maternal Grandmother   . Diverticulitis Maternal Grandmother   . Stroke Maternal Grandfather   . Heart disease Maternal Grandfather   . Obesity Brother     Social History   Social History  . Marital Status: Married    Spouse Name: N/A  . Number of Children: N/A  . Years of Education: N/A   Occupational History  . Not on file.   Social History Main Topics  . Smoking status: Never Smoker   . Smokeless tobacco: Never Used  . Alcohol Use: No  . Drug Use: No  . 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 Not on file   Social History Narrative    Outpatient Prescriptions Prior to Visit  Medication Sig Dispense Refill  . acetaminophen (TYLENOL) 500 MG tablet Take 1,000 mg by mouth every 6 (six) hours as  needed for headache (pain).    . benzocaine (ORAJEL) 10 % mucosal gel Use as directed 1 application in the mouth or throat as needed for mouth pain. Reported on 07/22/2015    . fexofenadine (ALLEGRA) 180 MG tablet Take 180 mg by mouth every morning.     . Lidocaine-Hydrocortisone Ace 3-0.5 % CREA Apply 1 application topically daily as needed (hemmoriods.).     Marland Kitchen meclizine (ANTIVERT) 25 MG tablet Take 1 tablet (25 mg total) by mouth 3 (three) times daily as needed for dizziness. 30 tablet 4  . mometasone (NASONEX) 50 MCG/ACT nasal spray Place 1-2 sprays into the nose daily as needed. Reported on 07/22/2015  5  . Multiple Vitamins-Minerals (MULTIVITAMIN ADULT) CHEW Chew 1 tablet by mouth 3 (three) times a week.    . ondansetron (ZOFRAN) 8 MG tablet Take 1 tablet (8 mg total) by mouth every 8 (eight) hours as needed for nausea or vomiting. 90 tablet 1  . SIMPLY SALINE NA Place 1 each into the nose daily.     Marland Kitchen esomeprazole (NEXIUM) 40 MG capsule Take 40 mg by mouth 2 (two) times daily before a meal.     . metoprolol succinate (TOPROL-XL) 50 MG 24 hr tablet TAKE 1 TABLET (50 MG TOTAL) BY MOUTH DAILY. 90 tablet 0  . ranitidine (ZANTAC) 150 MG tablet Take 300 mg by mouth daily as needed for heartburn.    Marland Kitchen azithromycin (ZITHROMAX Z-PAK) 250 MG tablet Take 2 tablets today then one tablet day 2 thru 5 (Patient not taking: Reported on 07/22/2015) 6 each 0  . dextromethorphan-guaiFENesin (MUCINEX DM) 30-600 MG 12hr tablet Take 2 tablets by mouth 2 (two) times daily as needed for cough (congestion.).    Marland Kitchen fluticasone (VERAMYST) 27.5 MCG/SPRAY nasal spray Place 2 sprays into the nose daily. Reported on 07/22/2015    . ofloxacin (OCUFLOX) 0.3 % ophthalmic solution ONE DROP  EACH EYE THREE TIMES A DAY FOR 7 DAYS 10 mL 0   No facility-administered medications prior to visit.    Allergies  Allergen Reactions  . Cefdinir     HIVES  . Celebrex [Celecoxib]     ABDOMINAL PAIN   . Ciprofloxacin     ABDOMINAL PAIN    . Codeine     NAUSEA VOMITING - can take with zofran  . Pseudoephedrine Hcl Er     TACHYCARDIA     Review of Systems  Constitutional: Negative for fever, chills and malaise/fatigue.  HENT: Negative for congestion and hearing loss.   Eyes: Negative for discharge.  Respiratory: Negative for cough, sputum production and shortness of breath.   Cardiovascular: Negative for chest pain, palpitations and leg swelling.  Gastrointestinal: Positive for heartburn. Negative for nausea, vomiting, abdominal pain, diarrhea, constipation and blood in stool.  Genitourinary: Negative for dysuria, urgency, frequency and hematuria.  Musculoskeletal: Negative for myalgias, back pain and falls.  Skin: Negative for rash.  Neurological: Negative for dizziness, sensory change, loss of consciousness, weakness and headaches.  Endo/Heme/Allergies: Negative for environmental allergies. Does not bruise/bleed easily.  Psychiatric/Behavioral: Negative for depression and suicidal ideas. The patient is not nervous/anxious and does not have insomnia.        Objective:    Physical Exam  Constitutional: She is oriented to person, place, and time. She appears well-developed and well-nourished. No distress.  HENT:  Head: Normocephalic and atraumatic.  Eyes: Conjunctivae are normal.  Neck: Neck supple. No thyromegaly present.  Cardiovascular: Normal rate, regular rhythm and normal heart sounds.   No murmur heard. Pulmonary/Chest: Effort normal and breath sounds normal. No respiratory distress.  Abdominal: Soft. Bowel sounds are normal. She exhibits no distension and no mass. There is no tenderness.  Musculoskeletal: She exhibits no edema.  Lymphadenopathy:    She has no cervical adenopathy.  Neurological: She is alert and oriented to person, place, and time.  Skin: Skin is warm and dry.  Psychiatric: She has a normal mood and affect. Her behavior is normal.    BP 122/82 mmHg  Pulse 81  Temp(Src) 97.6 F (36.4  C) (Oral)  Ht 5\' 1"  (1.549 m)  Wt 159 lb 6 oz (72.292 kg)  BMI 30.13 kg/m2  SpO2 99% Wt Readings from Last 3 Encounters:  09/30/15 159 lb 6 oz (72.292 kg)  07/22/15 160 lb (72.576 kg)  05/27/15 159 lb (72.122 kg)     Lab Results  Component Value Date   WBC 6.1 09/30/2015   HGB 11.0* 09/30/2015   HCT 33.9* 09/30/2015   PLT 268.0 09/30/2015   GLUCOSE 92 09/30/2015   CHOL 205* 09/30/2015   TRIG 145.0 09/30/2015   HDL 64.40 09/30/2015   LDLCALC 112* 09/30/2015   ALT 23 09/30/2015   AST 23 09/30/2015   NA 137 09/30/2015   K 4.0 09/30/2015   CL 104 09/30/2015   CREATININE 0.77 09/30/2015   BUN 15 09/30/2015   CO2 28 09/30/2015   TSH 0.78 09/30/2015   INR 1.19 02/07/2010    Lab Results  Component Value Date   TSH 0.78 09/30/2015   Lab Results  Component Value Date   WBC 6.1 09/30/2015   HGB 11.0* 09/30/2015   HCT 33.9* 09/30/2015   MCV 78.9 09/30/2015   PLT 268.0 09/30/2015   Lab Results  Component Value Date   NA 137 09/30/2015   K 4.0 09/30/2015   CO2 28 09/30/2015   GLUCOSE 92 09/30/2015   BUN 15 09/30/2015   CREATININE 0.77 09/30/2015   BILITOT 0.4 09/30/2015   ALKPHOS 57 09/30/2015   AST 23 09/30/2015   ALT 23 09/30/2015   PROT 7.2 09/30/2015   ALBUMIN 4.2 09/30/2015   CALCIUM 9.5 09/30/2015   GFR 79.96 09/30/2015   Lab Results  Component Value Date   CHOL 205* 09/30/2015   Lab Results  Component Value Date   HDL 64.40 09/30/2015   Lab Results  Component Value Date   LDLCALC 112* 09/30/2015   Lab Results  Component Value Date   TRIG  145.0 09/30/2015   Lab Results  Component Value Date   CHOLHDL 3 09/30/2015   No results found for: HGBA1C     Assessment & Plan:   Problem List Items Addressed This Visit    HTN (hypertension) (Chronic)    Well controlled, no changes to meds. Encouraged heart healthy diet such as the DASH diet and exercise as tolerated.       Relevant Medications   metoprolol succinate (TOPROL-XL) 50 MG 24 hr  tablet   Other Relevant Orders   TSH (Completed)   CBC (Completed)   Lipid panel (Completed)   Comprehensive metabolic panel (Completed)   Gastroesophageal reflux disease with hiatal hernia    Avoid offending foods, start probiotics. Do not eat large meals in late evening and consider raising head of bed. Doing better on Ranitidine 150 mg po qid      Relevant Medications   ranitidine (ZANTAC) 150 MG tablet   Preventative health care - Primary    Patient encouraged to maintain heart healthy diet, regular exercise, adequate sleep. Consider daily probiotics. Take medications as prescribed. Given and reviewed copy of ACP documents from [employer] and encouraged to complete and return. Given pneumonia shot today, MGM ordered. Labs ordered      Relevant Orders   TSH (Completed)   CBC (Completed)   Lipid panel (Completed)   Comprehensive metabolic panel (Completed)   Sun-damaged skin    Follows with Dr Renda Rolls. No new concerns      Hypocalcemia    Check CMP today      Vocal cord polyp   History of colonic polyps   Gastric polyps   Osteopenia    Encouraged to get adequate exercise, calcium and vitamin d intake      Relevant Orders   Vitamin D (25 hydroxy) (Completed)   DG Bone Density (Completed)   RESOLVED: Muscle spasm    Check magnesium today, hydrate well      Relevant Orders   Magnesium (Completed)    Other Visit Diagnoses    Dyslipidemia        Relevant Orders    TSH (Completed)    CBC (Completed)    Lipid panel (Completed)    Comprehensive metabolic panel (Completed)    Gastroesophageal reflux disease, esophagitis presence not specified        Relevant Medications    ranitidine (ZANTAC) 150 MG tablet    Other Relevant Orders    TSH (Completed)    CBC (Completed)    Lipid panel (Completed)    Comprehensive metabolic panel (Completed)    Breast cancer screening        Relevant Orders    MM Digital Screening    Need for vaccination with  13-polyvalent pneumococcal conjugate vaccine        Relevant Orders    Pneumococcal conjugate vaccine 13-valent (Completed)       I have discontinued Carrie Elliott's fluticasone, dextromethorphan-guaiFENesin, azithromycin, and ofloxacin. I am also having her maintain her fexofenadine, acetaminophen, benzocaine, Lidocaine-Hydrocortisone Ace, SIMPLY SALINE NA, meclizine, ondansetron, mometasone, MULTIVITAMIN ADULT, ranitidine, and metoprolol succinate.  Meds ordered this encounter  Medications  . ranitidine (ZANTAC) 150 MG tablet    Sig: Take 150 mg by mouth 4 (four) times daily.  . metoprolol succinate (TOPROL-XL) 50 MG 24 hr tablet    Sig: TAKE 1 TABLET (50 MG TOTAL) BY MOUTH DAILY.    Dispense:  90 tablet    Refill:  3     Yvetta Drotar,  Erline Levine, MD

## 2015-10-09 NOTE — Assessment & Plan Note (Signed)
Encouraged to get adequate exercise, calcium and vitamin d intake 

## 2015-10-09 NOTE — Assessment & Plan Note (Signed)
Follows with Dr Renda Rolls. No new concerns

## 2015-10-12 DIAGNOSIS — J381 Polyp of vocal cord and larynx: Secondary | ICD-10-CM | POA: Diagnosis not present

## 2015-11-03 MED FILL — VIT D2 1.25 MG (50,000 UNIT: 1.25 MG | 28 days supply | Qty: 4 | Fill #1

## 2015-11-14 MED FILL — METOPROLOL SUCC ER 50 MG TA: 50 | 90 days supply | Qty: 90 | Fill #0

## 2015-11-22 MED FILL — raNITIdine HCL 150 MG TABS: 150 | 90 days supply | Qty: 360 | Fill #3

## 2015-11-22 MED FILL — MOMETASONE FUROATE 50 MCG S: 50 | 90 days supply | Qty: 51 | Fill #4

## 2015-12-01 MED FILL — VIT D2 1.25 MG (50,000 UNIT: 1.25 MG | 28 days supply | Qty: 4 | Fill #2

## 2015-12-02 ENCOUNTER — Encounter: Payer: Self-pay | Admitting: Family Medicine

## 2015-12-19 ENCOUNTER — Ambulatory Visit (INDEPENDENT_AMBULATORY_CARE_PROVIDER_SITE_OTHER): Payer: 59

## 2015-12-19 DIAGNOSIS — Z23 Encounter for immunization: Secondary | ICD-10-CM | POA: Diagnosis not present

## 2015-12-28 MED FILL — VIT D2 1.25 MG (50,000 UNIT: 1.25 MG | 28 days supply | Qty: 4 | Fill #3

## 2016-01-02 ENCOUNTER — Ambulatory Visit (HOSPITAL_BASED_OUTPATIENT_CLINIC_OR_DEPARTMENT_OTHER)
Admission: RE | Admit: 2016-01-02 | Discharge: 2016-01-02 | Disposition: A | Discharge status: Home / Self Care | Payer: 59 | Source: Ambulatory Visit | Attending: Family Medicine | Admitting: Family Medicine

## 2016-01-02 DIAGNOSIS — R928 Other abnormal and inconclusive findings on diagnostic imaging of breast: Secondary | ICD-10-CM | POA: Diagnosis not present

## 2016-01-02 DIAGNOSIS — Z1239 Encounter for other screening for malignant neoplasm of breast: Secondary | ICD-10-CM

## 2016-01-02 DIAGNOSIS — Z1231 Encounter for screening mammogram for malignant neoplasm of breast: Secondary | ICD-10-CM | POA: Insufficient documentation

## 2016-01-02 MED FILL — NexIUM 40 MG CPDR: 40 | 30 days supply | Qty: 60 | Fill #1

## 2016-01-05 ENCOUNTER — Telehealth: Payer: Self-pay | Admitting: Family Medicine

## 2016-01-05 ENCOUNTER — Other Ambulatory Visit: Payer: Self-pay | Admitting: Family Medicine

## 2016-01-05 DIAGNOSIS — R928 Other abnormal and inconclusive findings on diagnostic imaging of breast: Secondary | ICD-10-CM

## 2016-01-05 NOTE — Telephone Encounter (Signed)
Called the breast center to have them refax request.

## 2016-01-05 NOTE — Telephone Encounter (Signed)
Patient informed I have called the Breast center to have them refax order to fax number in the back.  Once arrives will have PCP signed/fax

## 2016-01-05 NOTE — Telephone Encounter (Signed)
[place] Relationship to patient: Can be [phone] Pharmacy:  Reason for call:Need order signed for Diagnostic Mammogram and Breast US, patient is scheduled tomorrow.

## 2016-01-05 NOTE — Telephone Encounter (Signed)
Patient had a mammogram done recently and they called to let her know that the result indicated that there needs to be further testing done. Patient is trying to get into an appointment with them tomorrow at 11:10am. However, they need orders for her. Please advise.  Patient would like a call when we get these orders in.   Patient [id number]

## 2016-01-05 NOTE — Telephone Encounter (Signed)
Order has arrived and is on PCPs desk/will sign/then fax to the [place].

## 2016-01-06 ENCOUNTER — Ambulatory Visit
Admission: RE | Admit: 2016-01-06 | Discharge: 2016-01-06 | Disposition: A | Discharge status: Home / Self Care | Payer: 59 | Source: Ambulatory Visit | Attending: Family Medicine | Admitting: Family Medicine

## 2016-01-06 DIAGNOSIS — R928 Other abnormal and inconclusive findings on diagnostic imaging of breast: Secondary | ICD-10-CM

## 2016-01-26 MED FILL — VIT D2 1.25 MG (50,000 UNIT: 1.25 MG | 28 days supply | Qty: 4 | Fill #4

## 2016-01-30 MED FILL — NexIUM 40 MG CPDR: 40 | 30 days supply | Qty: 60 | Fill #2

## 2016-02-01 ENCOUNTER — Encounter: Payer: Self-pay | Admitting: Family Medicine

## 2016-02-15 MED FILL — METOPROLOL SUCC ER 50 MG TA: 50 | 90 days supply | Qty: 90 | Fill #1

## 2016-02-27 MED FILL — NexIUM 40 MG CPDR: 40 | 90 days supply | Qty: 180 | Fill #3

## 2016-02-27 MED FILL — raNITIdine HCL 150 MG TABS: 150 | 90 days supply | Qty: 360 | Fill #4

## 2016-02-28 MED FILL — MOMETASONE FUROATE 50 MCG S: 50 | 90 days supply | Qty: 51 | Fill #5

## 2016-04-06 ENCOUNTER — Encounter: Payer: Self-pay | Admitting: Family Medicine

## 2016-04-06 ENCOUNTER — Ambulatory Visit (INDEPENDENT_AMBULATORY_CARE_PROVIDER_SITE_OTHER): Payer: 59 | Admitting: Family Medicine

## 2016-04-06 VITALS — BP 132/82 | HR 67 | Temp 97.9°F | Wt 168.2 lb

## 2016-04-06 DIAGNOSIS — Z0000 Encounter for general adult medical examination without abnormal findings: Secondary | ICD-10-CM

## 2016-04-06 DIAGNOSIS — E559 Vitamin D deficiency, unspecified: Secondary | ICD-10-CM

## 2016-04-06 DIAGNOSIS — K449 Diaphragmatic hernia without obstruction or gangrene: Secondary | ICD-10-CM

## 2016-04-06 DIAGNOSIS — M25551 Pain in right hip: Secondary | ICD-10-CM

## 2016-04-06 DIAGNOSIS — I10 Essential (primary) hypertension: Secondary | ICD-10-CM | POA: Diagnosis not present

## 2016-04-06 DIAGNOSIS — M8580 Other specified disorders of bone density and structure, unspecified site: Secondary | ICD-10-CM | POA: Diagnosis not present

## 2016-04-06 DIAGNOSIS — J381 Polyp of vocal cord and larynx: Secondary | ICD-10-CM

## 2016-04-06 DIAGNOSIS — B372 Candidiasis of skin and nail: Secondary | ICD-10-CM

## 2016-04-06 DIAGNOSIS — H8110 Benign paroxysmal vertigo, unspecified ear: Secondary | ICD-10-CM

## 2016-04-06 DIAGNOSIS — K219 Gastro-esophageal reflux disease without esophagitis: Secondary | ICD-10-CM

## 2016-04-06 HISTORY — DX: Vitamin D deficiency, unspecified: E55.9

## 2016-04-06 LAB — COMPREHENSIVE METABOLIC PANEL
ALT: 14 U/L (ref 0–35)
AST: 16 U/L (ref 0–37)
Albumin: 4.3 g/dL (ref 3.5–5.2)
Alkaline Phosphatase: 59 U/L (ref 39–117)
BUN: 20 mg/dL (ref 6–23)
CHLORIDE: 105 meq/L (ref 96–112)
CO2: 26 meq/L (ref 19–32)
Calcium: 9.7 mg/dL (ref 8.4–10.5)
Creatinine, Ser: 0.79 mg/dL (ref 0.40–1.20)
GFR: 77.5 mL/min (ref >60.00)
GLUCOSE: 91 mg/dL (ref 70–99)
POTASSIUM: 4.2 meq/L (ref 3.5–5.1)
SODIUM: 137 meq/L (ref 135–145)
TOTAL PROTEIN: 7.4 g/dL (ref 6.0–8.3)
Total Bilirubin: 0.4 mg/dL (ref 0.2–1.2)

## 2016-04-06 LAB — CBC
HEMATOCRIT: 34 % — AB (ref 36.0–46.0)
HEMOGLOBIN: 11.1 g/dL — AB (ref 12.0–15.0)
MCHC: 32.7 g/dL (ref 30.0–36.0)
MCV: 76.9 fl — ABNORMAL LOW (ref 78.0–100.0)
Platelets: 285 10*3/uL (ref 150.0–400.0)
RBC: 4.42 Mil/uL (ref 3.87–5.11)
RDW: 15.6 % — ABNORMAL HIGH (ref 11.5–15.5)
WBC: 5.2 10*3/uL (ref 4.0–10.5)

## 2016-04-06 LAB — TSH: TSH: 0.71 u[IU]/mL (ref 0.35–4.50)

## 2016-04-06 LAB — HEPATITIS C ANTIBODY: HCV AB: NEGATIVE

## 2016-04-06 MED ORDER — NYSTATIN 100000 UNIT/GM EX OINT: 1.0000 "application " | TOPICAL_OINTMENT | Freq: Two times a day (BID) | CUTANEOUS | 1 refills | Status: DC

## 2016-04-06 MED ORDER — MECLIZINE HCL 25 MG PO TABS: 25.0000 mg | ORAL_TABLET | Freq: Three times a day (TID) | ORAL | 2 refills | Status: DC | PRN

## 2016-04-06 MED ORDER — MOMETASONE FUROATE 50 MCG/ACT NA SUSP: 1.0000 | Freq: Every day | NASAL | 5 refills | Status: DC | PRN

## 2016-04-06 MED FILL — MECLIZINE 25 MG TABLET: 25 | 10 days supply | Qty: 30 | Fill #0

## 2016-04-06 MED FILL — ONDANSETRON HCL 8 MG TABLET: 8 | 30 days supply | Qty: 90 | Fill #1

## 2016-04-06 MED FILL — NYSTATIN 100,000 UNITS/GM O: 100000 | 15 days supply | Qty: 30 | Fill #0

## 2016-04-06 NOTE — Assessment & Plan Note (Signed)
Need a Vit D check today, continue a daily supplement

## 2016-04-06 NOTE — Patient Instructions (Addendum)
Bone density shows osteopenia, which is thinner than normal but not as bad as osteoporosis. Recommend calcium intake of 1200 to 1500 mg daily, divided into roughly 3 doses. Best source is the diet and a single dairy serving is about 500 mg, a supplement of calcium citrate once or twice daily to balance diet is fine if not getting enough in diet. Also need Vitamin D 2000 IU caps, 1 cap daily. Also recommend weight baring exercise on hips and upper body to keep bones strong  Hypertension Hypertension, commonly called high blood pressure, is when the force of blood pumping through your arteries is too strong. Your arteries are the blood vessels that carry blood from your heart throughout your body. A blood pressure reading consists of a higher number over a lower number, such as 110/72. The higher number (systolic) is the pressure inside your arteries when your heart pumps. The lower number (diastolic) is the pressure inside your arteries when your heart relaxes. Ideally you want your blood pressure below 120/80. Hypertension forces your heart to work harder to pump blood. Your arteries may become narrow or stiff. Having untreated or uncontrolled hypertension can cause heart attack, stroke, kidney disease, and other problems. What increases the risk? Some risk factors for high blood pressure are controllable. Others are not. Risk factors you cannot control include:  Race. You may be at higher risk if you are African American.  Age. Risk increases with age.  Gender. Men are at higher risk than women before age 82 years. After age 15, women are at higher risk than men. Risk factors you can control include:  Not getting enough exercise or physical activity.  Being overweight.  Getting too much fat, sugar, calories, or salt in your diet.  Drinking too much alcohol. What are the signs or symptoms? Hypertension does not usually cause signs or symptoms. Extremely high blood pressure (hypertensive  crisis) may cause headache, anxiety, shortness of breath, and nosebleed. How is this diagnosed? To check if you have hypertension, your health care provider will measure your blood pressure while you are seated, with your arm held at the level of your heart. It should be measured at least twice using the same arm. Certain conditions can cause a difference in blood pressure between your right and left arms. A blood pressure reading that is higher than normal on one occasion does not mean that you need treatment. If it is not clear whether you have high blood pressure, you may be asked to return on a different day to have your blood pressure checked again. Or, you may be asked to monitor your blood pressure at home for 1 or more weeks. How is this treated? Treating high blood pressure includes making lifestyle changes and possibly taking medicine. Living a healthy lifestyle can help lower high blood pressure. You may need to change some of your habits. Lifestyle changes may include:  Following the DASH diet. This diet is high in fruits, vegetables, and whole grains. It is low in salt, red meat, and added sugars.  Keep your sodium intake below 2,300 mg per day.  Getting at least 30-45 minutes of aerobic exercise at least 4 times per week.  Losing weight if necessary.  Not smoking.  Limiting alcoholic beverages.  Learning ways to reduce stress. Your health care provider may prescribe medicine if lifestyle changes are not enough to get your blood pressure under control, and if one of the following is true:  You are 5-51 years of age  and your systolic blood pressure is above 140.  You are 23 years of age or older, and your systolic blood pressure is above 150.  Your diastolic blood pressure is above 90.  You have diabetes, and your systolic blood pressure is over [id number] or your diastolic blood pressure is over 90.  You have kidney disease and your blood pressure is above 140/90.  You have  heart disease and your blood pressure is above 140/90. Your personal target blood pressure may vary depending on your medical conditions, your age, and other factors. Follow these instructions at home:  Have your blood pressure rechecked as directed by your health care provider.  Take medicines only as directed by your health care provider. Follow the directions carefully. Blood pressure medicines must be taken as prescribed. The medicine does not work as well when you skip doses. Skipping doses also puts you at risk for problems.  Do not smoke.  Monitor your blood pressure at home as directed by your health care provider. Contact a health care provider if:  You think you are having a reaction to medicines taken.  You have recurrent headaches or feel dizzy.  You have swelling in your ankles.  You have trouble with your vision. Get help right away if:  You develop a severe headache or confusion.  You have unusual weakness, numbness, or feel faint.  You have severe chest or abdominal pain.  You vomit repeatedly.  You have trouble breathing. This information is not intended to replace advice given to you by your health care provider. Make sure you discuss any questions you have with your health care provider. Document Released: 03/12/2005 Document Revised: 08/18/2015 Document Reviewed: 01/02/2013 Elsevier Interactive Patient Education  2017 [employer].

## 2016-04-06 NOTE — Progress Notes (Signed)
Pre visit review using our clinic review tool, if applicable. No additional management support is needed unless otherwise documented below in the visit note. 

## 2016-04-06 NOTE — Assessment & Plan Note (Signed)
Patient is willing to have Hep C antibody checked today. ordered

## 2016-04-06 NOTE — Assessment & Plan Note (Signed)
Given Nystatin to use prn and cleanse with warm soap and water and restart probiotics

## 2016-04-06 NOTE — Assessment & Plan Note (Signed)
Infrequent and mild, given refill on Meclizine to use prn and encouraged to stay well hydrated

## 2016-04-06 NOTE — Assessment & Plan Note (Signed)
Encouraged to get adequate exercise, calcium and vitamin d intake 

## 2016-04-06 NOTE — Progress Notes (Signed)
Subjective:    Patient ID: Carrie Elliott, female    DOB: 02-Jun-1950, 66 y.o.   MRN: [id number]  Chief Complaint  Patient presents with  . Follow-up    HPI Patient is in today for follow up patient has no acute concerns. She does note some intermittent right hip pain but manages her ADLs well most days. Pain does worsen upon lying down and trying to sleep. No recent fall or injury. She denies any recent febrile illness or hospitalizations. Denies CP/palp/SOB/HA/congestion/fevers or GU c/o. Taking meds as prescribed. Notes some reflux without meds. This am noted some red patches under b/l breasts. Mildly itchy and she does not remember seeing them before until today.   Past Medical History:  Diagnosis Date  . Allergic rhinitis   . Allergy   . Anemia 08/24/2014  . Arthritis    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, shoulders, knees  . Benign paroxysmal positional vertigo 07/12/2013  . Chicken pox   . Cystocele    grade 2  . Endometriosis    with Menometrorhaghia   . Gastric polyps 09/30/2015  . Gastroesophageal reflux disease with hiatal hernia    Cough Sees Dr Laretta Bolster   . GERD (gastroesophageal reflux disease)   . Hemorrhoids   . History of colonic polyps   . History of colonic polyps 09/30/2015   adenoma  . HTN (hypertension) 04/23/2013  . Hypocalcemia 09/30/2015  . IBS (irritable bowel syndrome)   . Measles   . Mumps   . Muscle spasm 09/30/2015  . Osteopenia 10/09/2015  . Rectocele    grade 2  . Reflux    cough  . Rosacea   . Sternal fracture    1986  . Sun-damaged skin 09/05/2014  . Vitamin D deficiency 04/06/2016  . 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 APPENDECTOMY  1989  . atypical nevus excision    . CHOLECYSTECTOMY  1989  . COLONOSCOPY  2011  . COLONOSCOPY WITH PROPOFOL N/A 05/27/2015   Procedure: COLONOSCOPY WITH PROPOFOL;  Surgeon: Clarene Essex, MD;  Location: [facility];  Service: Endoscopy;  Laterality: N/A;  . CYSTOSCOPY W/ URETERAL STENT  REMOVAL  1999  . ESOPHAGOGASTRODUODENOSCOPY  2011   with esophageal stent, perforation   . ESOPHAGOGASTRODUODENOSCOPY (EGD) WITH PROPOFOL N/A 05/27/2015   Procedure: ESOPHAGOGASTRODUODENOSCOPY (EGD) WITH PROPOFOL;  Surgeon: Clarene Essex, MD;  Location: [facility];  Service: Endoscopy;  Laterality: N/A;  . HEMORRHOID SURGERY  2005  . LEFT OOPHORECTOMY  [id number]   complicated by ureteral injuery requiring a ureteral stent   . MMK / ANTERIOR VESICOURETHROPEXY / URETHROPEXY  2000  . TONSILLECTOMY AND ADENOIDECTOMY    . TOTAL ABDOMINAL HYSTERECTOMY  2000  . TUBAL LIGATION  1981  . wisdom teeth removal  1964    Family History  Problem Relation Age of Onset  . Colon polyps Father   . Hypertension Father   . Heart failure Father   . Melanoma Father     multiple  . Heart attack Father     x2  . Heart disease Father     Bundle Block  . Meniere's disease Father   . Cancer Father     melanoma  . Osteoporosis Mother   . Transient ischemic attack Mother   . Cancer Mother     colon  . Heart disease Mother     afib  . Hyperlipidemia Mother   . Diabetes Paternal Grandmother   . Stroke Maternal Grandmother   .  Cancer Maternal Grandmother     skin, melanoma, sarcoma  . Colon polyps Maternal Grandmother   . Diverticulitis Maternal Grandmother   . Stroke Maternal Grandfather   . Heart disease Maternal Grandfather   . Obesity Brother     Social History   Social History  . Marital status: Married    Spouse name: N/A  . Number of children: N/A  . Years of education: N/A   Occupational History  . Not on file.   Social History Main Topics  . Smoking status: Never Smoker  . Smokeless tobacco: Never Used  . Alcohol use No  . Drug use: No  . 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 Not on file   Social History Narrative  . No narrative on file    Outpatient Medications Prior to Visit    Medication Sig Dispense Refill  . acetaminophen (TYLENOL) 500 MG tablet Take 1,000 mg by mouth every 6 (six) hours as needed for headache (pain).    . benzocaine (ORAJEL) 10 % mucosal gel Use as directed 1 application in the mouth or throat as needed for mouth pain. Reported on 07/22/2015    . esomeprazole (NEXIUM) 40 MG capsule Take 1 capsule (40 mg total) by mouth 2 (two) times daily before a meal. 180 capsule 3  . fexofenadine (ALLEGRA) 180 MG tablet Take 180 mg by mouth every morning.     . Lidocaine-Hydrocortisone Ace 3-0.5 % CREA Apply 1 application topically daily as needed (hemmoriods.).     Marland Kitchen metoprolol succinate (TOPROL-XL) 50 MG 24 hr tablet TAKE 1 TABLET (50 MG TOTAL) BY MOUTH DAILY. 90 tablet 3  . Multiple Vitamins-Minerals (MULTIVITAMIN ADULT) CHEW Chew 1 tablet by mouth 3 (three) times a week.    . ondansetron (ZOFRAN) 8 MG tablet Take 1 tablet (8 mg total) by mouth every 8 (eight) hours as needed for nausea or vomiting. 90 tablet 1  . ranitidine (ZANTAC) 150 MG tablet Take 150 mg by mouth 4 (four) times daily.    Marland Kitchen SIMPLY SALINE NA Place 1 each into the nose daily.     . meclizine (ANTIVERT) 25 MG tablet Take 1 tablet (25 mg total) by mouth 3 (three) times daily as needed for dizziness. 30 tablet 4  . mometasone (NASONEX) 50 MCG/ACT nasal spray Place 1-2 sprays into the nose daily as needed. Reported on 07/22/2015  5  . Vitamin D, Ergocalciferol, (DRISDOL) 50000 units CAPS capsule Take 1 capsule (50,000 Units total) by mouth every 7 (seven) days. (Patient not taking: Reported on 04/06/2016) 4 capsule 4   No facility-administered medications prior to visit.     Allergies  Allergen Reactions  . Cefdinir     HIVES  . Celebrex [Celecoxib]     ABDOMINAL PAIN   . Ciprofloxacin     ABDOMINAL PAIN   . Codeine     NAUSEA VOMITING - can take with zofran  . Pseudoephedrine Hcl Er     TACHYCARDIA     Review of Systems  Constitutional: Positive for malaise/fatigue. Negative for  fever.  HENT: Negative for congestion.   Eyes: Negative for blurred vision.  Respiratory: Negative for cough and shortness of breath.   Cardiovascular: Negative for chest pain, palpitations and leg swelling.  Gastrointestinal: Negative for vomiting.  Musculoskeletal: Negative for back pain and joint pain.  Skin: Positive for itching and rash.  Neurological: Positive for  dizziness. Negative for loss of consciousness and headaches.       Objective:    Physical Exam  Constitutional: She is oriented to person, place, and time. She appears well-developed and well-nourished. No distress.  HENT:  Head: Normocephalic and atraumatic.  Eyes: Conjunctivae are normal.  Neck: Normal range of motion. No thyromegaly present.  Cardiovascular: Normal rate and regular rhythm.   Pulmonary/Chest: Effort normal and breath sounds normal. She has no wheezes.  Abdominal: Soft. Bowel sounds are normal. There is no tenderness.  Musculoskeletal: Normal range of motion. She exhibits no edema or deformity.  Neurological: She is alert and oriented to person, place, and time.  Skin: Skin is warm and dry. She is not diaphoretic.  Erythematous patch under left breast, slightly raised  Psychiatric: She has a normal mood and affect.    BP 132/82 (BP Location: Left Arm, Patient Position: Sitting, Cuff Size: Normal)   Pulse 67   Temp 97.9 F (36.6 C) (Oral)   Wt 168 lb 3.2 oz (76.3 kg)   SpO2 98%   BMI 31.78 kg/m  Wt Readings from Last 3 Encounters:  04/06/16 168 lb 3.2 oz (76.3 kg)  09/30/15 159 lb 6 oz (72.3 kg)  07/22/15 160 lb (72.6 kg)     Lab Results  Component Value Date   WBC 6.1 09/30/2015   HGB 11.0 (L) 09/30/2015   HCT 33.9 (L) 09/30/2015   PLT 268.0 09/30/2015   GLUCOSE 92 09/30/2015   CHOL 205 (H) 09/30/2015   TRIG 145.0 09/30/2015   HDL 64.40 09/30/2015   LDLCALC 112 (H) 09/30/2015   ALT 23 09/30/2015   AST 23 09/30/2015   NA 137 09/30/2015   K 4.0 09/30/2015   CL 104 09/30/2015     CREATININE 0.77 09/30/2015   BUN 15 09/30/2015   CO2 28 09/30/2015   TSH 0.78 09/30/2015   INR 1.19 02/07/2010    Lab Results  Component Value Date   TSH 0.78 09/30/2015   Lab Results  Component Value Date   WBC 6.1 09/30/2015   HGB 11.0 (L) 09/30/2015   HCT 33.9 (L) 09/30/2015   MCV 78.9 09/30/2015   PLT 268.0 09/30/2015   Lab Results  Component Value Date   NA 137 09/30/2015   K 4.0 09/30/2015   CO2 28 09/30/2015   GLUCOSE 92 09/30/2015   BUN 15 09/30/2015   CREATININE 0.77 09/30/2015   BILITOT 0.4 09/30/2015   ALKPHOS 57 09/30/2015   AST 23 09/30/2015   ALT 23 09/30/2015   PROT 7.2 09/30/2015   ALBUMIN 4.2 09/30/2015   CALCIUM 9.5 09/30/2015   GFR 79.96 09/30/2015   Lab Results  Component Value Date   CHOL 205 (H) 09/30/2015   Lab Results  Component Value Date   HDL 64.40 09/30/2015   Lab Results  Component Value Date   LDLCALC 112 (H) 09/30/2015   Lab Results  Component Value Date   TRIG 145.0 09/30/2015   Lab Results  Component Value Date   CHOLHDL 3 09/30/2015   No results found for: HGBA1C    I acted as a Education administrator for Dr. Charlett Blake. Princess, RMA  Assessment & Plan:   Problem List Items Addressed This Visit    HTN (hypertension) (Chronic)    Well controlled, no changes to meds. Encouraged heart healthy diet such as the DASH diet and exercise as tolerated.       Relevant Orders   CBC   Comprehensive metabolic panel   TSH  Gastroesophageal reflux disease with hiatal hernia    Avoid offending foods, start probiotics. Do not eat large meals in late evening and consider raising head of bed. Doing well on meds      Relevant Medications   meclizine (ANTIVERT) 25 MG tablet   Benign paroxysmal positional vertigo    Infrequent and mild, given refill on Meclizine to use prn and encouraged to stay well hydrated      Hip pain    Right still bothers her. Worse when lying down. Referred for physical therapy due to persistence of pain. Try  Lidocaine patches and stay active      Preventative health care    Patient is willing to have Hep C antibody checked today. ordered      Relevant Orders   Hepatitis C Antibody   Vocal cord polyp    Following with Dr Benjamine Mola and note reviewed. Request last note      Osteopenia    Encouraged to get adequate exercise, calcium and vitamin d intake      Vitamin D deficiency    Need a Vit D check today, continue a daily supplement      Relevant Orders   Vitamin D 1,25 dihydroxy   Candidal dermatitis    Given Nystatin to use prn and cleanse with warm soap and water and restart probiotics      Relevant Medications   nystatin ointment (MYCOSTATIN)    Other Visit Diagnoses    Right hip pain    -  Primary   Relevant Orders   Ambulatory referral to Physical Therapy      I am having Ms. Mini start on nystatin ointment. I am also having her maintain her fexofenadine, acetaminophen, benzocaine, Lidocaine-Hydrocortisone Ace, SIMPLY SALINE NA, ondansetron, MULTIVITAMIN ADULT, ranitidine, metoprolol succinate, esomeprazole, Vitamin D (Ergocalciferol), mometasone, and meclizine.  Meds ordered this encounter  Medications  . mometasone (NASONEX) 50 MCG/ACT nasal spray    Sig: Place 1-2 sprays into the nose daily as needed. Reported on 07/22/2015    Dispense:  17 g    Refill:  5  . meclizine (ANTIVERT) 25 MG tablet    Sig: Take 1 tablet (25 mg total) by mouth 3 (three) times daily as needed for dizziness.    Dispense:  30 tablet    Refill:  2  . nystatin ointment (MYCOSTATIN)    Sig: Apply 1 application topically 2 (two) times daily.    Dispense:  30 g    Refill:  1    CMA served as scribe during this visit. History, Physical and Plan performed by medical provider. Documentation and orders reviewed and attested to.  Penni Homans, MD

## 2016-04-06 NOTE — Assessment & Plan Note (Signed)
Well controlled, no changes to meds. Encouraged heart healthy diet such as the DASH diet and exercise as tolerated.  °

## 2016-04-06 NOTE — Assessment & Plan Note (Signed)
Avoid offending foods, start probiotics. Do not eat large meals in late evening and consider raising head of bed. Doing well on meds

## 2016-04-06 NOTE — Assessment & Plan Note (Signed)
Right still bothers her. Worse when lying down. Referred for physical therapy due to persistence of pain. Try Lidocaine patches and stay active

## 2016-04-06 NOTE — Assessment & Plan Note (Addendum)
Following with Dr Benjamine Mola and note reviewed. Request last note

## 2016-04-10 LAB — VITAMIN D 1,25 DIHYDROXY
VITAMIN D2 1, 25 (OH): 17 pg/mL
Vitamin D 1, 25 (OH)2 Total: 47 pg/mL (ref 18–72)
Vitamin D3 1, 25 (OH)2: 30 pg/mL

## 2016-04-13 ENCOUNTER — Ambulatory Visit: Payer: 59 | Attending: Family Medicine | Admitting: Physical Therapy

## 2016-04-13 DIAGNOSIS — M25551 Pain in right hip: Secondary | ICD-10-CM | POA: Insufficient documentation

## 2016-04-13 DIAGNOSIS — M6281 Muscle weakness (generalized): Secondary | ICD-10-CM | POA: Insufficient documentation

## 2016-04-13 NOTE — Patient Instructions (Addendum)
Hamstring Step 2    Left foot relaxed, knee straight, other leg bent, foot flat. Raise straight leg further upward to maximal range. Hold _30__ seconds. Relax leg completely down. Repeat __3_ times.  Copyright  VHI. All rights reserved.     Piriformis Stretch    Lying on back, pull right knee toward opposite shoulder. Hold _30___ seconds. Repeat __3__ times.      BACK: Hip Flexor Stretch    Interlace fingers on top of right knee. Shift weight forward. Continue breathing normally and hold position for _30__ breaths. Repeat on other leg. Alternate sides _3__ times.

## 2016-04-13 NOTE — Therapy (Signed)
[facility] [address]  [place], [place], [address] Phone: [phone]   Fax:  [phone]  Physical Therapy Evaluation  Patient Details  Name: Carrie Elliott MRN: [id number] Date of Birth: 1950-08-31 Referring Provider: Dr. Mosie Lukes  Encounter Date: 04/13/2016      PT End of Session - 04/13/16 0933    Visit Number 1   Number of Visits 12   Date for PT Re-Evaluation 05/25/16   PT Start Time 0840   PT Stop Time 0924   PT Time Calculation (min) 44 min   Activity Tolerance Patient tolerated treatment well   Behavior During Therapy [facility] for tasks assessed/performed      Past Medical History:  Diagnosis Date  . Allergic rhinitis   . Allergy   . Anemia 08/24/2014  . Arthritis    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, shoulders, knees  . Benign paroxysmal positional vertigo 07/12/2013  . Chicken pox   . Cystocele    grade 2  . Endometriosis    with Menometrorhaghia   . Gastric polyps 09/30/2015  . Gastroesophageal reflux disease with hiatal hernia    Cough Sees Dr Laretta Bolster   . GERD (gastroesophageal reflux disease)   . Hemorrhoids   . History of colonic polyps   . History of colonic polyps 09/30/2015   adenoma  . HTN (hypertension) 04/23/2013  . Hypocalcemia 09/30/2015  . IBS (irritable bowel syndrome)   . Measles   . Mumps   . Muscle spasm 09/30/2015  . Osteopenia 10/09/2015  . Rectocele    grade 2  . Reflux    cough  . Rosacea   . Sternal fracture    1986  . Sun-damaged skin 09/05/2014  . Vitamin D deficiency 04/06/2016  . 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 APPENDECTOMY  1989  . atypical nevus excision    . CHOLECYSTECTOMY  1989  . COLONOSCOPY  2011  . COLONOSCOPY WITH PROPOFOL N/A 05/27/2015   Procedure: COLONOSCOPY WITH PROPOFOL;  Surgeon: Clarene Essex, MD;  Location: [facility];  Service: Endoscopy;  Laterality: N/A;  . CYSTOSCOPY W/ URETERAL STENT REMOVAL  1999  .  ESOPHAGOGASTRODUODENOSCOPY  2011   with esophageal stent, perforation   . ESOPHAGOGASTRODUODENOSCOPY (EGD) WITH PROPOFOL N/A 05/27/2015   Procedure: ESOPHAGOGASTRODUODENOSCOPY (EGD) WITH PROPOFOL;  Surgeon: Clarene Essex, MD;  Location: [facility];  Service: Endoscopy;  Laterality: N/A;  . HEMORRHOID SURGERY  2005  . LEFT OOPHORECTOMY  [id number]   complicated by ureteral injuery requiring a ureteral stent   . MMK / ANTERIOR VESICOURETHROPEXY / URETHROPEXY  2000  . TONSILLECTOMY AND ADENOIDECTOMY    . TOTAL ABDOMINAL HYSTERECTOMY  2000  . TUBAL LIGATION  1981  . wisdom teeth removal  1964    There were no vitals filed for this visit.       Subjective Assessment - 04/13/16 0842    Subjective Patient reporting most pain at anterior and lateral hip. Feels like pain is exacerbated lying on back and R hip. 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 Patient reports pain last greater than 1 year. Has had x-rays with no diagnostic findings. Has had an injection in the past. Intermittent N&T in R foot and ankle - feels a "crampy" feeling in foot when lying supine. Denies bowel and bladder involvement   Patient is accompained by:  Family member  husband   Pertinent History no esophogeal sphincter; GI disturbance   Limitations Sitting;Standing;Walking   How long can you sit comfortably? positions herself so weight is not through R buttock   How long can you stand comfortably? requires weight shifting   How long can you walk comfortably? no issue   Diagnostic tests xray - no diagnostic findings   Patient Stated Goals reduce pain   Currently in Pain? Yes   Pain Score 4    Pain Location Hip   Pain Orientation Right   Pain Type Chronic pain   Pain Onset More than a month ago   Pain Frequency Constant   Aggravating Factors  sitting and lying down   Pain Relieving Factors moving            [facility] PT Assessment - 04/13/16 0839      Assessment    Medical Diagnosis R hip pain   Referring Provider Dr. Mosie Lukes   Onset Date/Surgical Date --  1 year   Next MD Visit prn   Prior Therapy no     Precautions   Precautions None     Restrictions   Weight Bearing Restrictions No     Balance Screen   Has the patient fallen in the past 6 months No   Has the patient had a decrease in activity level because of a fear of falling?  No   Is the patient reluctant to leave their home because of a fear of falling?  No     Home Environment   Living Environment Private residence   Living Arrangements Spouse/significant other   Type of [place] Two level;Able to live on main level with bedroom/bathroom   Alternate Level Stairs-Number of Steps 12     Prior Function   Level of Independence Independent   Vocation Full time employment   Vocation Requirements on feet most of her day     Cognition   Overall Cognitive Status Within Functional Limits for tasks assessed     Observation/Other Assessments   Focus on Therapeutic Outcomes (FOTO)  Hip: 98 (2% impaired, expected 15% impaired) - patient reporting diffiuclty answering as questions did not apply to her      Coordination   Gross Motor Movements are Fluid and Coordinated Yes     Posture/Postural Control   Posture/Postural Control Postural limitations   Postural Limitations Rounded Shoulders;Forward head     ROM / Strength   AROM / PROM / Strength AROM;Strength     AROM   Overall AROM  Within functional limits for tasks performed     Strength   Strength Assessment Site Hip;Knee;Ankle   Right/Left Hip Right;Left   Right Hip Flexion 3+/5   Right Hip Extension 3/5   Right Hip ABduction 3+/5   Left Hip Flexion 4-/5   Left Hip Extension 3+/5   Left Hip ABduction 4-/5   Right/Left Knee Right;Left   Right Knee Flexion 4-/5   Right Knee Extension 4-/5   Left Knee Flexion 4/5   Left Knee Extension 4/5   Right/Left Ankle Right;Left   Right  Ankle Dorsiflexion 5/5   Left Ankle Dorsiflexion 5/5     Flexibility   Soft Tissue Assessment /Muscle Length yes   Hamstrings R tightness   Quadriceps R tightness - 6" from buttock   Piriformis tightness     Palpation   Palpation comment some tenderness to palpation along R  anterior groin, R SI joint line     Special Tests    Special Tests Hip Special Tests   Hip Special Tests  Hip Scouring;Anterior Hip Impingement Test     Hip Scouring   Findings Negative   Side Right     Anterior Hip Impingement Test    Findings Negative   Side  Right                           PT Education - 04/13/16 0931    Education provided Yes   Education Details exam findings, POC, HEP   Person(s) Educated Patient   Methods Explanation;Demonstration;Handout   Comprehension Verbalized understanding;Returned demonstration             PT Long Term Goals - 04/13/16 [id number]      PT LONG TERM GOAL #1   Title patient to be independent with advanced HEP (05/25/16)   Status New     PT LONG TERM GOAL #2   Title Patient to improve R hip flexor/quad flexibility as demonstrated by foot 3" from buttock (05/25/16)   Status New     PT LONG TERM GOAL #3   Title Patient to report ability to lay supine and on R side with pain no greater than 2/10 for greater than 2 weeks (05/25/16)   Status New     PT LONG TERM GOAL #4   Title Patient to improve B LE strength to >/= 4/5 with no increase in pain (05/25/16)   Status New               Plan - 04/13/16 0934    Clinical Impression Statement Patient is a 66 y/o female presenting to [facility] today for low complexity evaluation regarding primary complaints of R hip pain with intermittent pain into R LE. Patient today with noted tightness throughout R piriformis, R hip flexor and R hamstring musculature. Patient with bilateral LE weakness with R LE demonstrating greater deficits as compared to L LE, as well as some tenderness to palpation along R  anterior groin and R SI joint line. Patient to benefit from skilled PT intervention to address the above listed deficits to imporve functional mobility.    Rehab Potential Good   PT Frequency 2x / week   PT Duration 6 weeks   PT Treatment/Interventions ADLs/Self Care Home Management;Cryotherapy;Electrical Stimulation;Iontophoresis 4mg /ml Dexamethasone;Moist Heat;Ultrasound;Neuromuscular re-education;Balance training;Therapeutic exercise;Therapeutic activities;Functional mobility training;Stair training;Patient/family education;Gait training;Manual techniques;Passive range of motion;Vasopneumatic Device;Taping;Dry needling   PT Next Visit Plan review stretching HEP; gentle strengthening per patient tolerance   Consulted and Agree with Plan of Care Patient      Patient will benefit from skilled therapeutic intervention in order to improve the following deficits and impairments:  Decreased activity tolerance, Decreased balance, Decreased range of motion, Decreased strength, Pain  Visit Diagnosis: Pain in right hip - Plan: PT plan of care cert/re-cert  Muscle weakness (generalized) - Plan: PT plan of care cert/re-cert     Problem List Patient Active Problem List   Diagnosis Date Noted  . Vitamin D deficiency 04/06/2016  . Candidal dermatitis 04/06/2016  . Osteopenia 10/09/2015  . Hypocalcemia 09/30/2015  . 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 History of colonic polyps 09/30/2015  . Gastric polyps 09/30/2015  . Hip pain 09/05/2014  . Urinary frequency 09/05/2014  . Preventative health care 09/05/2014  . Sun-damaged skin 09/05/2014  . Anemia 08/24/2014  . Cervical cancer screening 08/24/2014  . Myalgia and  myositis 02/11/2014  . Benign paroxysmal positional vertigo 07/12/2013  . History of chicken pox   . Chest pain 04/23/2013  . HTN (hypertension) 04/23/2013  . Headache(784.0) 04/23/2013  . Nodule of left lung 03/30/2011  . Allergic rhinitis   . Arthritis   . Gastroesophageal reflux  disease with hiatal hernia   . IBS (irritable bowel syndrome)      Lanney Gins, PT, DPT 04/13/16 9:55 AM   [facility] [address]  [place], [place], [address] Phone: [phone]   Fax:  [phone]  Name: Carrie Elliott MRN: [id number] Date of Birth: Aug 08, 1950

## 2016-04-18 DIAGNOSIS — J381 Polyp of vocal cord and larynx: Secondary | ICD-10-CM | POA: Diagnosis not present

## 2016-04-20 ENCOUNTER — Ambulatory Visit: Payer: 59 | Admitting: Physical Therapy

## 2016-04-20 ENCOUNTER — Encounter: Payer: Self-pay | Admitting: Family Medicine

## 2016-04-20 DIAGNOSIS — M6281 Muscle weakness (generalized): Secondary | ICD-10-CM

## 2016-04-20 DIAGNOSIS — M25551 Pain in right hip: Secondary | ICD-10-CM

## 2016-04-20 NOTE — Therapy (Signed)
[facility] [address]  [place], [place], [address] Phone: [phone]   Fax:  [phone]  Physical Therapy Treatment  Patient Details  Name: Carrie Elliott MRN: [id number] Date of Birth: Mar 08, 1951 Referring Provider: Dr. Mosie Lukes  Encounter Date: 04/20/2016      PT End of Session - 04/20/16 0807    Visit Number 2   Number of Visits 12   Date for PT Re-Evaluation 05/25/16   PT Start Time 0805   PT Stop Time 0850   PT Time Calculation (min) 45 min   Activity Tolerance Patient tolerated treatment well   Behavior During Therapy [facility] for tasks assessed/performed      Past Medical History:  Diagnosis Date  . Allergic rhinitis   . Allergy   . Anemia 08/24/2014  . Arthritis    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, shoulders, knees  . Benign paroxysmal positional vertigo 07/12/2013  . Chicken pox   . Cystocele    grade 2  . Endometriosis    with Menometrorhaghia   . Gastric polyps 09/30/2015  . Gastroesophageal reflux disease with hiatal hernia    Cough Sees Dr Laretta Bolster   . GERD (gastroesophageal reflux disease)   . Hemorrhoids   . History of colonic polyps   . History of colonic polyps 09/30/2015   adenoma  . HTN (hypertension) 04/23/2013  . Hypocalcemia 09/30/2015  . IBS (irritable bowel syndrome)   . Measles   . Mumps   . Muscle spasm 09/30/2015  . Osteopenia 10/09/2015  . Rectocele    grade 2  . Reflux    cough  . Rosacea   . Sternal fracture    1986  . Sun-damaged skin 09/05/2014  . Vitamin D deficiency 04/06/2016  . 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 APPENDECTOMY  1989  . atypical nevus excision    . CHOLECYSTECTOMY  1989  . COLONOSCOPY  2011  . COLONOSCOPY WITH PROPOFOL N/A 05/27/2015   Procedure: COLONOSCOPY WITH PROPOFOL;  Surgeon: Clarene Essex, MD;  Location: [facility];  Service: Endoscopy;  Laterality: N/A;  . CYSTOSCOPY W/ URETERAL STENT REMOVAL  1999  .  ESOPHAGOGASTRODUODENOSCOPY  2011   with esophageal stent, perforation   . ESOPHAGOGASTRODUODENOSCOPY (EGD) WITH PROPOFOL N/A 05/27/2015   Procedure: ESOPHAGOGASTRODUODENOSCOPY (EGD) WITH PROPOFOL;  Surgeon: Clarene Essex, MD;  Location: [facility];  Service: Endoscopy;  Laterality: N/A;  . HEMORRHOID SURGERY  2005  . LEFT OOPHORECTOMY  [id number]   complicated by ureteral injuery requiring a ureteral stent   . MMK / ANTERIOR VESICOURETHROPEXY / URETHROPEXY  2000  . TONSILLECTOMY AND ADENOIDECTOMY    . TOTAL ABDOMINAL HYSTERECTOMY  2000  . TUBAL LIGATION  1981  . wisdom teeth removal  1964    There were no vitals filed for this visit.      Subjective Assessment - 04/20/16 0806    Subjective Feels about the same - did HEP "some" - climbed a lot of stairs with visiting grandchildren   Diagnostic tests xray - no diagnostic findings   Patient Stated Goals reduce pain   Currently in Pain? Yes   Pain Score 5    Pain Location Hip   Pain Orientation Right   Pain Descriptors / Indicators Aching;Sore   Pain Type Chronic pain   Pain Onset More than a month ago   Pain Frequency Constant   Aggravating Factors  sitting and lying down  Pain Relieving Factors moving                         [facility] Adult PT Treatment/Exercise - 04/20/16 0001      Exercises   Exercises Knee/Hip     Knee/Hip Exercises: Stretches   Passive Hamstring Stretch Right;3 reps;30 seconds   Passive Hamstring Stretch Limitations supine with strap   Quad Stretch Right;3 reps;30 seconds   Quad Stretch Limitations 1/2 kneeling on foam   Piriformis Stretch 1 rep;30 seconds     Knee/Hip Exercises: Aerobic   Stationary Bike L2 x 6 minutes     Knee/Hip Exercises: Seated   Sit to [employer] 10 reps  4# - seated on box with foam     Knee/Hip Exercises: Supine   Bridges Strengthening;Both;10 reps   Bridges Limitations 5 second hold   Bridges with [employer] Strengthening;Both;10 reps   Other Supine Knee/Hip  Exercises Ab set in hooklying - 10 x 5" hold     Knee/Hip Exercises: Sidelying   Clams R LE 10 x 2 - green tband                PT Education - 04/20/16 0907    Education provided Yes   Education Details updated HEP, issued heel lift for leg length discrepancy   Person(s) Educated Patient   Methods Explanation;Demonstration;Handout   Comprehension Verbalized understanding;Returned demonstration             PT Long Term Goals - 04/20/16 0909      PT LONG TERM GOAL #1   Title patient to be independent with advanced HEP (05/25/16)   Status On-going     PT LONG TERM GOAL #2   Title Patient to improve R hip flexor/quad flexibility as demonstrated by foot 3" from buttock (05/25/16)   Status On-going     PT LONG TERM GOAL #3   Title Patient to report ability to lay supine and on R side with pain no greater than 2/10 for greater than 2 weeks (05/25/16)   Status On-going     PT LONG TERM GOAL #4   Title Patient to improve B LE strength to >/= 4/5 with no increase in pain (05/25/16)   Status On-going               Plan - 04/20/16 0808    Clinical Impression Statement Laicee doing well today - continues to have symptoms in R hip and some in R knee with descending stairs. Patient tolerating all hip/knee/core exercises today with no increase in pain, with only appropriate muscle soreness following each task. Patient with some reduced knee pain with PT applying manual medial glide of patella with stand to sit activity. PT issued heel lift for L LE as patient demonstrates true leg length discrepency, which may be contributing to hip pain. Patient to continue to benefit from PT to maximize function.    PT Treatment/Interventions ADLs/Self Care Home Management;Cryotherapy;Electrical Stimulation;Iontophoresis 4mg /ml Dexamethasone;Moist Heat;Ultrasound;Neuromuscular re-education;Balance training;Therapeutic exercise;Therapeutic activities;Functional mobility training;Stair  training;Patient/family education;Gait training;Manual techniques;Passive range of motion;Vasopneumatic Device;Taping;Dry needling   PT Next Visit Plan gentle strengthening per patient tolerance   Consulted and Agree with Plan of Care Patient      Patient will benefit from skilled therapeutic intervention in order to improve the following deficits and impairments:  Decreased activity tolerance, Decreased balance, Decreased range of motion, Decreased strength, Pain  Visit Diagnosis: Pain in right hip  Muscle weakness (generalized)  Problem List Patient Active Problem List   Diagnosis Date Noted  . Vitamin D deficiency 04/06/2016  . Candidal dermatitis 04/06/2016  . Osteopenia 10/09/2015  . Hypocalcemia 09/30/2015  . 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 History of colonic polyps 09/30/2015  . Gastric polyps 09/30/2015  . Hip pain 09/05/2014  . Urinary frequency 09/05/2014  . Preventative health care 09/05/2014  . Sun-damaged skin 09/05/2014  . Anemia 08/24/2014  . Cervical cancer screening 08/24/2014  . Myalgia and myositis 02/11/2014  . Benign paroxysmal positional vertigo 07/12/2013  . History of chicken pox   . Chest pain 04/23/2013  . HTN (hypertension) 04/23/2013  . Headache(784.0) 04/23/2013  . Nodule of left lung 03/30/2011  . Allergic rhinitis   . Arthritis   . Gastroesophageal reflux disease with hiatal hernia   . IBS (irritable bowel syndrome)     Lanney Gins, PT, DPT 04/20/16 9:18 AM   [facility] [address]  [place], [place], [address] Phone: [phone]   Fax:  [phone]  Name: NEHA WALDRIDGE MRN: [id number] Date of Birth: [birth year]

## 2016-04-20 NOTE — Patient Instructions (Signed)
Bridge    Lie back, legs bent. Inhale, pressing hips up. Keeping ribs in, lengthen lower back. Exhale, rolling down along spine from top. Repeat __15__ times. Do _2___ sessions per day.   Clam Shell 45 Degrees    Lying with hips and knees bent 45, one pillow between knees and ankles. Lift knee. Be sure pelvis does not roll backward. Do not arch back. Do __15_ times, each leg, _2__ times per day. Band behind knee    Pelvic Tilt: Posterior - Legs Bent (Supine)    Tighten stomach and flatten back by rolling pelvis down. Hold _5-10___ seconds. Relax. Repeat __15__ times per set. Do __2__ sets per session.

## 2016-04-27 ENCOUNTER — Ambulatory Visit: Payer: 59 | Attending: Family Medicine | Admitting: Physical Therapy

## 2016-04-27 DIAGNOSIS — M25551 Pain in right hip: Secondary | ICD-10-CM | POA: Diagnosis not present

## 2016-04-27 DIAGNOSIS — M6281 Muscle weakness (generalized): Secondary | ICD-10-CM | POA: Insufficient documentation

## 2016-04-27 NOTE — Therapy (Signed)
[facility] [address]  [place], [place], [address] Phone: [phone]   Fax:  [phone]  Physical Therapy Treatment  Patient Details  Name: Carrie Elliott MRN: [id number] Date of Birth: 1951/03/03 Referring Provider: Dr. Mosie Lukes  Encounter Date: 04/27/2016      PT End of Session - 04/27/16 0804    Visit Number 3   Number of Visits 12   Date for PT Re-Evaluation 05/25/16   PT Start Time 0800   PT Stop Time 0845   PT Time Calculation (min) 45 min   Activity Tolerance Patient tolerated treatment well   Behavior During Therapy [facility] for tasks assessed/performed      Past Medical History:  Diagnosis Date  . Allergic rhinitis   . Allergy   . Anemia 08/24/2014  . Arthritis    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, shoulders, knees  . Benign paroxysmal positional vertigo 07/12/2013  . Chicken pox   . Cystocele    grade 2  . Endometriosis    with Menometrorhaghia   . Gastric polyps 09/30/2015  . Gastroesophageal reflux disease with hiatal hernia    Cough Sees Dr Laretta Bolster   . GERD (gastroesophageal reflux disease)   . Hemorrhoids   . History of colonic polyps   . History of colonic polyps 09/30/2015   adenoma  . HTN (hypertension) 04/23/2013  . Hypocalcemia 09/30/2015  . IBS (irritable bowel syndrome)   . Measles   . Mumps   . Muscle spasm 09/30/2015  . Osteopenia 10/09/2015  . Rectocele    grade 2  . Reflux    cough  . Rosacea   . Sternal fracture    1986  . Sun-damaged skin 09/05/2014  . Vitamin D deficiency 04/06/2016  . 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 APPENDECTOMY  1989  . atypical nevus excision    . CHOLECYSTECTOMY  1989  . COLONOSCOPY  2011  . COLONOSCOPY WITH PROPOFOL N/A 05/27/2015   Procedure: COLONOSCOPY WITH PROPOFOL;  Surgeon: Clarene Essex, MD;  Location: [facility];  Service: Endoscopy;  Laterality: N/A;  . CYSTOSCOPY W/ URETERAL STENT REMOVAL  1999  .  ESOPHAGOGASTRODUODENOSCOPY  2011   with esophageal stent, perforation   . ESOPHAGOGASTRODUODENOSCOPY (EGD) WITH PROPOFOL N/A 05/27/2015   Procedure: ESOPHAGOGASTRODUODENOSCOPY (EGD) WITH PROPOFOL;  Surgeon: Clarene Essex, MD;  Location: [facility];  Service: Endoscopy;  Laterality: N/A;  . HEMORRHOID SURGERY  2005  . LEFT OOPHORECTOMY  [id number]   complicated by ureteral injuery requiring a ureteral stent   . MMK / ANTERIOR VESICOURETHROPEXY / URETHROPEXY  2000  . TONSILLECTOMY AND ADENOIDECTOMY    . TOTAL ABDOMINAL HYSTERECTOMY  2000  . TUBAL LIGATION  1981  . wisdom teeth removal  1964    There were no vitals filed for this visit.      Subjective Assessment - 04/27/16 0803    Subjective Doesn't know if heel lift is making a difference.    Patient is accompained by: Family member   Pertinent History no esophogeal sphincter; GI disturbance   Diagnostic tests xray - no diagnostic findings   Patient Stated Goals reduce pain   Currently in Pain? Yes   Pain Score 4    Pain Location Hip   Pain Orientation Right   Pain Descriptors / Indicators Aching;Sore   Pain Type Chronic pain   Pain Onset More than a month ago   Pain Frequency Constant  Aggravating Factors  sitting and lying down   Pain Relieving Factors moving                         [facility] Adult PT Treatment/Exercise - 04/27/16 0805      Knee/Hip Exercises: Stretches   Passive Hamstring Stretch Right;3 reps;30 seconds   Passive Hamstring Stretch Limitations supine with strap  education for seated hamstring stretch   Piriformis Stretch 1 rep;30 seconds   Piriformis Stretch Limitations education on seated figure 4 hamstring stretch     Knee/Hip Exercises: Aerobic   Stationary Bike L2 x 6 minutes     Knee/Hip Exercises: Standing   Hip Abduction Stengthening;Right;15 reps;Knee straight   Hip Extension Stengthening;Right;15 reps;Knee straight     Knee/Hip Exercises: Supine   Bridges Strengthening;Both;15 reps    Bridges Limitations 5 second hold - green tband at knees   Other Supine Knee/Hip Exercises core engagement - R LE fallout with green tband - 15 reps     Knee/Hip Exercises: Sidelying   Hip ABduction Strengthening;Right;15 reps   Clams R LE 15 x 2 - green tband     Modalities   Modalities Iontophoresis     Iontophoresis   Type of Iontophoresis Dexamethasone   Location R hip   Dose 1.0 mL   Time 4-6 hours; 80 mA                PT Education - 04/27/16 0850    Education provided Yes   Education Details seated stretches, ionto education   Person(s) Educated Patient   Methods Explanation;Demonstration;Handout   Comprehension Verbalized understanding;Returned demonstration             PT Long Term Goals - 04/20/16 0909      PT LONG TERM GOAL #1   Title patient to be independent with advanced HEP (05/25/16)   Status On-going     PT LONG TERM GOAL #2   Title Patient to improve R hip flexor/quad flexibility as demonstrated by foot 3" from buttock (05/25/16)   Status On-going     PT LONG TERM GOAL #3   Title Patient to report ability to lay supine and on R side with pain no greater than 2/10 for greater than 2 weeks (05/25/16)   Status On-going     PT LONG TERM GOAL #4   Title Patient to improve B LE strength to >/= 4/5 with no increase in pain (05/25/16)   Status On-going               Plan - 04/27/16 0804    Clinical Impression Statement Patient doing well today - continues reports of pain with sleeping on side as well as sleeping in general. Patient today with some tenderness to palpation over greater trochanter, thus ionto initiated today for R hip pain. 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 Patient tolerating all progression today with ther ex with no increase in pain. Will continue to benefit from PT to maximize function.     PT Treatment/Interventions ADLs/Self Care Home Management;Cryotherapy;Electrical Stimulation;Iontophoresis 4mg /ml Dexamethasone;Moist Heat;Ultrasound;Neuromuscular re-education;Balance training;Therapeutic exercise;Therapeutic activities;Functional mobility training;Stair training;Patient/family education;Gait training;Manual techniques;Passive range of motion;Vasopneumatic Device;Taping;Dry needling   PT Next Visit Plan gentle strengthening per patient tolerance   Consulted and Agree with Plan of Care Patient      Patient will benefit from skilled  therapeutic intervention in order to improve the following deficits and impairments:  Decreased activity tolerance, Decreased balance, Decreased range of motion, Decreased strength, Pain  Visit Diagnosis: Pain in right hip  Muscle weakness (generalized)     Problem List Patient Active Problem List   Diagnosis Date Noted  . Vitamin D deficiency 04/06/2016  . Candidal dermatitis 04/06/2016  . Osteopenia 10/09/2015  . Hypocalcemia 09/30/2015  . 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 History of colonic polyps 09/30/2015  . Gastric polyps 09/30/2015  . Hip pain 09/05/2014  . Urinary frequency 09/05/2014  . Preventative health care 09/05/2014  . Sun-damaged skin 09/05/2014  . Anemia 08/24/2014  . Cervical cancer screening 08/24/2014  . Myalgia and myositis 02/11/2014  . Benign paroxysmal positional vertigo 07/12/2013  . History of chicken pox   . Chest pain 04/23/2013  . HTN (hypertension) 04/23/2013  . Headache(784.0) 04/23/2013  . Nodule of left lung 03/30/2011  . Allergic rhinitis   . Arthritis   . Gastroesophageal reflux disease with hiatal hernia   . IBS (irritable bowel syndrome)     Lanney Gins, PT, DPT 04/27/16 8:55 AM   [facility] [address]  [place], [place], [address] Phone: [phone]   Fax:  [phone]  Name: Carrie Elliott MRN:  [id number] Date of Birth: 02/19/1951

## 2016-04-27 NOTE — Patient Instructions (Addendum)
  Seated Hamstring Strretch   R leg out straight - 30 seconds x 3 reps   Seated Piriformis stretch   R leg over left - gentle bend forward - 30 seconds x 3 reps   HIP: Abduction - Side-Lying    Lie on side, legs straight and in line with trunk. Squeeze glutes. Raise top leg up and slightly back. Point toes forward. _15__ reps per set, _2__ sets per day. Bend bottom leg to stabilize pelvis.    IONTOPHORESIS PATIENT PRECAUTIONS & CONTRAINDICATIONS:  . Redness under one or both electrodes can occur.  This characterized by a uniform redness that usually disappears within 12 hours of treatment. . Small pinhead size blisters may result in response to the drug.  Contact your physician if the problem persists more than 24 hours. . On rare occasions, iontophoresis therapy can result in temporary skin reactions such as rash, inflammation, irritation or burns.  The skin reactions may be the result of individual sensitivity to the ionic solution used, the condition of the skin at the start of treatment, reaction to the materials in the electrodes, allergies or sensitivity to dexamethasone, or a poor connection between the patch and your skin.  Discontinue using iontophoresis if you have any of these reactions and report to your therapist. . Remove the Patch or electrodes if you have any undue sensation of pain or burning during the treatment and report discomfort to your therapist. . Tell your Therapist if you have had known adverse reactions to the application of electrical current. . If using the Patch, the LED light will turn off when treatment is complete and the patch can be removed.  Approximate treatment time is 1-3 hours.  Remove the patch when light goes off or after 6 hours. . The Patch can be worn during normal activity, however excessive motion where the electrodes have been placed can cause poor contact between the skin and the electrode or uneven electrical current resulting in  greater risk of skin irritation. Marland Kitchen Keep out of the reach of children.   . DO NOT use if you have a cardiac pacemaker or any other electrically sensitive implanted device. . DO NOT use if you have a known sensitivity to dexamethasone. . DO NOT use during Magnetic Resonance Imaging (MRI). . DO NOT use over broken or compromised skin (e.g. sunburn, cuts, or acne) due to the increased risk of skin reaction. . DO NOT SHAVE over the area to be treated:  To establish good contact between the Patch and the skin, excessive hair may be clipped. . DO NOT place the Patch or electrodes on or over your eyes, directly over your heart, or brain. . DO NOT reuse the Patch or electrodes as this may cause burns to occur.

## 2016-05-04 ENCOUNTER — Ambulatory Visit: Payer: 59 | Admitting: Physical Therapy

## 2016-05-04 DIAGNOSIS — M25551 Pain in right hip: Secondary | ICD-10-CM

## 2016-05-04 DIAGNOSIS — M6281 Muscle weakness (generalized): Secondary | ICD-10-CM

## 2016-05-04 MED FILL — METOPROLOL SUCC ER 50 MG TA: 50 | 90 days supply | Qty: 90 | Fill #2

## 2016-05-04 NOTE — Patient Instructions (Signed)
Lunge    Place left leg forward. Bend both knees, keeping head and back straight. May hold ____ pound weight in each hand.  Repeat __15__ times. Do __2__ sessions per day. CAUTION: Move slowly. May lightly hold chair for stability.    Squat: Half    Arms hanging at sides, squat by dropping hips back as if sitting on a chair. Keep knees over feet. Repeat __15__ times per set. Do _2___ sets per session.

## 2016-05-04 NOTE — Therapy (Signed)
[facility] [address]  [place], [place], [address] Phone: [phone]   Fax:  [phone]  Physical Therapy Treatment  Patient Details  Name: Carrie Elliott MRN: [id number] Date of Birth: 1950-08-15 Referring Provider: Dr. Mosie Elliott  Encounter Date: 05/04/2016      PT End of Session - 05/04/16 0813    Visit Number 4   Number of Visits 12   Date for PT Re-Evaluation 05/25/16   PT Start Time 0800   PT Stop Time 0846   PT Time Calculation (min) 46 min   Activity Tolerance Patient tolerated treatment well   Behavior During Therapy [facility] for tasks assessed/performed      Past Medical History:  Diagnosis Date  . Allergic rhinitis   . Allergy   . Anemia 08/24/2014  . Arthritis    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, shoulders, knees  . Benign paroxysmal positional vertigo 07/12/2013  . Chicken pox   . Cystocele    grade 2  . Endometriosis    with Menometrorhaghia   . Gastric polyps 09/30/2015  . Gastroesophageal reflux disease with hiatal hernia    Cough Sees Dr Carrie Elliott   . GERD (gastroesophageal reflux disease)   . Hemorrhoids   . History of colonic polyps   . History of colonic polyps 09/30/2015   adenoma  . HTN (hypertension) 04/23/2013  . Hypocalcemia 09/30/2015  . IBS (irritable bowel syndrome)   . Measles   . Mumps   . Muscle spasm 09/30/2015  . Osteopenia 10/09/2015  . Rectocele    grade 2  . Reflux    cough  . Rosacea   . Sternal fracture    1986  . Sun-damaged skin 09/05/2014  . Vitamin D deficiency 04/06/2016  . 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 APPENDECTOMY  1989  . atypical nevus excision    . CHOLECYSTECTOMY  1989  . COLONOSCOPY  2011  . COLONOSCOPY WITH PROPOFOL N/A 05/27/2015   Procedure: COLONOSCOPY WITH PROPOFOL;  Surgeon: Carrie Essex, MD;  Location: [facility];  Service: Endoscopy;  Laterality: N/A;  . CYSTOSCOPY W/ URETERAL STENT REMOVAL  1999  .  ESOPHAGOGASTRODUODENOSCOPY  2011   with esophageal stent, perforation   . ESOPHAGOGASTRODUODENOSCOPY (EGD) WITH PROPOFOL N/A 05/27/2015   Procedure: ESOPHAGOGASTRODUODENOSCOPY (EGD) WITH PROPOFOL;  Surgeon: Carrie Essex, MD;  Location: [facility];  Service: Endoscopy;  Laterality: N/A;  . HEMORRHOID SURGERY  2005  . LEFT OOPHORECTOMY  [id number]   complicated by ureteral injuery requiring a ureteral stent   . MMK / ANTERIOR VESICOURETHROPEXY / URETHROPEXY  2000  . TONSILLECTOMY AND ADENOIDECTOMY    . TOTAL ABDOMINAL HYSTERECTOMY  2000  . TUBAL LIGATION  1981  . wisdom teeth removal  1964    There were no vitals filed for this visit.      Subjective Assessment - 05/04/16 0758    Subjective pain little bit worse - was doing a lot of lifting at work yesterday   Patient is accompained by: Family member   Pertinent History no esophogeal sphincter; GI disturbance   Diagnostic tests xray - no diagnostic findings   Patient Stated Goals reduce pain   Currently in Pain? Yes   Pain Score 7    Pain Location Hip   Pain Orientation Right   Pain Descriptors / Indicators Aching;Tightness;Sore   Pain Type Chronic pain   Pain Onset More than a month ago  Pain Frequency Constant   Aggravating Factors  sitting and lying down   Pain Relieving Factors moving                         [facility] Adult PT Treatment/Exercise - 05/04/16 0814      Knee/Hip Exercises: Stretches   Passive Hamstring Stretch Right;3 reps;30 seconds   Passive Hamstring Stretch Limitations seatedseated with R LE extended   Other Knee/Hip Stretches seated self massage to proximal hamstrings x 2 minutes     Knee/Hip Exercises: Aerobic   Nustep level 4 x 6 minutes     Knee/Hip Exercises: Standing   Forward Lunges Left;15 reps   Hip Abduction Stengthening;Right;15 reps;Knee straight   Abduction Limitations 2#   Hip Extension Stengthening;Right;15 reps;Knee straight   Extension Limitations 2#   Functional Squat 15  reps   Other Standing Knee Exercises standing fitter - hip extension - 2 blue bands x 15 reps     Knee/Hip Exercises: Supine   Bridges Strengthening;Both;15 reps   Bridges Limitations B LE extended on peanut ball     Knee/Hip Exercises: Sidelying   Hip ABduction Strengthening;Right;15 reps   Clams HEP review - good form     Modalities   Modalities Iontophoresis     Iontophoresis   Type of Iontophoresis Dexamethasone   Location R hip   Dose 1.0 mL   Time 4-6 hours; 80 mA                     PT Long Term Goals - 04/20/16 [id number]      PT LONG TERM GOAL #1   Title patient to be independent with advanced HEP (05/25/16)   Status On-going     PT LONG TERM GOAL #2   Title Patient to improve R hip flexor/quad flexibility as demonstrated by foot 3" from buttock (05/25/16)   Status On-going     PT LONG TERM GOAL #3   Title Patient to report ability to lay supine and on R side with pain no greater than 2/10 for greater than 2 weeks (05/25/16)   Status On-going     PT LONG TERM GOAL #4   Title Patient to improve B LE strength to >/= 4/5 with no increase in pain (05/25/16)   Status On-going               Plan - 05/04/16 0813    Clinical Impression Statement Carrie Elliott doing well - some subjective reports of increased hip pain of which she relates to increased lifting activities at work yesterday. Patient today able to pinpoint pain at proximal hamstring insertion today. PT instructing patient on self-massage seated with small ball for pain management and prevention. Patient doing well with all hip strengthening activities with appropriate muscle soreness and fatigue. Patient to continue to benefit from PT to maximize function   PT Treatment/Interventions ADLs/Self Care Home Management;Cryotherapy;Electrical Stimulation;Iontophoresis 4mg /ml Dexamethasone;Moist Heat;Ultrasound;Neuromuscular re-education;Balance training;Therapeutic exercise;Therapeutic activities;Functional mobility  training;Stair training;Patient/family education;Gait training;Manual techniques;Passive range of motion;Vasopneumatic Device;Taping;Dry needling   PT Next Visit Plan gentle strengthening per patient tolerance   Consulted and Agree with Plan of Care Patient      Patient will benefit from skilled therapeutic intervention in order to improve the following deficits and impairments:  Decreased activity tolerance, Decreased balance, Decreased range of motion, Decreased strength, Pain  Visit Diagnosis: Pain in right hip  Muscle weakness (generalized)     Problem List Patient Active Problem List  Diagnosis Date Noted  . Vitamin D deficiency 04/06/2016  . Candidal dermatitis 04/06/2016  . Osteopenia 10/09/2015  . Hypocalcemia 09/30/2015  . 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 History of colonic polyps 09/30/2015  . Gastric polyps 09/30/2015  . Hip pain 09/05/2014  . Urinary frequency 09/05/2014  . Preventative health care 09/05/2014  . Sun-damaged skin 09/05/2014  . Anemia 08/24/2014  . Cervical cancer screening 08/24/2014  . Myalgia and myositis 02/11/2014  . Benign paroxysmal positional vertigo 07/12/2013  . History of chicken pox   . Chest pain 04/23/2013  . HTN (hypertension) 04/23/2013  . Headache(784.0) 04/23/2013  . Nodule of left lung 03/30/2011  . Allergic rhinitis   . Arthritis   . Gastroesophageal reflux disease with hiatal hernia   . IBS (irritable bowel syndrome)     Lanney Gins, PT, DPT 05/04/16 9:13 AM   [facility] [address]  [place], [place], [address] Phone: [phone]   Fax:  [phone]  Name: Carrie Elliott MRN: [id number] Date of Birth: November 10, 1950

## 2016-05-11 ENCOUNTER — Ambulatory Visit: Payer: 59 | Admitting: Physical Therapy

## 2016-05-11 DIAGNOSIS — M6281 Muscle weakness (generalized): Secondary | ICD-10-CM | POA: Diagnosis not present

## 2016-05-11 DIAGNOSIS — M25551 Pain in right hip: Secondary | ICD-10-CM

## 2016-05-11 NOTE — Therapy (Signed)
[facility] [address]  [place], [place], [address] Phone: [phone]   Fax:  [phone]  Physical Therapy Treatment  Patient Details  Name: Carrie Elliott MRN: [id number] Date of Birth: 1950-12-08 Referring Provider: Dr. Mosie Lukes  Encounter Date: 05/11/2016      PT End of Session - 05/11/16 0802    Visit Number 5   Number of Visits 12   Date for PT Re-Evaluation 05/25/16   PT Start Time 0800   Activity Tolerance Patient tolerated treatment well   Behavior During Therapy [facility] for tasks assessed/performed      Past Medical History:  Diagnosis Date  . Allergic rhinitis   . Allergy   . Anemia 08/24/2014  . Arthritis    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, shoulders, knees  . Benign paroxysmal positional vertigo 07/12/2013  . Chicken pox   . Cystocele    grade 2  . Endometriosis    with Menometrorhaghia   . Gastric polyps 09/30/2015  . Gastroesophageal reflux disease with hiatal hernia    Cough Sees Dr Laretta Bolster   . GERD (gastroesophageal reflux disease)   . Hemorrhoids   . History of colonic polyps   . History of colonic polyps 09/30/2015   adenoma  . HTN (hypertension) 04/23/2013  . Hypocalcemia 09/30/2015  . IBS (irritable bowel syndrome)   . Measles   . Mumps   . Muscle spasm 09/30/2015  . Osteopenia 10/09/2015  . Rectocele    grade 2  . Reflux    cough  . Rosacea   . Sternal fracture    1986  . Sun-damaged skin 09/05/2014  . Vitamin D deficiency 04/06/2016  . 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 APPENDECTOMY  1989  . atypical nevus excision    . CHOLECYSTECTOMY  1989  . COLONOSCOPY  2011  . COLONOSCOPY WITH PROPOFOL N/A 05/27/2015   Procedure: COLONOSCOPY WITH PROPOFOL;  Surgeon: Clarene Essex, MD;  Location: [facility];  Service: Endoscopy;  Laterality: N/A;  . CYSTOSCOPY W/ URETERAL STENT REMOVAL  1999  . ESOPHAGOGASTRODUODENOSCOPY  2011   with esophageal stent,  perforation   . ESOPHAGOGASTRODUODENOSCOPY (EGD) WITH PROPOFOL N/A 05/27/2015   Procedure: ESOPHAGOGASTRODUODENOSCOPY (EGD) WITH PROPOFOL;  Surgeon: Clarene Essex, MD;  Location: [facility];  Service: Endoscopy;  Laterality: N/A;  . HEMORRHOID SURGERY  2005  . LEFT OOPHORECTOMY  [id number]   complicated by ureteral injuery requiring a ureteral stent   . MMK / ANTERIOR VESICOURETHROPEXY / URETHROPEXY  2000  . TONSILLECTOMY AND ADENOIDECTOMY    . TOTAL ABDOMINAL HYSTERECTOMY  2000  . TUBAL LIGATION  1981  . wisdom teeth removal  1964    There were no vitals filed for this visit.      Subjective Assessment - 05/11/16 0802    Subjective patient feeling well   Patient is accompained by: Family member   Pertinent History no esophogeal sphincter; GI disturbance   Diagnostic tests xray - no diagnostic findings   Patient Stated Goals reduce pain   Currently in Pain? Yes   Pain Score 4    Pain Location Hip   Pain Orientation Right   Pain Descriptors / Indicators Sore;Aching;Tightness   Pain Type Chronic pain   Pain Onset More than a month ago   Pain Frequency Constant   Aggravating Factors  sitting and lying down   Pain Relieving Factors moving  [facility] Adult PT Treatment/Exercise - 05/11/16 0803      Knee/Hip Exercises: Stretches   Passive Hamstring Stretch Right;3 reps;30 seconds   Passive Hamstring Stretch Limitations seated with R LE extended     Knee/Hip Exercises: Aerobic   Stationary Bike L3 x 6 minutes     Knee/Hip Exercises: Standing   Forward Lunges Both;10 reps   Side Lunges Limitations side stepping 20 feet x 2 each way with green tband at ankles   Functional Squat 15 reps   Other Standing Knee Exercises backward monster walks - 20 feet x 2 with green tband at ankles     Knee/Hip Exercises: Seated   Other Seated Knee/Hip Exercises sciatic nerve glide - R LE x 8 reps     Knee/Hip Exercises: Supine   Bridges Strengthening;Both;15 reps    Bridges Limitations B LE extended on peanut ball   Other Supine Knee/Hip Exercises Bridge with hamstring curl on peanut ball x 15 reps     Knee/Hip Exercises: Sidelying   Hip ABduction Strengthening;Right;15 reps   Hip ABduction Limitations 2#     Modalities   Modalities Iontophoresis     Iontophoresis   Type of Iontophoresis Dexamethasone   Location R hip   Dose 1.0 mL   Time 4-6 hours; 80 mA                     PT Long Term Goals - 04/20/16 0909      PT LONG TERM GOAL #1   Title patient to be independent with advanced HEP (05/25/16)   Status On-going     PT LONG TERM GOAL #2   Title Patient to improve R hip flexor/quad flexibility as demonstrated by foot 3" from buttock (05/25/16)   Status On-going     PT LONG TERM GOAL #3   Title Patient to report ability to lay supine and on R side with pain no greater than 2/10 for greater than 2 weeks (05/25/16)   Status On-going     PT LONG TERM GOAL #4   Title Patient to improve B LE strength to >/= 4/5 with no increase in pain (05/25/16)   Status On-going               Plan - 05/11/16 0802    Clinical Impression Statement patient doing well today - much lower pain today as compared to last week. Patient very complaint with all exercises and stretching. PT today incorporating more standing strengthening tasks with PT inclusion of sciatic nerve glide due to patient reports of pain with prolonged sitting and driving. Patient to continue to benefit from PT to maximize function and mobility.    PT Treatment/Interventions ADLs/Self Care Home Management;Cryotherapy;Electrical Stimulation;Iontophoresis 4mg /ml Dexamethasone;Moist Heat;Ultrasound;Neuromuscular re-education;Balance training;Therapeutic exercise;Therapeutic activities;Functional mobility training;Stair training;Patient/family education;Gait training;Manual techniques;Passive range of motion;Vasopneumatic Device;Taping;Dry needling   PT Next Visit Plan gentle  strengthening per patient tolerance   Consulted and Agree with Plan of Care Patient      Patient will benefit from skilled therapeutic intervention in order to improve the following deficits and impairments:  Decreased activity tolerance, Decreased balance, Decreased range of motion, Decreased strength, Pain  Visit Diagnosis: Pain in right hip  Muscle weakness (generalized)     Problem List Patient Active Problem List   Diagnosis Date Noted  . Vitamin D deficiency 04/06/2016  . Candidal dermatitis 04/06/2016  . Osteopenia 10/09/2015  . Hypocalcemia 09/30/2015  . 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 History of colonic polyps 09/30/2015  .  Gastric polyps 09/30/2015  . Hip pain 09/05/2014  . Urinary frequency 09/05/2014  . Preventative health care 09/05/2014  . Sun-damaged skin 09/05/2014  . Anemia 08/24/2014  . Cervical cancer screening 08/24/2014  . Myalgia and myositis 02/11/2014  . Benign paroxysmal positional vertigo 07/12/2013  . History of chicken pox   . Chest pain 04/23/2013  . HTN (hypertension) 04/23/2013  . Headache(784.0) 04/23/2013  . Nodule of left lung 03/30/2011  . Allergic rhinitis   . Arthritis   . Gastroesophageal reflux disease with hiatal hernia   . IBS (irritable bowel syndrome)      Lanney Gins, PT, DPT 05/11/16 9:07 AM   [facility] [address]  [place], [place], [address] Phone: [phone]   Fax:  [phone]  Name: Carrie Elliott MRN: [id number] Date of Birth: 01-11-1951

## 2016-05-18 ENCOUNTER — Ambulatory Visit: Payer: 59 | Admitting: Physical Therapy

## 2016-05-18 DIAGNOSIS — M25551 Pain in right hip: Secondary | ICD-10-CM

## 2016-05-18 DIAGNOSIS — M6281 Muscle weakness (generalized): Secondary | ICD-10-CM

## 2016-05-18 MED FILL — raNITIdine HCL 150 MG TABS: 150 | 90 days supply | Qty: 360 | Fill #5

## 2016-05-18 NOTE — Patient Instructions (Signed)
  Single leg bridge - 15 reps

## 2016-05-18 NOTE — Therapy (Signed)
[facility] [address]  [place], [place], [address] Phone: [phone]   Fax:  [phone]  Physical Therapy Treatment  Patient Details  Name: JAHAIRA NUTLEY MRN: [id number] Date of Birth: April 17, 1950 Referring Provider: Dr. Mosie Lukes  Encounter Date: 05/18/2016      PT End of Session - 05/18/16 0808    Visit Number 6   Number of Visits 12   Date for PT Re-Evaluation 05/25/16   PT Start Time 0801   PT Stop Time [id number]   PT Time Calculation (min) 46 min   Activity Tolerance Patient tolerated treatment well   Behavior During Therapy [facility] for tasks assessed/performed      Past Medical History:  Diagnosis Date  . Allergic rhinitis   . Allergy   . Anemia 08/24/2014  . Arthritis    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, shoulders, knees  . Benign paroxysmal positional vertigo 07/12/2013  . Chicken pox   . Cystocele    grade 2  . Endometriosis    with Menometrorhaghia   . Gastric polyps 09/30/2015  . Gastroesophageal reflux disease with hiatal hernia    Cough Sees Dr Laretta Bolster   . GERD (gastroesophageal reflux disease)   . Hemorrhoids   . History of colonic polyps   . History of colonic polyps 09/30/2015   adenoma  . HTN (hypertension) 04/23/2013  . Hypocalcemia 09/30/2015  . IBS (irritable bowel syndrome)   . Measles   . Mumps   . Muscle spasm 09/30/2015  . Osteopenia 10/09/2015  . Rectocele    grade 2  . Reflux    cough  . Rosacea   . Sternal fracture    1986  . Sun-damaged skin 09/05/2014  . Vitamin D deficiency 04/06/2016  . 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 APPENDECTOMY  1989  . atypical nevus excision    . CHOLECYSTECTOMY  1989  . COLONOSCOPY  2011  . COLONOSCOPY WITH PROPOFOL N/A 05/27/2015   Procedure: COLONOSCOPY WITH PROPOFOL;  Surgeon: Clarene Essex, MD;  Location: [facility];  Service: Endoscopy;  Laterality: N/A;  . CYSTOSCOPY W/ URETERAL STENT REMOVAL  1999  .  ESOPHAGOGASTRODUODENOSCOPY  2011   with esophageal stent, perforation   . ESOPHAGOGASTRODUODENOSCOPY (EGD) WITH PROPOFOL N/A 05/27/2015   Procedure: ESOPHAGOGASTRODUODENOSCOPY (EGD) WITH PROPOFOL;  Surgeon: Clarene Essex, MD;  [facility];  Service: Endoscopy;  Laterality: N/A;  . HEMORRHOID SURGERY  2005  . LEFT OOPHORECTOMY  [id number]   complicated by ureteral injuery requiring a ureteral stent   . MMK / ANTERIOR VESICOURETHROPEXY / URETHROPEXY  2000  . TONSILLECTOMY AND ADENOIDECTOMY    . TOTAL ABDOMINAL HYSTERECTOMY  2000  . TUBAL LIGATION  1981  . wisdom teeth removal  1964    There were no vitals filed for this visit.      Subjective Assessment - 05/18/16 0800    Subjective continues to have pain - can't pinpoint when pain is exacerbated throughout day   Patient is accompained by: Family member   Pertinent History no esophogeal sphincter; GI disturbance   Diagnostic tests xray - no diagnostic findings   Patient Stated Goals reduce pain   Currently in Pain? Yes   Pain Score 4    Pain Location Hip   Pain Orientation Right   Pain Descriptors / Indicators Aching;Sore   Pain Type Chronic pain   Pain Onset More than a month ago  Pain Frequency Constant   Aggravating Factors  sitting and lying down   Pain Relieving Factors moving                         [facility] Adult PT Treatment/Exercise - 05/18/16 0001      Knee/Hip Exercises: Stretches   Hip Flexor Stretch Right;3 reps;30 seconds   Hip Flexor Stretch Limitations 1/2 kneeling 3 x 30 seconds   Other Knee/Hip Stretches seated self massage to proximal hamstrings and piriformisx 2 minutes   Other Knee/Hip Stretches adductor stretch - supine wit strap - 1 x 30 sec with little stretch felt     Knee/Hip Exercises: Aerobic   Stationary Bike L2 x 6 minutes     Knee/Hip Exercises: Standing   Hip Extension Stengthening;Right;15 reps;Knee straight   Extension Limitations (glut med) - blue tband   Functional  Squat 15 reps   Functional Squat Limitations to low box with 2 foam pads with 5# weight in B UE     Knee/Hip Exercises: Seated   Other Seated Knee/Hip Exercises sciatic nerve glide - R LE - HEP review     Knee/Hip Exercises: Supine   Straight Leg Raises Strengthening;Right;15 reps   Other Supine Knee/Hip Exercises core engagement - R LE fallout with blue tband x 15 reps - 2 sets     Knee/Hip Exercises: Sidelying   Hip ABduction Strengthening;Right;15 reps   Hip ABduction Limitations 2#     Modalities   Modalities Iontophoresis     Iontophoresis   Type of Iontophoresis Dexamethasone   Location R hip   Dose 1.0 mL   Time 4-6 hours; 80 mA                     PT Long Term Goals - 04/20/16 0909      PT LONG TERM GOAL #1   Title patient to be independent with advanced HEP (05/25/16)   Status On-going     PT LONG TERM GOAL #2   Title Patient to improve R hip flexor/quad flexibility as demonstrated by foot 3" from buttock (05/25/16)   Status On-going     PT LONG TERM GOAL #3   Title Patient to report ability to lay supine and on R side with pain no greater than 2/10 for greater than 2 weeks (05/25/16)   Status On-going     PT LONG TERM GOAL #4   Title Patient to improve B LE strength to >/= 4/5 with no increase in pain (05/25/16)   Status On-going               Plan - 05/18/16 1144    Clinical Impression Statement Patient with continued R buttock/hip pain. Some benefit noted with sciatic nerve glide as well as stretching to hamstring and piriformis mm. Patient to acquire small ball for hame manual massage this weekend as patient feels this is also beneficial. Education to continue hip flexor stretch at home. Patient to continue to benefit from PT to maximize function.    PT Treatment/Interventions ADLs/Self Care Home Management;Cryotherapy;Electrical Stimulation;Iontophoresis 4mg /ml Dexamethasone;Moist Heat;Ultrasound;Neuromuscular re-education;Balance  training;Therapeutic exercise;Therapeutic activities;Functional mobility training;Stair training;Patient/family education;Gait training;Manual techniques;Passive range of motion;Vasopneumatic Device;Taping;Dry needling   PT Next Visit Plan gentle strengthening per patient tolerance   Consulted and Agree with Plan of Care Patient      Patient will benefit from skilled therapeutic intervention in order to improve the following deficits and impairments:  Decreased activity tolerance, Decreased balance,  Decreased range of motion, Decreased strength, Pain  Visit Diagnosis: Pain in right hip  Muscle weakness (generalized)     Problem List Patient Active Problem List   Diagnosis Date Noted  . Vitamin D deficiency 04/06/2016  . Candidal dermatitis 04/06/2016  . Osteopenia 10/09/2015  . Hypocalcemia 09/30/2015  . 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 History of colonic polyps 09/30/2015  . Gastric polyps 09/30/2015  . Hip pain 09/05/2014  . Urinary frequency 09/05/2014  . Preventative health care 09/05/2014  . Sun-damaged skin 09/05/2014  . Anemia 08/24/2014  . Cervical cancer screening 08/24/2014  . Myalgia and myositis 02/11/2014  . Benign paroxysmal positional vertigo 07/12/2013  . History of chicken pox   . Chest pain 04/23/2013  . HTN (hypertension) 04/23/2013  . Headache(784.0) 04/23/2013  . Nodule of left lung 03/30/2011  . Allergic rhinitis   . Arthritis   . Gastroesophageal reflux disease with hiatal hernia   . IBS (irritable bowel syndrome)     Lanney Gins, PT, DPT 05/18/16 11:50 AM   [facility] [address]  [place], [place], [address] Phone: [phone]   Fax:  [phone]  Name: TNISHA MAUTHE MRN: [id number] Date of Birth: 07/26/1950

## 2016-05-25 ENCOUNTER — Ambulatory Visit: Payer: 59 | Attending: Family Medicine | Admitting: Physical Therapy

## 2016-05-25 DIAGNOSIS — M25551 Pain in right hip: Secondary | ICD-10-CM | POA: Insufficient documentation

## 2016-05-25 DIAGNOSIS — M6281 Muscle weakness (generalized): Secondary | ICD-10-CM | POA: Diagnosis not present

## 2016-05-25 NOTE — Therapy (Addendum)
[facility] [address]  [place], [place], [address] Phone: [phone]   Fax:  [phone]  Physical Therapy Treatment  Patient Details  Name: Carrie Elliott MRN: [id number] Date of Birth: 1950/09/25 Referring Provider: Dr. Mosie Lukes  Encounter Date: 05/25/2016      PT End of Session - 05/25/16 0811    Visit Number 7   Number of Visits 12   Date for PT Re-Evaluation 05/25/16   PT Start Time 0804   PT Stop Time 0233   PT Time Calculation (min) 43 min   Activity Tolerance Patient tolerated treatment well   Behavior During Therapy [facility] for tasks assessed/performed      Past Medical History:  Diagnosis Date  . Allergic rhinitis   . Allergy   . Anemia 08/24/2014  . Arthritis    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, shoulders, knees  . Benign paroxysmal positional vertigo 07/12/2013  . Chicken pox   . Cystocele    grade 2  . Endometriosis    with Menometrorhaghia   . Gastric polyps 09/30/2015  . Gastroesophageal reflux disease with hiatal hernia    Cough Sees Dr Laretta Bolster   . GERD (gastroesophageal reflux disease)   . Hemorrhoids   . History of colonic polyps   . History of colonic polyps 09/30/2015   adenoma  . HTN (hypertension) 04/23/2013  . Hypocalcemia 09/30/2015  . IBS (irritable bowel syndrome)   . Measles   . Mumps   . Muscle spasm 09/30/2015  . Osteopenia 10/09/2015  . Rectocele    grade 2  . Reflux    cough  . Rosacea   . Sternal fracture    1986  . Sun-damaged skin 09/05/2014  . Vitamin D deficiency 04/06/2016  . 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 APPENDECTOMY  1989  . atypical nevus excision    . CHOLECYSTECTOMY  1989  . COLONOSCOPY  2011  . COLONOSCOPY WITH PROPOFOL N/A 05/27/2015   Procedure: COLONOSCOPY WITH PROPOFOL;  Surgeon: Clarene Essex, MD;  Location: [facility];  Service: Endoscopy;  Laterality: N/A;  . CYSTOSCOPY W/ URETERAL STENT REMOVAL  1999  .  ESOPHAGOGASTRODUODENOSCOPY  2011   with esophageal stent, perforation   . ESOPHAGOGASTRODUODENOSCOPY (EGD) WITH PROPOFOL N/A 05/27/2015   Procedure: ESOPHAGOGASTRODUODENOSCOPY (EGD) WITH PROPOFOL;  Surgeon: Clarene Essex, MD;  Location: [facility];  Service: Endoscopy;  Laterality: N/A;  . HEMORRHOID SURGERY  2005  . LEFT OOPHORECTOMY  4356   complicated by ureteral injuery requiring a ureteral stent   . MMK / ANTERIOR VESICOURETHROPEXY / URETHROPEXY  2000  . TONSILLECTOMY AND ADENOIDECTOMY    . TOTAL ABDOMINAL HYSTERECTOMY  2000  . TUBAL LIGATION  1981  . wisdom teeth removal  1964    There were no vitals filed for this visit.      Subjective Assessment - 05/25/16 0810    Subjective "I dont like my leg"   Pertinent History no esophogeal sphincter; GI disturbance   Patient Stated Goals reduce pain   Currently in Pain? Yes   Pain Score 6    Pain Location Hip   Pain Orientation Right   Pain Descriptors / Indicators Aching   Pain Type Chronic pain   Pain Onset More than a month ago   Pain Frequency Constant   Aggravating Factors  sitting and lying down   Pain Relieving Factors moving  [facility] Adult PT Treatment/Exercise - 05/25/16 0814      Knee/Hip Exercises: Stretches   Passive Hamstring Stretch Right;3 reps;30 seconds   Passive Hamstring Stretch Limitations seated with R LE extended   Piriformis Stretch Right;3 reps;30 seconds   Piriformis Stretch Limitations seated figure 4      Knee/Hip Exercises: Aerobic   Nustep level 3 x 6 minutes     Knee/Hip Exercises: Standing   Side Lunges Limitations side stepping 40 feet x 2 each way with green tband at ankles   Hip Extension Stengthening;Right;15 reps;Knee straight;2 sets   Extension Limitations (glut med) - 3#   Other Standing Knee Exercises standing hip drops 2 x 15 B LE     Knee/Hip Exercises: Prone   Hip Extension Both;2 sets;15 reps   Hip Extension Limitations quadruped,  progression to "fire hydrants" 2 x 15 B LE     Modalities   Modalities Iontophoresis     Iontophoresis   Type of Iontophoresis Dexamethasone   Location R hip   Dose 1.0 mL   Time 4-6 hours; 80 mA                     PT Long Term Goals - 05/25/16 5427      PT LONG TERM GOAL #1   Title patient to be independent with advanced HEP (05/25/16)   Status Achieved     PT LONG TERM GOAL #2   Title Patient to improve R hip flexor/quad flexibility as demonstrated by foot 3" from buttock (05/25/16)   Status On-going     PT LONG TERM GOAL #3   Title Patient to report ability to lay supine and on R side with pain no greater than 2/10 for greater than 2 weeks (05/25/16)   Status On-going     PT LONG TERM GOAL #4   Title Patient to improve B LE strength to >/= 4/5 with no increase in pain (05/25/16)   Status On-going               Plan - 05/25/16 0623    Clinical Impression Statement Patient continuing to report R hip/buttock/leg pain limiting sleeping and overall mobility. Patient doing well with all stretching and hip strengthening progression with no increase in pain while in session. Ionto continued, however unsure of overall benefit. Patient would like to follow-up with MD prior to conitnuing PT, as is she is unure of she needs an injection or further imaging.    PT Treatment/Interventions ADLs/Self Care Home Management;Cryotherapy;Electrical Stimulation;Iontophoresis 53m/ml Dexamethasone;Moist Heat;Ultrasound;Neuromuscular re-education;Balance training;Therapeutic exercise;Therapeutic activities;Functional mobility training;Stair training;Patient/family education;Gait training;Manual techniques;Passive range of motion;Vasopneumatic Device;Taping;Dry needling   PT Next Visit Plan possible 30 day hold - patient to follow-up with MD   Consulted and Agree with Plan of Care Patient      Patient will benefit from skilled therapeutic intervention in order to improve the following  deficits and impairments:  Decreased activity tolerance, Decreased balance, Decreased range of motion, Decreased strength, Pain  Visit Diagnosis: Pain in right hip  Muscle weakness (generalized)     Problem List Patient Active Problem List   Diagnosis Date Noted  . Vitamin D deficiency 04/06/2016  . Candidal dermatitis 04/06/2016  . Osteopenia 10/09/2015  . Hypocalcemia 09/30/2015  . 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 History of colonic polyps 09/30/2015  . Gastric polyps 09/30/2015  . Hip pain 09/05/2014  . Urinary frequency 09/05/2014  . Preventative health care 09/05/2014  . Sun-damaged skin 09/05/2014  .  Anemia 08/24/2014  . Cervical cancer screening 08/24/2014  . Myalgia and myositis 02/11/2014  . Benign paroxysmal positional vertigo 07/12/2013  . History of chicken pox   . Chest pain 04/23/2013  . HTN (hypertension) 04/23/2013  . Headache(784.0) 04/23/2013  . Nodule of left lung 03/30/2011  . Allergic rhinitis   . Arthritis   . Gastroesophageal reflux disease with hiatal hernia   . IBS (irritable bowel syndrome)     Lanney Gins, PT, DPT 05/25/16 9:55 AM   PHYSICAL THERAPY DISCHARGE SUMMARY  Visits from Start of Care: 7  Current functional level related to goals / functional outcomes: See above   Remaining deficits: See above - continued hip pain. Wanting to follow up with MD before returning to PT   Education / Equipment: HEP  Plan: Patient agrees to discharge.  Patient goals were partially met. Patient is being discharged due to not returning since the last visit.  ?????    Lanney Gins, PT, DPT 06/29/16 8:29 AM    [facility] [address]  [place], [place], [address] Phone: [phone]   Fax:  [phone]  Name: Carrie Elliott MRN: [id number] Date of Birth: 07/23/1950

## 2016-05-25 NOTE — Patient Instructions (Signed)
Hip - Fire Hydrants   15 reps - up to 2 times daily   Quadruped - Hip extension   15 times - up to 2 times daily   Hip drops   15 times - both legs

## 2016-05-28 ENCOUNTER — Telehealth: Payer: Self-pay

## 2016-05-28 MED ORDER — OSELTAMIVIR PHOSPHATE 75 MG PO CAPS: 75.0000 mg | ORAL_CAPSULE | Freq: Two times a day (BID) | ORAL | 0 refills | Status: DC

## 2016-05-28 MED FILL — OSELTAMIVIR PHOSPHATE 75 MG: 75 | 5 days supply | Qty: 10 | Fill #0

## 2016-05-28 NOTE — Telephone Encounter (Signed)
Patient c/o not feeling well. Coughing. Aching. No fever.

## 2016-05-29 ENCOUNTER — Encounter: Payer: Self-pay | Admitting: Family Medicine

## 2016-05-29 ENCOUNTER — Ambulatory Visit (INDEPENDENT_AMBULATORY_CARE_PROVIDER_SITE_OTHER): Payer: 59 | Admitting: Family Medicine

## 2016-05-29 VITALS — BP 122/82 | HR 63 | Temp 99.9°F | Resp 16 | Ht 61.0 in | Wt 167.0 lb

## 2016-05-29 DIAGNOSIS — J111 Influenza due to unidentified influenza virus with other respiratory manifestations: Secondary | ICD-10-CM | POA: Diagnosis not present

## 2016-05-29 DIAGNOSIS — J029 Acute pharyngitis, unspecified: Secondary | ICD-10-CM

## 2016-05-29 DIAGNOSIS — R059 Cough, unspecified: Secondary | ICD-10-CM

## 2016-05-29 DIAGNOSIS — R05 Cough: Secondary | ICD-10-CM

## 2016-05-29 DIAGNOSIS — R509 Fever, unspecified: Secondary | ICD-10-CM | POA: Diagnosis not present

## 2016-05-29 LAB — POC INFLUENZA A&B (BINAX/QUICKVUE)
INFLUENZA A, POC: POSITIVE — AB
Influenza B, POC: NEGATIVE

## 2016-05-29 LAB — POCT RAPID STREP A (OFFICE): Rapid Strep A Screen: NEGATIVE

## 2016-05-29 MED ORDER — PROMETHAZINE-DM 6.25-15 MG/5ML PO SYRP: 5.0000 mL | ORAL_SOLUTION | Freq: Four times a day (QID) | ORAL | 0 refills | Status: DC | PRN

## 2016-05-29 MED FILL — PROMETHAZINE-DM SYRUP: 6.25-15 | 6 days supply | Qty: 118 | Fill #0

## 2016-05-29 NOTE — Progress Notes (Signed)
Patient ID: Carrie Elliott, female   DOB: [birth year], 66 y.o.   MRN: [id number]    Subjective:  I acted as a Education administrator for Dr. Carollee Herter.  Carrie Elliott, Carrie Elliott   Patient ID: Carrie Elliott, female    DOB: 10/13/1950, 66 y.o.   MRN: [id number]  Chief Complaint  Patient presents with  . Cough    monday, chills,   . Fever  . Sore Throat    HPI  Patient is in today for cough, headache, body aches,  sore throat and fatigue.  Started to feel a little bad on Sunday but when she got up today she felt worse.  She has been taking Tylenol.  Has been blowing clear stuff of nose.  Tamiflu was sent in by her job but she has not started yet.    Patient Care Team: Mosie Lukes, MD as PCP - General (Family Medicine)   Past Medical History:  Diagnosis Date  . Allergic rhinitis   . Allergy   . Anemia 08/24/2014  . Arthritis    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, shoulders, knees  . Benign paroxysmal positional vertigo 07/12/2013  . Chicken pox   . Cystocele    grade 2  . Endometriosis    with Menometrorhaghia   . Gastric polyps 09/30/2015  . Gastroesophageal reflux disease with hiatal hernia    Cough Sees Dr Laretta Bolster   . GERD (gastroesophageal reflux disease)   . Hemorrhoids   . History of colonic polyps   . History of colonic polyps 09/30/2015   adenoma  . HTN (hypertension) 04/23/2013  . Hypocalcemia 09/30/2015  . IBS (irritable bowel syndrome)   . Measles   . Mumps   . Muscle spasm 09/30/2015  . Osteopenia 10/09/2015  . Rectocele    grade 2  . Reflux    cough  . Rosacea   . Sternal fracture    1986  . Sun-damaged skin 09/05/2014  . Vitamin D deficiency 04/06/2016  . 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 APPENDECTOMY  1989  . atypical nevus excision    . CHOLECYSTECTOMY  1989  . COLONOSCOPY  2011  . COLONOSCOPY WITH PROPOFOL N/A 05/27/2015   Procedure: COLONOSCOPY WITH PROPOFOL;  Surgeon: Clarene Essex, MD;  Location: [facility];  Service: Endoscopy;  Laterality: N/A;    . CYSTOSCOPY W/ URETERAL STENT REMOVAL  1999  . ESOPHAGOGASTRODUODENOSCOPY  2011   with esophageal stent, perforation   . ESOPHAGOGASTRODUODENOSCOPY (EGD) WITH PROPOFOL N/A 05/27/2015   Procedure: ESOPHAGOGASTRODUODENOSCOPY (EGD) WITH PROPOFOL;  Surgeon: Clarene Essex, MD;  Location: [facility];  Service: Endoscopy;  Laterality: N/A;  . HEMORRHOID SURGERY  2005  . LEFT OOPHORECTOMY  [id number]   complicated by ureteral injuery requiring a ureteral stent   . MMK / ANTERIOR VESICOURETHROPEXY / URETHROPEXY  2000  . TONSILLECTOMY AND ADENOIDECTOMY    . TOTAL ABDOMINAL HYSTERECTOMY  2000  . TUBAL LIGATION  1981  . wisdom teeth removal  1964    Family History  Problem Relation Age of Onset  . Colon polyps Father   . Hypertension Father   . Heart failure Father   . Melanoma Father     multiple  . Heart attack Father     x2  . Heart disease Father     Bundle Block  . Meniere's disease Father   . Cancer Father     melanoma  . Osteoporosis Mother   . Transient ischemic attack Mother   .  Cancer Mother     colon  . Heart disease Mother     afib  . Hyperlipidemia Mother   . Diabetes Paternal Grandmother   . Stroke Maternal Grandmother   . Cancer Maternal Grandmother     skin, melanoma, sarcoma  . Colon polyps Maternal Grandmother   . Diverticulitis Maternal Grandmother   . Stroke Maternal Grandfather   . Heart disease Maternal Grandfather   . Obesity Brother     Social History   Social History  . Marital status: Married    Spouse name: N/A  . Number of children: N/A  . Years of education: N/A   Occupational History  . Not on file.   Social History Main Topics  . Smoking status: Never Smoker  . Smokeless tobacco: Never Used  . Alcohol use No  . Drug use: No  . 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 Not on file   Social History Narrative  . No narrative on file    Outpatient  Medications Prior to Visit  Medication Sig Dispense Refill  . acetaminophen (TYLENOL) 500 MG tablet Take 1,000 mg by mouth every 6 (six) hours as needed for headache (pain).    . benzocaine (ORAJEL) 10 % mucosal gel Use as directed 1 application in the mouth or throat as needed for mouth pain. Reported on 07/22/2015    . cholecalciferol (VITAMIN D) 1000 units tablet Take 1,000 Units by mouth daily.    Marland Kitchen esomeprazole (NEXIUM) 40 MG capsule Take 1 capsule (40 mg total) by mouth 2 (two) times daily before a meal. 180 capsule 3  . fexofenadine (ALLEGRA) 180 MG tablet Take 180 mg by mouth every morning.     . Lidocaine-Hydrocortisone Ace 3-0.5 % CREA Apply 1 application topically daily as needed (hemmoriods.).     Marland Kitchen meclizine (ANTIVERT) 25 MG tablet Take 1 tablet (25 mg total) by mouth 3 (three) times daily as needed for dizziness. 30 tablet 2  . metoprolol succinate (TOPROL-XL) 50 MG 24 hr tablet TAKE 1 TABLET (50 MG TOTAL) BY MOUTH DAILY. 90 tablet 3  . mometasone (NASONEX) 50 MCG/ACT nasal spray Place 1-2 sprays into the nose daily as needed. Reported on 07/22/2015 17 g 5  . nystatin ointment (MYCOSTATIN) Apply 1 application topically 2 (two) times daily. 30 g 1  . ondansetron (ZOFRAN) 8 MG tablet Take 1 tablet (8 mg total) by mouth every 8 (eight) hours as needed for nausea or vomiting. 90 tablet 1  . oseltamivir (TAMIFLU) 75 MG capsule Take 1 capsule (75 mg total) by mouth 2 (two) times daily. 10 capsule 0  . ranitidine (ZANTAC) 150 MG tablet Take 150 mg by mouth 4 (four) times daily.    Marland Kitchen SIMPLY SALINE NA Place 1 each into the nose daily.     . Multiple Vitamins-Minerals (MULTIVITAMIN ADULT) CHEW Chew 1 tablet by mouth 3 (three) times a week.    . Vitamin D, Ergocalciferol, (DRISDOL) 50000 units CAPS capsule Take 1 capsule (50,000 Units total) by mouth every 7 (seven) days. (Patient not taking: Reported on 04/06/2016) 4 capsule 4   No facility-administered medications prior to visit.      Allergies  Allergen Reactions  . Cefdinir     HIVES  . Celebrex [Celecoxib]     ABDOMINAL PAIN   . Ciprofloxacin     ABDOMINAL PAIN   . Codeine  NAUSEA VOMITING - can take with zofran  . Pseudoephedrine Hcl Er     TACHYCARDIA     Review of Systems  Constitutional: Positive for chills, fever and malaise/fatigue.  HENT: Positive for sore throat. Negative for congestion.   Eyes: Negative for blurred vision.  Respiratory: Positive for cough. Negative for shortness of breath.   Cardiovascular: Negative for chest pain, palpitations and leg swelling.  Gastrointestinal: Negative for vomiting.  Musculoskeletal: Negative for back pain.  Skin: Negative for rash.  Neurological: Negative for loss of consciousness and headaches.       Objective:    Physical Exam  Constitutional: She is oriented to person, place, and time. She appears well-developed and well-nourished. No distress.  HENT:  Head: Normocephalic and atraumatic.  Right Ear: Hearing, tympanic membrane, external ear and ear canal normal.  Left Ear: Hearing, tympanic membrane, external ear and ear canal normal.  Mouth/Throat: Posterior oropharyngeal erythema present.  Eyes: Conjunctivae are normal.  Neck: Normal range of motion. No thyromegaly present.  Cardiovascular: Normal rate and regular rhythm.   Pulmonary/Chest: Effort normal and breath sounds normal. No respiratory distress. She has no wheezes. She has no rales. She exhibits no tenderness.  Abdominal: Soft. Bowel sounds are normal. There is no tenderness.  Musculoskeletal: Normal range of motion. She exhibits no edema or deformity.  Neurological: She is alert and oriented to person, place, and time.  Skin: Skin is warm and dry. She is not diaphoretic.  Psychiatric: She has a normal mood and affect. Her behavior is normal. Judgment and thought content normal.    BP 122/82 (BP Location: Left Arm, Cuff Size: Normal)   Pulse 63   Temp 99.9 F (37.7 C) (Oral)    Resp 16   Ht 5\' 1"  (1.549 m)   Wt 167 lb (75.8 kg)   SpO2 97%   BMI 31.55 kg/m  Wt Readings from Last 3 Encounters:  05/29/16 167 lb (75.8 kg)  04/06/16 168 lb 3.2 oz (76.3 kg)  09/30/15 159 lb 6 oz (72.3 kg)     Lab Results  Component Value Date   WBC 5.2 04/06/2016   HGB 11.1 (L) 04/06/2016   HCT 34.0 (L) 04/06/2016   PLT 285.0 04/06/2016   GLUCOSE 91 04/06/2016   CHOL 205 (H) 09/30/2015   TRIG 145.0 09/30/2015   HDL 64.40 09/30/2015   LDLCALC 112 (H) 09/30/2015   ALT 14 04/06/2016   AST 16 04/06/2016   NA 137 04/06/2016   K 4.2 04/06/2016   CL 105 04/06/2016   CREATININE 0.79 04/06/2016   BUN 20 04/06/2016   CO2 26 04/06/2016   TSH 0.71 04/06/2016   INR 1.19 02/07/2010    Lab Results  Component Value Date   TSH 0.71 04/06/2016   Lab Results  Component Value Date   WBC 5.2 04/06/2016   HGB 11.1 (L) 04/06/2016   HCT 34.0 (L) 04/06/2016   MCV 76.9 (L) 04/06/2016   PLT 285.0 04/06/2016   Lab Results  Component Value Date   NA 137 04/06/2016   K 4.2 04/06/2016   CO2 26 04/06/2016   GLUCOSE 91 04/06/2016   BUN 20 04/06/2016   CREATININE 0.79 04/06/2016   BILITOT 0.4 04/06/2016   ALKPHOS 59 04/06/2016   AST 16 04/06/2016   ALT 14 04/06/2016   PROT 7.4 04/06/2016   ALBUMIN 4.3 04/06/2016   CALCIUM 9.7 04/06/2016   GFR 77.50 04/06/2016   Lab Results  Component Value Date   CHOL 205 (H)  09/30/2015   Lab Results  Component Value Date   HDL 64.40 09/30/2015   Lab Results  Component Value Date   LDLCALC 112 (H) 09/30/2015   Lab Results  Component Value Date   TRIG 145.0 09/30/2015   Lab Results  Component Value Date   CHOLHDL 3 09/30/2015   No results found for: HGBA1C     Assessment & Plan:   Problem List Items Addressed This Visit      Unprioritized   Influenza    tamiflu sent to pharmacy yesterday by allergy / asthma Pt will pick up and take it Phenergan dm sent to pharmacy tamiflu sent in for her husband as well        Other Visit Diagnoses    Cough    -  Primary   Relevant Orders   POC Influenza A&B (Binax test) (Completed)   Fever, unspecified fever cause       Relevant Orders   POC Influenza A&B (Binax test) (Completed)   Sore throat       Relevant Orders   POCT rapid strep A (Completed)      I have discontinued Ms. Kiker's MULTIVITAMIN ADULT and Vitamin D (Ergocalciferol). I am also having her start on promethazine-dextromethorphan. Additionally, I am having her maintain her fexofenadine, acetaminophen, benzocaine, Lidocaine-Hydrocortisone Ace, SIMPLY SALINE NA, ondansetron, ranitidine, metoprolol succinate, esomeprazole, mometasone, meclizine, nystatin ointment, cholecalciferol, and oseltamivir.  Meds ordered this encounter  Medications  . promethazine-dextromethorphan (PROMETHAZINE-DM) 6.25-15 MG/5ML syrup    Sig: Take 5 mLs by mouth 4 (four) times daily as needed for cough.    Dispense:  118 mL    Refill:  0    CMA served as scribe during this visit. History, Physical and Plan performed by medical provider. Documentation and orders reviewed and attested to.  Ann Held, DO

## 2016-05-29 NOTE — Assessment & Plan Note (Signed)
tamiflu sent to pharmacy yesterday by allergy / asthma Pt will pick up and take it Phenergan dm sent to pharmacy tamiflu sent in for her husband as well

## 2016-05-29 NOTE — Patient Instructions (Addendum)
If your cough gets worse please call and we do a chest xray.  You can take ibuprofen or Tylenol for pain and fever.  Cough syrup the promethazine with dm can make you sleepy.  If you need something during the day you can try Delsym over the counter.   Influenza, Adult Influenza, more commonly known as "the flu," is a viral infection that primarily affects the respiratory tract. The respiratory tract includes organs that help you breathe, such as the lungs, nose, and throat. The flu causes many common cold symptoms, as well as a high fever and body aches. The flu spreads easily from person to person (is contagious). Getting a flu shot (influenza vaccination) every year is the best way to prevent influenza. What are the causes? Influenza is caused by a virus. You can catch the virus by:  Breathing in droplets from an infected person's cough or sneeze.  Touching something that was recently contaminated with the virus and then touching your mouth, nose, or eyes. What increases the risk? The following factors may make you more likely to get the flu:  Not cleaning your hands frequently with soap and water or alcohol-based hand sanitizer.  Having close contact with many people during cold and flu season.  Touching your mouth, eyes, or nose without washing or sanitizing your hands first.  Not drinking enough fluids or not eating a healthy diet.  Not getting enough sleep or exercise.  Being under a high amount of stress.  Not getting a yearly (annual) flu shot. You may be at a higher risk of complications from the flu, such as a severe lung infection (pneumonia), if you:  Are over the age of 53.  Are pregnant.  Have a weakened disease-fighting system (immune system). You may have a weakened immune system if you:  Have HIV or AIDS.  Are undergoing chemotherapy.  Aretaking medicines that reduce the activity of (suppress) the immune system.  Have a long-term (chronic) illness, such as  heart disease, kidney disease, diabetes, or lung disease.  Have a liver disorder.  Are obese.  Have anemia. What are the signs or symptoms? Symptoms of this condition typically last 4-10 days and may include:  Fever.  Chills.  Headache, body aches, or muscle aches.  Sore throat.  Cough.  Runny or congested nose.  Chest discomfort and cough.  Poor appetite.  Weakness or tiredness (fatigue).  Dizziness.  Nausea or vomiting. How is this diagnosed? This condition may be diagnosed based on your medical history and a physical exam. Your health care provider may do a nose or throat swab test to confirm the diagnosis. How is this treated? If influenza is detected early, you can be treated with antiviral medicine that can reduce the length of your illness and the severity of your symptoms. This medicine may be given by mouth (orally) or through an IV tube that is inserted in one of your veins. The goal of treatment is to relieve symptoms by taking care of yourself at home. This may include taking over-the-counter medicines, drinking plenty of fluids, and adding humidity to the air in your home. In some cases, influenza goes away on its own. Severe influenza or complications from influenza may be treated in a hospital. Follow these instructions at home:  Take over-the-counter and prescription medicines only as told by your health care provider.  Use a cool mist humidifier to add humidity to the air in your home. This can make breathing easier.  Rest as needed.  Drink enough fluid to keep your urine clear or pale yellow.  Cover your mouth and nose when you cough or sneeze.  Wash your hands with soap and water often, especially after you cough or sneeze. If soap and water are not available, use hand sanitizer.  Stay home from work or school as told by your health care provider. Unless you are visiting your health care provider, try to avoid leaving home until your fever has  been gone for 24 hours without the use of medicine.  Keep all follow-up visits as told by your health care provider. This is important. How is this prevented?  Getting an annual flu shot is the best way to avoid getting the flu. You may get the flu shot in late summer, fall, or winter. Ask your health care provider when you should get your flu shot.  Wash your hands often or use hand sanitizer often.  Avoid contact with people who are sick during cold and flu season.  Eat a healthy diet, drink plenty of fluids, get enough sleep, and exercise regularly. Contact a health care provider if:  You develop new symptoms.  You have:  Chest pain.  Diarrhea.  A fever.  Your cough gets worse.  You produce more mucus.  You feel nauseous or you vomit. Get help right away if:  You develop shortness of breath or difficulty breathing.  Your skin or nails turn a bluish color.  You have severe pain or stiffness in your neck.  You develop a sudden headache or sudden pain in your face or ear.  You cannot stop vomiting. This information is not intended to replace advice given to you by your health care provider. Make sure you discuss any questions you have with your health care provider. Document Released: 03/09/2000 Document Revised: 08/18/2015 Document Reviewed: 01/04/2015 Elsevier Interactive Patient Education  2017 [employer].

## 2016-05-29 NOTE — Progress Notes (Signed)
Pre visit review using our clinic review tool, if applicable. No additional management support is needed unless otherwise documented below in the visit note. 

## 2016-05-31 ENCOUNTER — Encounter: Payer: Self-pay | Admitting: Medical

## 2016-05-31 ENCOUNTER — Encounter (HOSPITAL_BASED_OUTPATIENT_CLINIC_OR_DEPARTMENT_OTHER): Payer: Self-pay | Admitting: *Deleted

## 2016-05-31 ENCOUNTER — Emergency Department (HOSPITAL_BASED_OUTPATIENT_CLINIC_OR_DEPARTMENT_OTHER)
Admission: EM | Admit: 2016-05-31 | Discharge: 2016-05-31 | Disposition: A | Discharge status: Home / Self Care | Payer: 59 | Attending: Emergency Medicine | Admitting: Emergency Medicine

## 2016-05-31 ENCOUNTER — Ambulatory Visit (INDEPENDENT_AMBULATORY_CARE_PROVIDER_SITE_OTHER): Payer: 59 | Admitting: Medical

## 2016-05-31 ENCOUNTER — Emergency Department (HOSPITAL_BASED_OUTPATIENT_CLINIC_OR_DEPARTMENT_OTHER): Payer: 59

## 2016-05-31 VITALS — BP 136/84 | HR 100 | Temp 98.2°F | Resp 16 | Ht 61.0 in | Wt 165.5 lb

## 2016-05-31 DIAGNOSIS — R5383 Other fatigue: Secondary | ICD-10-CM | POA: Diagnosis not present

## 2016-05-31 DIAGNOSIS — J111 Influenza due to unidentified influenza virus with other respiratory manifestations: Secondary | ICD-10-CM

## 2016-05-31 DIAGNOSIS — J09X2 Influenza due to identified novel influenza A virus with other respiratory manifestations: Secondary | ICD-10-CM | POA: Diagnosis not present

## 2016-05-31 DIAGNOSIS — I10 Essential (primary) hypertension: Secondary | ICD-10-CM | POA: Diagnosis not present

## 2016-05-31 DIAGNOSIS — J101 Influenza due to other identified influenza virus with other respiratory manifestations: Secondary | ICD-10-CM | POA: Insufficient documentation

## 2016-05-31 DIAGNOSIS — J029 Acute pharyngitis, unspecified: Secondary | ICD-10-CM | POA: Diagnosis not present

## 2016-05-31 DIAGNOSIS — Z79899 Other long term (current) drug therapy: Secondary | ICD-10-CM | POA: Diagnosis not present

## 2016-05-31 DIAGNOSIS — R05 Cough: Secondary | ICD-10-CM

## 2016-05-31 DIAGNOSIS — H9202 Otalgia, left ear: Secondary | ICD-10-CM | POA: Diagnosis not present

## 2016-05-31 DIAGNOSIS — R059 Cough, unspecified: Secondary | ICD-10-CM

## 2016-05-31 DIAGNOSIS — E860 Dehydration: Secondary | ICD-10-CM

## 2016-05-31 DIAGNOSIS — R197 Diarrhea, unspecified: Secondary | ICD-10-CM | POA: Diagnosis not present

## 2016-05-31 LAB — CBC WITH DIFFERENTIAL/PLATELET
BASOS ABS: 0 10*3/uL (ref 0.0–0.1)
BASOS PCT: 0 %
Eosinophils Absolute: 0 10*3/uL (ref 0.0–0.7)
Eosinophils Relative: 1 %
HEMATOCRIT: 34.2 % — AB (ref 36.0–46.0)
HEMOGLOBIN: 10.8 g/dL — AB (ref 12.0–15.0)
LYMPHS PCT: 23 %
Lymphs Abs: 1.2 10*3/uL (ref 0.7–4.0)
MCH: 24.7 pg — ABNORMAL LOW (ref 26.0–34.0)
MCHC: 31.6 g/dL (ref 30.0–36.0)
MCV: 78.1 fL (ref 78.0–100.0)
Monocytes Absolute: 0.6 10*3/uL (ref 0.1–1.0)
Monocytes Relative: 12 %
NEUTROS ABS: 3.4 10*3/uL (ref 1.7–7.7)
NEUTROS PCT: 64 %
Platelets: 220 10*3/uL (ref 150–400)
RBC: 4.38 MIL/uL (ref 3.87–5.11)
RDW: 16.3 % — ABNORMAL HIGH (ref 11.5–15.5)
WBC: 5.3 10*3/uL (ref 4.0–10.5)

## 2016-05-31 LAB — URINALYSIS, ROUTINE W REFLEX MICROSCOPIC
Bilirubin Urine: NEGATIVE
GLUCOSE, UA: NEGATIVE mg/dL
KETONES UR: 15 mg/dL — AB
NITRITE: NEGATIVE
PH: 6 (ref 5.0–8.0)
PROTEIN: NEGATIVE mg/dL
Specific Gravity, Urine: 1.018 (ref 1.005–1.030)

## 2016-05-31 LAB — BASIC METABOLIC PANEL
ANION GAP: 8 (ref 5–15)
BUN: 10 mg/dL (ref 6–20)
CALCIUM: 8.5 mg/dL — AB (ref 8.9–10.3)
CO2: 22 mmol/L (ref 22–32)
Chloride: 102 mmol/L (ref 101–111)
Creatinine, Ser: 0.74 mg/dL (ref 0.44–1.00)
GFR calc non Af Amer: >60 mL/min (ref >60)
GLUCOSE: 101 mg/dL — AB (ref 65–99)
POTASSIUM: 3.3 mmol/L — AB (ref 3.5–5.1)
Sodium: 132 mmol/L — ABNORMAL LOW (ref 135–145)

## 2016-05-31 LAB — URINALYSIS, MICROSCOPIC (REFLEX)

## 2016-05-31 LAB — RAPID STREP SCREEN (MED CTR MEBANE ONLY): STREPTOCOCCUS, GROUP A SCREEN (DIRECT): NEGATIVE

## 2016-05-31 MED ORDER — ONDANSETRON HCL 4 MG/2ML IJ SOLN
4.0000 mg | Freq: Once | INTRAMUSCULAR | Status: AC
  Administered 2016-05-31: 4 mg via INTRAVENOUS
  Filled 2016-05-31: qty 2

## 2016-05-31 MED ORDER — DICYCLOMINE HCL 10 MG PO CAPS
20.0000 mg | ORAL_CAPSULE | Freq: Once | ORAL | Status: AC
  Administered 2016-05-31: 20 mg via ORAL
  Filled 2016-05-31: qty 2

## 2016-05-31 MED ORDER — ONDANSETRON 4 MG PO TBDP: 4.0000 mg | ORAL_TABLET | Freq: Three times a day (TID) | ORAL | 0 refills | Status: DC | PRN

## 2016-05-31 MED ORDER — DICYCLOMINE HCL 20 MG PO TABS: 20.0000 mg | ORAL_TABLET | Freq: Two times a day (BID) | ORAL | 0 refills | Status: DC

## 2016-05-31 MED ORDER — SODIUM CHLORIDE 0.9 % IV BOLUS (SEPSIS)
1000.0000 mL | Freq: Once | INTRAVENOUS | Status: AC
  Administered 2016-05-31: 1000 mL via INTRAVENOUS

## 2016-05-31 MED ORDER — POTASSIUM CHLORIDE CRYS ER 20 MEQ PO TBCR
40.0000 meq | EXTENDED_RELEASE_TABLET | Freq: Once | ORAL | Status: AC
  Administered 2016-05-31: 40 meq via ORAL
  Filled 2016-05-31: qty 2

## 2016-05-31 MED ORDER — BENZONATATE 100 MG PO CAPS: 100.0000 mg | ORAL_CAPSULE | Freq: Three times a day (TID) | ORAL | 0 refills | Status: DC

## 2016-05-31 NOTE — Progress Notes (Addendum)
Subjective:    Patient ID: Carrie Elliott, female    DOB: 07/20/1950, 66 y.o.   MRN: [id number]  HPI   Pt feels worse than the other day. Diarrhea is new. Has very sore throat. Very fatigued. Pt off balance. Hard to ambulate. Pt urine out put not much. Pt admits not eating or drinking very much. No appetite. Pt had 4-5 loose stools yesterday. 2 loose stools today. Pt had type A flu on Tuesday. On 3rd day of tamfilu.       Review of Systems  Constitutional: Positive for fatigue. Negative for chills and fever.  HENT: Positive for congestion, ear pain and sore throat.   Respiratory: Positive for cough. Negative for shortness of breath and wheezing.   Cardiovascular: Negative for chest pain and palpitations.  Gastrointestinal: Positive for abdominal pain, diarrhea and nausea. Negative for vomiting.       Mid diffuse.  Genitourinary:       Decreased out put.  Musculoskeletal: Negative for back pain and gait problem.  Skin: Negative for rash.  Neurological: Positive for weakness.       Light headed on ambulation. Unstable gait.  Hematological: Negative for adenopathy. Does not bruise/bleed easily.  Psychiatric/Behavioral: Negative for behavioral problems and confusion.   Past Medical History:  Diagnosis Date  . Allergic rhinitis   . Allergy   . Anemia 08/24/2014  . Arthritis    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, shoulders, knees  . Benign paroxysmal positional vertigo 07/12/2013  . Chicken pox   . Cystocele    grade 2  . Endometriosis    with Menometrorhaghia   . Gastric polyps 09/30/2015  . Gastroesophageal reflux disease with hiatal hernia    Cough Sees Dr Laretta Bolster   . GERD (gastroesophageal reflux disease)   . Hemorrhoids   . History of colonic polyps   . History of colonic polyps 09/30/2015   adenoma  . HTN (hypertension) 04/23/2013  . Hypocalcemia 09/30/2015  . IBS (irritable bowel syndrome)   . Measles   . Mumps   . Muscle spasm 09/30/2015  . Osteopenia 10/09/2015  . Rectocele    grade 2    . Reflux    cough  . Rosacea   . Sternal fracture    1986  . Sun-damaged skin 09/05/2014  . Vitamin D deficiency 04/06/2016  . 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 Marital status: Married    Spouse name: N/A  . Number of children: N/A  . Years of education: N/A   Occupational History  . Not on file.   Social History Main Topics  . Smoking status: Never Smoker  . Smokeless tobacco: Never Used  . Alcohol use No  . Drug use: No  . 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 Not on file   Social History Narrative  . No narrative on file    Past Surgical History:  Procedure Laterality Date  . APPENDECTOMY  1989  . atypical nevus excision    . CHOLECYSTECTOMY  1989  . COLONOSCOPY  2011  . COLONOSCOPY WITH PROPOFOL N/A 05/27/2015   Procedure: COLONOSCOPY WITH PROPOFOL;  Surgeon: Clarene Essex, MD;  Location: [facility];  Service: Endoscopy;  Laterality: N/A;  . CYSTOSCOPY W/ URETERAL STENT REMOVAL  1999  . ESOPHAGOGASTRODUODENOSCOPY  2011   with esophageal stent, perforation   . ESOPHAGOGASTRODUODENOSCOPY (  EGD) WITH PROPOFOL N/A 05/27/2015   Procedure: ESOPHAGOGASTRODUODENOSCOPY (EGD) WITH PROPOFOL;  Surgeon: Clarene Essex, MD;  Location: [facility];  Service: Endoscopy;  Laterality: N/A;  . HEMORRHOID SURGERY  2005  . LEFT OOPHORECTOMY  6295   complicated by ureteral injuery requiring a ureteral stent   . MMK / ANTERIOR VESICOURETHROPEXY / URETHROPEXY  2000  . TONSILLECTOMY AND ADENOIDECTOMY    . TOTAL ABDOMINAL HYSTERECTOMY  2000  . TUBAL LIGATION  1981  . wisdom teeth removal  1964    Family History  Problem Relation Age of Onset  . Colon polyps Father   . Hypertension Father   . Heart failure Father   . Melanoma Father     multiple  . Heart attack Father     x2  . Heart disease Father     Bundle Block  . Meniere's disease Father   . Cancer Father      melanoma  . Osteoporosis Mother   . Transient ischemic attack Mother   . Cancer Mother     colon  . Heart disease Mother     afib  . Hyperlipidemia Mother   . Diabetes Paternal Grandmother   . Stroke Maternal Grandmother   . Cancer Maternal Grandmother     skin, melanoma, sarcoma  . Colon polyps Maternal Grandmother   . Diverticulitis Maternal Grandmother   . Stroke Maternal Grandfather   . Heart disease Maternal Grandfather   . Obesity Brother     Allergies  Allergen Reactions  . Cefdinir     HIVES  . Celebrex [Celecoxib]     ABDOMINAL PAIN   . Ciprofloxacin     ABDOMINAL PAIN   . Codeine     NAUSEA VOMITING - can take with zofran  . Pseudoephedrine Hcl Er     TACHYCARDIA     Current Outpatient Prescriptions on File Prior to Visit  Medication Sig Dispense Refill  . acetaminophen (TYLENOL) 500 MG tablet Take 1,000 mg by mouth every 6 (six) hours as needed for headache (pain).    . benzocaine (ORAJEL) 10 % mucosal gel Use as directed 1 application in the mouth or throat as needed for mouth pain. Reported on 07/22/2015    . cholecalciferol (VITAMIN D) 1000 units tablet Take 1,000 Units by mouth daily.    Marland Kitchen esomeprazole (NEXIUM) 40 MG capsule Take 1 capsule (40 mg total) by mouth 2 (two) times daily before a meal. 180 capsule 3  . fexofenadine (ALLEGRA) 180 MG tablet Take 180 mg by mouth every morning.     . Lidocaine-Hydrocortisone Ace 3-0.5 % CREA Apply 1 application topically daily as needed (hemmoriods.).     Marland Kitchen meclizine (ANTIVERT) 25 MG tablet Take 1 tablet (25 mg total) by mouth 3 (three) times daily as needed for dizziness. 30 tablet 2  . metoprolol succinate (TOPROL-XL) 50 MG 24 hr tablet TAKE 1 TABLET (50 MG TOTAL) BY MOUTH DAILY. 90 tablet 3  . mometasone (NASONEX) 50 MCG/ACT nasal spray Place 1-2 sprays into the nose daily as needed. Reported on 07/22/2015 17 g 5  . nystatin ointment (MYCOSTATIN) Apply 1 application topically 2 (two) times daily. 30 g 1  .  ondansetron (ZOFRAN) 8 MG tablet Take 1 tablet (8 mg total) by mouth every 8 (eight) hours as needed for nausea or vomiting. 90 tablet 1  . oseltamivir (TAMIFLU) 75 MG capsule Take 1 capsule (75 mg total) by mouth 2 (two) times daily. 10 capsule 0  . promethazine-dextromethorphan (PROMETHAZINE-DM) 6.25-15 MG/5ML  syrup Take 5 mLs by mouth 4 (four) times daily as needed for cough. 118 mL 0  . ranitidine (ZANTAC) 150 MG tablet Take 150 mg by mouth 4 (four) times daily.    Marland Kitchen SIMPLY SALINE NA Place 1 each into the nose daily.      No current facility-administered medications on file prior to visit.     BP 136/84 (BP Location: Left Arm, Patient Position: Sitting, Cuff Size: Large)   Pulse 100   Temp 98.2 F (36.8 C) (Oral)   Resp 16   Ht 5\' 1"  (1.549 m)   Wt 165 lb 8 oz (75.1 kg)   SpO2 99%   BMI 31.27 kg/m       Objective:   Physical Exam  General  Mental Status - Alert. General Appearance - Well groomed. Not in acute distress.  Skin Rashes- No Rashes.  HEENT Head- Normal. Ear Auditory Canal - Left- Normal. Right - Normal.Tympanic Membrane- Left- mild dull. Right- Normal. Eye Sclera/Conjunctiva- Left- Normal. Right- Normal. Nose & Sinuses Nasal Mucosa- Left-  Boggy and Congested. Right-  Boggy and  Congested.Bilateral no  maxillary and no frontal  sinus pressure. Mouth & Throat Lips: Upper Lip- Normal: no dryness, cracking, pallor, cyanosis, or vesicular eruption. Lower Lip-Normal: no dryness, cracking, pallor, cyanosis or vesicular eruption. Buccal Mucosa- Bilateral- No Aphthous ulcers. Oropharynx- No Discharge or Erythema. Tonsils: Characteristics- Bilateral- mild  Erythema or Congestion. Size/Enlargement- Bilateral- No enlargement. Discharge- bilateral-None.  Neck Neck- Supple. No Masses.   Chest and Lung Exam Auscultation: Breath Sounds:-Clear even and unlabored.  Cardiovascular Auscultation:Rythm- Regular, rate and rhythm. Murmurs & Other Heart  Sounds:Ausculatation of the heart reveal- No Murmurs.  Lymphatic Head & Neck General Head & Neck Lymphatics: Bilateral: Description- No Localized lymphadenopathy.       Assessment & Plan:  Pt is concerned about her cost of insurance if she is not admitted would cost $400.(apologized that I had to send her to ED)  I want you evaluated by the ED now. I have concern for deydration and possible secondary infection post flu. Your severe fatigue, worsening condition, imbalance on ambulation and decreased  Urine out put make me think you would benefit from iv hydration and stat labs. Possible cxr as well.  I called ED MD and notified of your presentation.  Follow up her post ED evaluation.  Discussed with pt benefit of getting labs stat and assessing now  in EDas opposed to outpatient  Oral hydration and waiting potentially hours on lab results.

## 2016-05-31 NOTE — Discharge Instructions (Signed)
Your symptoms can all be a result of your diagnosed influenza infection. The chest x-ray and strep test were both negative for acute abnormality.  Viruses do not require antibiotics. Treatment is symptomatic care and it is important to note that these symptoms may last for 7-14 days.   Hand washing: Wash your hands throughout the day, but especially before and after touching the face, using the restroom, sneezing, coughing, or touching surfaces that have been coughed or sneezed upon. Hydration: Symptoms will be intensified and complicated by dehydration. Dehydration can also extend the duration of symptoms. Drink plenty of fluids and get plenty of rest. You should be drinking at least half a liter of water an hour to stay hydrated. Electrolyte drinks are also encouraged. You should be drinking enough fluids to make your urine light yellow, almost clear. If this is not the case, you are not drinking enough water. Please note that some of the treatments indicated below will not be effective if you are not adequately hydrated. Pain or fever: Ibuprofen, Naproxen, or Tylenol for pain or fever.  Nausea/vomiting: Use the Zofran for nausea or vomiting.  Cough: Use the Tessalon or the promethazine DM for cough. Use one of these medications, but not both. Congestion: Plain Mucinex may help relieve congestion. Saline sinus rinses and saline nasal sprays may also help relieve congestion.  Sore throat: Warm liquids or Chloraseptic spray may help soothe a sore throat. Gargle twice a day with a salt water solution made from a half teaspoon of salt in a cup of warm water.  Tamiflu: The abdominal discomfort and diarrhea may be due to the Tamiflu. If these symptoms are causing you more problems, you may choose to discontinue the Tamiflu.  Abdominal discomfort: The Bentyl can be useful for abdominal cramping or discomfort. Follow up: Follow up with a primary care provider, as needed, for any future management of this  issue.

## 2016-05-31 NOTE — Progress Notes (Signed)
Pre visit review using our clinic review tool, if applicable. No additional management support is needed unless otherwise documented below in the visit note/SLS  

## 2016-05-31 NOTE — Patient Instructions (Addendum)
I want you evaluated by the ED now. I have concern for deydration and possible secondary infection post flu. Your severe fatigue, worsening condition, imbalance on ambulation and decreased urine out put make me think you would benefit from iv hydration and stat labs. Possible cxr as well.  I called ED MD and notified of your presentation.  Follow up her post ED evaluation.

## 2016-05-31 NOTE — ED Provider Notes (Signed)
Patient feeling better after treatment. Tolerating po fluids. Discharge instructions and prescriptions per Marval Regal, PA provided. Follow-up with PCP.   Etta Quill, NP 05/31/16 1846    Leo Grosser, MD 06/01/16 [phone]

## 2016-05-31 NOTE — ED Triage Notes (Signed)
Diagnosed with flu 2 days ago. Nausea, cough and body aches. Was seen by her MD today and sent here.

## 2016-05-31 NOTE — ED Provider Notes (Signed)
[facility] Provider Note   CSN: [id number] Arrival date & time: 05/31/16  1256     History   Chief Complaint Chief Complaint  Patient presents with  . Influenza    HPI Carrie Elliott is a 66 y.o. female.  HPI   Carrie Elliott is a 66 y.o. female, with a history of HTN and GERD, presenting to the ED with nonproductive cough, body aches, sore throat, and nausea beginning 3 days ago. Was seen by her PCP two days ago, tested positive for Influenza A and negative for strep. Started Tamiflu at that time and then she developed diarrhea with 3 loose stools in the last 12 hours. Also endorses intermittent abdominal cramping and chest soreness with cough. Endorses fever with Tmax 101. Has tried promethazine-DM and tylenol. She took zofran last night, which helped her nausea. She states she has been trying to drink 4-6 oz of water per hour.   Went to her PCP office today and was sent down by the PA for labs, IV fluids, and further evaluation.   Denies vomiting, rash, neck stiffness, shortness of breath, hematochezia/melena, or any other complaints.      Past Medical History:  Diagnosis Date  . Allergic rhinitis   . Allergy   . Anemia 08/24/2014  . Arthritis    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, shoulders, knees  . Benign paroxysmal positional vertigo 07/12/2013  . Chicken pox   . Cystocele    grade 2  . Endometriosis    with Menometrorhaghia   . Gastric polyps 09/30/2015  . Gastroesophageal reflux disease with hiatal hernia    Cough Sees Dr Laretta Bolster   . GERD (gastroesophageal reflux disease)   . Hemorrhoids   . History of colonic polyps   . History of colonic polyps 09/30/2015   adenoma  . HTN (hypertension) 04/23/2013  . Hypocalcemia 09/30/2015  . IBS (irritable bowel syndrome)   . Measles   . Mumps   . Muscle spasm 09/30/2015  . Osteopenia 10/09/2015  . Rectocele    grade 2  . Reflux    cough  . Rosacea   . Sternal fracture    1986  . Sun-damaged skin 09/05/2014  . Vitamin D  deficiency 04/06/2016  . 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 Influenza 05/29/2016  . Vitamin D deficiency 04/06/2016  . Candidal dermatitis 04/06/2016  . Osteopenia 10/09/2015  . Hypocalcemia 09/30/2015  . 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 History of colonic polyps 09/30/2015  . Gastric polyps 09/30/2015  . Hip pain 09/05/2014  . Urinary frequency 09/05/2014  . Preventative health care 09/05/2014  . Sun-damaged skin 09/05/2014  . Anemia 08/24/2014  . Cervical cancer screening 08/24/2014  . Myalgia and myositis 02/11/2014  . Benign paroxysmal positional vertigo 07/12/2013  . History of chicken pox   . Chest pain 04/23/2013  . HTN (hypertension) 04/23/2013  . Headache(784.0) 04/23/2013  . Nodule of left lung 03/30/2011  . Allergic rhinitis   . Arthritis   . Gastroesophageal reflux disease with hiatal hernia   . IBS (irritable bowel syndrome)     Past Surgical History:  Procedure Laterality Date  . APPENDECTOMY  1989  . atypical nevus excision    . CHOLECYSTECTOMY  1989  . COLONOSCOPY  2011  . COLONOSCOPY WITH PROPOFOL N/A 05/27/2015   Procedure: COLONOSCOPY WITH PROPOFOL;  Surgeon: Clarene Essex, MD;  Location: [facility];  Service: Endoscopy;  Laterality: N/A;  .  CYSTOSCOPY W/ URETERAL STENT REMOVAL  1999  . ESOPHAGOGASTRODUODENOSCOPY  2011   with esophageal stent, perforation   . ESOPHAGOGASTRODUODENOSCOPY (EGD) WITH PROPOFOL N/A 05/27/2015   Procedure: ESOPHAGOGASTRODUODENOSCOPY (EGD) WITH PROPOFOL;  Surgeon: Clarene Essex, MD;  Location: [facility];  Service: Endoscopy;  Laterality: N/A;  . HEMORRHOID SURGERY  2005  . LEFT OOPHORECTOMY  6378   complicated by ureteral injuery requiring a ureteral stent   . MMK / ANTERIOR VESICOURETHROPEXY / URETHROPEXY  2000  . TONSILLECTOMY AND ADENOIDECTOMY    . TOTAL ABDOMINAL HYSTERECTOMY  2000  . TUBAL LIGATION  1981  . wisdom teeth removal  1964    OB History    No data available         Home Medications    Prior to Admission medications   Medication Sig Start Date End Date Taking? Authorizing Provider  acetaminophen (TYLENOL) 500 MG tablet Take 1,000 mg by mouth every 6 (six) hours as needed for headache (pain).    Historical Provider, MD  benzocaine (ORAJEL) 10 % mucosal gel Use as directed 1 application in the mouth or throat as needed for mouth pain. Reported on 07/22/2015    Historical Provider, MD  cholecalciferol (VITAMIN D) 1000 units tablet Take 1,000 Units by mouth daily.    Historical Provider, MD  esomeprazole (NEXIUM) 40 MG capsule Take 1 capsule (40 mg total) by mouth 2 (two) times daily before a meal. 10/03/15   Charlies Silvers, MD  fexofenadine (ALLEGRA) 180 MG tablet Take 180 mg by mouth every morning.     Historical Provider, MD  Lidocaine-Hydrocortisone Ace 3-0.5 % CREA Apply 1 application topically daily as needed (hemmoriods.).     Historical Provider, MD  meclizine (ANTIVERT) 25 MG tablet Take 1 tablet (25 mg total) by mouth 3 (three) times daily as needed for dizziness. 04/06/16   Mosie Lukes, MD  metoprolol succinate (TOPROL-XL) 50 MG 24 hr tablet TAKE 1 TABLET (50 MG TOTAL) BY MOUTH DAILY. 09/30/15   Mosie Lukes, MD  mometasone (NASONEX) 50 MCG/ACT nasal spray Place 1-2 sprays into the nose daily as needed. Reported on 07/22/2015 04/06/16   Mosie Lukes, MD  nystatin ointment (MYCOSTATIN) Apply 1 application topically 2 (two) times daily. 04/06/16   Mosie Lukes, MD  ondansetron (ZOFRAN) 8 MG tablet Take 1 tablet (8 mg total) by mouth every 8 (eight) hours as needed for nausea or vomiting. 04/21/15   Charlies Silvers, MD  oseltamivir (TAMIFLU) 75 MG capsule Take 1 capsule (75 mg total) by mouth 2 (two) times daily. 05/28/16   Adelina Mings, MD  promethazine-dextromethorphan (PROMETHAZINE-DM) 6.25-15 MG/5ML syrup Take 5 mLs by mouth 4 (four) times daily as needed for cough. 05/29/16   Rosalita Chessman Chase, DO  ranitidine (ZANTAC) 150 MG tablet Take  150 mg by mouth 4 (four) times daily.    Historical Provider, MD  SIMPLY SALINE NA Place 1 each into the nose daily.     Historical Provider, MD    Family History Family History  Problem Relation Age of Onset  . Colon polyps Father   . Hypertension Father   . Heart failure Father   . Melanoma Father     multiple  . Heart attack Father     x2  . Heart disease Father     Bundle Block  . Meniere's disease Father   . Cancer Father     melanoma  . Osteoporosis Mother   . Transient ischemic attack  Mother   . Cancer Mother     colon  . Heart disease Mother     afib  . Hyperlipidemia Mother   . Diabetes Paternal Grandmother   . Stroke Maternal Grandmother   . Cancer Maternal Grandmother     skin, melanoma, sarcoma  . Colon polyps Maternal Grandmother   . Diverticulitis Maternal Grandmother   . Stroke Maternal Grandfather   . Heart disease Maternal Grandfather   . Obesity Brother     Social History Social History  Substance Use Topics  . Smoking status: Never Smoker  . Smokeless tobacco: Never Used  . Alcohol use No     Allergies   Cefdinir; Celebrex [celecoxib]; Ciprofloxacin; Codeine; and Pseudoephedrine hcl er   Review of Systems Review of Systems  Constitutional: Positive for fever. Negative for chills.  Respiratory: Positive for cough. Negative for shortness of breath.   Cardiovascular: Negative for chest pain.  Gastrointestinal: Positive for diarrhea and nausea. Negative for vomiting.  Musculoskeletal:       Chest soreness with coughing  All other systems reviewed and are negative.    Physical Exam Updated Vital Signs BP 141/87   Pulse 102   Temp 99.6 F (37.6 C) (Oral)   Resp 16   SpO2 98%   Physical Exam  Constitutional: She appears well-developed and well-nourished. No distress.  HENT:  Head: Normocephalic and atraumatic.  Mouth/Throat: Uvula is midline. Posterior oropharyngeal edema and posterior oropharyngeal erythema present. No  oropharyngeal exudate.  Eyes: Conjunctivae are normal.  Neck: Neck supple.  Cardiovascular: Normal rate, regular rhythm, normal heart sounds and intact distal pulses.   Pulmonary/Chest: Effort normal and breath sounds normal. No respiratory distress.  Abdominal: Soft. There is no tenderness. There is no guarding.  Musculoskeletal: She exhibits no edema.  Lymphadenopathy:    She has no cervical adenopathy.  Neurological: She is alert.  Skin: Skin is warm and dry. She is not diaphoretic.  Psychiatric: She has a normal mood and affect. Her behavior is normal.  Nursing note and vitals reviewed.    ED Treatments / Results  Labs (all labs ordered are listed, but only abnormal results are displayed) Labs Reviewed  CBC WITH DIFFERENTIAL/PLATELET - Abnormal; Notable for the following:       Result Value   Hemoglobin 10.8 (*)    HCT 34.2 (*)    MCH 24.7 (*)    RDW 16.3 (*)    All other components within normal limits  BASIC METABOLIC PANEL - Abnormal; Notable for the following:    Sodium 132 (*)    Potassium 3.3 (*)    Glucose, Bld 101 (*)    Calcium 8.5 (*)    All other components within normal limits  URINALYSIS, ROUTINE W REFLEX MICROSCOPIC - Abnormal; Notable for the following:    Hgb urine dipstick TRACE (*)    Ketones, ur 15 (*)    Leukocytes, UA SMALL (*)    All other components within normal limits  URINALYSIS, MICROSCOPIC (REFLEX) - Abnormal; Notable for the following:    Bacteria, UA RARE (*)    Squamous Epithelial / LPF 0-5 (*)    All other components within normal limits  RAPID STREP SCREEN (NOT AT [facility])  CULTURE, GROUP A STREP [facility])   Hemoglobin  Date Value Ref Range Status  05/31/2016 10.8 (L) 12.0 - 15.0 g/dL Final  04/06/2016 11.1 (L) 12.0 - 15.0 g/dL Final  09/30/2015 11.0 (L) 12.0 - 15.0 g/dL Final  08/24/2014 12.3 12.0 - 15.0  g/dL Final   EKG  EKG Interpretation None       Radiology Dg Chest 2 View  Result Date: 05/31/2016 CLINICAL DATA:  Recent  diagnosis of flu, persistent cough EXAM: CHEST  2 VIEW COMPARISON:  Chest x-ray of 04/23/2013 FINDINGS: No active infiltrate or effusion is seen. Mediastinal and hilar contours are unremarkable. The heart is within normal limits in size. No bony abnormality is seen. IMPRESSION: No active cardiopulmonary disease. Electronically Signed   By: Ivar Drape M.D.   On: 05/31/2016 14:30    Procedures Procedures (including critical care time)  Medications Ordered in ED Medications  sodium chloride 0.9 % bolus 1,000 mL (0 mLs Intravenous Stopped 05/31/16 1530)  ondansetron (ZOFRAN) injection 4 mg (4 mg Intravenous Given 05/31/16 1415)  potassium chloride SA (K-DUR,KLOR-CON) CR tablet 40 mEq (40 mEq Oral Given 05/31/16 1516)  dicyclomine (BENTYL) capsule 20 mg (20 mg Oral Given 05/31/16 1515)  sodium chloride 0.9 % bolus 1,000 mL (1,000 mLs Intravenous New Bag/Given 05/31/16 1703)     Initial Impression / Assessment and Plan / ED Course  I have reviewed the triage vital signs and the nursing notes.  Pertinent labs & imaging results that were available during my care of the patient were reviewed by me and considered in my medical decision making (see chart for details).     Patient presents from her PCP office for labs and rehydration. Patient is nontoxic appearing and I doubt sepsis. Lab results support dehydration, which was addressed. Suspect patient's diarrhea and abdominal discomfort could be from the Tamiflu. This was discussed with the patient as well as the risks versus benefits of discontinuing the Tamiflu. Patient voiced significant improvement with IV fluids, Zofran, and Bentyl.   Findings and plan of care discussed with Brantley Stage, MD. Dr. Oleta Mouse personally evaluated and examined this patient.  End of shift patient care handoff report given to Etta Quill, NP. Plan: Finish IV fluids. PO challenge. Discharge home with PCP follow up.  Vitals:   05/31/16 1303 05/31/16 1522  BP: 141/87 116/62  Pulse:  102 86  Resp: 16 18  Temp: 99.6 F (37.6 C)   TempSrc: Oral   SpO2: 98% 98%     Final Clinical Impressions(s) / ED Diagnoses   Final diagnoses:  Influenza A    New Prescriptions New Prescriptions   No medications on file     Lorayne Bender, PA-C 05/31/16 [place] Liu, MD 06/01/16 1342

## 2016-06-02 LAB — CULTURE, GROUP A STREP

## 2016-06-03 LAB — CULTURE, GROUP A STREP (THRC)

## 2016-06-04 ENCOUNTER — Telehealth: Payer: Self-pay | Admitting: Family Medicine

## 2016-06-04 NOTE — Telephone Encounter (Signed)
Pt called in because she says that she is still not feeling better. Pt says that Dr. Etter Sjogren wrote her out of work until today. Pt says that she still don't feel any better. Pt is experiencing vomiting, headache and still a cough. Pt would like to have her work not extended and to be advised on what she should do further. Pt says that since seeing Dr. Etter Sjogren she has also came in to see Percell Miller.    Please advise pt  . Pt can be reached at phone # provided on file.

## 2016-06-05 ENCOUNTER — Ambulatory Visit (INDEPENDENT_AMBULATORY_CARE_PROVIDER_SITE_OTHER): Payer: 59 | Admitting: Medical

## 2016-06-05 ENCOUNTER — Encounter: Payer: Self-pay | Admitting: Medical

## 2016-06-05 VITALS — BP 146/85 | HR 72 | Temp 98.4°F | Resp 16 | Ht 61.0 in | Wt 165.2 lb

## 2016-06-05 DIAGNOSIS — J0110 Acute frontal sinusitis, unspecified: Secondary | ICD-10-CM | POA: Diagnosis not present

## 2016-06-05 DIAGNOSIS — R05 Cough: Secondary | ICD-10-CM

## 2016-06-05 DIAGNOSIS — R059 Cough, unspecified: Secondary | ICD-10-CM

## 2016-06-05 MED ORDER — AZITHROMYCIN 250 MG PO TABS
ORAL_TABLET | ORAL | 0 refills | Status: DC
Start: 2016-06-05 — End: 2016-11-23

## 2016-06-05 MED ORDER — PROMETHAZINE-DM 6.25-15 MG/5ML PO SYRP: 5.0000 mL | ORAL_SOLUTION | Freq: Four times a day (QID) | ORAL | 0 refills | Status: DC | PRN

## 2016-06-05 MED FILL — AZITHROMYCIN 250 MG TABLET: 250 | 5 days supply | Qty: 6 | Fill #0

## 2016-06-05 NOTE — Telephone Encounter (Signed)
Pt evaluated in office on 06/05/16, see note.

## 2016-06-05 NOTE — Progress Notes (Signed)
Pre visit review using our clinic review tool, if applicable. No additional management support is needed unless otherwise documented below in the visit note/SLS  

## 2016-06-05 NOTE — Patient Instructions (Addendum)
You overall do appear better post flu. But at this point I have concern for secondary sinus infection and possible mild bronchitis.  Will rx azithromycin antibiotic and I want you also to use a probiotic otc. If you were to developed watery stools with antibiotic then notify us asap and will place order for stool panel kit/c dif test. But I thinks benefit of antibiotic does outweigh risk.  Bland food and hydrate well.  Work note return on Tuesday. If not feeling up to your regular duty by then could fill out fmla type form. Maybe reduced hours at that point if needed.  For cough refilling your phenergan DM syrup.  Follow up as needed

## 2016-06-05 NOTE — Progress Notes (Signed)
Subjective:    Patient ID: Carrie Elliott, female    DOB: 1950-07-18, 66 y.o.   MRN: [id number]  HPI  Pt in for evaluation.  Pt feels better overall. Pt still has sinus pressure. She still is coughing. Some coloring to mucous. Some mucous when she coughs but she thinks may be sinus related darinage. Pt has been using promethazine- dm. Cough not disrupted her sleep  too much. Recently able to sleep well with little to no cough.  Pt states cough was keeping her up until 2 nights ago.   Pt was given IV fluids in ED 2 tabs. Pt did take full course tamiflu.  Pt still has some fatigue but less than before. Nausea and vomiting stopped since Sunday. Pt states her loose stool have eased up. Today if first formed stool. Pt throat still faintly sore. Pt throat culture came back negative.      Review of Systems  Constitutional: Positive for fatigue. Negative for chills and fever.  HENT: Positive for congestion, sinus pain and sinus pressure. Negative for sneezing, sore throat and trouble swallowing.   Respiratory: Positive for cough. Negative for shortness of breath and wheezing.   Cardiovascular: Negative for chest pain.  Gastrointestinal: Negative for abdominal pain, nausea and vomiting.       No nausea or vomiting since Sunday.  Musculoskeletal: Negative for back pain, myalgias and neck pain.  Neurological: Negative for dizziness, numbness and headaches.  Hematological: Negative for adenopathy. Does not bruise/bleed easily.  Psychiatric/Behavioral: Negative for behavioral problems and confusion.    Past Medical History:  Diagnosis Date  . Allergic rhinitis   . Allergy   . Anemia 08/24/2014  . Arthritis    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, shoulders, knees  . Benign paroxysmal positional vertigo 07/12/2013  . Chicken pox   . Cystocele    grade 2  . Endometriosis    with Menometrorhaghia   . Gastric polyps 09/30/2015  . Gastroesophageal reflux disease with hiatal hernia    Cough Sees Dr Laretta Bolster   .  GERD (gastroesophageal reflux disease)   . Hemorrhoids   . History of colonic polyps   . History of colonic polyps 09/30/2015   adenoma  . HTN (hypertension) 04/23/2013  . Hypocalcemia 09/30/2015  . IBS (irritable bowel syndrome)   . Measles   . Mumps   . Muscle spasm 09/30/2015  . Osteopenia 10/09/2015  . Rectocele    grade 2  . Reflux    cough  . Rosacea   . Sternal fracture    1986  . Sun-damaged skin 09/05/2014  . Vitamin D deficiency 04/06/2016  . 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 Marital status: Married    Spouse name: N/A  . Number of children: N/A  . Years of education: N/A   Occupational History  . Not on file.   Social History Main Topics  . Smoking status: Never Smoker  . Smokeless tobacco: Never Used  . Alcohol use No  . Drug use: No  . 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 Not on file   Social History Narrative  . No narrative on file    Past Surgical History:  Procedure Laterality Date  . APPENDECTOMY  1989  . atypical nevus excision    . CHOLECYSTECTOMY  1989  . COLONOSCOPY  2011  . COLONOSCOPY  WITH PROPOFOL N/A 05/27/2015   Procedure: COLONOSCOPY WITH PROPOFOL;  Surgeon: Clarene Essex, MD;  Location: [facility];  Service: Endoscopy;  Laterality: N/A;  . CYSTOSCOPY W/ URETERAL STENT REMOVAL  1999  . ESOPHAGOGASTRODUODENOSCOPY  2011   with esophageal stent, perforation   . ESOPHAGOGASTRODUODENOSCOPY (EGD) WITH PROPOFOL N/A 05/27/2015   Procedure: ESOPHAGOGASTRODUODENOSCOPY (EGD) WITH PROPOFOL;  Surgeon: Clarene Essex, MD;  Location: [facility];  Service: Endoscopy;  Laterality: N/A;  . HEMORRHOID SURGERY  2005  . LEFT OOPHORECTOMY  1610   complicated by ureteral injuery requiring a ureteral stent   . MMK / ANTERIOR VESICOURETHROPEXY / URETHROPEXY  2000  . TONSILLECTOMY AND ADENOIDECTOMY    . TOTAL ABDOMINAL HYSTERECTOMY  2000  .  TUBAL LIGATION  1981  . wisdom teeth removal  1964    Family History  Problem Relation Age of Onset  . Colon polyps Father   . Hypertension Father   . Heart failure Father   . Melanoma Father     multiple  . Heart attack Father     x2  . Heart disease Father     Bundle Block  . Meniere's disease Father   . Cancer Father     melanoma  . Osteoporosis Mother   . Transient ischemic attack Mother   . Cancer Mother     colon  . Heart disease Mother     afib  . Hyperlipidemia Mother   . Diabetes Paternal Grandmother   . Stroke Maternal Grandmother   . Cancer Maternal Grandmother     skin, melanoma, sarcoma  . Colon polyps Maternal Grandmother   . Diverticulitis Maternal Grandmother   . Stroke Maternal Grandfather   . Heart disease Maternal Grandfather   . Obesity Brother     Allergies  Allergen Reactions  . Cefdinir     HIVES  . Celebrex [Celecoxib]     ABDOMINAL PAIN   . Ciprofloxacin     ABDOMINAL PAIN   . Codeine     NAUSEA VOMITING - can take with zofran  . Pseudoephedrine Hcl Er     TACHYCARDIA     Current Outpatient Prescriptions on File Prior to Visit  Medication Sig Dispense Refill  . acetaminophen (TYLENOL) 500 MG tablet Take 1,000 mg by mouth every 6 (six) hours as needed for headache (pain).    . benzocaine (ORAJEL) 10 % mucosal gel Use as directed 1 application in the mouth or throat as needed for mouth pain. Reported on 07/22/2015    . cholecalciferol (VITAMIN D) 1000 units tablet Take 1,000 Units by mouth daily.    Marland Kitchen esomeprazole (NEXIUM) 40 MG capsule Take 1 capsule (40 mg total) by mouth 2 (two) times daily before a meal. 180 capsule 3  . fexofenadine (ALLEGRA) 180 MG tablet Take 180 mg by mouth every morning.     . Lidocaine-Hydrocortisone Ace 3-0.5 % CREA Apply 1 application topically daily as needed (hemmoriods.).     Marland Kitchen meclizine (ANTIVERT) 25 MG tablet Take 1 tablet (25 mg total) by mouth 3 (three) times daily as needed for dizziness. 30 tablet 2    . metoprolol succinate (TOPROL-XL) 50 MG 24 hr tablet TAKE 1 TABLET (50 MG TOTAL) BY MOUTH DAILY. 90 tablet 3  . mometasone (NASONEX) 50 MCG/ACT nasal spray Place 1-2 sprays into the nose daily as needed. Reported on 07/22/2015 17 g 5  . nystatin ointment (MYCOSTATIN) Apply 1 application topically 2 (two) times daily. 30 g 1  . ondansetron (ZOFRAN) 8 MG  tablet Take 1 tablet (8 mg total) by mouth every 8 (eight) hours as needed for nausea or vomiting. 90 tablet 1  . ranitidine (ZANTAC) 150 MG tablet Take 150 mg by mouth 4 (four) times daily.    Marland Kitchen SIMPLY SALINE NA Place 1 each into the nose daily.     . benzonatate (TESSALON) 100 MG capsule Take 1 capsule (100 mg total) by mouth every 8 (eight) hours. (Patient not taking: Reported on 06/05/2016) 21 capsule 0  . dicyclomine (BENTYL) 20 MG tablet Take 1 tablet (20 mg total) by mouth 2 (two) times daily. (Patient not taking: Reported on 06/05/2016) 20 tablet 0  . ondansetron (ZOFRAN ODT) 4 MG disintegrating tablet Take 1 tablet (4 mg total) by mouth every 8 (eight) hours as needed for nausea or vomiting. (Patient not taking: Reported on 06/05/2016) 20 tablet 0   No current facility-administered medications on file prior to visit.     BP (!) 146/85 (BP Location: Left Arm, Patient Position: Sitting, Cuff Size: Large)   Pulse 72   Temp 98.4 F (36.9 C) (Oral)   Resp 16   Ht 5' 1"  (1.549 m)   Wt 165 lb 4 oz (75 kg)   SpO2 100%   BMI 31.22 kg/m      Objective:   Physical Exam  General  Mental Status - Alert. General Appearance - Well groomed. Not in acute distress.  Skin Rashes- No Rashes.  HEENT Head- Normal. Ear Auditory Canal - Left- Normal. Right - Normal.Tympanic Membrane- Left- Normal. Right- Normal. Eye Sclera/Conjunctiva- Left- Normal. Right- Normal. Nose & Sinuses Nasal Mucosa- Left-  Boggy and Congested. Right-  Boggy and  Congested.Bilateral  No maxillary but ethmoid and frontal sinus pressure. Mouth & Throat Lips: Upper Lip-  Normal: no dryness, cracking, pallor, cyanosis, or vesicular eruption. Lower Lip-Normal: no dryness, cracking, pallor, cyanosis or vesicular eruption. Buccal Mucosa- Bilateral- No Aphthous ulcers. Oropharynx- No Discharge or Erythema. Tonsils: Characteristics- Bilateral- No Erythema or Congestion. Size/Enlargement- Bilateral- No enlargement. Discharge- bilateral-None.  Neck Neck- Supple. No Masses.   Chest and Lung Exam Auscultation: Breath Sounds:-Clear even and unlabored.  Cardiovascular Auscultation:Rythm- Regular, rate and rhythm. Murmurs & Other Heart Sounds:Ausculatation of the heart reveal- No Murmurs.  Lymphatic Head & Neck General Head & Neck Lymphatics: Bilateral: Description- No Localized lymphadenopathy.   Abdomen Inspection:-Inspection Normal.  Palpation/Perucssion: Palpation and Percussion of the abdomen reveal- Non Tender, No Rebound tenderness, No rigidity(Guarding) and No Palpable abdominal masses.  Liver:-Normal.  Spleen:- Normal.   Back- no cva tenderness.        Assessment & Plan:  You overall do appear better post flu. But at this point I have concern for secondary sinus infection and possible mild bronchitis.  Will rx azithromycin antibiotic and I want you also to use a probiotic otc. If you were to developed watery stools with antibiotic then notify us asap and will place order for stool panel kit/c dif test. But I thinks benefit of antibiotic does outweigh risk.  Bland food and hydrate well.  Work note return on Tuesday. If not feeling up to your regular duty by then could fill out fmla type form. Maybe reduced hours at that point if needed.  For cough refilling your phenergan DM syrup.  Follow up as needed  Eisen Robenson, Percell Miller, PA-C

## 2016-06-10 ENCOUNTER — Encounter: Payer: Self-pay | Admitting: Family Medicine

## 2016-06-10 ENCOUNTER — Encounter: Payer: Self-pay | Admitting: Medical

## 2016-06-11 ENCOUNTER — Encounter: Payer: Self-pay | Admitting: Behavioral Health

## 2016-06-11 DIAGNOSIS — Z0279 Encounter for issue of other medical certificate: Secondary | ICD-10-CM

## 2016-06-11 NOTE — Telephone Encounter (Signed)
05/29/16 thru 06/04/16 was under Dr. Nonda Lou orders [per letter written]/SLS 03/19

## 2016-06-11 NOTE — Telephone Encounter (Signed)
Pt brought in document ([employer] Document) For provider to fill out and wanting to have documents to be fax tel [phone]. Document given to [facility] ([place] for Percell Miller today 06-11-2016).

## 2016-06-11 NOTE — Telephone Encounter (Signed)
I have not received any forms--did edward?

## 2016-06-12 NOTE — Telephone Encounter (Signed)
Patient brought in FMLA forms to the office on 06/11/16. Forms were given to Mackie Pai, PA-C at that time for completion.

## 2016-06-13 ENCOUNTER — Encounter: Payer: Self-pay | Admitting: Medical

## 2016-06-19 ENCOUNTER — Telehealth: Payer: Self-pay | Admitting: *Deleted

## 2016-06-19 ENCOUNTER — Telehealth: Payer: Self-pay

## 2016-06-19 NOTE — Telephone Encounter (Signed)
Dr. Charlett Blake and Mackie Pai have filled out patients FMLA form. Forms have been faxed over to [phone], with transmission log. Patient was informed of paperwork being faxed she voiced her understanding.  PC

## 2016-06-19 NOTE — Telephone Encounter (Signed)
Received completed and signed FMLA paperwork form Carrie Elliott.  All forms faxed to [employer] at [phone]).  Confirmation received.  Sent original forms to be scanned into the chart.//AB/CMA

## 2016-06-26 MED FILL — MOMETASONE FUROATE 50 MCG S: 50 | 90 days supply | Qty: 51 | Fill #0

## 2016-08-12 ENCOUNTER — Encounter: Payer: Self-pay | Admitting: Medical

## 2016-08-13 ENCOUNTER — Telehealth: Payer: Self-pay | Admitting: Medical

## 2016-08-13 NOTE — Telephone Encounter (Signed)
Saw Dr Carollee Herter 05/29/16, type A flu given Tamiflu.  Saw you 05/31/16, had diarrhea, N&V, worsening chest cough, weak, dizzy, BP up, pulse high. After exam your directions included blood tests, chest x-ray & IV fluids to prevent worsening of dehydration.  We discussed my concern for treatment in ED. You expressed the need to know if lungs were clear, labs were normal and that I would not get better until I had at least a bag of fluids.  I went to the ED as directed, took 4 sticks to get an IV line, "dehydration" - given 2 liters of fluids.  I developed a secondary infection & possible bronchitis, seen 06/05/16.  I was out of work from 3/6-3/20.  I do not know how your bill or the ED bill was coded or what the medical notes recorded but I was billed for a non emergency copay of $400 by insurance for the ED visit on 05/31/16.  Can you please help me with a letter, records or information that will justify the need for the emergency ED care on 05/31/16?  I am appealing the claim decision.   Above is my chart note patient sent me. She is contesting the fact that insurance charging her non emergency visit. She was sent to 2 days after the flu diagnosis. She may need copy of our visit notes to help support her appeal. Not sure exactly what she wants.   With her age, diagnosis of flu,  worsening condition overall and needs for hydration as well as further work up ED was reasonable/indicated. I have discussed with Dr. Yancey Flemings this case just yesterday.

## 2016-08-14 MED FILL — METOPROLOL SUCC ER 50 MG TA: 50 | 90 days supply | Qty: 90 | Fill #3

## 2016-08-14 NOTE — Telephone Encounter (Signed)
I talked to patients spouse and provided him a copy of the office visit.

## 2016-08-15 ENCOUNTER — Telehealth: Payer: Self-pay | Admitting: Medical

## 2016-08-15 NOTE — Telephone Encounter (Signed)
I correctly some typographical errors on 05-31-2016. Corrections pt asked me to make. So can you reprint the note and give pt copy.

## 2016-08-16 NOTE — Telephone Encounter (Signed)
Notes printed and patient informed

## 2016-08-31 DIAGNOSIS — K219 Gastro-esophageal reflux disease without esophagitis: Secondary | ICD-10-CM | POA: Diagnosis not present

## 2016-08-31 DIAGNOSIS — Z86010 Personal history of colonic polyps: Secondary | ICD-10-CM | POA: Diagnosis not present

## 2016-08-31 MED FILL — raNITIdine HCL 150 MG TABS: 150 | 90 days supply | Qty: 360 | Fill #0

## 2016-08-31 MED FILL — ESOMEPRAZOLE MAGNESIUM 40 M: 40 | 30 days supply | Qty: 30 | Fill #0

## 2016-09-21 ENCOUNTER — Telehealth: Payer: Self-pay | Admitting: Family Medicine

## 2016-09-21 NOTE — Telephone Encounter (Signed)
Caller name:Adalae Stockburger Relationship to patient: Can be reached: Pharmacy:  Reason for call:recieved a bill for $20 DOS 06/11/16 for paperwork/form. Patient is requesting to know what the form was for.

## 2016-09-21 NOTE — Telephone Encounter (Signed)
Are you able to see what charges are for?

## 2016-09-21 NOTE — Telephone Encounter (Signed)
Call back [phone] or [phone] I do see note for work excuse, patient wants to know why she was charge for that.

## 2016-10-02 NOTE — Telephone Encounter (Signed)
The charge was for her [employer] forms to be completed when she was out of work, looks like they are scanned in under Media in case she has any other questions.

## 2016-10-02 NOTE — Telephone Encounter (Signed)
Spoke to spouse he will let patient know

## 2016-10-08 MED FILL — LIDOCAINE-HC 3-0.5% CRM KIT: 3-0.5 | 30 days supply | Qty: 98 | Fill #0

## 2016-10-12 ENCOUNTER — Encounter: Payer: 59 | Admitting: Family Medicine

## 2016-10-19 DIAGNOSIS — H52203 Unspecified astigmatism, bilateral: Secondary | ICD-10-CM | POA: Diagnosis not present

## 2016-10-29 ENCOUNTER — Other Ambulatory Visit: Payer: Self-pay

## 2016-10-29 ENCOUNTER — Other Ambulatory Visit: Payer: Self-pay | Admitting: Family Medicine

## 2016-10-29 MED FILL — MOMETASONE FUROATE 50 MCG S: 50 | 90 days supply | Qty: 51 | Fill #1

## 2016-10-29 MED FILL — ESOMEPRAZOLE MAGNESIUM 40 M: 40 | 30 days supply | Qty: 30 | Fill #1

## 2016-10-29 MED FILL — METOPROLOL SUCC ER 50 MG TA: 50 | 90 days supply | Qty: 90 | Fill #0

## 2016-11-23 ENCOUNTER — Ambulatory Visit (INDEPENDENT_AMBULATORY_CARE_PROVIDER_SITE_OTHER): Payer: 59 | Admitting: Family Medicine

## 2016-11-23 ENCOUNTER — Encounter: Payer: Self-pay | Admitting: Family Medicine

## 2016-11-23 VITALS — BP 134/78 | HR 80 | Temp 97.9°F | Ht 61.0 in | Wt 163.8 lb

## 2016-11-23 DIAGNOSIS — E785 Hyperlipidemia, unspecified: Secondary | ICD-10-CM | POA: Diagnosis not present

## 2016-11-23 DIAGNOSIS — K219 Gastro-esophageal reflux disease without esophagitis: Secondary | ICD-10-CM

## 2016-11-23 DIAGNOSIS — K449 Diaphragmatic hernia without obstruction or gangrene: Secondary | ICD-10-CM

## 2016-11-23 DIAGNOSIS — E559 Vitamin D deficiency, unspecified: Secondary | ICD-10-CM

## 2016-11-23 DIAGNOSIS — D649 Anemia, unspecified: Secondary | ICD-10-CM | POA: Diagnosis not present

## 2016-11-23 DIAGNOSIS — I10 Essential (primary) hypertension: Secondary | ICD-10-CM

## 2016-11-23 DIAGNOSIS — J309 Allergic rhinitis, unspecified: Secondary | ICD-10-CM

## 2016-11-23 DIAGNOSIS — Z1231 Encounter for screening mammogram for malignant neoplasm of breast: Secondary | ICD-10-CM | POA: Diagnosis not present

## 2016-11-23 DIAGNOSIS — Z23 Encounter for immunization: Secondary | ICD-10-CM

## 2016-11-23 DIAGNOSIS — Z0000 Encounter for general adult medical examination without abnormal findings: Secondary | ICD-10-CM

## 2016-11-23 DIAGNOSIS — Z8619 Personal history of other infectious and parasitic diseases: Secondary | ICD-10-CM

## 2016-11-23 DIAGNOSIS — E871 Hypo-osmolality and hyponatremia: Secondary | ICD-10-CM | POA: Insufficient documentation

## 2016-11-23 DIAGNOSIS — R42 Dizziness and giddiness: Secondary | ICD-10-CM | POA: Insufficient documentation

## 2016-11-23 DIAGNOSIS — Z1239 Encounter for other screening for malignant neoplasm of breast: Secondary | ICD-10-CM

## 2016-11-23 HISTORY — DX: Hypo-osmolality and hyponatremia: E87.1

## 2016-11-23 HISTORY — DX: Dizziness and giddiness: R42

## 2016-11-23 LAB — COMPREHENSIVE METABOLIC PANEL
ALBUMIN: 4 g/dL (ref 3.5–5.2)
ALT: 13 U/L (ref 0–35)
AST: 14 U/L (ref 0–37)
Alkaline Phosphatase: 52 U/L (ref 39–117)
BUN: 12 mg/dL (ref 6–23)
CHLORIDE: 103 meq/L (ref 96–112)
CO2: 27 mEq/L (ref 19–32)
CREATININE: 0.73 mg/dL (ref 0.40–1.20)
Calcium: 9.3 mg/dL (ref 8.4–10.5)
GFR: 84.73 mL/min (ref >60.00)
Glucose, Bld: 89 mg/dL (ref 70–99)
Potassium: 3.9 mEq/L (ref 3.5–5.1)
SODIUM: 137 meq/L (ref 135–145)
TOTAL PROTEIN: 7 g/dL (ref 6.0–8.3)
Total Bilirubin: 0.4 mg/dL (ref 0.2–1.2)

## 2016-11-23 LAB — CBC WITH DIFFERENTIAL/PLATELET
BASOS ABS: 0 10*3/uL (ref 0.0–0.1)
Basophils Relative: 0.9 % (ref 0.0–3.0)
EOS ABS: 0.1 10*3/uL (ref 0.0–0.7)
Eosinophils Relative: 1.5 % (ref 0.0–5.0)
HCT: 34.3 % — ABNORMAL LOW (ref 36.0–46.0)
HEMOGLOBIN: 11.1 g/dL — AB (ref 12.0–15.0)
Lymphocytes Relative: 27.8 % (ref 12.0–46.0)
Lymphs Abs: 1.4 10*3/uL (ref 0.7–4.0)
MCHC: 32.4 g/dL (ref 30.0–36.0)
MCV: 80.4 fl (ref 78.0–100.0)
Monocytes Absolute: 0.4 10*3/uL (ref 0.1–1.0)
Monocytes Relative: 8.8 % (ref 3.0–12.0)
Neutro Abs: 3 10*3/uL (ref 1.4–7.7)
Neutrophils Relative %: 61 % (ref 43.0–77.0)
Platelets: 256 10*3/uL (ref 150.0–400.0)
RBC: 4.27 Mil/uL (ref 3.87–5.11)
RDW: 17.4 % — AB (ref 11.5–15.5)
WBC: 4.9 10*3/uL (ref 4.0–10.5)

## 2016-11-23 LAB — LIPID PANEL
CHOLESTEROL: 193 mg/dL (ref 0–200)
HDL: 61.6 mg/dL (ref >39.00)
LDL CALC: 109 mg/dL — AB (ref 0–99)
NonHDL: 131.61
Total CHOL/HDL Ratio: 3
Triglycerides: 113 mg/dL (ref 0.0–149.0)
VLDL: 22.6 mg/dL (ref 0.0–40.0)

## 2016-11-23 LAB — TSH: TSH: 0.74 u[IU]/mL (ref 0.35–4.50)

## 2016-11-23 MED ORDER — MOMETASONE FUROATE 50 MCG/ACT NA SUSP: 1.0000 | Freq: Every day | NASAL | 5 refills | Status: DC | PRN

## 2016-11-23 NOTE — Assessment & Plan Note (Signed)
Well controlled, no changes to meds. Encouraged heart healthy diet such as the DASH diet and exercise as tolerated.  °

## 2016-11-23 NOTE — Assessment & Plan Note (Signed)
Maintain adequate hydration. Consider vestibular rehab if worsens

## 2016-11-23 NOTE — Progress Notes (Signed)
Pre visit review using our clinic review tool, if applicable. No additional management support is needed unless otherwise documented below in the visit note. 

## 2016-11-23 NOTE — Progress Notes (Signed)
Patient ID: Carrie Elliott, female   DOB: [birth year], 66 y.o.   MRN: [id number]   Subjective:    Patient ID: Carrie Elliott, female    DOB: 06-14-1950, 66 y.o.   MRN: [id number]  Chief Complaint  Patient presents with  . Annual Exam    HPI Patient is in today for annual preventative exam and follow up on hypertension, vitamin D deficiency, reflux and vitamin D deficiency. No recent febrile illness or hospitalization. No polyuria or polydipsia. Denies CP/palp/SOB/HA/congestion/fevers/GI or GU c/o. Taking meds as prescribed. Is doing well with ADLs at home. Tries to maintain a heart healthy diet and stay active Past Medical History:  Diagnosis Date  . Allergic rhinitis   . Allergy   . Anemia 08/24/2014  . Arthritis    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, shoulders, knees  . Benign paroxysmal positional vertigo 07/12/2013  . Chicken pox   . Cystocele    grade 2  . Endometriosis    with Menometrorhaghia   . Gastric polyps 09/30/2015  . Gastroesophageal reflux disease with hiatal hernia    Cough Sees Dr Laretta Bolster   . GERD (gastroesophageal reflux disease)   . Hemorrhoids   . History of colonic polyps   . History of colonic polyps 09/30/2015   adenoma  . HTN (hypertension) 04/23/2013  . Hypocalcemia 09/30/2015  . Hyponatremia 11/23/2016  . IBS (irritable bowel syndrome)   . Measles   . Mumps   . Muscle spasm 09/30/2015  . Osteopenia 10/09/2015  . Rectocele    grade 2  . Reflux    cough  . Rosacea   . Sternal fracture    1986  . Sun-damaged skin 09/05/2014  . Vertigo 11/23/2016  . Vitamin D deficiency 04/06/2016  . 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 APPENDECTOMY  1989  . atypical nevus excision    . CHOLECYSTECTOMY  1989  . COLONOSCOPY  2011  . COLONOSCOPY WITH PROPOFOL N/A 05/27/2015   Procedure: COLONOSCOPY WITH PROPOFOL;  Surgeon: Clarene Essex, MD;  Location: [facility];  Service: Endoscopy;  Laterality: N/A;  . CYSTOSCOPY W/ URETERAL STENT REMOVAL  1999  .  ESOPHAGOGASTRODUODENOSCOPY  2011   with esophageal stent, perforation   . ESOPHAGOGASTRODUODENOSCOPY (EGD) WITH PROPOFOL N/A 05/27/2015   Procedure: ESOPHAGOGASTRODUODENOSCOPY (EGD) WITH PROPOFOL;  Surgeon: Clarene Essex, MD;  Location: [facility];  Service: Endoscopy;  Laterality: N/A;  . HEMORRHOID SURGERY  2005  . LEFT OOPHORECTOMY  1950   complicated by ureteral injuery requiring a ureteral stent   . MMK / ANTERIOR VESICOURETHROPEXY / URETHROPEXY  2000  . TONSILLECTOMY AND ADENOIDECTOMY    . TOTAL ABDOMINAL HYSTERECTOMY  2000  . TUBAL LIGATION  1981  . wisdom teeth removal  1964    Family History  Problem Relation Age of Onset  . Colon polyps Father   . Hypertension Father   . Heart failure Father   . Melanoma Father        multiple  . Heart attack Father        x2  . Heart disease Father        Bundle Block  . Meniere's disease Father   . Cancer Father        melanoma  . Osteoporosis Mother   . Transient ischemic attack Mother   . Cancer Mother        colon  . Heart disease Mother        afib  .  Hyperlipidemia Mother   . Diabetes Paternal Grandmother   . Stroke Maternal Grandmother   . Cancer Maternal Grandmother        skin, melanoma, sarcoma  . Colon polyps Maternal Grandmother   . Diverticulitis Maternal Grandmother   . Stroke Maternal Grandfather   . Heart disease Maternal Grandfather   . Obesity Brother     Social History   Social History  . Marital status: Married    Spouse name: N/A  . Number of children: N/A  . Years of education: N/A   Occupational History  . Not on file.   Social History Main Topics  . Smoking status: Never Smoker  . Smokeless tobacco: Never Used  . Alcohol use No  . Drug use: No  . 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 Not on file   Social History Narrative  . No narrative on file    Outpatient Medications Prior to Visit    Medication Sig Dispense Refill  . acetaminophen (TYLENOL) 500 MG tablet Take 1,000 mg by mouth every 6 (six) hours as needed for headache (pain).    . benzocaine (ORAJEL) 10 % mucosal gel Use as directed 1 application in the mouth or throat as needed for mouth pain. Reported on 07/22/2015    . cholecalciferol (VITAMIN D) 1000 units tablet Take 1,000 Units by mouth daily.    Marland Kitchen esomeprazole (NEXIUM) 40 MG capsule Take 1 capsule (40 mg total) by mouth 2 (two) times daily before a meal. 180 capsule 3  . fexofenadine (ALLEGRA) 180 MG tablet Take 180 mg by mouth every morning.     . Lidocaine-Hydrocortisone Ace 3-0.5 % CREA Apply 1 application topically daily as needed (hemmoriods.).     Marland Kitchen meclizine (ANTIVERT) 25 MG tablet Take 1 tablet (25 mg total) by mouth 3 (three) times daily as needed for dizziness. 30 tablet 2  . metoprolol succinate (TOPROL-XL) 50 MG 24 hr tablet TAKE 1 TABLET BY MOUTH DAILY 90 tablet 3  . ondansetron (ZOFRAN) 8 MG tablet Take 1 tablet (8 mg total) by mouth every 8 (eight) hours as needed for nausea or vomiting. 90 tablet 1  . ranitidine (ZANTAC) 150 MG tablet Take 150 mg by mouth 4 (four) times daily.    Marland Kitchen SIMPLY SALINE NA Place 1 each into the nose daily.     Marland Kitchen azithromycin (ZITHROMAX) 250 MG tablet Take 2 tablets by mouth on day 1, followed by 1 tablet by mouth daily for 4 days. 6 tablet 0  . benzonatate (TESSALON) 100 MG capsule Take 1 capsule (100 mg total) by mouth every 8 (eight) hours. 21 capsule 0  . dicyclomine (BENTYL) 20 MG tablet Take 1 tablet (20 mg total) by mouth 2 (two) times daily. 20 tablet 0  . mometasone (NASONEX) 50 MCG/ACT nasal spray Place 1-2 sprays into the nose daily as needed. Reported on 07/22/2015 17 g 5  . nystatin ointment (MYCOSTATIN) Apply 1 application topically 2 (two) times daily. 30 g 1  . ondansetron (ZOFRAN ODT) 4 MG disintegrating tablet Take 1 tablet (4 mg total) by mouth every 8 (eight) hours as needed for nausea or vomiting. 20 tablet 0   . promethazine-dextromethorphan (PROMETHAZINE-DM) 6.25-15 MG/5ML syrup Take 5 mLs by mouth 4 (four) times daily as needed for cough. 118 mL 0   No facility-administered medications prior to visit.     Allergies  Allergen Reactions  .  Cefdinir     HIVES  . Celebrex [Celecoxib]     ABDOMINAL PAIN   . Ciprofloxacin     ABDOMINAL PAIN   . Codeine     NAUSEA VOMITING - can take with zofran  . Pseudoephedrine Hcl Er     TACHYCARDIA     Review of Systems  Constitutional: Negative for chills, fever and malaise/fatigue.  HENT: Negative for congestion and hearing loss.   Eyes: Negative for discharge.  Respiratory: Negative for cough, sputum production and shortness of breath.   Cardiovascular: Negative for chest pain, palpitations and leg swelling.  Gastrointestinal: Negative for abdominal pain, blood in stool, constipation, diarrhea, heartburn, nausea and vomiting.  Genitourinary: Negative for dysuria, frequency, hematuria and urgency.  Musculoskeletal: Negative for back pain, falls and myalgias.  Skin: Negative for rash.  Neurological: Negative for dizziness, sensory change, loss of consciousness, weakness and headaches.  Endo/Heme/Allergies: Negative for environmental allergies. Does not bruise/bleed easily.  Psychiatric/Behavioral: Negative for depression and suicidal ideas. The patient is not nervous/anxious and does not have insomnia.        Objective:    Physical Exam  Constitutional: She is oriented to person, place, and time. She appears well-developed and well-nourished. No distress.  HENT:  Head: Normocephalic and atraumatic.  Eyes: Conjunctivae are normal.  Neck: Neck supple. No thyromegaly present.  Cardiovascular: Normal rate, regular rhythm and normal heart sounds.   No murmur heard. Pulmonary/Chest: Effort normal and breath sounds normal. No respiratory distress.  Abdominal: Soft. Bowel sounds are normal. She exhibits no distension and no mass. There is no  tenderness.  Musculoskeletal: She exhibits no edema.  Lymphadenopathy:    She has no cervical adenopathy.  Neurological: She is alert and oriented to person, place, and time.  Skin: Skin is warm and dry.  Psychiatric: She has a normal mood and affect. Her behavior is normal.    BP 134/78   Pulse 80   Temp 97.9 F (36.6 C) (Oral)   Ht 5\' 1"  (1.549 m)   Wt 163 lb 12.8 oz (74.3 kg)   SpO2 99%   BMI 30.95 kg/m  Wt Readings from Last 3 Encounters:  11/23/16 163 lb 12.8 oz (74.3 kg)  06/05/16 165 lb 4 oz (75 kg)  05/31/16 165 lb 8 oz (75.1 kg)     Lab Results  Component Value Date   WBC 4.9 11/23/2016   HGB 11.1 (L) 11/23/2016   HCT 34.3 (L) 11/23/2016   PLT 256.0 11/23/2016   GLUCOSE 89 11/23/2016   CHOL 193 11/23/2016   TRIG 113.0 11/23/2016   HDL 61.60 11/23/2016   LDLCALC 109 (H) 11/23/2016   ALT 13 11/23/2016   AST 14 11/23/2016   NA 137 11/23/2016   K 3.9 11/23/2016   CL 103 11/23/2016   CREATININE 0.73 11/23/2016   BUN 12 11/23/2016   CO2 27 11/23/2016   TSH 0.74 11/23/2016   INR 1.19 02/07/2010    Lab Results  Component Value Date   TSH 0.74 11/23/2016   Lab Results  Component Value Date   WBC 4.9 11/23/2016   HGB 11.1 (L) 11/23/2016   HCT 34.3 (L) 11/23/2016   MCV 80.4 11/23/2016   PLT 256.0 11/23/2016   Lab Results  Component Value Date   NA 137 11/23/2016   K 3.9 11/23/2016   CO2 27 11/23/2016   GLUCOSE 89 11/23/2016   BUN 12 11/23/2016   CREATININE 0.73 11/23/2016   BILITOT 0.4 11/23/2016   ALKPHOS 52 11/23/2016  AST 14 11/23/2016   ALT 13 11/23/2016   PROT 7.0 11/23/2016   ALBUMIN 4.0 11/23/2016   CALCIUM 9.3 11/23/2016   ANIONGAP 8 05/31/2016   GFR 84.73 11/23/2016   Lab Results  Component Value Date   CHOL 193 11/23/2016   Lab Results  Component Value Date   HDL 61.60 11/23/2016   Lab Results  Component Value Date   LDLCALC 109 (H) 11/23/2016   Lab Results  Component Value Date   TRIG 113.0 11/23/2016   Lab  Results  Component Value Date   CHOLHDL 3 11/23/2016   No results found for: HGBA1C     Assessment & Plan:   Problem List Items Addressed This Visit    HTN (hypertension) (Chronic)    Well controlled, no changes to meds. Encouraged heart healthy diet such as the DASH diet and exercise as tolerated.       Relevant Orders   CBC with Differential/Platelet (Completed)   Comprehensive metabolic panel (Completed)   TSH (Completed)   Allergic rhinitis    nasonex refill is given today, doing well on current meds no changes      Gastroesophageal reflux disease with hiatal hernia    Avoid offending foods, start probiotics. Do not eat large meals in late evening and consider raising head of bed. Nexium is q am is taking Ranitidine 150 mg qid encouraged to drop to bid with 2 tums qhs and see how she does      History of chicken pox    Agrees to new shingles shot. She will call monthly to see if we have it yet.       Anemia    Check CBC       Relevant Orders   Fe+TIBC+Fer (Completed)   Preventative health care    Patient encouraged to maintain heart healthy diet, regular exercise, adequate sleep. Consider daily probiotics. Take medications as prescribed.       Vitamin D deficiency    Check level today, takes Vit D 2000 IU daily      Hyponatremia    Check cmp today      Vertigo    Maintain adequate hydration. Consider vestibular rehab if worsens      Hyperlipidemia    Encouraged heart healthy diet, increase exercise, avoid trans fats, consider a krill oil cap daily      Relevant Orders   Lipid panel (Completed)    Other Visit Diagnoses    Breast cancer screening    -  Primary   Relevant Orders   MM DIAG BREAST TOMO BILATERAL      I have discontinued Ms. Bloodsaw's nystatin ointment, dicyclomine, benzonatate, promethazine-dextromethorphan, and azithromycin. I am also having her maintain her fexofenadine, acetaminophen, benzocaine, Lidocaine-Hydrocortisone Ace, SIMPLY  SALINE NA, ondansetron, ranitidine, esomeprazole, meclizine, cholecalciferol, metoprolol succinate, and mometasone.  Meds ordered this encounter  Medications  . mometasone (NASONEX) 50 MCG/ACT nasal spray    Sig: Place 1-2 sprays into the nose daily as needed. Reported on 07/22/2015    Dispense:  17 g    Refill:  5     Penni Homans, MD

## 2016-11-23 NOTE — Assessment & Plan Note (Addendum)
Avoid offending foods, start probiotics. Do not eat large meals in late evening and consider raising head of bed. Nexium is q am is taking Ranitidine 150 mg qid encouraged to drop to bid with 2 tums qhs and see how she does

## 2016-11-23 NOTE — Assessment & Plan Note (Signed)
nasonex refill is given today, doing well on current meds no changes

## 2016-11-23 NOTE — Assessment & Plan Note (Signed)
Agrees to new shingles shot. She will call monthly to see if we have it yet.

## 2016-11-23 NOTE — Assessment & Plan Note (Signed)
Check cmp today 

## 2016-11-23 NOTE — Assessment & Plan Note (Signed)
Encouraged heart healthy diet, increase exercise, avoid trans fats, consider a krill oil cap daily 

## 2016-11-23 NOTE — Assessment & Plan Note (Signed)
Check CBC 

## 2016-11-23 NOTE — Assessment & Plan Note (Signed)
Encouraged to get adequate exercise, calcium and vitamin d intake 

## 2016-11-23 NOTE — Assessment & Plan Note (Addendum)
Check level today, takes Vit D 2000 IU daily

## 2016-11-23 NOTE — Patient Instructions (Signed)
Bone density shows osteopenia, which is thinner than normal but not as bad as osteoporosis. Recommend calcium intake of 1200 to 1500 mg daily, divided into roughly 3 doses. Best source is the diet and a single dairy serving is about 500 mg, a supplement of calcium citrate once or twice daily to balance diet is fine if not getting enough in diet. Also need Vitamin D 2000 IU caps, 1 cap daily if not already taking vitamin D. Also recommend weight baring exercise on hips and upper body to keep bones strong Preventive Care 65 Years and Older, Female Preventive care refers to lifestyle choices and visits with your health care provider that can promote health and wellness. What does preventive care include?  A yearly physical exam. This is also called an annual well check.  Dental exams once or twice a year.  Routine eye exams. Ask your health care provider how often you should have your eyes checked.  Personal lifestyle choices, including: ? Daily care of your teeth and gums. ? Regular physical activity. ? Eating a healthy diet. ? Avoiding tobacco and drug use. ? Limiting alcohol use. ? Practicing safe sex. ? Taking low-dose aspirin every day. ? Taking vitamin and mineral supplements as recommended by your health care provider. What happens during an annual well check? The services and screenings done by your health care provider during your annual well check will depend on your age, overall health, lifestyle risk factors, and family history of disease. Counseling Your health care provider may ask you questions about your:  Alcohol use.  Tobacco use.  Drug use.  Emotional well-being.  Home and relationship well-being.  Sexual activity.  Eating habits.  History of falls.  Memory and ability to understand (cognition).  Work and work Statistician.  Reproductive health.  Screening You may have the following tests or measurements:  Height, weight, and BMI.  Blood  pressure.  Lipid and cholesterol levels. These may be checked every 5 years, or more frequently if you are over 45 years old.  Skin check.  Lung cancer screening. You may have this screening every year starting at age 48 if you have a 30-pack-year history of smoking and currently smoke or have quit within the past 15 years.  Fecal occult blood test (FOBT) of the stool. You may have this test every year starting at age 31.  Flexible sigmoidoscopy or colonoscopy. You may have a sigmoidoscopy every 5 years or a colonoscopy every 10 years starting at age 58.  Hepatitis C blood test.  Hepatitis B blood test.  Sexually transmitted disease (STD) testing.  Diabetes screening. This is done by checking your blood sugar (glucose) after you have not eaten for a while (fasting). You may have this done every 1-3 years.  Bone density scan. This is done to screen for osteoporosis. You may have this done starting at age 26.  Mammogram. This may be done every 1-2 years. Talk to your health care provider about how often you should have regular mammograms.  Talk with your health care provider about your test results, treatment options, and if necessary, the need for more tests. Vaccines Your health care provider may recommend certain vaccines, such as:  Influenza vaccine. This is recommended every year.  Tetanus, diphtheria, and acellular pertussis (Tdap, Td) vaccine. You may need a Td booster every 10 years.  Varicella vaccine. You may need this if you have not been vaccinated.  Zoster vaccine. You may need this after age 75.  Measles, mumps,  Measles, mumps, and rubella (MMR) vaccine. You may need at least one dose of MMR if you were born in 1957 or later. You may also need a second dose.  Pneumococcal 13-valent conjugate (PCV13) vaccine. One dose is recommended after age 65.  Pneumococcal polysaccharide (PPSV23) vaccine. One dose is recommended after age 65.  Meningococcal vaccine. You may need this if you  have certain conditions.  Hepatitis A vaccine. You may need this if you have certain conditions or if you travel or work in places where you may be exposed to hepatitis A.  Hepatitis B vaccine. You may need this if you have certain conditions or if you travel or work in places where you may be exposed to hepatitis B.  Haemophilus influenzae type b (Hib) vaccine. You may need this if you have certain conditions. Talk to your health care provider about which screenings and vaccines you need and how often you need them. This information is not intended to replace advice given to you by your health care provider. Make sure you discuss any questions you have with your health care provider. Document Released: 04/08/2015 Document Revised: 11/30/2015 Document Reviewed: 01/11/2015 Elsevier Interactive Patient Education  2017 [employer]  

## 2016-11-24 LAB — IRON,TIBC AND FERRITIN PANEL
%SAT: 11 % (ref 11–50)
FERRITIN: 5 ng/mL — AB (ref 20–288)
IRON: 48 ug/dL (ref 45–160)
TIBC: 436 ug/dL (ref 250–450)

## 2016-11-26 NOTE — Assessment & Plan Note (Signed)
Patient encouraged to maintain heart healthy diet, regular exercise, adequate sleep. Consider daily probiotics. Take medications as prescribed 

## 2016-11-27 ENCOUNTER — Other Ambulatory Visit: Payer: Self-pay

## 2016-11-27 ENCOUNTER — Telehealth: Payer: Self-pay | Admitting: Family Medicine

## 2016-11-27 MED ORDER — FERROUS FUMARATE 325 (106 FE) MG PO TABS: 1.0000 | ORAL_TABLET | Freq: Every day | ORAL | 3 refills | Status: DC

## 2016-11-27 MED FILL — raNITIdine HCL 150 MG TABS: 150 | 90 days supply | Qty: 360 | Fill #1

## 2016-11-27 MED FILL — FERROCITE TABLET: 324 | 30 days supply | Qty: 30 | Fill #0

## 2016-11-27 NOTE — Telephone Encounter (Signed)
Error/gd °

## 2016-12-04 MED FILL — ESOMEPRAZOLE MAGNESIUM 40 M: 40 | 30 days supply | Qty: 30 | Fill #2

## 2017-01-02 ENCOUNTER — Other Ambulatory Visit: Payer: Self-pay | Admitting: Family Medicine

## 2017-01-02 DIAGNOSIS — Z1231 Encounter for screening mammogram for malignant neoplasm of breast: Secondary | ICD-10-CM

## 2017-01-10 ENCOUNTER — Telehealth: Payer: Self-pay | Admitting: Family Medicine

## 2017-01-10 MED FILL — ESOMEPRAZOLE MAG DR 40 MG C: 40 | 30 days supply | Qty: 30 | Fill #3

## 2017-01-10 NOTE — Telephone Encounter (Signed)
Pt's spouse dropped off document to have on pt chart (Copy of flu shot - the high dose done- 2 pages) Document put at front office tray.

## 2017-01-18 ENCOUNTER — Ambulatory Visit
Admission: RE | Admit: 2017-01-18 | Discharge: 2017-01-18 | Disposition: A | Discharge status: Home / Self Care | Payer: 59 | Source: Ambulatory Visit | Attending: Family Medicine | Admitting: Family Medicine

## 2017-01-18 DIAGNOSIS — Z1231 Encounter for screening mammogram for malignant neoplasm of breast: Secondary | ICD-10-CM

## 2017-01-29 MED FILL — LIDOCAINE-HC 3-0.5% CREAM: 3-0.5 | 30 days supply | Qty: 98 | Fill #1

## 2017-02-11 MED FILL — METOPROLOL SUCC ER 50 MG TA: 50 | 90 days supply | Qty: 90 | Fill #1

## 2017-02-27 MED FILL — MOMETASONE FUROATE 50 MCG S: 50 | 30 days supply | Qty: 17 | Fill #0

## 2017-02-27 MED FILL — ESOMEPRAZOLE MAG DR 40 MG C: 40 | 30 days supply | Qty: 30 | Fill #4

## 2017-02-27 MED FILL — raNITIdine HCL 150 MG TABS: 150 | 90 days supply | Qty: 360 | Fill #2

## 2017-03-01 ENCOUNTER — Ambulatory Visit (INDEPENDENT_AMBULATORY_CARE_PROVIDER_SITE_OTHER): Payer: 59

## 2017-03-01 DIAGNOSIS — Z23 Encounter for immunization: Secondary | ICD-10-CM

## 2017-03-08 DIAGNOSIS — K219 Gastro-esophageal reflux disease without esophagitis: Secondary | ICD-10-CM | POA: Diagnosis not present

## 2017-03-08 DIAGNOSIS — Z86010 Personal history of colonic polyps: Secondary | ICD-10-CM | POA: Diagnosis not present

## 2017-04-29 MED FILL — MOMETASONE FUROATE 50 MCG S: 50 | 30 days supply | Qty: 17 | Fill #1

## 2017-05-08 MED FILL — METOPROLOL SUCC ER 50 MG TA: 50 | 90 days supply | Qty: 90 | Fill #2

## 2017-05-17 ENCOUNTER — Encounter: Payer: Self-pay | Admitting: Family Medicine

## 2017-05-24 ENCOUNTER — Encounter: Payer: Self-pay | Admitting: Family Medicine

## 2017-05-24 ENCOUNTER — Ambulatory Visit: Payer: 59 | Admitting: Family Medicine

## 2017-05-24 VITALS — BP 144/84 | HR 76 | Temp 97.9°F | Resp 16 | Ht 61.02 in | Wt 170.6 lb

## 2017-05-24 DIAGNOSIS — E559 Vitamin D deficiency, unspecified: Secondary | ICD-10-CM | POA: Diagnosis not present

## 2017-05-24 DIAGNOSIS — E871 Hypo-osmolality and hyponatremia: Secondary | ICD-10-CM

## 2017-05-24 DIAGNOSIS — R0989 Other specified symptoms and signs involving the circulatory and respiratory systems: Secondary | ICD-10-CM | POA: Diagnosis not present

## 2017-05-24 DIAGNOSIS — J309 Allergic rhinitis, unspecified: Secondary | ICD-10-CM | POA: Diagnosis not present

## 2017-05-24 DIAGNOSIS — H8110 Benign paroxysmal vertigo, unspecified ear: Secondary | ICD-10-CM

## 2017-05-24 DIAGNOSIS — I10 Essential (primary) hypertension: Secondary | ICD-10-CM | POA: Diagnosis not present

## 2017-05-24 DIAGNOSIS — E785 Hyperlipidemia, unspecified: Secondary | ICD-10-CM

## 2017-05-24 LAB — TSH: TSH: 0.75 u[IU]/mL (ref 0.35–4.50)

## 2017-05-24 LAB — COMPREHENSIVE METABOLIC PANEL
ALBUMIN: 3.8 g/dL (ref 3.5–5.2)
ALK PHOS: 51 U/L (ref 39–117)
ALT: 13 U/L (ref 0–35)
AST: 15 U/L (ref 0–37)
BUN: 16 mg/dL (ref 6–23)
CHLORIDE: 105 meq/L (ref 96–112)
CO2: 28 mEq/L (ref 19–32)
Calcium: 9.6 mg/dL (ref 8.4–10.5)
Creatinine, Ser: 0.71 mg/dL (ref 0.40–1.20)
GFR: 87.36 mL/min (ref >60.00)
Glucose, Bld: 101 mg/dL — ABNORMAL HIGH (ref 70–99)
POTASSIUM: 3.9 meq/L (ref 3.5–5.1)
SODIUM: 140 meq/L (ref 135–145)
TOTAL PROTEIN: 7.5 g/dL (ref 6.0–8.3)
Total Bilirubin: 0.3 mg/dL (ref 0.2–1.2)

## 2017-05-24 LAB — CBC WITH DIFFERENTIAL/PLATELET
BASOS PCT: 0.9 % (ref 0.0–3.0)
Basophils Absolute: 0 10*3/uL (ref 0.0–0.1)
EOS PCT: 1.2 % (ref 0.0–5.0)
Eosinophils Absolute: 0.1 10*3/uL (ref 0.0–0.7)
HCT: 35.7 % — ABNORMAL LOW (ref 36.0–46.0)
HEMOGLOBIN: 11.7 g/dL — AB (ref 12.0–15.0)
Lymphocytes Relative: 29.9 % (ref 12.0–46.0)
Lymphs Abs: 1.7 10*3/uL (ref 0.7–4.0)
MCHC: 32.8 g/dL (ref 30.0–36.0)
MCV: 81.2 fl (ref 78.0–100.0)
MONO ABS: 0.7 10*3/uL (ref 0.1–1.0)
MONOS PCT: 11.8 % (ref 3.0–12.0)
Neutro Abs: 3.2 10*3/uL (ref 1.4–7.7)
Neutrophils Relative %: 56.2 % (ref 43.0–77.0)
Platelets: 265 10*3/uL (ref 150.0–400.0)
RBC: 4.4 Mil/uL (ref 3.87–5.11)
RDW: 15.4 % (ref 11.5–15.5)
WBC: 5.7 10*3/uL (ref 4.0–10.5)

## 2017-05-24 LAB — LIPID PANEL
CHOLESTEROL: 184 mg/dL (ref 0–200)
HDL: 58.7 mg/dL (ref >39.00)
LDL CALC: 91 mg/dL (ref 0–99)
NonHDL: 125.53
Total CHOL/HDL Ratio: 3
Triglycerides: 173 mg/dL — ABNORMAL HIGH (ref 0.0–149.0)
VLDL: 34.6 mg/dL (ref 0.0–40.0)

## 2017-05-24 LAB — SEDIMENTATION RATE: Sed Rate: 6 mm/hr (ref 0–30)

## 2017-05-24 LAB — VITAMIN D 25 HYDROXY (VIT D DEFICIENCY, FRACTURES): VITD: 36.11 ng/mL (ref 30.00–100.00)

## 2017-05-24 MED ORDER — AZELASTINE HCL 0.1 % NA SOLN: 2.0000 | Freq: Two times a day (BID) | NASAL | 12 refills | Status: DC

## 2017-05-24 MED ORDER — ONDANSETRON HCL 8 MG PO TABS: 8.0000 mg | ORAL_TABLET | Freq: Three times a day (TID) | ORAL | 1 refills | Status: DC | PRN

## 2017-05-24 MED ORDER — MECLIZINE HCL 25 MG PO TABS: 25.0000 mg | ORAL_TABLET | Freq: Three times a day (TID) | ORAL | 2 refills | Status: DC | PRN

## 2017-05-24 MED ORDER — MOMETASONE FUROATE 50 MCG/ACT NA SUSP: 1.0000 | Freq: Every day | NASAL | 5 refills | Status: DC | PRN

## 2017-05-24 MED FILL — AZELASTINE HCL 137 MCG/SPRA: 137 | 25 days supply | Qty: 30 | Fill #0

## 2017-05-24 MED FILL — ONDANSETRON HCL 8 MG TABLET: 8 | 30 days supply | Qty: 90 | Fill #0

## 2017-05-24 MED FILL — MECLIZINE 25 MG TABLET: 25 | 10 days supply | Qty: 30 | Fill #0

## 2017-05-24 NOTE — Assessment & Plan Note (Signed)
Encouraged heart healthy diet, increase exercise, avoid trans fats, consider a krill oil cap daily 

## 2017-05-24 NOTE — Assessment & Plan Note (Signed)
Had a URI 3-4 weeks ago and worsening disequilibrium since then

## 2017-05-24 NOTE — Assessment & Plan Note (Signed)
Well controlled, no changes to meds. Encouraged heart healthy diet such as the DASH diet and exercise as tolerated.  °

## 2017-05-24 NOTE — Assessment & Plan Note (Signed)
Check cmp 

## 2017-05-24 NOTE — Patient Instructions (Signed)

## 2017-05-24 NOTE — Assessment & Plan Note (Signed)
Check level today 

## 2017-05-24 NOTE — Progress Notes (Signed)
Subjective:  I acted as a Education administrator for [employer]. Yancey Flemings, [place]   Patient ID: Carrie Elliott, female    DOB: 1950-05-20, 67 y.o.   MRN: [id number]  Chief Complaint  Patient presents with  . Follow-up    HPI  Patient is in today for follow up visit and she notes a 3 week history of flare in vertiginous symptoms. She describes feeling woozey and off balance without a true spinning sensation. She notes it is worse when she hyperextends her neck. No other associated symptoms no recent febrile illness or falls. She has noted some increase in congestion and a sense of fullnes and popping inher ears. Has been taking antihistamines with some questionable relief. Denies CP/palp/SOB/HA/congestion/fevers/GI or GU c/o. Taking meds as prescribed  Patient Care Team: Mosie Lukes, MD as PCP - General (Family Medicine)   Past Medical History:  Diagnosis Date  . Allergic rhinitis   . Allergy   . Anemia 08/24/2014  . Arthritis    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, shoulders, knees  . Benign paroxysmal positional vertigo 07/12/2013  . Chicken pox   . Cystocele    grade 2  . Endometriosis    with Menometrorhaghia   . Gastric polyps 09/30/2015  . Gastroesophageal reflux disease with hiatal hernia    Cough Sees Dr Laretta Bolster   . GERD (gastroesophageal reflux disease)   . Hemorrhoids   . History of colonic polyps   . History of colonic polyps 09/30/2015   adenoma  . HTN (hypertension) 04/23/2013  . Hypocalcemia 09/30/2015  . Hyponatremia 11/23/2016  . IBS (irritable bowel syndrome)   . Measles   . Mumps   . Muscle spasm 09/30/2015  . Osteopenia 10/09/2015  . Rectocele    grade 2  . Reflux    cough  . Rosacea   . Sternal fracture    1986  . Sun-damaged skin 09/05/2014  . Vertigo 11/23/2016  . Vitamin D deficiency 04/06/2016  . 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 APPENDECTOMY  1989  . atypical nevus excision    . BREAST BIOPSY    . CHOLECYSTECTOMY  1989  . COLONOSCOPY   2011  . COLONOSCOPY WITH PROPOFOL N/A 05/27/2015   Procedure: COLONOSCOPY WITH PROPOFOL;  Surgeon: Clarene Essex, MD;  Location: [facility];  Service: Endoscopy;  Laterality: N/A;  . CYSTOSCOPY W/ URETERAL STENT REMOVAL  1999  . ESOPHAGOGASTRODUODENOSCOPY  2011   with esophageal stent, perforation   . ESOPHAGOGASTRODUODENOSCOPY (EGD) WITH PROPOFOL N/A 05/27/2015   Procedure: ESOPHAGOGASTRODUODENOSCOPY (EGD) WITH PROPOFOL;  Surgeon: Clarene Essex, MD;  Location: [facility];  Service: Endoscopy;  Laterality: N/A;  . HEMORRHOID SURGERY  2005  . LEFT OOPHORECTOMY  5462   complicated by ureteral injuery requiring a ureteral stent   . MMK / ANTERIOR VESICOURETHROPEXY / URETHROPEXY  2000  . TONSILLECTOMY AND ADENOIDECTOMY    . TOTAL ABDOMINAL HYSTERECTOMY  2000  . TUBAL LIGATION  1981  . wisdom teeth removal  1964    Family History  Problem Relation Age of Onset  . Colon polyps Father   . Hypertension Father   . Heart failure Father   . Melanoma Father        multiple  . Heart attack Father        x2  . Heart disease Father        Bundle Block  . Meniere's disease Father   . Cancer Father  melanoma  . Osteoporosis Mother   . Transient ischemic attack Mother   . Cancer Mother        colon  . Heart disease Mother        afib  . Hyperlipidemia Mother   . Diabetes Paternal Grandmother   . Stroke Maternal Grandmother   . Cancer Maternal Grandmother        skin, melanoma, sarcoma  . Colon polyps Maternal Grandmother   . Diverticulitis Maternal Grandmother   . Stroke Maternal Grandfather   . Heart disease Maternal Grandfather   . Obesity Brother     Social History   Socioeconomic History  . Marital status: Married    Spouse name: Not on file  . Number of children: Not on file  . Years of education: Not on file  . Highest education level: Not on file  Social Needs  . Financial resource strain: Not on file  . Food insecurity - worry: Not on file  . Food insecurity -  inability: Not on file  . Transportation needs - medical: Not on file  . Transportation needs - non-medical: Not on file  Occupational History  . Not on file  Tobacco Use  . Smoking status: Never Smoker  . Smokeless tobacco: Never Used  Substance and Sexual Activity  . Alcohol use: No  . Drug use: No  . 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 Not on file  Social History Narrative  . Not on file    Outpatient Medications Prior to Visit  Medication Sig Dispense Refill  . acetaminophen (TYLENOL) 500 MG tablet Take 1,000 mg by mouth every 6 (six) hours as needed for headache (pain).    . benzocaine (ORAJEL) 10 % mucosal gel Use as directed 1 application in the mouth or throat as needed for mouth pain. Reported on 07/22/2015    . cholecalciferol (VITAMIN D) 1000 units tablet Take 1,000 Units by mouth daily.    Marland Kitchen esomeprazole (NEXIUM) 40 MG capsule Take 1 capsule (40 mg total) by mouth 2 (two) times daily before a meal. 180 capsule 3  . fexofenadine (ALLEGRA) 180 MG tablet Take 180 mg by mouth every morning.     . Lidocaine-Hydrocortisone Ace 3-0.5 % CREA Apply 1 application topically daily as needed (hemmoriods.).     Marland Kitchen metoprolol succinate (TOPROL-XL) 50 MG 24 hr tablet TAKE 1 TABLET BY MOUTH DAILY 90 tablet 3  . ranitidine (ZANTAC) 150 MG tablet Take 150 mg by mouth 4 (four) times daily.    Marland Kitchen SIMPLY SALINE NA Place 1 each into the nose daily.     . meclizine (ANTIVERT) 25 MG tablet Take 1 tablet (25 mg total) by mouth 3 (three) times daily as needed for dizziness. 30 tablet 2  . mometasone (NASONEX) 50 MCG/ACT nasal spray Place 1-2 sprays into the nose daily as needed. Reported on 07/22/2015 17 g 5  . ondansetron (ZOFRAN) 8 MG tablet Take 1 tablet (8 mg total) by mouth every 8 (eight) hours as needed for nausea or vomiting. 90 tablet 1  . ferrous fumarate (HEMOCYTE - 106 MG FE) 325 (106 Fe) MG TABS tablet  Take 1 tablet (106 mg of iron total) by mouth daily. (Patient not taking: Reported on 05/24/2017) 30 each 3   No facility-administered medications prior to visit.     Allergies  Allergen Reactions  . Cefdinir     HIVES  .  Celebrex [Celecoxib]     ABDOMINAL PAIN   . Ciprofloxacin     ABDOMINAL PAIN   . Codeine     NAUSEA VOMITING - can take with zofran  . Pseudoephedrine Hcl Er     TACHYCARDIA   . Singulair [Montelukast Sodium] Other (See Comments)    Mental status changes, irritable    ROS     Objective:    Physical Exam  Constitutional: She is oriented to person, place, and time. She appears well-developed and well-nourished. No distress.  HENT:  Head: Normocephalic and atraumatic.  Nose: Nose normal.  Eyes: Right eye exhibits no discharge. Left eye exhibits no discharge.  Neck: Normal range of motion. Neck supple.  Cardiovascular: Normal rate and regular rhythm.  No murmur heard. Pulmonary/Chest: Effort normal and breath sounds normal.  Abdominal: Soft. Bowel sounds are normal. There is no tenderness.  Musculoskeletal: She exhibits no edema.  Neurological: She is alert and oriented to person, place, and time. She has normal reflexes. She displays normal reflexes. Coordination normal.  Slight nystagmus with right ward gaze, 1 beat  Skin: Skin is warm and dry.  Psychiatric: She has a normal mood and affect.  Nursing note and vitals reviewed.   BP (!) 144/84 (BP Location: Left Arm, Patient Position: Sitting, Cuff Size: Large)   Pulse 76   Temp 97.9 F (36.6 C) (Oral)   Resp 16   Ht 5' 1.02" (1.55 m)   Wt 170 lb 9.6 oz (77.4 kg)   SpO2 98%   BMI 32.21 kg/m  Wt Readings from Last 3 Encounters:  05/24/17 170 lb 9.6 oz (77.4 kg)  11/23/16 163 lb 12.8 oz (74.3 kg)  06/05/16 165 lb 4 oz (75 kg)   BP Readings from Last 3 Encounters:  05/24/17 (!) 144/84  11/23/16 134/78  06/05/16 (!) 146/85     Immunization History  Administered Date(s) Administered  .  Hepatitis A 01/21/2004, 10/12/2004  . Hepatitis B 01/14/2004, 03/01/2004, 10/12/2004  . Influenza Split 12/28/2014  . Influenza Whole 12/27/2010  . Influenza, High Dose Seasonal PF 12/19/2015, 01/07/2017  . Influenza-Unspecified 12/25/2011, 12/09/2012, 01/11/2014  . Pneumococcal Conjugate-13 09/30/2015  . Pneumococcal Polysaccharide-23 11/23/2016  . Td 05/24/1997, 07/11/2007  . Zoster 12/26/2012  . Zoster Recombinat (Shingrix) 03/01/2017    Health Maintenance  Topic Date Due  . Samul Dada  07/10/2017  . MAMMOGRAM  01/19/2019  . COLONOSCOPY  05/26/2025  . INFLUENZA VACCINE  Completed  . DEXA SCAN  Completed  . Hepatitis C Screening  Completed  . PNA vac Low Risk Adult  Completed    Lab Results  Component Value Date   WBC 5.7 05/24/2017   HGB 11.7 (L) 05/24/2017   HCT 35.7 (L) 05/24/2017   PLT 265.0 05/24/2017   GLUCOSE 101 (H) 05/24/2017   CHOL 184 05/24/2017   TRIG 173.0 (H) 05/24/2017   HDL 58.70 05/24/2017   LDLCALC 91 05/24/2017   ALT 13 05/24/2017   AST 15 05/24/2017   NA 140 05/24/2017   K 3.9 05/24/2017   CL 105 05/24/2017   CREATININE 0.71 05/24/2017   BUN 16 05/24/2017   CO2 28 05/24/2017   TSH 0.75 05/24/2017   INR 1.19 02/07/2010    Lab Results  Component Value Date   TSH 0.75 05/24/2017   Lab Results  Component Value Date   WBC 5.7 05/24/2017   HGB 11.7 (L) 05/24/2017   HCT 35.7 (L) 05/24/2017   MCV 81.2 05/24/2017   PLT 265.0 05/24/2017  Lab Results  Component Value Date   NA 140 05/24/2017   K 3.9 05/24/2017   CO2 28 05/24/2017   GLUCOSE 101 (H) 05/24/2017   BUN 16 05/24/2017   CREATININE 0.71 05/24/2017   BILITOT 0.3 05/24/2017   ALKPHOS 51 05/24/2017   AST 15 05/24/2017   ALT 13 05/24/2017   PROT 7.5 05/24/2017   ALBUMIN 3.8 05/24/2017   CALCIUM 9.6 05/24/2017   ANIONGAP 8 05/31/2016   GFR 87.36 05/24/2017   Lab Results  Component Value Date   CHOL 184 05/24/2017   Lab Results  Component Value Date   HDL 58.70  05/24/2017   Lab Results  Component Value Date   LDLCALC 91 05/24/2017   Lab Results  Component Value Date   TRIG 173.0 (H) 05/24/2017   Lab Results  Component Value Date   CHOLHDL 3 05/24/2017   No results found for: HGBA1C       Assessment & Plan:   Problem List Items Addressed This Visit    HTN (hypertension) (Chronic)    Well controlled, no changes to meds. Encouraged heart healthy diet such as the DASH diet and exercise as tolerated.       Relevant Orders   CBC with Differential/Platelet (Completed)   Comprehensive metabolic panel (Completed)   TSH (Completed)   Allergic rhinitis    Continue current meds and add Astelin, may consider Singulair       Benign paroxysmal positional vertigo    Had a URI 3-4 weeks ago and worsening disequilibrium since then      Relevant Orders   CBC with Differential/Platelet (Completed)   Sedimentation rate (Completed)   US Carotid Bilateral   Vitamin D deficiency    Check level today      Relevant Orders   VITAMIN D 25 Hydroxy (Vit-D Deficiency, Fractures) (Completed)   Hyponatremia    Check cmp      Relevant Orders   Sedimentation rate (Completed)   Hyperlipidemia    Encouraged heart healthy diet, increase exercise, avoid trans fats, consider a krill oil cap daily      Relevant Orders   Lipid panel (Completed)   Bruit of right carotid artery - Primary    Mild but with recent symptoms will proceed with carotid ultrasound.      Relevant Orders   US Carotid Bilateral      I am having Christean Grief. Groman start on azelastine. I am also having her maintain her fexofenadine, acetaminophen, benzocaine, Lidocaine-Hydrocortisone Ace, SIMPLY SALINE NA, ranitidine, esomeprazole, cholecalciferol, metoprolol succinate, ferrous fumarate, meclizine, mometasone, and ondansetron.  Meds ordered this encounter  Medications  . meclizine (ANTIVERT) 25 MG tablet    Sig: Take 1 tablet (25 mg total) by mouth 3 (three) times daily as  needed for dizziness.    Dispense:  30 tablet    Refill:  2  . mometasone (NASONEX) 50 MCG/ACT nasal spray    Sig: Place 1-2 sprays into the nose daily as needed. Reported on 07/22/2015    Dispense:  17 g    Refill:  5  . ondansetron (ZOFRAN) 8 MG tablet    Sig: Take 1 tablet (8 mg total) by mouth every 8 (eight) hours as needed for nausea or vomiting.    Dispense:  90 tablet    Refill:  1  . azelastine (ASTELIN) 0.1 % nasal spray    Sig: Place 2 sprays into both nostrils 2 (two) times daily. Use in each nostril as directed  Dispense:  30 mL    Refill:  12    CMA served as scribe during this visit. History, Physical and Plan performed by medical provider. Documentation and orders reviewed and attested to.  Penni Homans, MD

## 2017-05-26 DIAGNOSIS — R0989 Other specified symptoms and signs involving the circulatory and respiratory systems: Secondary | ICD-10-CM | POA: Insufficient documentation

## 2017-05-26 NOTE — Assessment & Plan Note (Signed)
Mild but with recent symptoms will proceed with carotid ultrasound.

## 2017-05-26 NOTE — Assessment & Plan Note (Signed)
Continue current meds and add Astelin, may consider Singulair

## 2017-05-28 ENCOUNTER — Ambulatory Visit (HOSPITAL_BASED_OUTPATIENT_CLINIC_OR_DEPARTMENT_OTHER)
Admission: RE | Admit: 2017-05-28 | Discharge: 2017-05-28 | Disposition: A | Discharge status: Home / Self Care | Payer: 59 | Source: Ambulatory Visit | Attending: Family Medicine | Admitting: Family Medicine

## 2017-05-28 DIAGNOSIS — R0989 Other specified symptoms and signs involving the circulatory and respiratory systems: Secondary | ICD-10-CM | POA: Insufficient documentation

## 2017-05-28 DIAGNOSIS — H8110 Benign paroxysmal vertigo, unspecified ear: Secondary | ICD-10-CM | POA: Diagnosis not present

## 2017-05-28 DIAGNOSIS — I6522 Occlusion and stenosis of left carotid artery: Secondary | ICD-10-CM | POA: Diagnosis not present

## 2017-05-28 MED FILL — raNITIdine HCL 150 MG TABS: 150 | 90 days supply | Qty: 360 | Fill #3

## 2017-05-29 ENCOUNTER — Encounter: Payer: Self-pay | Admitting: Family Medicine

## 2017-06-03 ENCOUNTER — Encounter: Payer: Self-pay | Admitting: Family Medicine

## 2017-06-11 MED FILL — ESOMEPRAZOLE MAG DR 40 MG C: 40 | 30 days supply | Qty: 30 | Fill #5

## 2017-06-30 ENCOUNTER — Encounter: Payer: Self-pay | Admitting: Family Medicine

## 2017-07-08 MED FILL — MOMETASONE FUROATE 50 MCG S: 50 | 30 days supply | Qty: 17 | Fill #2

## 2017-07-18 ENCOUNTER — Encounter: Payer: Self-pay | Admitting: Family Medicine

## 2017-07-25 MED FILL — ESOMEPRAZOLE MAG DR 40 MG C: 40 | 30 days supply | Qty: 30 | Fill #6

## 2017-07-26 ENCOUNTER — Ambulatory Visit (INDEPENDENT_AMBULATORY_CARE_PROVIDER_SITE_OTHER): Payer: 59

## 2017-07-26 DIAGNOSIS — Z23 Encounter for immunization: Secondary | ICD-10-CM | POA: Diagnosis not present

## 2017-07-26 NOTE — Progress Notes (Signed)
Patient came in for his 2nd shingles shot. She tolerated 0.101mL in her left deltoid with no complications

## 2017-07-31 MED FILL — METOPROLOL SUCCINATE ER 50: 50 | 90 days supply | Qty: 90 | Fill #3

## 2017-08-27 MED FILL — ESOMEPRAZOLE MAG DR 40 MG C: 40 | 90 days supply | Qty: 90 | Fill #7

## 2017-09-13 ENCOUNTER — Ambulatory Visit: Payer: 59 | Admitting: Family Medicine

## 2017-09-13 ENCOUNTER — Encounter: Payer: Self-pay | Admitting: Family Medicine

## 2017-09-13 VITALS — BP 136/84 | HR 72 | Temp 98.1°F | Resp 18 | Wt 167.0 lb

## 2017-09-13 DIAGNOSIS — E785 Hyperlipidemia, unspecified: Secondary | ICD-10-CM | POA: Diagnosis not present

## 2017-09-13 DIAGNOSIS — I10 Essential (primary) hypertension: Secondary | ICD-10-CM | POA: Diagnosis not present

## 2017-09-13 DIAGNOSIS — R739 Hyperglycemia, unspecified: Secondary | ICD-10-CM

## 2017-09-13 DIAGNOSIS — D649 Anemia, unspecified: Secondary | ICD-10-CM | POA: Diagnosis not present

## 2017-09-13 DIAGNOSIS — E871 Hypo-osmolality and hyponatremia: Secondary | ICD-10-CM

## 2017-09-13 DIAGNOSIS — R42 Dizziness and giddiness: Secondary | ICD-10-CM | POA: Diagnosis not present

## 2017-09-13 DIAGNOSIS — E559 Vitamin D deficiency, unspecified: Secondary | ICD-10-CM

## 2017-09-13 DIAGNOSIS — R51 Headache: Secondary | ICD-10-CM | POA: Diagnosis not present

## 2017-09-13 DIAGNOSIS — R519 Headache, unspecified: Secondary | ICD-10-CM

## 2017-09-13 LAB — LIPID PANEL
CHOLESTEROL: 183 mg/dL (ref 0–200)
HDL: 63.1 mg/dL (ref >39.00)
LDL Cholesterol: 92 mg/dL (ref 0–99)
NONHDL: 120.21
Total CHOL/HDL Ratio: 3
Triglycerides: 139 mg/dL (ref 0.0–149.0)
VLDL: 27.8 mg/dL (ref 0.0–40.0)

## 2017-09-13 LAB — COMPREHENSIVE METABOLIC PANEL
ALBUMIN: 4.1 g/dL (ref 3.5–5.2)
ALK PHOS: 54 U/L (ref 39–117)
ALT: 16 U/L (ref 0–35)
AST: 18 U/L (ref 0–37)
BILIRUBIN TOTAL: 0.4 mg/dL (ref 0.2–1.2)
BUN: 12 mg/dL (ref 6–23)
CO2: 26 mEq/L (ref 19–32)
Calcium: 9.3 mg/dL (ref 8.4–10.5)
Chloride: 104 mEq/L (ref 96–112)
Creatinine, Ser: 0.7 mg/dL (ref 0.40–1.20)
GFR: 88.72 mL/min (ref >60.00)
Glucose, Bld: 79 mg/dL (ref 70–99)
POTASSIUM: 4 meq/L (ref 3.5–5.1)
Sodium: 139 mEq/L (ref 135–145)
TOTAL PROTEIN: 6.9 g/dL (ref 6.0–8.3)

## 2017-09-13 LAB — CBC
HEMATOCRIT: 35.9 % — AB (ref 36.0–46.0)
HEMOGLOBIN: 11.9 g/dL — AB (ref 12.0–15.0)
MCHC: 33.3 g/dL (ref 30.0–36.0)
MCV: 81.5 fl (ref 78.0–100.0)
Platelets: 257 10*3/uL (ref 150.0–400.0)
RBC: 4.41 Mil/uL (ref 3.87–5.11)
RDW: 16 % — ABNORMAL HIGH (ref 11.5–15.5)
WBC: 5.8 10*3/uL (ref 4.0–10.5)

## 2017-09-13 LAB — HEMOGLOBIN A1C: HEMOGLOBIN A1C: 6 % (ref 4.6–6.5)

## 2017-09-13 LAB — VITAMIN D 25 HYDROXY (VIT D DEFICIENCY, FRACTURES): VITD: 38.57 ng/mL (ref 30.00–100.00)

## 2017-09-13 LAB — TSH: TSH: 0.79 u[IU]/mL (ref 0.35–4.50)

## 2017-09-13 MED ORDER — MOMETASONE FUROATE 50 MCG/ACT NA SUSP: 1.0000 | Freq: Every day | NASAL | 5 refills | Status: DC | PRN

## 2017-09-13 MED ORDER — METOPROLOL SUCCINATE ER 50 MG PO TB24: 50.0000 mg | ORAL_TABLET | Freq: Every day | ORAL | 3 refills | Status: DC

## 2017-09-13 MED FILL — raNITIdine HCL 150 MG TABS: 150 | 90 days supply | Qty: 360 | Fill #0

## 2017-09-13 MED FILL — MOMETASONE FUROATE 50 MCG S: 50 | 60 days supply | Qty: 17 | Fill #0

## 2017-09-13 NOTE — Patient Instructions (Signed)
For osteopenia call insurance and have them tell us how much a Dexa scan will cost of pocket, after October 09 2017, can order any time. Vertigo Vertigo means that you feel like you are moving when you are not. Vertigo can also make you feel like things around you are moving when they are not. This feeling can come and go at any time. Vertigo often goes away on its own. Follow these instructions at home:  Avoid making fast movements.  Avoid driving.  Avoid using heavy machinery.  Avoid doing any task or activity that might cause danger to you or other people if you would have a vertigo attack while you are doing it.  Sit down right away if you feel dizzy or have trouble with your balance.  Take over-the-counter and prescription medicines only as told by your doctor.  Follow instructions from your doctor about which positions or movements you should avoid.  Drink enough fluid to keep your pee (urine) clear or pale yellow.  Keep all follow-up visits as told by your doctor. This is important. Contact a doctor if:  Medicine does not help your vertigo.  You have a fever.  Your problems get worse or you have new symptoms.  Your family or friends see changes in your behavior.  You feel sick to your stomach (nauseous) or you throw up (vomit).  You have a "pins and needles" feeling or you are numb in part of your body. Get help right away if:  You have trouble moving or talking.  You are always dizzy.  You pass out (faint).  You get very bad headaches.  You feel weak or have trouble using your hands, arms, or legs.  You have changes in your hearing.  You have changes in your seeing (vision).  You get a stiff neck.  Bright light starts to bother you. This information is not intended to replace advice given to you by your health care provider. Make sure you discuss any questions you have with your health care provider. Document Released: 12/20/2007 Document Revised:  08/18/2015 Document Reviewed: 07/05/2014 Elsevier Interactive Patient Education  [employer].

## 2017-09-13 NOTE — Progress Notes (Signed)
Subjective:  I acted as a Education administrator for Dr. Charlett Blake. [place], [place]  Patient ID: Carrie Elliott, female    DOB: 05-27-1950, 67 y.o.   MRN: [id number]  No chief complaint on file.   HPI  Patient is in today for a 3 month follow up and is accompanied by her husband. Persistent and bad enough to cause her to stumble and nearly fall on a frequent basis. She has been noting it is worse with quick movements and turning to the left. Has some intermittent headache as well. Denies any recent febrile illness or hospitalizations. Does acknowledge some persistent congestoin. Denies CP/palp/SOB/GI or GU c/o. Taking meds as prescribed  Patient Care Team: Mosie Lukes, MD as PCP - General (Family Medicine)   Past Medical History:  Diagnosis Date  . Allergic rhinitis   . Allergy   . Anemia 08/24/2014  . Arthritis    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, shoulders, knees  . Benign paroxysmal positional vertigo 07/12/2013  . Chicken pox   . Cystocele    grade 2  . Endometriosis    with Menometrorhaghia   . Gastric polyps 09/30/2015  . Gastroesophageal reflux disease with hiatal hernia    Cough Sees Dr Laretta Bolster   . GERD (gastroesophageal reflux disease)   . Hemorrhoids   . History of colonic polyps   . History of colonic polyps 09/30/2015   adenoma  . HTN (hypertension) 04/23/2013  . Hypocalcemia 09/30/2015  . Hyponatremia 11/23/2016  . IBS (irritable bowel syndrome)   . Measles   . Mumps   . Muscle spasm 09/30/2015  . Osteopenia 10/09/2015  . Rectocele    grade 2  . Reflux    cough  . Rosacea   . Sternal fracture    1986  . Sun-damaged skin 09/05/2014  . Vertigo 11/23/2016  . Vitamin D deficiency 04/06/2016  . 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 APPENDECTOMY  1989  . atypical nevus excision    . BREAST BIOPSY    . CHOLECYSTECTOMY  1989  . COLONOSCOPY  2011  . COLONOSCOPY WITH PROPOFOL N/A 05/27/2015   Procedure: COLONOSCOPY WITH PROPOFOL;  Surgeon: Clarene Essex, MD;   Location: [facility];  Service: Endoscopy;  Laterality: N/A;  . CYSTOSCOPY W/ URETERAL STENT REMOVAL  1999  . ESOPHAGOGASTRODUODENOSCOPY  2011   with esophageal stent, perforation   . ESOPHAGOGASTRODUODENOSCOPY (EGD) WITH PROPOFOL N/A 05/27/2015   Procedure: ESOPHAGOGASTRODUODENOSCOPY (EGD) WITH PROPOFOL;  Surgeon: Clarene Essex, MD;  Location: [facility];  Service: Endoscopy;  Laterality: N/A;  . HEMORRHOID SURGERY  2005  . LEFT OOPHORECTOMY  6720   complicated by ureteral injuery requiring a ureteral stent   . MMK / ANTERIOR VESICOURETHROPEXY / URETHROPEXY  2000  . TONSILLECTOMY AND ADENOIDECTOMY    . TOTAL ABDOMINAL HYSTERECTOMY  2000  . TUBAL LIGATION  1981  . wisdom teeth removal  1964    Family History  Problem Relation Age of Onset  . Colon polyps Father   . Hypertension Father   . Heart failure Father   . Melanoma Father        multiple  . Heart attack Father        x2  . Heart disease Father        Bundle Block  . Meniere's disease Father   . Cancer Father        melanoma  . Osteoporosis Mother   . Transient ischemic attack Mother   .  Cancer Mother        colon  . Heart disease Mother        afib  . Hyperlipidemia Mother   . Diabetes Paternal Grandmother   . Stroke Maternal Grandmother   . Cancer Maternal Grandmother        skin, melanoma, sarcoma  . Colon polyps Maternal Grandmother   . Diverticulitis Maternal Grandmother   . Stroke Maternal Grandfather   . Heart disease Maternal Grandfather   . Obesity Brother     Social History   Socioeconomic History  . Marital status: Married    Spouse name: Not on file  . Number of children: Not on file  . Years of education: Not on file  . Highest education level: Not on file  Occupational History  . Not on file  Social Needs  . Financial resource strain: Not on file  . Food insecurity:    Worry: Not on file    Inability: Not on file  . Transportation needs:    Medical: Not on file    Non-medical: Not on  file  Tobacco Use  . Smoking status: Never Smoker  . Smokeless tobacco: Never Used  Substance and Sexual Activity  . Alcohol use: No  . Drug use: No  . 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 Physical activity:    Days per week: Not on file    Minutes per session: Not on file  . Stress: Not on file  Relationships  . Social connections:    Talks on phone: Not on file    Gets together: Not on file    Attends religious service: Not on file    Active member of club or organization: Not on file    Attends meetings of clubs or organizations: Not on file    Relationship status: Not on file  . Intimate partner violence:    Fear of current or ex partner: Not on file    Emotionally abused: Not on file    Physically abused: Not on file    Forced sexual activity: Not on file  Other Topics Concern  . Not on file  Social History Narrative  . Not on file    Outpatient Medications Prior to Visit  Medication Sig Dispense Refill  . acetaminophen (TYLENOL) 500 MG tablet Take 1,000 mg by mouth every 6 (six) hours as needed for headache (pain).    . benzocaine (ORAJEL) 10 % mucosal gel Use as directed 1 application in the mouth or throat as needed for mouth pain. Reported on 07/22/2015    . cholecalciferol (VITAMIN D) 1000 units tablet Take 1,000 Units by mouth daily.    Marland Kitchen esomeprazole (NEXIUM) 40 MG capsule Take 1 capsule (40 mg total) by mouth 2 (two) times daily before a meal. 180 capsule 3  . fexofenadine (ALLEGRA) 180 MG tablet Take 180 mg by mouth every morning.     . Lidocaine-Hydrocortisone Ace 3-0.5 % CREA Apply 1 application topically daily as needed (hemmoriods.).     Marland Kitchen meclizine (ANTIVERT) 25 MG tablet Take 1 tablet (25 mg total) by mouth 3 (three) times daily as needed for dizziness. 30 tablet 2  . ondansetron (ZOFRAN) 8 MG tablet Take 1 tablet (8 mg total) by mouth every 8 (eight) hours as needed for nausea or  vomiting. 90 tablet 1  . ranitidine (ZANTAC) 150 MG tablet Take 150 mg by mouth 4 (four)  times daily.    Marland Kitchen SIMPLY SALINE NA Place 1 each into the nose daily.     . metoprolol succinate (TOPROL-XL) 50 MG 24 hr tablet TAKE 1 TABLET BY MOUTH DAILY 90 tablet 3  . mometasone (NASONEX) 50 MCG/ACT nasal spray Place 1-2 sprays into the nose daily as needed. Reported on 07/22/2015 17 g 5  . azelastine (ASTELIN) 0.1 % nasal spray Place 2 sprays into both nostrils 2 (two) times daily. Use in each nostril as directed 30 mL 12  . ferrous fumarate (HEMOCYTE - 106 MG FE) 325 (106 Fe) MG TABS tablet Take 1 tablet (106 mg of iron total) by mouth daily. (Patient not taking: Reported on 05/24/2017) 30 each 3   No facility-administered medications prior to visit.     Allergies  Allergen Reactions  . Cefdinir     HIVES  . Celebrex [Celecoxib]     ABDOMINAL PAIN   . Ciprofloxacin     ABDOMINAL PAIN   . Codeine     NAUSEA VOMITING - can take with zofran  . Pseudoephedrine Hcl Er     TACHYCARDIA   . Singulair [Montelukast Sodium] Other (See Comments)    Mental status changes, irritable    Review of Systems  Constitutional: Positive for malaise/fatigue. Negative for fever.  HENT: Positive for congestion.   Eyes: Negative for blurred vision.  Respiratory: Negative for shortness of breath.   Cardiovascular: Negative for chest pain, palpitations and leg swelling.  Gastrointestinal: Negative for abdominal pain, blood in stool and nausea.  Genitourinary: Negative for dysuria and frequency.  Musculoskeletal: Negative for falls.  Skin: Negative for rash.  Neurological: Positive for dizziness and headaches. Negative for loss of consciousness.  Endo/Heme/Allergies: Negative for environmental allergies.  Psychiatric/Behavioral: Negative for depression. The patient is not nervous/anxious.        Objective:    Physical Exam  Constitutional: She is oriented to person, place, and time. No distress.  HENT:    Head: Normocephalic and atraumatic.  Eyes: Pupils are equal, round, and reactive to light. Conjunctivae are normal.  2 beats of nystagmus with leftward gaze.   Neck: Neck supple. No thyromegaly present.  Cardiovascular: Normal rate, regular rhythm and normal heart sounds.  No murmur heard. Pulmonary/Chest: Effort normal and breath sounds normal. She has no wheezes.  Abdominal: She exhibits no distension and no mass.  Musculoskeletal: She exhibits no edema.  Lymphadenopathy:    She has no cervical adenopathy.  Neurological: She is alert and oriented to person, place, and time.  Skin: Skin is warm and dry. No rash noted. She is not diaphoretic.  Psychiatric: Judgment normal.    BP 136/84   Pulse 72   Temp 98.1 F (36.7 C) (Oral)   Resp 18   Wt 167 lb (75.8 kg)   SpO2 98%   BMI 31.53 kg/m  Wt Readings from Last 3 Encounters:  09/13/17 167 lb (75.8 kg)  05/24/17 170 lb 9.6 oz (77.4 kg)  11/23/16 163 lb 12.8 oz (74.3 kg)   BP Readings from Last 3 Encounters:  09/15/17 136/84  05/24/17 (!) 144/84  11/23/16 134/78     Immunization History  Administered Date(s) Administered  . Hepatitis A 01/21/2004, 10/12/2004  . Hepatitis B 01/14/2004, 03/01/2004, 10/12/2004  . Influenza Split 12/28/2014  . Influenza Whole 12/27/2010  . Influenza, High Dose Seasonal PF 12/19/2015, 01/07/2017  . Influenza-Unspecified 12/25/2011, 12/09/2012, 01/11/2014  . Pneumococcal Conjugate-13 09/30/2015  . Pneumococcal Polysaccharide-23 11/23/2016  . Td 05/24/1997, 07/11/2007  .  Zoster 12/26/2012  . Zoster Recombinat (Shingrix) 03/01/2017, 07/26/2017    Health Maintenance  Topic Date Due  . Samul Dada  07/10/2017  . INFLUENZA VACCINE  10/24/2017  . MAMMOGRAM  01/19/2019  . COLONOSCOPY  05/26/2025  . DEXA SCAN  Completed  . Hepatitis C Screening  Completed  . PNA vac Low Risk Adult  Completed    Lab Results  Component Value Date   WBC 5.8 09/13/2017   HGB 11.9 (L) 09/13/2017   HCT 35.9  (L) 09/13/2017   PLT 257.0 09/13/2017   GLUCOSE 79 09/13/2017   CHOL 183 09/13/2017   TRIG 139.0 09/13/2017   HDL 63.10 09/13/2017   LDLCALC 92 09/13/2017   ALT 16 09/13/2017   AST 18 09/13/2017   NA 139 09/13/2017   K 4.0 09/13/2017   CL 104 09/13/2017   CREATININE 0.70 09/13/2017   BUN 12 09/13/2017   CO2 26 09/13/2017   TSH 0.79 09/13/2017   INR 1.19 02/07/2010   HGBA1C 6.0 09/13/2017    Lab Results  Component Value Date   TSH 0.79 09/13/2017   Lab Results  Component Value Date   WBC 5.8 09/13/2017   HGB 11.9 (L) 09/13/2017   HCT 35.9 (L) 09/13/2017   MCV 81.5 09/13/2017   PLT 257.0 09/13/2017   Lab Results  Component Value Date   NA 139 09/13/2017   K 4.0 09/13/2017   CO2 26 09/13/2017   GLUCOSE 79 09/13/2017   BUN 12 09/13/2017   CREATININE 0.70 09/13/2017   BILITOT 0.4 09/13/2017   ALKPHOS 54 09/13/2017   AST 18 09/13/2017   ALT 16 09/13/2017   PROT 6.9 09/13/2017   ALBUMIN 4.1 09/13/2017   CALCIUM 9.3 09/13/2017   ANIONGAP 8 05/31/2016   GFR 88.72 09/13/2017   Lab Results  Component Value Date   CHOL 183 09/13/2017   Lab Results  Component Value Date   HDL 63.10 09/13/2017   Lab Results  Component Value Date   LDLCALC 92 09/13/2017   Lab Results  Component Value Date   TRIG 139.0 09/13/2017   Lab Results  Component Value Date   CHOLHDL 3 09/13/2017   Lab Results  Component Value Date   HGBA1C 6.0 09/13/2017         Assessment & Plan:   Problem List Items Addressed This Visit    HTN (hypertension) (Chronic)    adequately controlled, no changes to meds. Encouraged heart healthy diet such as the DASH diet and exercise as tolerated.       Relevant Medications   metoprolol succinate (TOPROL-XL) 50 MG 24 hr tablet   Anemia    Mild, improving, Increase leafy greens, consider increased lean red meat and using cast iron cookware. Continue to monitor, report any concerns      Vitamin D deficiency   Relevant Orders   VITAMIN D  25 Hydroxy (Vit-D Deficiency, Fractures) (Completed)   Hyponatremia    resolved      Vertigo - Primary    Persistent and bad enough to cause her to stumble and nearly fall on a frequent basis. She has been noting it is worse with quick movements and turning to the left. Has some intermittent headache as well. Will proceed with CT head and refer to ENT for further evaluation. If no source is identified will proceed with neurology referral.       Relevant Orders   Ambulatory referral to ENT   CT Head Wo Contrast   Hyperlipidemia  Encouraged heart healthy diet, increase exercise, avoid trans fats, consider a krill oil cap daily      Relevant Medications   metoprolol succinate (TOPROL-XL) 50 MG 24 hr tablet   Other Relevant Orders   Lipid panel (Completed)   Hyperglycemia    hgba1c acceptable, minimize simple carbs. Increase exercise as tolerated.       Relevant Orders   Hemoglobin A1c (Completed)    Other Visit Diagnoses    Intractable headache, unspecified chronicity pattern, unspecified headache type       Relevant Medications   metoprolol succinate (TOPROL-XL) 50 MG 24 hr tablet   Other Relevant Orders   Ambulatory referral to ENT   CT Head Wo Contrast   CBC (Completed)   Comprehensive metabolic panel (Completed)   TSH (Completed)      I have discontinued Christean Grief. [employer]'s ferrous fumarate and azelastine. I have also changed her metoprolol succinate. Additionally, I am having her maintain her fexofenadine, acetaminophen, benzocaine, Lidocaine-Hydrocortisone Ace, SIMPLY SALINE NA, ranitidine, esomeprazole, cholecalciferol, meclizine, ondansetron, and mometasone.  Meds ordered this encounter  Medications  . metoprolol succinate (TOPROL-XL) 50 MG 24 hr tablet    Sig: Take 1 tablet (50 mg total) by mouth daily. Take with or immediately following a meal.    Dispense:  90 tablet    Refill:  3  . mometasone (NASONEX) 50 MCG/ACT nasal spray    Sig: Place 1-2 sprays into the  nose daily as needed. Reported on 07/22/2015    Dispense:  17 g    Refill:  5    CMA served as scribe during this visit. History, Physical and Plan performed by medical provider. Documentation and orders reviewed and attested to.  Penni Homans, MD

## 2017-09-14 ENCOUNTER — Ambulatory Visit (HOSPITAL_BASED_OUTPATIENT_CLINIC_OR_DEPARTMENT_OTHER)
Admission: RE | Admit: 2017-09-14 | Discharge: 2017-09-14 | Disposition: A | Discharge status: Home / Self Care | Payer: 59 | Source: Ambulatory Visit | Attending: Family Medicine | Admitting: Family Medicine

## 2017-09-14 DIAGNOSIS — R51 Headache: Secondary | ICD-10-CM | POA: Diagnosis not present

## 2017-09-14 DIAGNOSIS — R519 Headache, unspecified: Secondary | ICD-10-CM

## 2017-09-14 DIAGNOSIS — R42 Dizziness and giddiness: Secondary | ICD-10-CM | POA: Insufficient documentation

## 2017-09-15 DIAGNOSIS — R739 Hyperglycemia, unspecified: Secondary | ICD-10-CM | POA: Insufficient documentation

## 2017-09-15 NOTE — Assessment & Plan Note (Signed)
Mild, improving, Increase leafy greens, consider increased lean red meat and using cast iron cookware. Continue to monitor, report any concerns

## 2017-09-15 NOTE — Assessment & Plan Note (Signed)
Persistent and bad enough to cause her to stumble and nearly fall on a frequent basis. She has been noting it is worse with quick movements and turning to the left. Has some intermittent headache as well. Will proceed with CT head and refer to ENT for further evaluation. If no source is identified will proceed with neurology referral.

## 2017-09-15 NOTE — Assessment & Plan Note (Signed)
hgba1c acceptable, minimize simple carbs. Increase exercise as tolerated.  

## 2017-09-15 NOTE — Assessment & Plan Note (Addendum)
adequately controlled, no changes to meds. Encouraged heart healthy diet such as the DASH diet and exercise as tolerated.  

## 2017-09-15 NOTE — Assessment & Plan Note (Signed)
resolved 

## 2017-09-15 NOTE — Assessment & Plan Note (Signed)
Encouraged heart healthy diet, increase exercise, avoid trans fats, consider a krill oil cap daily 

## 2017-09-16 ENCOUNTER — Encounter: Payer: Self-pay | Admitting: Family Medicine

## 2017-10-11 DIAGNOSIS — L821 Other seborrheic keratosis: Secondary | ICD-10-CM | POA: Diagnosis not present

## 2017-10-11 DIAGNOSIS — D2372 Other benign neoplasm of skin of left lower limb, including hip: Secondary | ICD-10-CM | POA: Diagnosis not present

## 2017-10-11 DIAGNOSIS — D225 Melanocytic nevi of trunk: Secondary | ICD-10-CM | POA: Diagnosis not present

## 2017-10-11 DIAGNOSIS — D2261 Melanocytic nevi of right upper limb, including shoulder: Secondary | ICD-10-CM | POA: Diagnosis not present

## 2017-10-11 DIAGNOSIS — L918 Other hypertrophic disorders of the skin: Secondary | ICD-10-CM | POA: Diagnosis not present

## 2017-10-11 DIAGNOSIS — D1801 Hemangioma of skin and subcutaneous tissue: Secondary | ICD-10-CM | POA: Diagnosis not present

## 2017-10-25 DIAGNOSIS — H2513 Age-related nuclear cataract, bilateral: Secondary | ICD-10-CM | POA: Diagnosis not present

## 2017-10-25 DIAGNOSIS — H524 Presbyopia: Secondary | ICD-10-CM | POA: Diagnosis not present

## 2017-10-29 MED FILL — METOPROLOL SUCCINATE ER 50: 50 | 90 days supply | Qty: 90 | Fill #0

## 2017-10-31 MED FILL — MOMETASONE FUROATE 50 MCG S: 50 | 90 days supply | Qty: 51 | Fill #3

## 2017-11-07 MED FILL — ESOMEPRAZOLE MAG DR 40 MG C: 40 | 90 days supply | Qty: 90 | Fill #0

## 2017-11-15 ENCOUNTER — Ambulatory Visit: Payer: 59 | Admitting: Family Medicine

## 2017-11-15 ENCOUNTER — Encounter: Payer: Self-pay | Admitting: Family Medicine

## 2017-11-15 VITALS — BP 138/80 | HR 88 | Temp 98.1°F | Resp 18 | Ht 60.5 in | Wt 163.0 lb

## 2017-11-15 DIAGNOSIS — K589 Irritable bowel syndrome without diarrhea: Secondary | ICD-10-CM | POA: Diagnosis not present

## 2017-11-15 DIAGNOSIS — E559 Vitamin D deficiency, unspecified: Secondary | ICD-10-CM | POA: Diagnosis not present

## 2017-11-15 DIAGNOSIS — K219 Gastro-esophageal reflux disease without esophagitis: Secondary | ICD-10-CM | POA: Diagnosis not present

## 2017-11-15 DIAGNOSIS — R739 Hyperglycemia, unspecified: Secondary | ICD-10-CM

## 2017-11-15 DIAGNOSIS — M8580 Other specified disorders of bone density and structure, unspecified site: Secondary | ICD-10-CM

## 2017-11-15 DIAGNOSIS — K317 Polyp of stomach and duodenum: Secondary | ICD-10-CM

## 2017-11-15 DIAGNOSIS — D649 Anemia, unspecified: Secondary | ICD-10-CM

## 2017-11-15 DIAGNOSIS — Z23 Encounter for immunization: Secondary | ICD-10-CM

## 2017-11-15 DIAGNOSIS — K449 Diaphragmatic hernia without obstruction or gangrene: Secondary | ICD-10-CM

## 2017-11-15 DIAGNOSIS — I10 Essential (primary) hypertension: Secondary | ICD-10-CM

## 2017-11-15 DIAGNOSIS — R42 Dizziness and giddiness: Secondary | ICD-10-CM

## 2017-11-15 DIAGNOSIS — M791 Myalgia, unspecified site: Secondary | ICD-10-CM

## 2017-11-15 DIAGNOSIS — E2839 Other primary ovarian failure: Secondary | ICD-10-CM | POA: Diagnosis not present

## 2017-11-15 LAB — MAGNESIUM: Magnesium: 2 mg/dL (ref 1.5–2.5)

## 2017-11-15 LAB — COMPREHENSIVE METABOLIC PANEL
ALBUMIN: 4.3 g/dL (ref 3.5–5.2)
ALK PHOS: 54 U/L (ref 39–117)
ALT: 17 U/L (ref 0–35)
AST: 17 U/L (ref 0–37)
BILIRUBIN TOTAL: 0.4 mg/dL (ref 0.2–1.2)
BUN: 18 mg/dL (ref 6–23)
CALCIUM: 9.7 mg/dL (ref 8.4–10.5)
CO2: 28 meq/L (ref 19–32)
CREATININE: 0.79 mg/dL (ref 0.40–1.20)
Chloride: 102 mEq/L (ref 96–112)
GFR: 77.12 mL/min (ref >60.00)
Glucose, Bld: 87 mg/dL (ref 70–99)
Potassium: 3.9 mEq/L (ref 3.5–5.1)
Sodium: 138 mEq/L (ref 135–145)
Total Protein: 7.1 g/dL (ref 6.0–8.3)

## 2017-11-15 LAB — VITAMIN B12: VITAMIN B 12: 413 pg/mL (ref 211–911)

## 2017-11-15 MED ORDER — ESOMEPRAZOLE MAGNESIUM 20 MG PO CPDR: 20.0000 mg | DELAYED_RELEASE_CAPSULE | Freq: Every day | ORAL | 2 refills | Status: DC

## 2017-11-15 MED FILL — ESOMEPRAZOLE MAGNESIUM 20 M: 20 | 30 days supply | Qty: 30 | Fill #0

## 2017-11-15 NOTE — Patient Instructions (Signed)
Consider Benefiber once or twice daily Vertigo Vertigo means that you feel like you are moving when you are not. Vertigo can also make you feel like things around you are moving when they are not. This feeling can come and go at any time. Vertigo often goes away on its own. Follow these instructions at home:  Avoid making fast movements.  Avoid driving.  Avoid using heavy machinery.  Avoid doing any task or activity that might cause danger to you or other people if you would have a vertigo attack while you are doing it.  Sit down right away if you feel dizzy or have trouble with your balance.  Take over-the-counter and prescription medicines only as told by your doctor.  Follow instructions from your doctor about which positions or movements you should avoid.  Drink enough fluid to keep your pee (urine) clear or pale yellow.  Keep all follow-up visits as told by your doctor. This is important. Contact a doctor if:  Medicine does not help your vertigo.  You have a fever.  Your problems get worse or you have new symptoms.  Your family or friends see changes in your behavior.  You feel sick to your stomach (nauseous) or you throw up (vomit).  You have a "pins and needles" feeling or you are numb in part of your body. Get help right away if:  You have trouble moving or talking.  You are always dizzy.  You pass out (faint).  You get very bad headaches.  You feel weak or have trouble using your hands, arms, or legs.  You have changes in your hearing.  You have changes in your seeing (vision).  You get a stiff neck.  Bright light starts to bother you. This information is not intended to replace advice given to you by your health care provider. Make sure you discuss any questions you have with your health care provider. Document Released: 12/20/2007 Document Revised: 08/18/2015 Document Reviewed: 07/05/2014 Elsevier Interactive Patient Education  [employer].

## 2017-11-15 NOTE — Progress Notes (Signed)
Subjective:  I acted as a Education administrator for Dr. Charlett Blake. [place], [place]  Patient ID: Carrie Elliott, female    DOB: [birth year], 67 y.o.   MRN: [id number]  No chief complaint on file.   HPI  Patient is in today for a 2 month follow up and she is accompanied by her husband. They are noting persistent head congestion some recurrent episodes of vertigo with numerous episodes of nearly falling. She also notes consistent stomach trouble and she is following with her gastroenterologist Dr Watt Climes. Denies CP/palp/SOB/HA/fevers or GU c/o. Taking meds as prescribed  Patient Care Team: Mosie Lukes, MD as PCP - General (Family Medicine)   Past Medical History:  Diagnosis Date  . Allergic rhinitis   . Allergy   . Anemia 08/24/2014  . Arthritis    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, shoulders, knees  . Benign paroxysmal positional vertigo 07/12/2013  . Chicken pox   . Cystocele    grade 2  . Endometriosis    with Menometrorhaghia   . Gastric polyps 09/30/2015  . Gastroesophageal reflux disease with hiatal hernia    Cough Sees Dr Laretta Bolster   . GERD (gastroesophageal reflux disease)   . Hemorrhoids   . History of colonic polyps   . History of colonic polyps 09/30/2015   adenoma  . HTN (hypertension) 04/23/2013  . Hypocalcemia 09/30/2015  . Hyponatremia 11/23/2016  . IBS (irritable bowel syndrome)   . Measles   . Mumps   . Muscle spasm 09/30/2015  . Osteopenia 10/09/2015  . Rectocele    grade 2  . Reflux    cough  . Rosacea   . Sternal fracture    1986  . Sun-damaged skin 09/05/2014  . Vertigo 11/23/2016  . Vitamin D deficiency 04/06/2016  . 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 APPENDECTOMY  1989  . atypical nevus excision    . BREAST BIOPSY    . CHOLECYSTECTOMY  1989  . COLONOSCOPY  2011  . COLONOSCOPY WITH PROPOFOL N/A 05/27/2015   Procedure: COLONOSCOPY WITH PROPOFOL;  Surgeon: Clarene Essex, MD;  Location: [facility];  Service: Endoscopy;  Laterality: N/A;  .  CYSTOSCOPY W/ URETERAL STENT REMOVAL  1999  . ESOPHAGOGASTRODUODENOSCOPY  2011   with esophageal stent, perforation   . ESOPHAGOGASTRODUODENOSCOPY (EGD) WITH PROPOFOL N/A 05/27/2015   Procedure: ESOPHAGOGASTRODUODENOSCOPY (EGD) WITH PROPOFOL;  Surgeon: Clarene Essex, MD;  Location: [facility];  Service: Endoscopy;  Laterality: N/A;  . HEMORRHOID SURGERY  2005  . LEFT OOPHORECTOMY  5638   complicated by ureteral injuery requiring a ureteral stent   . MMK / ANTERIOR VESICOURETHROPEXY / URETHROPEXY  2000  . TONSILLECTOMY AND ADENOIDECTOMY    . TOTAL ABDOMINAL HYSTERECTOMY  2000  . TUBAL LIGATION  1981  . wisdom teeth removal  1964    Family History  Problem Relation Age of Onset  . Colon polyps Father   . Hypertension Father   . Heart failure Father   . Melanoma Father        multiple  . Heart attack Father        x2  . Heart disease Father        Bundle Block  . Meniere's disease Father   . Cancer Father        melanoma  . Osteoporosis Mother   . Transient ischemic attack Mother   . Cancer Mother        colon  . Heart disease Mother  afib  . Hyperlipidemia Mother   . Diabetes Paternal Grandmother   . Stroke Maternal Grandmother   . Cancer Maternal Grandmother        skin, melanoma, sarcoma  . Colon polyps Maternal Grandmother   . Diverticulitis Maternal Grandmother   . Stroke Maternal Grandfather   . Heart disease Maternal Grandfather   . Obesity Brother     Social History   Socioeconomic History  . Marital status: Married    Spouse name: Not on file  . Number of children: Not on file  . Years of education: Not on file  . Highest education level: Not on file  Occupational History  . Not on file  Social Needs  . Financial resource strain: Not on file  . Food insecurity:    Worry: Not on file    Inability: Not on file  . Transportation needs:    Medical: Not on file    Non-medical: Not on file  Tobacco Use  . Smoking status: Never Smoker  . Smokeless  tobacco: Never Used  Substance and Sexual Activity  . Alcohol use: No  . Drug use: No  . 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 Physical activity:    Days per week: Not on file    Minutes per session: Not on file  . Stress: Not on file  Relationships  . Social connections:    Talks on phone: Not on file    Gets together: Not on file    Attends religious service: Not on file    Active member of club or organization: Not on file    Attends meetings of clubs or organizations: Not on file    Relationship status: Not on file  . Intimate partner violence:    Fear of current or ex partner: Not on file    Emotionally abused: Not on file    Physically abused: Not on file    Forced sexual activity: Not on file  Other Topics Concern  . Not on file  Social History Narrative  . Not on file    Outpatient Medications Prior to Visit  Medication Sig Dispense Refill  . acetaminophen (TYLENOL) 500 MG tablet Take 1,000 mg by mouth every 6 (six) hours as needed for headache (pain).    . benzocaine (ORAJEL) 10 % mucosal gel Use as directed 1 application in the mouth or throat as needed for mouth pain. Reported on 07/22/2015    . fexofenadine (ALLEGRA) 180 MG tablet Take 180 mg by mouth as needed.     . Lidocaine-Hydrocortisone Ace 3-0.5 % CREA Apply 1 application topically daily as needed (hemmoriods.).     Marland Kitchen meclizine (ANTIVERT) 25 MG tablet Take 1 tablet (25 mg total) by mouth 3 (three) times daily as needed for dizziness. 30 tablet 2  . metoprolol succinate (TOPROL-XL) 50 MG 24 hr tablet Take 1 tablet (50 mg total) by mouth daily. Take with or immediately following a meal. 90 tablet 3  . mometasone (NASONEX) 50 MCG/ACT nasal spray Place 1-2 sprays into the nose daily as needed. Reported on 07/22/2015 17 g 5  . ondansetron (ZOFRAN) 8 MG tablet Take 1 tablet (8 mg total) by mouth every 8 (eight) hours as needed for nausea or  vomiting. 90 tablet 1  . ranitidine (ZANTAC) 150 MG tablet Take 150 mg by mouth 4 (four) times daily.    Marland Kitchen SIMPLY SALINE NA Place 1  each into the nose daily.     . cholecalciferol (VITAMIN D) 1000 units tablet Take 1,000 Units by mouth daily.    Marland Kitchen esomeprazole (NEXIUM) 40 MG capsule Take 1 capsule (40 mg total) by mouth 2 (two) times daily before a meal. 180 capsule 3   No facility-administered medications prior to visit.     Allergies  Allergen Reactions  . Cefdinir     HIVES  . Celebrex [Celecoxib]     ABDOMINAL PAIN   . Ciprofloxacin     ABDOMINAL PAIN   . Codeine     NAUSEA VOMITING - can take with zofran  . Pseudoephedrine Hcl Er     TACHYCARDIA   . Singulair [Montelukast Sodium] Other (See Comments)    Mental status changes, irritable    Review of Systems  Constitutional: Negative for fever and malaise/fatigue.  HENT: Positive for congestion.   Eyes: Negative for blurred vision.  Respiratory: Negative for shortness of breath.   Cardiovascular: Negative for chest pain, palpitations and leg swelling.  Gastrointestinal: Positive for abdominal pain and nausea. Negative for blood in stool.  Genitourinary: Negative for dysuria and frequency.  Musculoskeletal: Negative for falls.  Skin: Negative for rash.  Neurological: Positive for dizziness. Negative for speech change, loss of consciousness and headaches.  Endo/Heme/Allergies: Negative for environmental allergies.  Psychiatric/Behavioral: Negative for depression. The patient is not nervous/anxious.        Objective:    Physical Exam  Constitutional: She is oriented to person, place, and time. She appears well-developed and well-nourished. No distress.  HENT:  Head: Normocephalic and atraumatic.  Nose: Nose normal.  Eyes: Right eye exhibits no discharge. Left eye exhibits no discharge.  Neck: Normal range of motion. Neck supple.  Cardiovascular: Normal rate and regular rhythm.  No murmur heard. Pulmonary/Chest:  Effort normal and breath sounds normal.  Abdominal: Soft. Bowel sounds are normal. There is no tenderness.  Musculoskeletal: She exhibits no edema.  Neurological: She is alert and oriented to person, place, and time. She displays normal reflexes. No cranial nerve deficit. She exhibits normal muscle tone. Coordination normal.  Skin: Skin is warm and dry.  Psychiatric: She has a normal mood and affect.  Nursing note and vitals reviewed.   BP 138/80 (BP Location: Left Arm, Patient Position: Sitting, Cuff Size: Normal)   Pulse 88   Temp 98.1 F (36.7 C) (Oral)   Resp 18   Ht 5' 0.5" (1.537 m)   Wt 163 lb (73.9 kg)   SpO2 97%   BMI 31.31 kg/m  Wt Readings from Last 3 Encounters:  11/15/17 163 lb (73.9 kg)  09/13/17 167 lb (75.8 kg)  05/24/17 170 lb 9.6 oz (77.4 kg)   BP Readings from Last 3 Encounters:  11/15/17 138/80  09/15/17 136/84  05/24/17 (!) 144/84     Immunization History  Administered Date(s) Administered  . Hepatitis A 01/21/2004, 10/12/2004  . Hepatitis B 01/14/2004, 03/01/2004, 10/12/2004  . Influenza Split 12/28/2014  . Influenza Whole 12/27/2010  . Influenza, High Dose Seasonal PF 12/19/2015, 01/07/2017  . Influenza-Unspecified 12/25/2011, 12/09/2012, 01/11/2014  . Pneumococcal Conjugate-13 09/30/2015  . Pneumococcal Polysaccharide-23 11/23/2016  . Td 05/24/1997, 07/11/2007  . Tdap 11/15/2017  . Zoster 12/26/2012  . Zoster Recombinat (Shingrix) 03/01/2017, 07/26/2017    Health Maintenance  Topic Date Due  . INFLUENZA VACCINE  10/24/2017  . MAMMOGRAM  01/19/2019  . COLONOSCOPY  05/26/2025  . TETANUS/TDAP  11/16/2027  . DEXA SCAN  Completed  . Hepatitis C Screening  Completed  . PNA vac Low Risk Adult  Completed    Lab Results  Component Value Date   WBC 5.8 09/13/2017   HGB 11.9 (L) 09/13/2017   HCT 35.9 (L) 09/13/2017   PLT 257.0 09/13/2017   GLUCOSE 87 11/15/2017   CHOL 183 09/13/2017   TRIG 139.0 09/13/2017   HDL 63.10 09/13/2017   LDLCALC  92 09/13/2017   ALT 17 11/15/2017   AST 17 11/15/2017   NA 138 11/15/2017   K 3.9 11/15/2017   CL 102 11/15/2017   CREATININE 0.79 11/15/2017   BUN 18 11/15/2017   CO2 28 11/15/2017   TSH 0.79 09/13/2017   INR 1.19 02/07/2010   HGBA1C 6.0 09/13/2017    Lab Results  Component Value Date   TSH 0.79 09/13/2017   Lab Results  Component Value Date   WBC 5.8 09/13/2017   HGB 11.9 (L) 09/13/2017   HCT 35.9 (L) 09/13/2017   MCV 81.5 09/13/2017   PLT 257.0 09/13/2017   Lab Results  Component Value Date   NA 138 11/15/2017   K 3.9 11/15/2017   CO2 28 11/15/2017   GLUCOSE 87 11/15/2017   BUN 18 11/15/2017   CREATININE 0.79 11/15/2017   BILITOT 0.4 11/15/2017   ALKPHOS 54 11/15/2017   AST 17 11/15/2017   ALT 17 11/15/2017   PROT 7.1 11/15/2017   ALBUMIN 4.3 11/15/2017   CALCIUM 9.7 11/15/2017   ANIONGAP 8 05/31/2016   GFR 77.12 11/15/2017   Lab Results  Component Value Date   CHOL 183 09/13/2017   Lab Results  Component Value Date   HDL 63.10 09/13/2017   Lab Results  Component Value Date   LDLCALC 92 09/13/2017   Lab Results  Component Value Date   TRIG 139.0 09/13/2017   Lab Results  Component Value Date   CHOLHDL 3 09/13/2017   Lab Results  Component Value Date   HGBA1C 6.0 09/13/2017         Assessment & Plan:   Problem List Items Addressed This Visit    HTN (hypertension) (Chronic)    Well controlled, no changes to meds. Encouraged heart healthy diet such as the DASH diet and exercise as tolerated.       Gastroesophageal reflux disease with hiatal hernia    Following [facility] gastroenteorlogy Dr Watt Climes. Avoid offending foods, start probiotics. Do not eat large meals in late evening and consider raising head of bed.       Relevant Medications   esomeprazole (NEXIUM) 20 MG capsule   IBS (irritable bowel syndrome)   Relevant Medications   esomeprazole (NEXIUM) 20 MG capsule   Other Relevant Orders   Vitamin B12 (Completed)   Anemia     Increase leafy greens, consider increased lean red meat and using cast iron cookware. Continue to monitor, report any concerns      Relevant Orders   Vitamin B12 (Completed)   Hypocalcemia   Relevant Orders   DG Bone Density   Gastric polyps - Primary   Osteopenia   Relevant Orders   DG Bone Density   Vitamin D deficiency   Relevant Orders   DG Bone Density   Vertigo    Encouraged to hydrate well. Meclizine prn. Repot persistent symptoms.       Hyperglycemia    hgba1c acceptable, minimize simple carbs. Increase exercise as tolerated.       Other Visit Diagnoses    Estrogen deficiency       Relevant Orders  DG Bone Density   Myalgia       Relevant Orders   Magnesium (Completed)   Comprehensive metabolic panel (Completed)      I have discontinued Christean Grief. Schaff's esomeprazole and cholecalciferol. I am also having her start on esomeprazole. Additionally, I am having her maintain her fexofenadine, acetaminophen, benzocaine, Lidocaine-Hydrocortisone Ace, SIMPLY SALINE NA, ranitidine, meclizine, ondansetron, metoprolol succinate, and mometasone.  Meds ordered this encounter  Medications  . esomeprazole (NEXIUM) 20 MG capsule    Sig: Take 1 capsule (20 mg total) by mouth daily at 12 noon.    Dispense:  30 capsule    Refill:  2    CMA served as scribe during this visit. History, Physical and Plan performed by medical provider. Documentation and orders reviewed and attested to.  Penni Homans, MD

## 2017-11-17 NOTE — Assessment & Plan Note (Signed)
Increase leafy greens, consider increased lean red meat and using cast iron cookware. Continue to monitor, report any concerns 

## 2017-11-17 NOTE — Assessment & Plan Note (Signed)
hgba1c acceptable, minimize simple carbs. Increase exercise as tolerated.  

## 2017-11-17 NOTE — Assessment & Plan Note (Signed)
Encouraged to hydrate well. Meclizine prn. Repot persistent symptoms.

## 2017-11-17 NOTE — Assessment & Plan Note (Signed)
Following wi[facility] gastroenteorlogy Dr Watt Climes. Avoid offending foods, start probiotics. Do not eat large meals in late evening and consider raising head of bed.

## 2017-11-17 NOTE — Assessment & Plan Note (Signed)
Well controlled, no changes to meds. Encouraged heart healthy diet such as the DASH diet and exercise as tolerated.  °

## 2017-11-20 ENCOUNTER — Encounter: Payer: Self-pay | Admitting: Family Medicine

## 2017-11-21 ENCOUNTER — Other Ambulatory Visit: Payer: Self-pay | Admitting: Family Medicine

## 2017-11-21 DIAGNOSIS — M25572 Pain in left ankle and joints of left foot: Secondary | ICD-10-CM

## 2017-11-22 ENCOUNTER — Ambulatory Visit (HOSPITAL_BASED_OUTPATIENT_CLINIC_OR_DEPARTMENT_OTHER)
Admission: RE | Admit: 2017-11-22 | Discharge: 2017-11-22 | Disposition: A | Discharge status: Home / Self Care | Payer: 59 | Source: Ambulatory Visit | Attending: Family Medicine | Admitting: Family Medicine

## 2017-11-22 DIAGNOSIS — M25572 Pain in left ankle and joints of left foot: Secondary | ICD-10-CM | POA: Diagnosis not present

## 2017-11-22 DIAGNOSIS — E559 Vitamin D deficiency, unspecified: Secondary | ICD-10-CM | POA: Diagnosis not present

## 2017-11-22 DIAGNOSIS — M7732 Calcaneal spur, left foot: Secondary | ICD-10-CM | POA: Insufficient documentation

## 2017-11-22 DIAGNOSIS — M8588 Other specified disorders of bone density and structure, other site: Secondary | ICD-10-CM | POA: Insufficient documentation

## 2017-11-22 DIAGNOSIS — Z780 Asymptomatic menopausal state: Secondary | ICD-10-CM | POA: Diagnosis not present

## 2017-11-22 DIAGNOSIS — M8580 Other specified disorders of bone density and structure, unspecified site: Secondary | ICD-10-CM | POA: Diagnosis not present

## 2017-11-22 DIAGNOSIS — E2839 Other primary ovarian failure: Secondary | ICD-10-CM | POA: Insufficient documentation

## 2017-11-25 ENCOUNTER — Encounter: Payer: Self-pay | Admitting: Family Medicine

## 2017-12-09 MED FILL — ESOMEPRAZOLE MAGNESIUM 20 M: 20 | 30 days supply | Qty: 30 | Fill #1

## 2018-01-03 ENCOUNTER — Other Ambulatory Visit: Payer: Self-pay | Admitting: Family Medicine

## 2018-01-03 DIAGNOSIS — K219 Gastro-esophageal reflux disease without esophagitis: Secondary | ICD-10-CM | POA: Diagnosis not present

## 2018-01-03 DIAGNOSIS — Z86010 Personal history of colonic polyps: Secondary | ICD-10-CM | POA: Diagnosis not present

## 2018-01-03 DIAGNOSIS — Z1231 Encounter for screening mammogram for malignant neoplasm of breast: Secondary | ICD-10-CM

## 2018-01-09 MED FILL — FAMOTIDINE 20 MG TABLET: 20 | 60 days supply | Qty: 120 | Fill #0

## 2018-01-13 MED FILL — ESOMEPRAZOLE MAGNESIUM 20 M: 20 | 30 days supply | Qty: 30 | Fill #2

## 2018-01-29 MED FILL — METOPROLOL SUCCINATE ER 50: 50 | 30 days supply | Qty: 30 | Fill #1

## 2018-02-03 MED FILL — MOMETASONE FUROATE 50 MCG S: 50 | 30 days supply | Qty: 17 | Fill #1

## 2018-02-04 ENCOUNTER — Encounter: Payer: Self-pay | Admitting: Family Medicine

## 2018-02-04 MED ORDER — ESOMEPRAZOLE MAGNESIUM 20 MG PO CPDR: 20.0000 mg | DELAYED_RELEASE_CAPSULE | Freq: Every day | ORAL | 3 refills | Status: DC

## 2018-02-06 MED FILL — LIDOCAINE-HC 3-0.5% CREAM: 3-0.5 | 30 days supply | Qty: 98 | Fill #0

## 2018-02-06 MED FILL — ESOMEPRAZOLE MAG DR 40 MG C: 40 | 30 days supply | Qty: 30 | Fill #0

## 2018-02-06 MED FILL — ONDANSETRON HCL 8 MG TAB: 8 | 30 days supply | Qty: 90 | Fill #1

## 2018-02-06 MED FILL — MECLIZINE 25 MG TABLET: 25 | 20 days supply | Qty: 60 | Fill #1

## 2018-02-07 ENCOUNTER — Ambulatory Visit
Admission: RE | Admit: 2018-02-07 | Discharge: 2018-02-07 | Disposition: A | Discharge status: Home / Self Care | Payer: 59 | Source: Ambulatory Visit | Attending: Family Medicine | Admitting: Family Medicine

## 2018-02-07 DIAGNOSIS — Z1231 Encounter for screening mammogram for malignant neoplasm of breast: Secondary | ICD-10-CM

## 2018-02-11 ENCOUNTER — Ambulatory Visit: Payer: 59 | Admitting: Family Medicine

## 2018-02-18 ENCOUNTER — Encounter: Payer: Self-pay | Admitting: Family Medicine

## 2018-02-18 ENCOUNTER — Other Ambulatory Visit (HOSPITAL_COMMUNITY)
Admission: RE | Admit: 2018-02-18 | Discharge: 2018-02-18 | Disposition: A | Discharge status: Home / Self Care | Payer: 59 | Source: Ambulatory Visit | Attending: Family Medicine | Admitting: Family Medicine

## 2018-02-18 ENCOUNTER — Ambulatory Visit: Payer: 59 | Admitting: Family Medicine

## 2018-02-18 VITALS — BP 108/68 | HR 75 | Temp 97.7°F | Resp 18 | Ht 60.5 in | Wt 154.6 lb

## 2018-02-18 DIAGNOSIS — E538 Deficiency of other specified B group vitamins: Secondary | ICD-10-CM | POA: Diagnosis not present

## 2018-02-18 DIAGNOSIS — I10 Essential (primary) hypertension: Secondary | ICD-10-CM

## 2018-02-18 DIAGNOSIS — R739 Hyperglycemia, unspecified: Secondary | ICD-10-CM | POA: Diagnosis not present

## 2018-02-18 DIAGNOSIS — E559 Vitamin D deficiency, unspecified: Secondary | ICD-10-CM

## 2018-02-18 DIAGNOSIS — Z124 Encounter for screening for malignant neoplasm of cervix: Secondary | ICD-10-CM | POA: Diagnosis not present

## 2018-02-18 DIAGNOSIS — M8580 Other specified disorders of bone density and structure, unspecified site: Secondary | ICD-10-CM

## 2018-02-18 DIAGNOSIS — E785 Hyperlipidemia, unspecified: Secondary | ICD-10-CM

## 2018-02-18 NOTE — Progress Notes (Signed)
0

## 2018-02-18 NOTE — Assessment & Plan Note (Signed)
Supplement and monitor 

## 2018-02-18 NOTE — Assessment & Plan Note (Signed)
Well controlled, no changes to meds. Encouraged heart healthy diet such as the DASH diet and exercise as tolerated.  °

## 2018-02-18 NOTE — Patient Instructions (Signed)
Bone density shows osteopenia, which is thinner than normal but not as bad as osteoporosis. Recommend calcium intake of 1200 to 1500 mg daily, divided into roughly 3 doses. Best source is the diet and a single dairy serving is about 500 mg, a supplement of calcium citrate once or twice daily to balance diet is fine if not getting enough in diet. Also need Vitamin D 2000 IU caps, 1 cap daily if not already taking vitamin D. Also recommend weight baring exercise on hips and upper body to keep bones strong   DASH Eating Plan DASH stands for "Dietary Approaches to Stop Hypertension." The DASH eating plan is a healthy eating plan that has been shown to reduce high blood pressure (hypertension). It may also reduce your risk for type 2 diabetes, heart disease, and stroke. The DASH eating plan may also help with weight loss. What are tips for following this plan? General guidelines  Avoid eating more than 2,300 mg (milligrams) of salt (sodium) a day. If you have hypertension, you may need to reduce your sodium intake to 1,500 mg a day.  Limit alcohol intake to no more than 1 drink a day for nonpregnant women and 2 drinks a day for men. One drink equals 12 oz of beer, 5 oz of wine, or 1 oz of hard liquor.  Work with your health care provider to maintain a healthy body weight or to lose weight. Ask what an ideal weight is for you.  Get at least 30 minutes of exercise that causes your heart to beat faster (aerobic exercise) most days of the week. Activities may include walking, swimming, or biking.  Work with your health care provider or diet and nutrition specialist (dietitian) to adjust your eating plan to your individual calorie needs. Reading food labels  Check food labels for the amount of sodium per serving. Choose foods with less than 5 percent of the Daily Value of sodium. Generally, foods with less than 300 mg of sodium per serving fit into this eating plan.  To find whole grains, look for the  word "whole" as the first word in the ingredient list. Shopping  Buy products labeled as "low-sodium" or "no salt added."  Buy fresh foods. Avoid canned foods and premade or frozen meals. Cooking  Avoid adding salt when cooking. Use salt-free seasonings or herbs instead of table salt or sea salt. Check with your health care provider or pharmacist before using salt substitutes.  Do not fry foods. Cook foods using healthy methods such as baking, boiling, grilling, and broiling instead.  Cook with heart-healthy oils, such as olive, canola, soybean, or sunflower oil. Meal planning   Eat a balanced diet that includes: ? 5 or more servings of fruits and vegetables each day. At each meal, try to fill half of your plate with fruits and vegetables. ? Up to 6-8 servings of whole grains each day. ? Less than 6 oz of lean meat, poultry, or fish each day. A 3-oz serving of meat is about the same size as a deck of cards. One egg equals 1 oz. ? 2 servings of low-fat dairy each day. ? A serving of nuts, seeds, or beans 5 times each week. ? Heart-healthy fats. Healthy fats called Omega-3 fatty acids are found in foods such as flaxseeds and coldwater fish, like sardines, salmon, and mackerel.  Limit how much you eat of the following: ? Canned or prepackaged foods. ? Food that is high in trans fat, such as fried foods. ?  Food that is high in saturated fat, such as fatty meat. ? Sweets, desserts, sugary drinks, and other foods with added sugar. ? Full-fat dairy products.  Do not salt foods before eating.  Try to eat at least 2 vegetarian meals each week.  Eat more home-cooked food and less restaurant, buffet, and fast food.  When eating at a restaurant, ask that your food be prepared with less salt or no salt, if possible. What foods are recommended? The items listed may not be a complete list. Talk with your dietitian about what dietary choices are best for you. Grains Whole-grain or  whole-wheat bread. Whole-grain or whole-wheat pasta. Brown rice. [place]row. Bulgur. Whole-grain and low-sodium cereals. Pita bread. Low-fat, low-sodium crackers. Whole-wheat flour tortillas. Vegetables Fresh or frozen vegetables (raw, steamed, roasted, or grilled). Low-sodium or reduced-sodium tomato and vegetable juice. Low-sodium or reduced-sodium tomato sauce and tomato paste. Low-sodium or reduced-sodium canned vegetables. Fruits All fresh, dried, or frozen fruit. Canned fruit in natural juice (without added sugar). Meat and other protein foods Skinless chicken or [place]. Ground chicken or [place]. Pork with fat trimmed off. Fish and seafood. Egg whites. Dried beans, peas, or lentils. Unsalted nuts, nut butters, and seeds. Unsalted canned beans. Lean cuts of beef with fat trimmed off. Low-sodium, lean deli meat. Dairy Low-fat (1%) or fat-free (skim) milk. Fat-free, low-fat, or reduced-fat cheeses. Nonfat, low-sodium ricotta or cottage cheese. Low-fat or nonfat yogurt. Low-fat, low-sodium cheese. Fats and oils Soft margarine without trans fats. Vegetable oil. Low-fat, reduced-fat, or light mayonnaise and salad dressings (reduced-sodium). Canola, safflower, olive, soybean, and sunflower oils. Avocado. Seasoning and other foods Herbs. Spices. Seasoning mixes without salt. Unsalted popcorn and pretzels. Fat-free sweets. What foods are not recommended? The items listed may not be a complete list. Talk with your dietitian about what dietary choices are best for you. Grains Baked goods made with fat, such as croissants, muffins, or some breads. Dry pasta or rice meal packs. Vegetables Creamed or fried vegetables. Vegetables in a cheese sauce. Regular canned vegetables (not low-sodium or reduced-sodium). Regular canned tomato sauce and paste (not low-sodium or reduced-sodium). Regular tomato and vegetable juice (not low-sodium or reduced-sodium). Angie Fava. Olives. Fruits Canned fruit in a light  or heavy syrup. Fried fruit. Fruit in cream or butter sauce. Meat and other protein foods Fatty cuts of meat. Ribs. Fried meat. Berniece Salines. Sausage. Bologna and other processed lunch meats. Salami. Fatback. Hotdogs. Bratwurst. Salted nuts and seeds. Canned beans with added salt. Canned or smoked fish. Whole eggs or egg yolks. Chicken or [place] with skin. Dairy Whole or 2% milk, cream, and half-and-half. Whole or full-fat cream cheese. Whole-fat or sweetened yogurt. Full-fat cheese. Nondairy creamers. Whipped toppings. Processed cheese and cheese spreads. Fats and oils Butter. Stick margarine. Lard. Shortening. Ghee. Bacon fat. Tropical oils, such as coconut, palm kernel, or palm oil. Seasoning and other foods Salted popcorn and pretzels. Onion salt, garlic salt, seasoned salt, table salt, and sea salt. Worcestershire sauce. Tartar sauce. Barbecue sauce. Teriyaki sauce. Soy sauce, including reduced-sodium. Steak sauce. Canned and packaged gravies. Fish sauce. Oyster sauce. Cocktail sauce. Horseradish that you find on the shelf. Ketchup. Mustard. Meat flavorings and tenderizers. Bouillon cubes. Hot sauce and Tabasco sauce. Premade or packaged marinades. Premade or packaged taco seasonings. Relishes. Regular salad dressings. Where to find more information:  National Heart, Lung, and [place]: https://wilson-eaton.com/  American Heart Association: www.heart.org Summary  The DASH eating plan is a healthy eating plan that has been shown to reduce high blood  pressure (hypertension). It may also reduce your risk for type 2 diabetes, heart disease, and stroke.  With the DASH eating plan, you should limit salt (sodium) intake to 2,300 mg a day. If you have hypertension, you may need to reduce your sodium intake to 1,500 mg a day.  When on the DASH eating plan, aim to eat more fresh fruits and vegetables, whole grains, lean proteins, low-fat dairy, and heart-healthy fats.  Work with your health care provider or  diet and nutrition specialist (dietitian) to adjust your eating plan to your individual calorie needs. This information is not intended to replace advice given to you by your health care provider. Make sure you discuss any questions you have with your health care provider. Document Released: 03/01/2011 Document Revised: 03/05/2016 Document Reviewed: 03/05/2016 Elsevier Interactive Patient Education  [employer].

## 2018-02-18 NOTE — Assessment & Plan Note (Signed)
Encouraged heart healthy diet, increase exercise, avoid trans fats, consider a krill oil cap daily 

## 2018-02-18 NOTE — Assessment & Plan Note (Signed)
Pap today, no concerns on exam.  

## 2018-02-19 LAB — CBC
HCT: 35.2 % — ABNORMAL LOW (ref 36.0–46.0)
HEMOGLOBIN: 11.4 g/dL — AB (ref 12.0–15.0)
MCHC: 32.2 g/dL (ref 30.0–36.0)
MCV: 81.5 fl (ref 78.0–100.0)
PLATELETS: 274 10*3/uL (ref 150.0–400.0)
RBC: 4.32 Mil/uL (ref 3.87–5.11)
RDW: 15.2 % (ref 11.5–15.5)
WBC: 6.4 10*3/uL (ref 4.0–10.5)

## 2018-02-19 LAB — LIPID PANEL
CHOLESTEROL: 179 mg/dL (ref 0–200)
HDL: 54.4 mg/dL (ref >39.00)
NonHDL: 125.06
Total CHOL/HDL Ratio: 3
Triglycerides: 208 mg/dL — ABNORMAL HIGH (ref 0.0–149.0)
VLDL: 41.6 mg/dL — AB (ref 0.0–40.0)

## 2018-02-19 LAB — COMPREHENSIVE METABOLIC PANEL
ALBUMIN: 4.1 g/dL (ref 3.5–5.2)
ALK PHOS: 52 U/L (ref 39–117)
ALT: 23 U/L (ref 0–35)
AST: 26 U/L (ref 0–37)
BILIRUBIN TOTAL: 0.3 mg/dL (ref 0.2–1.2)
BUN: 13 mg/dL (ref 6–23)
CALCIUM: 9.5 mg/dL (ref 8.4–10.5)
CO2: 28 mEq/L (ref 19–32)
Chloride: 103 mEq/L (ref 96–112)
Creatinine, Ser: 0.74 mg/dL (ref 0.40–1.20)
GFR: 83.1 mL/min (ref >60.00)
GLUCOSE: 89 mg/dL (ref 70–99)
Potassium: 4.1 mEq/L (ref 3.5–5.1)
Sodium: 137 mEq/L (ref 135–145)
TOTAL PROTEIN: 6.8 g/dL (ref 6.0–8.3)

## 2018-02-19 LAB — LDL CHOLESTEROL, DIRECT: LDL DIRECT: 106 mg/dL

## 2018-02-19 LAB — VITAMIN B12: Vitamin B-12: 478 pg/mL (ref 211–911)

## 2018-02-19 LAB — VITAMIN D 25 HYDROXY (VIT D DEFICIENCY, FRACTURES): VITD: 33.99 ng/mL (ref 30.00–100.00)

## 2018-02-19 LAB — HEMOGLOBIN A1C: Hgb A1c MFr Bld: 6 % (ref 4.6–6.5)

## 2018-02-19 LAB — TSH: TSH: 0.82 u[IU]/mL (ref 0.35–4.50)

## 2018-02-24 LAB — CYTOLOGY - PAP
Diagnosis: NEGATIVE
HPV (WINDOPATH): NOT DETECTED

## 2018-02-24 NOTE — Assessment & Plan Note (Signed)
Encouraged to get adequate exercise, calcium and vitamin d intake 

## 2018-02-24 NOTE — Progress Notes (Signed)
Subjective:    Patient ID: Carrie Elliott, female    DOB: 1950-09-11, 67 y.o.   MRN: [id number]  No chief complaint on file.   HPI Patient is in today for follow up and pap smear. She is accompanied by her husband. She continues to struggle with some dizziness but it has improved. She can still feel wobbly when she moves quickly especially. She also notes some swelling and discomfort in left ankle but no recent injury. She denies any gyn complaints today. No discharge or pain. Denies CP/palp/SOB/HA/congestion/fevers/GI or GU c/o. Taking meds as prescribed  Past Medical History:  Diagnosis Date  . Allergic rhinitis   . Allergy   . Anemia 08/24/2014  . Arthritis    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, shoulders, knees  . Benign paroxysmal positional vertigo 07/12/2013  . Chicken pox   . Cystocele    grade 2  . Endometriosis    with Menometrorhaghia   . Gastric polyps 09/30/2015  . Gastroesophageal reflux disease with hiatal hernia    Cough Sees Dr Laretta Bolster   . GERD (gastroesophageal reflux disease)   . Hemorrhoids   . History of colonic polyps   . History of colonic polyps 09/30/2015   adenoma  . HTN (hypertension) 04/23/2013  . Hypocalcemia 09/30/2015  . Hyponatremia 11/23/2016  . IBS (irritable bowel syndrome)   . Measles   . Mumps   . Muscle spasm 09/30/2015  . Osteopenia 10/09/2015  . Rectocele    grade 2  . Reflux    cough  . Rosacea   . Sternal fracture    1986  . Sun-damaged skin 09/05/2014  . Vertigo 11/23/2016  . Vitamin D deficiency 04/06/2016  . 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 APPENDECTOMY  1989  . atypical nevus excision    . BREAST BIOPSY Right   . CHOLECYSTECTOMY  1989  . COLONOSCOPY  2011  . COLONOSCOPY WITH PROPOFOL N/A 05/27/2015   Procedure: COLONOSCOPY WITH PROPOFOL;  Surgeon: Clarene Essex, MD;  Location: [facility];  Service: Endoscopy;  Laterality: N/A;  . CYSTOSCOPY W/ URETERAL STENT REMOVAL  1999  .  ESOPHAGOGASTRODUODENOSCOPY  2011   with esophageal stent, perforation   . ESOPHAGOGASTRODUODENOSCOPY (EGD) WITH PROPOFOL N/A 05/27/2015   Procedure: ESOPHAGOGASTRODUODENOSCOPY (EGD) WITH PROPOFOL;  Surgeon: Clarene Essex, MD;  Location: [facility];  Service: Endoscopy;  Laterality: N/A;  . HEMORRHOID SURGERY  2005  . LEFT OOPHORECTOMY  6237   complicated by ureteral injuery requiring a ureteral stent   . MMK / ANTERIOR VESICOURETHROPEXY / URETHROPEXY  2000  . TONSILLECTOMY AND ADENOIDECTOMY    . TOTAL ABDOMINAL HYSTERECTOMY  2000  . TUBAL LIGATION  1981  . wisdom teeth removal  1964    Family History  Problem Relation Age of Onset  . Colon polyps Father   . Hypertension Father   . Heart failure Father   . Melanoma Father        multiple  . Heart attack Father        x2  . Heart disease Father        Bundle Block  . Meniere's disease Father   . Cancer Father        melanoma  . Osteoporosis Mother   . Transient ischemic attack Mother   . Cancer Mother        colon  . Heart disease Mother        afib  . Hyperlipidemia Mother   .  Diabetes Paternal Grandmother   . Stroke Maternal Grandmother   . Cancer Maternal Grandmother        skin, melanoma, sarcoma  . Colon polyps Maternal Grandmother   . Diverticulitis Maternal Grandmother   . Stroke Maternal Grandfather   . Heart disease Maternal Grandfather   . Obesity Brother     Social History   Socioeconomic History  . Marital status: Married    Spouse name: Not on file  . Number of children: Not on file  . Years of education: Not on file  . Highest education level: Not on file  Occupational History  . Not on file  Social Needs  . Financial resource strain: Not on file  . Food insecurity:    Worry: Not on file    Inability: Not on file  . Transportation needs:    Medical: Not on file    Non-medical: Not on file  Tobacco Use  . Smoking status: Never Smoker  . Smokeless tobacco: Never Used  Substance and Sexual  Activity  . Alcohol use: No  . Drug use: No  . 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 Physical activity:    Days per week: Not on file    Minutes per session: Not on file  . Stress: Not on file  Relationships  . Social connections:    Talks on phone: Not on file    Gets together: Not on file    Attends religious service: Not on file    Active member of club or organization: Not on file    Attends meetings of clubs or organizations: Not on file    Relationship status: Not on file  . Intimate partner violence:    Fear of current or ex partner: Not on file    Emotionally abused: Not on file    Physically abused: Not on file    Forced sexual activity: Not on file  Other Topics Concern  . Not on file  Social History Narrative  . Not on file    Outpatient Medications Prior to Visit  Medication Sig Dispense Refill  . acetaminophen (TYLENOL) 500 MG tablet Take 1,000 mg by mouth every 6 (six) hours as needed for headache (pain).    . benzocaine (ORAJEL) 10 % mucosal gel Use as directed 1 application in the mouth or throat as needed for mouth pain. Reported on 07/22/2015    . esomeprazole (NEXIUM) 40 MG capsule   3  . famotidine (PEPCID) 20 MG tablet     . fexofenadine (ALLEGRA) 180 MG tablet Take 180 mg by mouth as needed.     . Lidocaine-Hydrocortisone Ace 3-0.5 % CREA Apply 1 application topically daily as needed (hemmoriods.).     Marland Kitchen meclizine (ANTIVERT) 25 MG tablet Take 1 tablet (25 mg total) by mouth 3 (three) times daily as needed for dizziness. 30 tablet 2  . metoprolol succinate (TOPROL-XL) 50 MG 24 hr tablet Take 1 tablet (50 mg total) by mouth daily. Take with or immediately following a meal. 90 tablet 3  . mometasone (NASONEX) 50 MCG/ACT nasal spray Place 1-2 sprays into the nose daily as needed. Reported on 07/22/2015 17 g 5  . ondansetron (ZOFRAN) 8 MG tablet Take 1 tablet (8 mg total) by mouth every  8 (eight) hours as needed for nausea or vomiting. 90 tablet 1  . SIMPLY SALINE NA Place 1 each into the nose daily.     Marland Kitchen  esomeprazole (NEXIUM) 20 MG capsule Take 1 capsule (20 mg total) by mouth daily at 12 noon. 90 capsule 3  . ranitidine (ZANTAC) 150 MG tablet Take 150 mg by mouth 4 (four) times daily.     No facility-administered medications prior to visit.     Allergies  Allergen Reactions  . Cefdinir     HIVES  . Celebrex [Celecoxib]     ABDOMINAL PAIN   . Ciprofloxacin     ABDOMINAL PAIN   . Codeine     NAUSEA VOMITING - can take with zofran  . Pseudoephedrine Hcl Er     TACHYCARDIA   . Singulair [Montelukast Sodium] Other (See Comments)    Mental status changes, irritable    Review of Systems  Constitutional: Positive for malaise/fatigue. Negative for fever.  HENT: Negative for congestion.   Eyes: Negative for blurred vision.  Respiratory: Negative for shortness of breath.   Cardiovascular: Negative for chest pain, palpitations and leg swelling.  Gastrointestinal: Negative for abdominal pain, blood in stool and nausea.  Genitourinary: Negative for dysuria and frequency.  Musculoskeletal: Positive for joint pain. Negative for falls.  Skin: Negative for rash.  Neurological: Positive for dizziness. Negative for loss of consciousness and headaches.  Endo/Heme/Allergies: Negative for environmental allergies.  Psychiatric/Behavioral: Negative for depression. The patient is not nervous/anxious.        Objective:    Physical Exam  Constitutional: She is oriented to person, place, and time. No distress.  HENT:  Head: Normocephalic and atraumatic.  Right Ear: External ear normal.  Left Ear: External ear normal.  Nose: Nose normal.  Mouth/Throat: Oropharynx is clear and moist. No oropharyngeal exudate.  Eyes: Pupils are equal, round, and reactive to light. Conjunctivae are normal. Right eye exhibits no discharge. Left eye exhibits no discharge. No scleral icterus.    Neck: Normal range of motion. Neck supple. No thyromegaly present.  Cardiovascular: Normal rate, regular rhythm, normal heart sounds and intact distal pulses.  No murmur heard. Pulmonary/Chest: Effort normal and breath sounds normal. No respiratory distress. She has no wheezes. She has no rales.  Abdominal: Soft. Bowel sounds are normal. She exhibits no distension and no mass. There is no tenderness.  Genitourinary: Vagina normal. No vaginal discharge found.  Musculoskeletal: Normal range of motion. She exhibits no edema or tenderness.  Lymphadenopathy:    She has no cervical adenopathy.  Neurological: She is alert and oriented to person, place, and time. She has normal reflexes. She displays normal reflexes. No cranial nerve deficit. Coordination normal.  Skin: Skin is warm and dry. No rash noted. She is not diaphoretic.    BP 108/68 (BP Location: Left Arm, Patient Position: Sitting, Cuff Size: Normal)   Pulse 75   Temp 97.7 F (36.5 C) (Oral)   Resp 18   Ht 5' 0.5" (1.537 m)   Wt 154 lb 9.6 oz (70.1 kg)   SpO2 98%   BMI 29.70 kg/m  Wt Readings from Last 3 Encounters:  02/18/18 154 lb 9.6 oz (70.1 kg)  11/15/17 163 lb (73.9 kg)  09/13/17 167 lb (75.8 kg)     Lab Results  Component Value Date   WBC 6.4 02/18/2018   HGB 11.4 (L) 02/18/2018   HCT 35.2 (L) 02/18/2018   PLT 274.0 02/18/2018   GLUCOSE 89 02/18/2018   CHOL 179 02/18/2018   TRIG 208.0 (H) 02/18/2018   HDL 54.40 02/18/2018   LDLDIRECT 106.0 02/18/2018   LDLCALC 92 09/13/2017   ALT 23 02/18/2018  AST 26 02/18/2018   NA 137 02/18/2018   K 4.1 02/18/2018   CL 103 02/18/2018   CREATININE 0.74 02/18/2018   BUN 13 02/18/2018   CO2 28 02/18/2018   TSH 0.82 02/18/2018   INR 1.19 02/07/2010   HGBA1C 6.0 02/18/2018    Lab Results  Component Value Date   TSH 0.82 02/18/2018   Lab Results  Component Value Date   WBC 6.4 02/18/2018   HGB 11.4 (L) 02/18/2018   HCT 35.2 (L) 02/18/2018   MCV 81.5 02/18/2018    PLT 274.0 02/18/2018   Lab Results  Component Value Date   NA 137 02/18/2018   K 4.1 02/18/2018   CO2 28 02/18/2018   GLUCOSE 89 02/18/2018   BUN 13 02/18/2018   CREATININE 0.74 02/18/2018   BILITOT 0.3 02/18/2018   ALKPHOS 52 02/18/2018   AST 26 02/18/2018   ALT 23 02/18/2018   PROT 6.8 02/18/2018   ALBUMIN 4.1 02/18/2018   CALCIUM 9.5 02/18/2018   ANIONGAP 8 05/31/2016   GFR 83.10 02/18/2018   Lab Results  Component Value Date   CHOL 179 02/18/2018   Lab Results  Component Value Date   HDL 54.40 02/18/2018   Lab Results  Component Value Date   LDLCALC 92 09/13/2017   Lab Results  Component Value Date   TRIG 208.0 (H) 02/18/2018   Lab Results  Component Value Date   CHOLHDL 3 02/18/2018   Lab Results  Component Value Date   HGBA1C 6.0 02/18/2018       Assessment & Plan:   Problem List Items Addressed This Visit    HTN (hypertension) (Chronic)    Well controlled, no changes to meds. Encouraged heart healthy diet such as the DASH diet and exercise as tolerated.       Relevant Orders   CBC (Completed)   Comprehensive metabolic panel (Completed)   TSH (Completed)   Cervical cancer screening - Primary    Pap today, no concerns on exam.       Relevant Orders   Cytology - PAP( [place])   Osteopenia    Encouraged to get adequate exercise, calcium and vitamin d intake      Vitamin D deficiency    Supplement and monitor      Relevant Orders   VITAMIN D 25 Hydroxy (Vit-D Deficiency, Fractures) (Completed)   Hyperlipidemia    Encouraged heart healthy diet, increase exercise, avoid trans fats, consider a krill oil cap daily      Relevant Orders   Lipid panel (Completed)   Hyperglycemia   Relevant Orders   Hemoglobin A1c (Completed)   Vitamin B12 deficiency    Supplement and monitor      Relevant Orders   Vitamin B12 (Completed)      I have discontinued Christean Grief. Dymek's ranitidine. I am also having her maintain her fexofenadine,  acetaminophen, benzocaine, Lidocaine-Hydrocortisone Ace, SIMPLY SALINE NA, meclizine, ondansetron, metoprolol succinate, mometasone, famotidine, and esomeprazole.  No orders of the defined types were placed in this encounter.    Penni Homans, MD

## 2018-02-28 ENCOUNTER — Ambulatory Visit: Payer: 59 | Admitting: Family Medicine

## 2018-03-06 MED FILL — ESOMEPRAZOLE MAG DR 40 MG C: 40 | 90 days supply | Qty: 90 | Fill #1

## 2018-03-06 MED FILL — LIDOCAINE-HC 3-0.5% CREAM: 3-0.5 | 90 days supply | Qty: 294 | Fill #1

## 2018-03-06 MED FILL — FAMOTIDINE 20 MG TABLET: 20 | 90 days supply | Qty: 180 | Fill #1

## 2018-03-06 MED FILL — MOMETASONE FUROATE 50 MCG S: 50 | 90 days supply | Qty: 51 | Fill #2

## 2018-03-06 MED FILL — METOPROLOL SUCCINATE ER 50: 50 | 90 days supply | Qty: 90 | Fill #2

## 2018-03-21 MED FILL — ESOMEPRAZOLE MAGNESIUM 20 M: 20 | 90 days supply | Qty: 90 | Fill #0

## 2018-05-23 ENCOUNTER — Encounter: Payer: Self-pay | Admitting: Family Medicine

## 2018-05-23 ENCOUNTER — Ambulatory Visit (INDEPENDENT_AMBULATORY_CARE_PROVIDER_SITE_OTHER): Payer: Medicare Other | Admitting: Family Medicine

## 2018-05-23 VITALS — BP 138/84 | HR 72 | Temp 97.8°F | Resp 18 | Wt 161.8 lb

## 2018-05-23 DIAGNOSIS — R1031 Right lower quadrant pain: Secondary | ICD-10-CM | POA: Diagnosis not present

## 2018-05-23 DIAGNOSIS — I10 Essential (primary) hypertension: Secondary | ICD-10-CM

## 2018-05-23 DIAGNOSIS — I771 Stricture of artery: Secondary | ICD-10-CM

## 2018-05-23 DIAGNOSIS — R110 Nausea: Secondary | ICD-10-CM

## 2018-05-23 DIAGNOSIS — E538 Deficiency of other specified B group vitamins: Secondary | ICD-10-CM

## 2018-05-23 DIAGNOSIS — R739 Hyperglycemia, unspecified: Secondary | ICD-10-CM | POA: Diagnosis not present

## 2018-05-23 DIAGNOSIS — E559 Vitamin D deficiency, unspecified: Secondary | ICD-10-CM | POA: Diagnosis not present

## 2018-05-23 LAB — URINALYSIS, ROUTINE W REFLEX MICROSCOPIC
Bilirubin Urine: NEGATIVE
KETONES UR: NEGATIVE
Leukocytes,Ua: NEGATIVE
Nitrite: NEGATIVE
PH: 6 (ref 5.0–8.0)
Specific Gravity, Urine: 1.02 (ref 1.000–1.030)
Total Protein, Urine: NEGATIVE
UROBILINOGEN UA: 0.2 (ref 0.0–1.0)
Urine Glucose: NEGATIVE

## 2018-05-23 LAB — COMPREHENSIVE METABOLIC PANEL
ALK PHOS: 61 U/L (ref 39–117)
ALT: 16 U/L (ref 0–35)
AST: 15 U/L (ref 0–37)
Albumin: 4.2 g/dL (ref 3.5–5.2)
BILIRUBIN TOTAL: 0.5 mg/dL (ref 0.2–1.2)
BUN: 14 mg/dL (ref 6–23)
CO2: 28 meq/L (ref 19–32)
CREATININE: 0.7 mg/dL (ref 0.40–1.20)
Calcium: 9.4 mg/dL (ref 8.4–10.5)
Chloride: 105 mEq/L (ref 96–112)
GFR: 83.3 mL/min (ref >60.00)
Glucose, Bld: 83 mg/dL (ref 70–99)
Potassium: 4.1 mEq/L (ref 3.5–5.1)
Sodium: 140 mEq/L (ref 135–145)
TOTAL PROTEIN: 7.2 g/dL (ref 6.0–8.3)

## 2018-05-23 LAB — CBC WITH DIFFERENTIAL/PLATELET
BASOS ABS: 0.1 10*3/uL (ref 0.0–0.1)
Basophils Relative: 1 % (ref 0.0–3.0)
EOS ABS: 0.1 10*3/uL (ref 0.0–0.7)
Eosinophils Relative: 1.9 % (ref 0.0–5.0)
HCT: 36 % (ref 36.0–46.0)
Hemoglobin: 11.6 g/dL — ABNORMAL LOW (ref 12.0–15.0)
LYMPHS ABS: 1.6 10*3/uL (ref 0.7–4.0)
LYMPHS PCT: 31.6 % (ref 12.0–46.0)
MCHC: 32.3 g/dL (ref 30.0–36.0)
MCV: 81.8 fl (ref 78.0–100.0)
Monocytes Absolute: 0.4 10*3/uL (ref 0.1–1.0)
Monocytes Relative: 8.4 % (ref 3.0–12.0)
NEUTROS PCT: 57.1 % (ref 43.0–77.0)
Neutro Abs: 2.9 10*3/uL (ref 1.4–7.7)
Platelets: 262 10*3/uL (ref 150.0–400.0)
RBC: 4.4 Mil/uL (ref 3.87–5.11)
RDW: 15.1 % (ref 11.5–15.5)
WBC: 5.1 10*3/uL (ref 4.0–10.5)

## 2018-05-23 LAB — SEDIMENTATION RATE: Sed Rate: 10 mm/hr (ref 0–30)

## 2018-05-23 MED FILL — METOPROLOL SUCCINATE ER 50: 50 | 90 days supply | Qty: 90 | Fill #3

## 2018-05-23 NOTE — Assessment & Plan Note (Signed)
Has noted just over a week's worth of intermittent right lower pain without any specific pattern but noted to cause her to double over in pain at times and to experience some nausea and diaphoresis. She denies any change in bowel or bladder habits. Will proceed with CT scan to rule out diverticulitis.

## 2018-05-23 NOTE — Progress Notes (Signed)
Subjective:    Patient ID: Carrie Elliott, female    DOB: December 20, 1950, 68 y.o.   MRN: [id number]  No chief complaint on file.   HPI Patient is in today for follow up. She is accompanied by her husband and they endorse severe episodes of RLQ abdominal pain episodes occurring every day for over a month. They can double her over in pain and cause some diaphroresis and nausea. They can last minutes to hours. No change in bowel habits or urination. Denies CP/palp/SOB/HA/congestion/fevers/GI or GU c/o. Taking meds as prescribed  Past Medical History:  Diagnosis Date  . Allergic rhinitis   . Allergy   . Anemia 08/24/2014  . Arthritis    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, shoulders, knees  . Benign paroxysmal positional vertigo 07/12/2013  . Chicken pox   . Cystocele    grade 2  . Endometriosis    with Menometrorhaghia   . Gastric polyps 09/30/2015  . Gastroesophageal reflux disease with hiatal hernia    Cough Sees Dr Laretta Bolster   . GERD (gastroesophageal reflux disease)   . Hemorrhoids   . History of colonic polyps   . History of colonic polyps 09/30/2015   adenoma  . HTN (hypertension) 04/23/2013  . Hypocalcemia 09/30/2015  . Hyponatremia 11/23/2016  . IBS (irritable bowel syndrome)   . Measles   . Mumps   . Muscle spasm 09/30/2015  . Osteopenia 10/09/2015  . Rectocele    grade 2  . Reflux    cough  . Rosacea   . Sternal fracture    1986  . Sun-damaged skin 09/05/2014  . Vertigo 11/23/2016  . Vitamin D deficiency 04/06/2016  . 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 APPENDECTOMY  1989  . atypical nevus excision    . BREAST BIOPSY Right   . CHOLECYSTECTOMY  1989  . COLONOSCOPY  2011  . COLONOSCOPY WITH PROPOFOL N/A 05/27/2015   Procedure: COLONOSCOPY WITH PROPOFOL;  Surgeon: Clarene Essex, MD;  Location: [facility];  Service: Endoscopy;  Laterality: N/A;  . CYSTOSCOPY W/ URETERAL STENT REMOVAL  1999  . ESOPHAGOGASTRODUODENOSCOPY  2011   with esophageal stent,  perforation   . ESOPHAGOGASTRODUODENOSCOPY (EGD) WITH PROPOFOL N/A 05/27/2015   Procedure: ESOPHAGOGASTRODUODENOSCOPY (EGD) WITH PROPOFOL;  Surgeon: Clarene Essex, MD;  Location: [facility];  Service: Endoscopy;  Laterality: N/A;  . HEMORRHOID SURGERY  2005  . LEFT OOPHORECTOMY  4098   complicated by ureteral injuery requiring a ureteral stent   . MMK / ANTERIOR VESICOURETHROPEXY / URETHROPEXY  2000  . TONSILLECTOMY AND ADENOIDECTOMY    . TOTAL ABDOMINAL HYSTERECTOMY  2000  . TUBAL LIGATION  1981  . wisdom teeth removal  1964    Family History  Problem Relation Age of Onset  . Colon polyps Father   . Hypertension Father   . Heart failure Father   . Melanoma Father        multiple  . Heart attack Father        x2  . Heart disease Father        Bundle Block  . Meniere's disease Father   . Cancer Father        melanoma  . Osteoporosis Mother   . Transient ischemic attack Mother   . Cancer Mother        colon  . Heart disease Mother        afib  . Hyperlipidemia Mother   . Diabetes Paternal Grandmother   .  Stroke Maternal Grandmother   . Cancer Maternal Grandmother        skin, melanoma, sarcoma  . Colon polyps Maternal Grandmother   . Diverticulitis Maternal Grandmother   . Stroke Maternal Grandfather   . Heart disease Maternal Grandfather   . Obesity Brother     Social History   Socioeconomic History  . Marital status: Married    Spouse name: Not on file  . Number of children: Not on file  . Years of education: Not on file  . Highest education level: Not on file  Occupational History  . Not on file  Social Needs  . Financial resource strain: Not on file  . Food insecurity:    Worry: Not on file    Inability: Not on file  . Transportation needs:    Medical: Not on file    Non-medical: Not on file  Tobacco Use  . Smoking status: Never Smoker  . Smokeless tobacco: Never Used  Substance and Sexual Activity  . Alcohol use: No  . Drug use: No  . 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 Physical activity:    Days per week: Not on file    Minutes per session: Not on file  . Stress: Not on file  Relationships  . Social connections:    Talks on phone: Not on file    Gets together: Not on file    Attends religious service: Not on file    Active member of club or organization: Not on file    Attends meetings of clubs or organizations: Not on file    Relationship status: Not on file  . Intimate partner violence:    Fear of current or ex partner: Not on file    Emotionally abused: Not on file    Physically abused: Not on file    Forced sexual activity: Not on file  Other Topics Concern  . Not on file  Social History Narrative  . Not on file    Outpatient Medications Prior to Visit  Medication Sig Dispense Refill  . acetaminophen (TYLENOL) 500 MG tablet Take 1,000 mg by mouth every 6 (six) hours as needed for headache (pain).    . benzocaine (ORAJEL) 10 % mucosal gel Use as directed 1 application in the mouth or throat as needed for mouth pain. Reported on 07/22/2015    . esomeprazole (NEXIUM) 40 MG capsule   3  . famotidine (PEPCID) 20 MG tablet     . fexofenadine (ALLEGRA) 180 MG tablet Take 180 mg by mouth as needed.     . Lidocaine-Hydrocortisone Ace 3-0.5 % CREA Apply 1 application topically daily as needed (hemmoriods.).     Marland Kitchen meclizine (ANTIVERT) 25 MG tablet Take 1 tablet (25 mg total) by mouth 3 (three) times daily as needed for dizziness. 30 tablet 2  . metoprolol succinate (TOPROL-XL) 50 MG 24 hr tablet Take 1 tablet (50 mg total) by mouth daily. Take with or immediately following a meal. 90 tablet 3  . mometasone (NASONEX) 50 MCG/ACT nasal spray Place 1-2 sprays into the nose daily as needed. Reported on 07/22/2015 17 g 5  . ondansetron (ZOFRAN) 8 MG tablet Take 1 tablet (8 mg total) by mouth every 8 (eight) hours as needed for nausea or vomiting. 90  tablet 1  . SIMPLY SALINE NA Place 1 each into the nose daily.      No facility-administered  medications prior to visit.     Allergies  Allergen Reactions  . Cefdinir     HIVES  . Celebrex [Celecoxib]     ABDOMINAL PAIN   . Ciprofloxacin     ABDOMINAL PAIN   . Codeine     NAUSEA VOMITING - can take with zofran  . Pseudoephedrine Hcl Er     TACHYCARDIA   . Singulair [Montelukast Sodium] Other (See Comments)    Mental status changes, irritable    Review of Systems  Constitutional: Negative for fever and malaise/fatigue.  HENT: Negative for congestion.   Eyes: Negative for blurred vision.  Respiratory: Negative for shortness of breath.   Cardiovascular: Negative for chest pain, palpitations and leg swelling.  Gastrointestinal: Negative for abdominal pain, blood in stool and nausea.  Genitourinary: Negative for dysuria and frequency.  Musculoskeletal: Negative for falls.  Skin: Negative for rash.  Neurological: Negative for dizziness, loss of consciousness and headaches.  Endo/Heme/Allergies: Negative for environmental allergies.  Psychiatric/Behavioral: Negative for depression. The patient is not nervous/anxious.        Objective:    Physical Exam  BP 138/84   Pulse 72   Temp 97.8 F (36.6 C) (Oral)   Resp 18   Wt 161 lb 12.8 oz (73.4 kg)   SpO2 97%   BMI 31.08 kg/m  Wt Readings from Last 3 Encounters:  05/23/18 161 lb 12.8 oz (73.4 kg)  02/18/18 154 lb 9.6 oz (70.1 kg)  11/15/17 163 lb (73.9 kg)     Lab Results  Component Value Date   WBC 5.1 05/23/2018   HGB 11.6 (L) 05/23/2018   HCT 36.0 05/23/2018   PLT 262.0 05/23/2018   GLUCOSE 83 05/23/2018   CHOL 179 02/18/2018   TRIG 208.0 (H) 02/18/2018   HDL 54.40 02/18/2018   LDLDIRECT 106.0 02/18/2018   LDLCALC 92 09/13/2017   ALT 16 05/23/2018   AST 15 05/23/2018   NA 140 05/23/2018   K 4.1 05/23/2018   CL 105 05/23/2018   CREATININE 0.70 05/23/2018   BUN 14 05/23/2018   CO2 28 05/23/2018   TSH  0.82 02/18/2018   INR 1.19 02/07/2010   HGBA1C 6.0 02/18/2018    Lab Results  Component Value Date   TSH 0.82 02/18/2018   Lab Results  Component Value Date   WBC 5.1 05/23/2018   HGB 11.6 (L) 05/23/2018   HCT 36.0 05/23/2018   MCV 81.8 05/23/2018   PLT 262.0 05/23/2018   Lab Results  Component Value Date   NA 140 05/23/2018   K 4.1 05/23/2018   CO2 28 05/23/2018   GLUCOSE 83 05/23/2018   BUN 14 05/23/2018   CREATININE 0.70 05/23/2018   BILITOT 0.5 05/23/2018   ALKPHOS 61 05/23/2018   AST 15 05/23/2018   ALT 16 05/23/2018   PROT 7.2 05/23/2018   ALBUMIN 4.2 05/23/2018   CALCIUM 9.4 05/23/2018   ANIONGAP 8 05/31/2016   GFR 83.30 05/23/2018   Lab Results  Component Value Date   CHOL 179 02/18/2018   Lab Results  Component Value Date   HDL 54.40 02/18/2018   Lab Results  Component Value Date   LDLCALC 92 09/13/2017   Lab Results  Component Value Date   TRIG 208.0 (H) 02/18/2018   Lab Results  Component Value Date   CHOLHDL 3 02/18/2018   Lab Results  Component Value Date   HGBA1C 6.0 02/18/2018       Assessment & Plan:   Problem List Items Addressed  This Visit    HTN (hypertension) (Chronic)    Well controlled, no changes to meds. Encouraged heart healthy diet such as the DASH diet and exercise as tolerated.       Vitamin D deficiency    Supplement and monitor      Hyperglycemia    hgba1c acceptable, minimize simple carbs. Increase exercise as tolerated.       Vitamin B12 deficiency    Supplement and monitor      Right lower quadrant abdominal pain    Has noted just over a week's worth of intermittent right lower pain without any specific pattern but noted to cause her to double over in pain at times and to experience some nausea and diaphoresis. She denies any change in bowel or bladder habits. Will proceed with CT scan to rule out diverticulitis.       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 Kendra maintain her fexofenadine, acetaminophen, benzocaine, Lidocaine-Hydrocortisone Ace, SIMPLY SALINE NA, meclizine, ondansetron, metoprolol succinate, mometasone, famotidine, and esomeprazole.  No orders of the defined types were placed in this encounter.    Penni Homans, MD

## 2018-05-23 NOTE — Assessment & Plan Note (Signed)
Supplement and monitor 

## 2018-05-23 NOTE — Assessment & Plan Note (Signed)
Well controlled, no changes to meds. Encouraged heart healthy diet such as the DASH diet and exercise as tolerated.  °

## 2018-05-23 NOTE — Patient Instructions (Signed)
Abdominal Pain, Adult  Abdominal pain can be caused by many things. Often, abdominal pain is not serious and it gets better with no treatment or by being treated at home. However, sometimes abdominal pain is serious. Your health care provider will do a medical history and a physical exam to try to determine the cause of your abdominal pain.  Follow these instructions at home:   Take over-the-counter and prescription medicines only as told by your health care provider. Do not take a laxative unless told by your health care provider.   Drink enough fluid to keep your urine clear or pale yellow.   Watch your condition for any changes.   Keep all follow-up visits as told by your health care provider. This is important.  Contact a health care provider if:   Your abdominal pain changes or gets worse.   You are not hungry or you lose weight without trying.   You are constipated or have diarrhea for more than 2-3 days.   You have pain when you urinate or have a bowel movement.   Your abdominal pain wakes you up at night.   Your pain gets worse with meals, after eating, or with certain foods.   You are throwing up and cannot keep anything down.   You have a fever.  Get help right away if:   Your pain does not go away as soon as your health care provider told you to expect.   You cannot stop throwing up.   Your pain is only in areas of the abdomen, such as the right side or the left lower portion of the abdomen.   You have bloody or black stools, or stools that look like tar.   You have severe pain, cramping, or bloating in your abdomen.   You have signs of dehydration, such as:  ? Dark urine, very little urine, or no urine.  ? Cracked lips.  ? Dry mouth.  ? Sunken eyes.  ? Sleepiness.  ? Weakness.  This information is not intended to replace advice given to you by your health care provider. Make sure you discuss any questions you have with your health care provider.  Document Released: 12/20/2004 Document  Revised: 09/30/2015 Document Reviewed: 08/24/2015  Elsevier Interactive Patient Education  2019 [employer]

## 2018-05-23 NOTE — Assessment & Plan Note (Signed)
hgba1c acceptable, minimize simple carbs. Increase exercise as tolerated.  

## 2018-05-24 LAB — URINE CULTURE
MICRO NUMBER:: 256577
RESULT: NO GROWTH
SPECIMEN QUALITY: ADEQUATE

## 2018-05-30 ENCOUNTER — Encounter: Payer: Self-pay | Admitting: Family Medicine

## 2018-05-31 ENCOUNTER — Ambulatory Visit (HOSPITAL_BASED_OUTPATIENT_CLINIC_OR_DEPARTMENT_OTHER)
Admission: RE | Admit: 2018-05-31 | Discharge: 2018-05-31 | Disposition: A | Discharge status: Home / Self Care | Payer: Medicare Other | Source: Ambulatory Visit | Attending: Family Medicine | Admitting: Family Medicine

## 2018-05-31 ENCOUNTER — Encounter (HOSPITAL_BASED_OUTPATIENT_CLINIC_OR_DEPARTMENT_OTHER): Payer: Self-pay

## 2018-05-31 DIAGNOSIS — I771 Stricture of artery: Secondary | ICD-10-CM | POA: Diagnosis not present

## 2018-05-31 DIAGNOSIS — I6503 Occlusion and stenosis of bilateral vertebral arteries: Secondary | ICD-10-CM | POA: Diagnosis not present

## 2018-05-31 MED ORDER — IOPAMIDOL (ISOVUE-370) INJECTION 76%
100.0000 mL | Freq: Once | INTRAVENOUS | Status: AC | PRN
  Administered 2018-05-31: 100 mL via INTRAVENOUS

## 2018-06-02 ENCOUNTER — Other Ambulatory Visit (HOSPITAL_BASED_OUTPATIENT_CLINIC_OR_DEPARTMENT_OTHER): Payer: PRIVATE HEALTH INSURANCE

## 2018-06-03 MED FILL — FAMOTIDINE 20 MG TABLET: 20 | 90 days supply | Qty: 180 | Fill #2

## 2018-07-04 ENCOUNTER — Ambulatory Visit: Payer: PRIVATE HEALTH INSURANCE | Admitting: Family Medicine

## 2018-07-07 ENCOUNTER — Ambulatory Visit (INDEPENDENT_AMBULATORY_CARE_PROVIDER_SITE_OTHER): Payer: Medicare Other | Admitting: Family Medicine

## 2018-07-07 ENCOUNTER — Other Ambulatory Visit: Payer: Self-pay

## 2018-07-07 DIAGNOSIS — I10 Essential (primary) hypertension: Secondary | ICD-10-CM | POA: Diagnosis not present

## 2018-07-07 DIAGNOSIS — R42 Dizziness and giddiness: Secondary | ICD-10-CM | POA: Diagnosis not present

## 2018-07-08 NOTE — Assessment & Plan Note (Signed)
Patient to monitor and report any concerns.

## 2018-07-08 NOTE — Progress Notes (Signed)
Virtual Visit via Video Note  I connected with Carrie Elliott on 07/08/18 at  2:20 PM EDT by a video enabled telemedicine application and verified that I am speaking with the correct person using two identifiers.   I discussed the limitations of evaluation and management by telemedicine and the availability of in person appointments. The patient expressed understanding and agreed to proceed. Magdalene Molly, CMA was able to get patient on the video platform    Subjective:    Patient ID: Carrie Elliott, female    DOB: October 25, 1950, 68 y.o.   MRN: [id number]  No chief complaint on file.   HPI Patient is in today for follow up on her ongoing struggles with vertigo and hypertension. She notes she feels well today. No recent febrile illness or hospitalizations. She had a recent CTA which showed 1. Narrowing of the vertebral arteries bilaterally at C2. This does not appear to be traumatic or to be affected by patient motion. 2. Fetal type posterior cerebral arteries are fed by the anterior circulation. 3. No significant atherosclerotic disease. 4. Mild degenerative changes in the cervical spine at C5-6 and C6-7. she reports the vertigo is better and mostly occurs with bending head back and quick movement. No new concerns. Denies CP/palp/SOB/HA/congestion/fevers/GI or GU c/o. Taking meds as prescribed  Past Medical History:  Diagnosis Date  . Allergic rhinitis   . Allergy   . Anemia 08/24/2014  . Arthritis    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, shoulders, knees  . Benign paroxysmal positional vertigo 07/12/2013  . Chicken pox   . Cystocele    grade 2  . Endometriosis    with Menometrorhaghia   . Gastric polyps 09/30/2015  . Gastroesophageal reflux disease with hiatal hernia    Cough Sees Dr Laretta Bolster   . GERD (gastroesophageal reflux disease)   . Hemorrhoids   . History of colonic polyps   . History of colonic polyps 09/30/2015   adenoma  . HTN (hypertension) 04/23/2013  . Hypocalcemia 09/30/2015  . Hyponatremia  11/23/2016  . IBS (irritable bowel syndrome)   . Measles   . Mumps   . Muscle spasm 09/30/2015  . Osteopenia 10/09/2015  . Rectocele    grade 2  . Reflux    cough  . Rosacea   . Sternal fracture    1986  . Sun-damaged skin 09/05/2014  . Vertigo 11/23/2016  . Vitamin D deficiency 04/06/2016  . 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 APPENDECTOMY  1989  . atypical nevus excision    . BREAST BIOPSY Right   . CHOLECYSTECTOMY  1989  . COLONOSCOPY  2011  . COLONOSCOPY WITH PROPOFOL N/A 05/27/2015   Procedure: COLONOSCOPY WITH PROPOFOL;  Surgeon: Clarene Essex, MD;  Location: [facility];  Service: Endoscopy;  Laterality: N/A;  . CYSTOSCOPY W/ URETERAL STENT REMOVAL  1999  . ESOPHAGOGASTRODUODENOSCOPY  2011   with esophageal stent, perforation   . ESOPHAGOGASTRODUODENOSCOPY (EGD) WITH PROPOFOL N/A 05/27/2015   Procedure: ESOPHAGOGASTRODUODENOSCOPY (EGD) WITH PROPOFOL;  Surgeon: Clarene Essex, MD;  Location: [facility];  Service: Endoscopy;  Laterality: N/A;  . HEMORRHOID SURGERY  2005  . LEFT OOPHORECTOMY  8185   complicated by ureteral injuery requiring a ureteral stent   . MMK / ANTERIOR VESICOURETHROPEXY / URETHROPEXY  2000  . TONSILLECTOMY AND ADENOIDECTOMY    . TOTAL ABDOMINAL HYSTERECTOMY  2000  . TUBAL LIGATION  1981  . wisdom teeth removal  1964    Family  History  Problem Relation Age of Onset  . Colon polyps Father   . Hypertension Father   . Heart failure Father   . Melanoma Father        multiple  . Heart attack Father        x2  . Heart disease Father        Bundle Block  . Meniere's disease Father   . Cancer Father        melanoma  . Osteoporosis Mother   . Transient ischemic attack Mother   . Cancer Mother        colon  . Heart disease Mother        afib  . Hyperlipidemia Mother   . Diabetes Paternal Grandmother   . Stroke Maternal Grandmother   . Cancer Maternal Grandmother        skin, melanoma, sarcoma  . Colon  polyps Maternal Grandmother   . Diverticulitis Maternal Grandmother   . Stroke Maternal Grandfather   . Heart disease Maternal Grandfather   . Obesity Brother     Social History   Socioeconomic History  . Marital status: Married    Spouse name: Not on file  . Number of children: Not on file  . Years of education: Not on file  . Highest education level: Not on file  Occupational History  . Not on file  Social Needs  . Financial resource strain: Not on file  . Food insecurity:    Worry: Not on file    Inability: Not on file  . Transportation needs:    Medical: Not on file    Non-medical: Not on file  Tobacco Use  . Smoking status: Never Smoker  . Smokeless tobacco: Never Used  Substance and Sexual Activity  . Alcohol use: No  . Drug use: No  . 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 Physical activity:    Days per week: Not on file    Minutes per session: Not on file  . Stress: Not on file  Relationships  . Social connections:    Talks on phone: Not on file    Gets together: Not on file    Attends religious service: Not on file    Active member of club or organization: Not on file    Attends meetings of clubs or organizations: Not on file    Relationship status: Not on file  . Intimate partner violence:    Fear of current or ex partner: Not on file    Emotionally abused: Not on file    Physically abused: Not on file    Forced sexual activity: Not on file  Other Topics Concern  . Not on file  Social History Narrative  . Not on file    Outpatient Medications Prior to Visit  Medication Sig Dispense Refill  . acetaminophen (TYLENOL) 500 MG tablet Take 1,000 mg by mouth every 6 (six) hours as needed for headache (pain).    . benzocaine (ORAJEL) 10 % mucosal gel Use as directed 1 application in the mouth or throat as needed for mouth pain. Reported on 07/22/2015    . esomeprazole (NEXIUM) 40  MG capsule   3  . famotidine (PEPCID) 20 MG tablet     . fexofenadine (ALLEGRA) 180 MG tablet Take 180 mg by mouth as needed.     . Lidocaine-Hydrocortisone Ace 3-0.5 % CREA Apply 1 application topically daily  as needed (hemmoriods.).     Marland Kitchen meclizine (ANTIVERT) 25 MG tablet Take 1 tablet (25 mg total) by mouth 3 (three) times daily as needed for dizziness. 30 tablet 2  . metoprolol succinate (TOPROL-XL) 50 MG 24 hr tablet Take 1 tablet (50 mg total) by mouth daily. Take with or immediately following a meal. 90 tablet 3  . mometasone (NASONEX) 50 MCG/ACT nasal spray Place 1-2 sprays into the nose daily as needed. Reported on 07/22/2015 17 g 5  . ondansetron (ZOFRAN) 8 MG tablet Take 1 tablet (8 mg total) by mouth every 8 (eight) hours as needed for nausea or vomiting. 90 tablet 1  . SIMPLY SALINE NA Place 1 each into the nose daily.      No facility-administered medications prior to visit.     Allergies  Allergen Reactions  . Cefdinir     HIVES  . Celebrex [Celecoxib]     ABDOMINAL PAIN   . Ciprofloxacin     ABDOMINAL PAIN   . Codeine     NAUSEA VOMITING - can take with zofran  . Pseudoephedrine Hcl Er     TACHYCARDIA   . Singulair [Montelukast Sodium] Other (See Comments)    Mental status changes, irritable    Review of Systems  Constitutional: Negative for fever and malaise/fatigue.  HENT: Negative for congestion.   Eyes: Negative for blurred vision.  Respiratory: Negative for shortness of breath.   Cardiovascular: Negative for chest pain, palpitations and leg swelling.  Gastrointestinal: Negative for abdominal pain, blood in stool and nausea.  Genitourinary: Negative for dysuria and frequency.  Musculoskeletal: Negative for falls.  Skin: Negative for rash.  Neurological: Positive for dizziness. Negative for loss of consciousness and headaches.  Endo/Heme/Allergies: Negative for environmental allergies.  Psychiatric/Behavioral: Negative for depression. The patient is not  nervous/anxious.        Objective:    Physical Exam Constitutional:      Appearance: Normal appearance. She is not ill-appearing.  HENT:     Nose: Nose normal.  Pulmonary:     Effort: Pulmonary effort is normal.  Neurological:     Mental Status: She is alert and oriented to person, place, and time.  Psychiatric:        Mood and Affect: Mood normal.        Behavior: Behavior normal.     There were no vitals taken for this visit. Wt Readings from Last 3 Encounters:  05/23/18 161 lb 12.8 oz (73.4 kg)  02/18/18 154 lb 9.6 oz (70.1 kg)  11/15/17 163 lb (73.9 kg)    Diabetic Foot Exam - Simple   No data filed     Lab Results  Component Value Date   WBC 5.1 05/23/2018   HGB 11.6 (L) 05/23/2018   HCT 36.0 05/23/2018   PLT 262.0 05/23/2018   GLUCOSE 83 05/23/2018   CHOL 179 02/18/2018   TRIG 208.0 (H) 02/18/2018   HDL 54.40 02/18/2018   LDLDIRECT 106.0 02/18/2018   LDLCALC 92 09/13/2017   ALT 16 05/23/2018   AST 15 05/23/2018   NA 140 05/23/2018   K 4.1 05/23/2018   CL 105 05/23/2018   CREATININE 0.70 05/23/2018   BUN 14 05/23/2018   CO2 28 05/23/2018   TSH 0.82 02/18/2018   INR 1.19 02/07/2010   HGBA1C 6.0 02/18/2018    Lab Results  Component Value Date   TSH 0.82 02/18/2018   Lab Results  Component Value Date   WBC 5.1 05/23/2018   HGB  11.6 (L) 05/23/2018   HCT 36.0 05/23/2018   MCV 81.8 05/23/2018   PLT 262.0 05/23/2018   Lab Results  Component Value Date   NA 140 05/23/2018   K 4.1 05/23/2018   CO2 28 05/23/2018   GLUCOSE 83 05/23/2018   BUN 14 05/23/2018   CREATININE 0.70 05/23/2018   BILITOT 0.5 05/23/2018   ALKPHOS 61 05/23/2018   AST 15 05/23/2018   ALT 16 05/23/2018   PROT 7.2 05/23/2018   ALBUMIN 4.2 05/23/2018   CALCIUM 9.4 05/23/2018   ANIONGAP 8 05/31/2016   GFR 83.30 05/23/2018   Lab Results  Component Value Date   CHOL 179 02/18/2018   Lab Results  Component Value Date   HDL 54.40 02/18/2018   Lab Results  Component  Value Date   LDLCALC 92 09/13/2017   Lab Results  Component Value Date   TRIG 208.0 (H) 02/18/2018   Lab Results  Component Value Date   CHOLHDL 3 02/18/2018   Lab Results  Component Value Date   HGBA1C 6.0 02/18/2018       Assessment & Plan:   Problem List Items Addressed This Visit    HTN (hypertension) (Chronic)    Patient to monitor and report any concerns.       Vertigo    Is doing better but still has episodes with rapid movement and when bending head back, no syncope or falls since her last visit. Recent in March CTA of neck showed 1. Narrowing of the vertebral arteries bilaterally at C2. This does not appear to be traumatic or to be affected by patient motion. 2. Fetal type posterior cerebral arteries are fed by the anterior circulation. 3. No significant atherosclerotic disease. 4. Mild degenerative changes in the cervical spine at C5-6 and C6-7.  She is stable and mildly symptomatic so in the current Covid state we will hold off on referral to neurosurgery or vascular surgery for consultation at this time. If her symptoms worsen she will let us know. She is advised to try starting an 81 mg ECASA tab daily. Stop if any concerning side effects develop and/or let us know so we can discuss further options.          I am having Christean Grief. Pete maintain her fexofenadine, acetaminophen, benzocaine, Lidocaine-Hydrocortisone Ace, SIMPLY SALINE NA, meclizine, ondansetron, metoprolol succinate, mometasone, famotidine, and esomeprazole.  No orders of the defined types were placed in this encounter.    I discussed the assessment and treatment plan with the patient. The patient was provided an opportunity to ask questions and all were answered. The patient agreed with the plan and demonstrated an understanding of the instructions.   The patient was advised to call back or seek an in-person evaluation if the symptoms worsen or if the condition fails to improve as  anticipated.  I provided 75minutes of non-face-to-face time during this encounter.   Penni Homans, MD

## 2018-07-08 NOTE — Assessment & Plan Note (Signed)
Is doing better but still has episodes with rapid movement and when bending head back, no syncope or falls since her last visit. Recent in March CTA of neck showed 1. Narrowing of the vertebral arteries bilaterally at C2. This does not appear to be traumatic or to be affected by patient motion. 2. Fetal type posterior cerebral arteries are fed by the anterior circulation. 3. No significant atherosclerotic disease. 4. Mild degenerative changes in the cervical spine at C5-6 and C6-7.  She is stable and mildly symptomatic so in the current Covid state we will hold off on referral to neurosurgery or vascular surgery for consultation at this time. If her symptoms worsen she will let us know. She is advised to try starting an 81 mg ECASA tab daily. Stop if any concerning side effects develop and/or let us know so we can discuss further options.

## 2018-07-18 ENCOUNTER — Other Ambulatory Visit: Payer: Self-pay

## 2018-07-18 ENCOUNTER — Ambulatory Visit (INDEPENDENT_AMBULATORY_CARE_PROVIDER_SITE_OTHER): Payer: Medicare Other | Admitting: Family Medicine

## 2018-07-18 ENCOUNTER — Other Ambulatory Visit: Payer: Self-pay | Admitting: Family Medicine

## 2018-07-18 DIAGNOSIS — N390 Urinary tract infection, site not specified: Secondary | ICD-10-CM

## 2018-07-18 DIAGNOSIS — I10 Essential (primary) hypertension: Secondary | ICD-10-CM | POA: Diagnosis not present

## 2018-07-18 MED ORDER — SULFAMETHOXAZOLE-TRIMETHOPRIM 800-160 MG PO TABS: 1.0000 | ORAL_TABLET | Freq: Two times a day (BID) | ORAL | 0 refills | Status: DC

## 2018-07-18 MED FILL — ESOMEPRAZOLE MAGNESIUM 20 M: 20 | 30 days supply | Qty: 30 | Fill #1

## 2018-07-18 MED FILL — SULFAMETHOXAZOLE-TMP DS TAB: 800-160 | 7 days supply | Qty: 14 | Fill #0

## 2018-07-20 DIAGNOSIS — N390 Urinary tract infection, site not specified: Secondary | ICD-10-CM | POA: Insufficient documentation

## 2018-07-20 NOTE — Assessment & Plan Note (Signed)
Has had urinary frequency, urgency, dysuria and a sense of incomplete voiding. Likely UTI, is treated with ABX, AZO and hydration if no response then notify us and we will have her come in for urine culture.

## 2018-07-20 NOTE — Assessment & Plan Note (Signed)
no changes to meds. Encouraged heart healthy diet such as the DASH diet and exercise as tolerated.  

## 2018-07-20 NOTE — Progress Notes (Signed)
Virtual Visit via Video Note  I connected with Carrie Elliott on 07/20/18 at  8:00 AM EDT by a video enabled telemedicine application and verified that I am speaking with the correct person using two identifiers.   I discussed the limitations of evaluation and management by telemedicine and the availability of in person appointments. The patient expressed understanding and agreed to proceed.     Subjective:    Patient ID: Carrie Elliott, female    DOB: 11/03/1950, 68 y.o.   MRN: [id number]  No chief complaint on file.   HPI Patient is in today for evaluation of new onset urinary symptoms. She denies a history of urinary tract infection but in past couple of days she has noted urinary frequency, urgency, dysuria and a sense of incomplete voiding. She denies any fevers, chills, abdominal or back pain. No hematuria or flank pain. Denies CP/palp/SOB/HA/congestion/fevers/GI c/o. Taking meds as prescribed  Past Medical History:  Diagnosis Date  . Allergic rhinitis   . Allergy   . Anemia 08/24/2014  . Arthritis    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, shoulders, knees  . Benign paroxysmal positional vertigo 07/12/2013  . Chicken pox   . Cystocele    grade 2  . Endometriosis    with Menometrorhaghia   . Gastric polyps 09/30/2015  . Gastroesophageal reflux disease with hiatal hernia    Cough Sees Dr Laretta Bolster   . GERD (gastroesophageal reflux disease)   . Hemorrhoids   . History of colonic polyps   . History of colonic polyps 09/30/2015   adenoma  . HTN (hypertension) 04/23/2013  . Hypocalcemia 09/30/2015  . Hyponatremia 11/23/2016  . IBS (irritable bowel syndrome)   . Measles   . Mumps   . Muscle spasm 09/30/2015  . Osteopenia 10/09/2015  . Rectocele    grade 2  . Reflux    cough  . Rosacea   . Sternal fracture    1986  . Sun-damaged skin 09/05/2014  . Vertigo 11/23/2016  . Vitamin D deficiency 04/06/2016  . 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 APPENDECTOMY   1989  . atypical nevus excision    . BREAST BIOPSY Right   . CHOLECYSTECTOMY  1989  . COLONOSCOPY  2011  . COLONOSCOPY WITH PROPOFOL N/A 05/27/2015   Procedure: COLONOSCOPY WITH PROPOFOL;  Surgeon: Clarene Essex, MD;  Location: [facility];  Service: Endoscopy;  Laterality: N/A;  . CYSTOSCOPY W/ URETERAL STENT REMOVAL  1999  . ESOPHAGOGASTRODUODENOSCOPY  2011   with esophageal stent, perforation   . ESOPHAGOGASTRODUODENOSCOPY (EGD) WITH PROPOFOL N/A 05/27/2015   Procedure: ESOPHAGOGASTRODUODENOSCOPY (EGD) WITH PROPOFOL;  Surgeon: Clarene Essex, MD;  Location: [facility];  Service: Endoscopy;  Laterality: N/A;  . HEMORRHOID SURGERY  2005  . LEFT OOPHORECTOMY  9735   complicated by ureteral injuery requiring a ureteral stent   . MMK / ANTERIOR VESICOURETHROPEXY / URETHROPEXY  2000  . TONSILLECTOMY AND ADENOIDECTOMY    . TOTAL ABDOMINAL HYSTERECTOMY  2000  . TUBAL LIGATION  1981  . wisdom teeth removal  1964    Family History  Problem Relation Age of Onset  . Colon polyps Father   . Hypertension Father   . Heart failure Father   . Melanoma Father        multiple  . Heart attack Father        x2  . Heart disease Father        Bundle Block  . Meniere's  disease Father   . Cancer Father        melanoma  . Osteoporosis Mother   . Transient ischemic attack Mother   . Cancer Mother        colon  . Heart disease Mother        afib  . Hyperlipidemia Mother   . Diabetes Paternal Grandmother   . Stroke Maternal Grandmother   . Cancer Maternal Grandmother        skin, melanoma, sarcoma  . Colon polyps Maternal Grandmother   . Diverticulitis Maternal Grandmother   . Stroke Maternal Grandfather   . Heart disease Maternal Grandfather   . Obesity Brother     Social History   Socioeconomic History  . Marital status: Married    Spouse name: Not on file  . Number of children: Not on file  . Years of education: Not on file  . Highest education level: Not on file  Occupational History   . Not on file  Social Needs  . Financial resource strain: Not on file  . Food insecurity:    Worry: Not on file    Inability: Not on file  . Transportation needs:    Medical: Not on file    Non-medical: Not on file  Tobacco Use  . Smoking status: Never Smoker  . Smokeless tobacco: Never Used  Substance and Sexual Activity  . Alcohol use: No  . Drug use: No  . 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 Physical activity:    Days per week: Not on file    Minutes per session: Not on file  . Stress: Not on file  Relationships  . Social connections:    Talks on phone: Not on file    Gets together: Not on file    Attends religious service: Not on file    Active member of club or organization: Not on file    Attends meetings of clubs or organizations: Not on file    Relationship status: Not on file  . Intimate partner violence:    Fear of current or ex partner: Not on file    Emotionally abused: Not on file    Physically abused: Not on file    Forced sexual activity: Not on file  Other Topics Concern  . Not on file  Social History Narrative  . Not on file    Outpatient Medications Prior to Visit  Medication Sig Dispense Refill  . acetaminophen (TYLENOL) 500 MG tablet Take 1,000 mg by mouth every 6 (six) hours as needed for headache (pain).    . benzocaine (ORAJEL) 10 % mucosal gel Use as directed 1 application in the mouth or throat as needed for mouth pain. Reported on 07/22/2015    . esomeprazole (NEXIUM) 40 MG capsule   3  . famotidine (PEPCID) 20 MG tablet     . fexofenadine (ALLEGRA) 180 MG tablet Take 180 mg by mouth as needed.     . Lidocaine-Hydrocortisone Ace 3-0.5 % CREA Apply 1 application topically daily as needed (hemmoriods.).     Marland Kitchen meclizine (ANTIVERT) 25 MG tablet Take 1 tablet (25 mg total) by mouth 3 (three) times daily as needed for dizziness. 30 tablet 2  . metoprolol succinate  (TOPROL-XL) 50 MG 24 hr tablet Take 1 tablet (50 mg total) by mouth daily. Take with or immediately following a meal. 90 tablet 3  . mometasone (NASONEX) 50  MCG/ACT nasal spray Place 1-2 sprays into the nose daily as needed. Reported on 07/22/2015 17 g 5  . ondansetron (ZOFRAN) 8 MG tablet Take 1 tablet (8 mg total) by mouth every 8 (eight) hours as needed for nausea or vomiting. 90 tablet 1  . SIMPLY SALINE NA Place 1 each into the nose daily.     Marland Kitchen sulfamethoxazole-trimethoprim (BACTRIM DS) 800-160 MG tablet Take 1 tablet by mouth 2 (two) times daily. 14 tablet 0   No facility-administered medications prior to visit.     Allergies  Allergen Reactions  . Cefdinir     HIVES  . Celebrex [Celecoxib]     ABDOMINAL PAIN   . Ciprofloxacin     ABDOMINAL PAIN   . Codeine     NAUSEA VOMITING - can take with zofran  . Pseudoephedrine Hcl Er     TACHYCARDIA   . Singulair [Montelukast Sodium] Other (See Comments)    Mental status changes, irritable    Review of Systems  Constitutional: Negative for fever and malaise/fatigue.  HENT: Negative for congestion.   Eyes: Negative for blurred vision.  Respiratory: Negative for shortness of breath.   Cardiovascular: Negative for chest pain, palpitations and leg swelling.  Gastrointestinal: Negative for abdominal pain, blood in stool and nausea.  Genitourinary: Positive for dysuria, frequency and urgency. Negative for flank pain and hematuria.  Musculoskeletal: Negative for falls.  Skin: Negative for rash.  Neurological: Negative for dizziness, loss of consciousness and headaches.  Endo/Heme/Allergies: Negative for environmental allergies.  Psychiatric/Behavioral: Negative for depression. The patient is not nervous/anxious.        Objective:    Physical Exam Constitutional:      Appearance: Normal appearance. She is not ill-appearing.  HENT:     Nose: Nose normal.  Pulmonary:     Effort: Pulmonary effort is normal.  Neurological:      Mental Status: She is alert and oriented to person, place, and time.  Psychiatric:        Mood and Affect: Mood normal.        Behavior: Behavior normal.     There were no vitals taken for this visit. Wt Readings from Last 3 Encounters:  05/23/18 161 lb 12.8 oz (73.4 kg)  02/18/18 154 lb 9.6 oz (70.1 kg)  11/15/17 163 lb (73.9 kg)    Diabetic Foot Exam - Simple   No data filed     Lab Results  Component Value Date   WBC 5.1 05/23/2018   HGB 11.6 (L) 05/23/2018   HCT 36.0 05/23/2018   PLT 262.0 05/23/2018   GLUCOSE 83 05/23/2018   CHOL 179 02/18/2018   TRIG 208.0 (H) 02/18/2018   HDL 54.40 02/18/2018   LDLDIRECT 106.0 02/18/2018   LDLCALC 92 09/13/2017   ALT 16 05/23/2018   AST 15 05/23/2018   NA 140 05/23/2018   K 4.1 05/23/2018   CL 105 05/23/2018   CREATININE 0.70 05/23/2018   BUN 14 05/23/2018   CO2 28 05/23/2018   TSH 0.82 02/18/2018   INR 1.19 02/07/2010   HGBA1C 6.0 02/18/2018    Lab Results  Component Value Date   TSH 0.82 02/18/2018   Lab Results  Component Value Date   WBC 5.1 05/23/2018   HGB 11.6 (L) 05/23/2018   HCT 36.0 05/23/2018   MCV 81.8 05/23/2018   PLT 262.0 05/23/2018   Lab Results  Component Value Date   NA 140 05/23/2018   K 4.1 05/23/2018   CO2 28 05/23/2018  GLUCOSE 83 05/23/2018   BUN 14 05/23/2018   CREATININE 0.70 05/23/2018   BILITOT 0.5 05/23/2018   ALKPHOS 61 05/23/2018   AST 15 05/23/2018   ALT 16 05/23/2018   PROT 7.2 05/23/2018   ALBUMIN 4.2 05/23/2018   CALCIUM 9.4 05/23/2018   ANIONGAP 8 05/31/2016   GFR 83.30 05/23/2018   Lab Results  Component Value Date   CHOL 179 02/18/2018   Lab Results  Component Value Date   HDL 54.40 02/18/2018   Lab Results  Component Value Date   LDLCALC 92 09/13/2017   Lab Results  Component Value Date   TRIG 208.0 (H) 02/18/2018   Lab Results  Component Value Date   CHOLHDL 3 02/18/2018   Lab Results  Component Value Date   HGBA1C 6.0 02/18/2018        Assessment & Plan:   Problem List Items Addressed This Visit    HTN (hypertension) (Chronic)     no changes to meds. Encouraged heart healthy diet such as the DASH diet and exercise as tolerated.       UTI (urinary tract infection)    Has had urinary frequency, urgency, dysuria and a sense of incomplete voiding. Likely UTI, is treated with ABX, AZO and hydration if no response then notify us and we will have her come in for urine culture.          I am having Carrie Elliott. Mentel maintain her fexofenadine, acetaminophen, benzocaine, Lidocaine-Hydrocortisone Ace, SIMPLY SALINE NA, meclizine, ondansetron, metoprolol succinate, mometasone, famotidine, esomeprazole, and sulfamethoxazole-trimethoprim.  No orders of the defined types were placed in this encounter.    I discussed the assessment and treatment plan with the patient. The patient was provided an opportunity to ask questions and all were answered. The patient agreed with the plan and demonstrated an understanding of the instructions.   The patient was advised to call back or seek an in-person evaluation if the symptoms worsen or if the condition fails to improve as anticipated.  I provided 15 minutes of non-face-to-face time during this encounter.   Penni Homans, MD

## 2018-07-21 MED FILL — ESOMEPRAZOLE MAG DR 40 MG C: 40 | 30 days supply | Qty: 30 | Fill #2

## 2018-08-07 DIAGNOSIS — D485 Neoplasm of uncertain behavior of skin: Secondary | ICD-10-CM | POA: Diagnosis not present

## 2018-08-07 DIAGNOSIS — L820 Inflamed seborrheic keratosis: Secondary | ICD-10-CM | POA: Diagnosis not present

## 2018-09-02 MED FILL — METOPROLOL SUCCINATE ER 50: 50 | 60 days supply | Qty: 60 | Fill #4

## 2018-09-03 ENCOUNTER — Encounter (HOSPITAL_BASED_OUTPATIENT_CLINIC_OR_DEPARTMENT_OTHER): Payer: Self-pay | Admitting: Emergency Medicine

## 2018-09-03 ENCOUNTER — Ambulatory Visit: Payer: Self-pay | Admitting: *Deleted

## 2018-09-03 ENCOUNTER — Emergency Department (HOSPITAL_BASED_OUTPATIENT_CLINIC_OR_DEPARTMENT_OTHER)
Admission: EM | Admit: 2018-09-03 | Discharge: 2018-09-03 | Disposition: A | Discharge status: Home / Self Care | Payer: Medicare Other | Attending: Emergency Medicine | Admitting: Emergency Medicine

## 2018-09-03 ENCOUNTER — Other Ambulatory Visit: Payer: Self-pay

## 2018-09-03 DIAGNOSIS — I10 Essential (primary) hypertension: Secondary | ICD-10-CM | POA: Diagnosis not present

## 2018-09-03 DIAGNOSIS — I159 Secondary hypertension, unspecified: Secondary | ICD-10-CM

## 2018-09-03 DIAGNOSIS — Z79899 Other long term (current) drug therapy: Secondary | ICD-10-CM | POA: Insufficient documentation

## 2018-09-03 NOTE — Telephone Encounter (Signed)
Pt has a question about her uo and down bp has an appt with Dr Charlett Blake tomorrow  Patient has noticed her BP slowly going up during the past month. Patient reports 154/108 and 168/102 today. Patient states 130/40/80- patient states it has not been this high.  Patient reports not as active and 10 lb weight gain 163 lb yesterday. Call to office advised will be sending patient to ED for elevated BP- she will follow up at appointment tomorrow.  Reason for Disposition . [0] Systolic BP  >= 867 OR Diastolic >= 619 AND [5] cardiac or neurologic symptoms (e.g., chest pain, difficulty breathing, unsteady gait, blurred vision)  Answer Assessment - Initial Assessment Questions 1. BLOOD PRESSURE: "What is the blood pressure?" "Did you take at least two measurements 5 minutes apart?"     168/102 P 74,  168/99 P 80 2. ONSET: "When did you take your blood pressure?"     9:15, 10:10 3. HOW: "How did you obtain the blood pressure?" (e.g., visiting nurse, automatic home BP monitor)     Automatic cuff 4. HISTORY: "Do you have a history of high blood pressure?"     yes 5. MEDICATIONS: "Are you taking any medications for blood pressure?" "Have you missed any doses recently?"     Yes- no changes 6. OTHER SYMPTOMS: "Do you have any symptoms?" (e.g., headache, chest pain, blurred vision, difficulty breathing, weakness)     Frequent headache, some vision blurring and dizziness- but patient has history and never related to BP 7. PREGNANCY: "Is there any chance you are pregnant?" "When was your last menstrual period?"     n/a  Protocols used: HIGH BLOOD PRESSURE-A-AH

## 2018-09-03 NOTE — ED Provider Notes (Signed)
[facility] EMERGENCY DEPARTMENT Provider Note   CSN: [id number] Arrival date & time: 09/03/18  1116    History   Chief Complaint Chief Complaint  Patient presents with  . Hypertension    HPI Carrie Elliott is a 68 y.o. female.     The history is provided by the patient.  Hypertension  This is a chronic problem. The problem occurs daily. The problem has not changed since onset.Pertinent negatives include no chest pain, no abdominal pain, no headaches and no shortness of breath. Nothing aggravates the symptoms. Nothing relieves the symptoms. She has tried nothing for the symptoms. The treatment provided no relief.    Past Medical History:  Diagnosis Date  . Allergic rhinitis   . Allergy   . Anemia 08/24/2014  . Arthritis    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, shoulders, knees  . Benign paroxysmal positional vertigo 07/12/2013  . Chicken pox   . Cystocele    grade 2  . Endometriosis    with Menometrorhaghia   . Gastric polyps 09/30/2015  . Gastroesophageal reflux disease with hiatal hernia    Cough Sees Dr Laretta Bolster   . GERD (gastroesophageal reflux disease)   . Hemorrhoids   . History of colonic polyps   . History of colonic polyps 09/30/2015   adenoma  . HTN (hypertension) 04/23/2013  . Hypocalcemia 09/30/2015  . Hyponatremia 11/23/2016  . IBS (irritable bowel syndrome)   . Measles   . Mumps   . Muscle spasm 09/30/2015  . Osteopenia 10/09/2015  . Rectocele    grade 2  . Reflux    cough  . Rosacea   . Sternal fracture    1986  . Sun-damaged skin 09/05/2014  . Vertigo 11/23/2016  . Vitamin D deficiency 04/06/2016  . 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 UTI (urinary tract infection) 07/20/2018  . Right lower quadrant abdominal pain 05/23/2018  . Vitamin B12 deficiency 02/18/2018  . Hyperglycemia 09/15/2017  . Bruit of right carotid artery 05/26/2017  . Hyponatremia 11/23/2016  . Vertigo 11/23/2016  . Hyperlipidemia 11/23/2016   . Vitamin D deficiency 04/06/2016  . Candidal dermatitis 04/06/2016  . Osteopenia 10/09/2015  . Hypocalcemia 09/30/2015  . 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 History of colonic polyps 09/30/2015  . Gastric polyps 09/30/2015  . Hip pain 09/05/2014  . Urinary frequency 09/05/2014  . Preventative health care 09/05/2014  . Sun-damaged skin 09/05/2014  . Anemia 08/24/2014  . Cervical cancer screening 08/24/2014  . Myalgia and myositis 02/11/2014  . Benign paroxysmal positional vertigo 07/12/2013  . History of chicken pox   . Chest pain 04/23/2013  . HTN (hypertension) 04/23/2013  . Headache(784.0) 04/23/2013  . Nodule of left lung 03/30/2011  . Allergic rhinitis   . Arthritis   . Gastroesophageal reflux disease with hiatal hernia   . IBS (irritable bowel syndrome)     Past Surgical History:  Procedure Laterality Date  . APPENDECTOMY  1989  . atypical nevus excision    . BREAST BIOPSY Right   . CHOLECYSTECTOMY  1989  . COLONOSCOPY  2011  . COLONOSCOPY WITH PROPOFOL N/A 05/27/2015   Procedure: COLONOSCOPY WITH PROPOFOL;  Surgeon: Clarene Essex, MD;  Location: [facility];  Service: Endoscopy;  Laterality: N/A;  . CYSTOSCOPY W/ URETERAL STENT REMOVAL  1999  . ESOPHAGOGASTRODUODENOSCOPY  2011   with esophageal stent, perforation   . ESOPHAGOGASTRODUODENOSCOPY (EGD) WITH PROPOFOL N/A 05/27/2015   Procedure: ESOPHAGOGASTRODUODENOSCOPY (EGD)  WITH PROPOFOL;  Surgeon: Clarene Essex, MD;  Location: [facility];  Service: Endoscopy;  Laterality: N/A;  . HEMORRHOID SURGERY  2005  . LEFT OOPHORECTOMY  1610   complicated by ureteral injuery requiring a ureteral stent   . MMK / ANTERIOR VESICOURETHROPEXY / URETHROPEXY  2000  . TONSILLECTOMY AND ADENOIDECTOMY    . TOTAL ABDOMINAL HYSTERECTOMY  2000  . TUBAL LIGATION  1981  . wisdom teeth removal  1964     OB History   No obstetric history on file.      Home Medications    Prior to Admission medications   Medication Sig Start Date End Date  Taking? Authorizing Provider  acetaminophen (TYLENOL) 500 MG tablet Take 1,000 mg by mouth every 6 (six) hours as needed for headache (pain).   Yes [provider]  esomeprazole (NEXIUM) 40 MG capsule  02/06/18  Yes [provider]  famotidine (PEPCID) 20 MG tablet  01/09/18  Yes [provider]  Lidocaine-Hydrocortisone Ace 3-0.5 % CREA Apply 1 application topically daily as needed (hemmoriods.).    Yes [provider]  metoprolol succinate (TOPROL-XL) 50 MG 24 hr tablet Take 1 tablet (50 mg total) by mouth daily. Take with or immediately following a meal. 09/13/17  Yes Mosie Lukes, MD  mometasone (NASONEX) 50 MCG/ACT nasal spray Place 1-2 sprays into the nose daily as needed. Reported on 07/22/2015 09/13/17  Yes Mosie Lukes, MD  SIMPLY SALINE NA Place 1 each into the nose daily.    Yes [provider]  benzocaine (ORAJEL) 10 % mucosal gel Use as directed 1 application in the mouth or throat as needed for mouth pain. Reported on 07/22/2015    [provider]  fexofenadine (ALLEGRA) 180 MG tablet Take 180 mg by mouth as needed.     [provider]  meclizine (ANTIVERT) 25 MG tablet Take 1 tablet (25 mg total) by mouth 3 (three) times daily as needed for dizziness. 05/24/17   Mosie Lukes, MD  ondansetron (ZOFRAN) 8 MG tablet Take 1 tablet (8 mg total) by mouth every 8 (eight) hours as needed for nausea or vomiting. 05/24/17   Mosie Lukes, MD  sulfamethoxazole-trimethoprim (BACTRIM DS) 800-160 MG tablet Take 1 tablet by mouth 2 (two) times daily. 07/18/18   Mosie Lukes, MD    Family History Family History  Problem Relation Age of Onset  . Colon polyps Father   . Hypertension Father   . Heart failure Father   . Melanoma Father        multiple  . Heart attack Father        x2  . Heart disease Father        Bundle Block  . Meniere's disease Father   . Cancer Father        melanoma  . Osteoporosis Mother   . Transient  ischemic attack Mother   . Cancer Mother        colon  . Heart disease Mother        afib  . Hyperlipidemia Mother   . Diabetes Paternal Grandmother   . Stroke Maternal Grandmother   . Cancer Maternal Grandmother        skin, melanoma, sarcoma  . Colon polyps Maternal Grandmother   . Diverticulitis Maternal Grandmother   . Stroke Maternal Grandfather   . Heart disease Maternal Grandfather   . Obesity Brother     Social History Social History   Tobacco Use  .  Smoking status: Never Smoker  . Smokeless tobacco: Never Used  Substance Use Topics  . Alcohol use: No  . Drug use: No     Allergies   Cefdinir; Celebrex [celecoxib]; Ciprofloxacin; Codeine; Pseudoephedrine hcl er; and Singulair [montelukast sodium]   Review of Systems Review of Systems  Constitutional: Negative for chills and fever.  HENT: Negative for ear pain and sore throat.   Eyes: Negative for pain and visual disturbance.  Respiratory: Negative for cough and shortness of breath.   Cardiovascular: Negative for chest pain and palpitations.  Gastrointestinal: Negative for abdominal pain and vomiting.  Genitourinary: Negative for dysuria and hematuria.  Musculoskeletal: Negative for arthralgias and back pain.  Skin: Negative for color change and rash.  Neurological: Negative for seizures, syncope and headaches.  All other systems reviewed and are negative.    Physical Exam Updated Vital Signs BP (!) 150/82   Pulse 80   Temp 98.2 F (36.8 C) (Oral)   Resp 18   Ht 5' 0.5" (1.537 m)   Wt 73.9 kg   SpO2 99%   BMI 31.31 kg/m   Physical Exam Vitals signs and nursing note reviewed.  Constitutional:      General: She is not in acute distress.    Appearance: She is well-developed.  HENT:     Head: Normocephalic and atraumatic.     Mouth/Throat:     Mouth: Mucous membranes are moist.  Eyes:     Extraocular Movements: Extraocular movements intact.     Conjunctiva/sclera: Conjunctivae normal.      Pupils: Pupils are equal, round, and reactive to light.  Neck:     Musculoskeletal: Neck supple.  Cardiovascular:     Rate and Rhythm: Normal rate and regular rhythm.     Pulses: Normal pulses.     Heart sounds: Normal heart sounds. No murmur.  Pulmonary:     Effort: Pulmonary effort is normal. No respiratory distress.     Breath sounds: Normal breath sounds.  Abdominal:     Palpations: Abdomen is soft.     Tenderness: There is no abdominal tenderness.  Skin:    General: Skin is warm and dry.  Neurological:     General: No focal deficit present.     Mental Status: She is alert and oriented to person, place, and time.     Cranial Nerves: No cranial nerve deficit.     Sensory: No sensory deficit.     Motor: No weakness.     Coordination: Coordination normal.     Gait: Gait normal.     Comments: 5+ out of 5 strength throughout, normal sensation, normal finger-nose-finger      ED Treatments / Results  Labs (all labs ordered are listed, but only abnormal results are displayed) Labs Reviewed - No data to display  EKG None  Radiology No results found.  Procedures Procedures (including critical care time)  Medications Ordered in ED Medications - No data to display   Initial Impression / Assessment and Plan / ED Course  I have reviewed the triage vital signs and the nursing notes.  Pertinent labs & imaging results that were available during my care of the patient were reviewed by me and considered in my medical decision making (see chart for details).     Carrie Elliott is a 68 year old female history of hypertension who presents to the ED with high blood pressure.  Patient with overall unremarkable blood pressure here.  150/82.  Patient denies any chest pain, strokelike  symptoms.  Exam is overall unremarkable.  No neurological findings.  Suspect that this is ongoing and chronic.  She was sent here for possible hypertensive crisis although she does not have any pain, no  stroke symptoms.  She is asymptomatic.  She has follow-up with primary care doctor tomorrow.  Likely could benefit from medication change.  Given return precautions and discharged in ED good condition.  This chart was dictated using voice recognition software.  Despite best efforts to proofread,  errors can occur which can change the documentation meaning.    Final Clinical Impressions(s) / ED Diagnoses   Final diagnoses:  Secondary hypertension    ED Discharge Orders    None       Lennice Sites, DO 09/03/18 1217

## 2018-09-03 NOTE — ED Triage Notes (Signed)
Pt sts her bp has been inching up over the past couple weeks. Takes Metoprolol 50mg  QD.  Has a pmd appt tomorrow but called this morning and reported bp 154/105 and 168/102 this morning and sts Dr. Randel Pigg told her that is a "hypertensive crisis" and sent her to the ED for eval. She was not able to see the pt today.

## 2018-09-03 NOTE — ED Notes (Signed)
ED Provider at bedside. 

## 2018-09-04 ENCOUNTER — Ambulatory Visit (INDEPENDENT_AMBULATORY_CARE_PROVIDER_SITE_OTHER): Payer: Medicare Other | Admitting: Family Medicine

## 2018-09-04 DIAGNOSIS — I10 Essential (primary) hypertension: Secondary | ICD-10-CM

## 2018-09-04 DIAGNOSIS — G4710 Hypersomnia, unspecified: Secondary | ICD-10-CM | POA: Diagnosis not present

## 2018-09-04 DIAGNOSIS — G479 Sleep disorder, unspecified: Secondary | ICD-10-CM | POA: Diagnosis not present

## 2018-09-04 DIAGNOSIS — G4730 Sleep apnea, unspecified: Secondary | ICD-10-CM | POA: Insufficient documentation

## 2018-09-04 DIAGNOSIS — R739 Hyperglycemia, unspecified: Secondary | ICD-10-CM | POA: Diagnosis not present

## 2018-09-04 DIAGNOSIS — R0683 Snoring: Secondary | ICD-10-CM

## 2018-09-04 DIAGNOSIS — E559 Vitamin D deficiency, unspecified: Secondary | ICD-10-CM

## 2018-09-04 MED ORDER — METOPROLOL SUCCINATE ER 50 MG PO TB24: 50.0000 mg | ORAL_TABLET | Freq: Two times a day (BID) | ORAL | 1 refills | Status: DC

## 2018-09-04 NOTE — Assessment & Plan Note (Signed)
Supplement and monitor 

## 2018-09-04 NOTE — Assessment & Plan Note (Signed)
hgba1c acceptable, minimize simple carbs. Increase exercise as tolerated.  

## 2018-09-04 NOTE — Progress Notes (Signed)
Virtual Visit via Video Note  I connected with Carrie Elliott on 09/04/18 at  2:20 PM EDT by a video enabled telemedicine application and verified that I am speaking with the correct person using two identifiers.  Location: Patient: home Provider: office   I discussed the limitations of evaluation and management by telemedicine and the availability of in person appointments. The patient expressed understanding and agreed to proceed. Magdalene Molly, CMA was able to get the patient set up on a video visit.     Subjective:    Patient ID: Carrie Elliott, female    DOB: Mar 28, 1950, 68 y.o.   MRN: [id number]  No chief complaint on file.   HPI Patient is in today for follow up on chronic medical concerns including hypertension, Vitamin D deficiency, and hyperlipidemia. No recent febrile illness or hospitalizations. Notes her blood pressure has been running very high so she presented to the emergency room. No changes were made. Denies CP/palp/SOB/congestion/fevers/GI or GU c/o. Taking meds as prescribed  Past Medical History:  Diagnosis Date  . Allergic rhinitis   . Allergy   . Anemia 08/24/2014  . Arthritis    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, shoulders, knees  . Benign paroxysmal positional vertigo 07/12/2013  . Chicken pox   . Cystocele    grade 2  . Endometriosis    with Menometrorhaghia   . Gastric polyps 09/30/2015  . Gastroesophageal reflux disease with hiatal hernia    Cough Sees Dr Laretta Bolster   . GERD (gastroesophageal reflux disease)   . Hemorrhoids   . History of colonic polyps   . History of colonic polyps 09/30/2015   adenoma  . HTN (hypertension) 04/23/2013  . Hypocalcemia 09/30/2015  . Hyponatremia 11/23/2016  . IBS (irritable bowel syndrome)   . Measles   . Mumps   . Muscle spasm 09/30/2015  . Osteopenia 10/09/2015  . Rectocele    grade 2  . Reflux    cough  . Rosacea   . Sternal fracture    1986  . Sun-damaged skin 09/05/2014  . Vertigo 11/23/2016  . Vitamin D deficiency 04/06/2016  .  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 APPENDECTOMY  1989  . atypical nevus excision    . BREAST BIOPSY Right   . CHOLECYSTECTOMY  1989  . COLONOSCOPY  2011  . COLONOSCOPY WITH PROPOFOL N/A 05/27/2015   Procedure: COLONOSCOPY WITH PROPOFOL;  Surgeon: Clarene Essex, MD;  Location: [facility];  Service: Endoscopy;  Laterality: N/A;  . CYSTOSCOPY W/ URETERAL STENT REMOVAL  1999  . ESOPHAGOGASTRODUODENOSCOPY  2011   with esophageal stent, perforation   . ESOPHAGOGASTRODUODENOSCOPY (EGD) WITH PROPOFOL N/A 05/27/2015   Procedure: ESOPHAGOGASTRODUODENOSCOPY (EGD) WITH PROPOFOL;  Surgeon: Clarene Essex, MD;  Location: [facility];  Service: Endoscopy;  Laterality: N/A;  . HEMORRHOID SURGERY  2005  . LEFT OOPHORECTOMY  0093   complicated by ureteral injuery requiring a ureteral stent   . MMK / ANTERIOR VESICOURETHROPEXY / URETHROPEXY  2000  . TONSILLECTOMY AND ADENOIDECTOMY    . TOTAL ABDOMINAL HYSTERECTOMY  2000  . TUBAL LIGATION  1981  . wisdom teeth removal  1964    Family History  Problem Relation Age of Onset  . Colon polyps Father   . Hypertension Father   . Heart failure Father   . Melanoma Father        multiple  . Heart attack Father        x2  .  Heart disease Father        Bundle Block  . Meniere's disease Father   . Cancer Father        melanoma  . Osteoporosis Mother   . Transient ischemic attack Mother   . Cancer Mother        colon  . Heart disease Mother        afib  . Hyperlipidemia Mother   . Diabetes Paternal Grandmother   . Stroke Maternal Grandmother   . Cancer Maternal Grandmother        skin, melanoma, sarcoma  . Colon polyps Maternal Grandmother   . Diverticulitis Maternal Grandmother   . Stroke Maternal Grandfather   . Heart disease Maternal Grandfather   . Obesity Brother     Social History   Socioeconomic History  . Marital status: Married    Spouse name: Not on file  . Number of children: Not on file   . Years of education: Not on file  . Highest education level: Not on file  Occupational History  . Not on file  Social Needs  . Financial resource strain: Not on file  . Food insecurity    Worry: Not on file    Inability: Not on file  . Transportation needs    Medical: Not on file    Non-medical: Not on file  Tobacco Use  . Smoking status: Never Smoker  . Smokeless tobacco: Never Used  Substance and Sexual Activity  . Alcohol use: No  . Drug use: No  . 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 Physical activity    Days per week: Not on file    Minutes per session: Not on file  . Stress: Not on file  Relationships  . Social Herbalist on phone: Not on file    Gets together: Not on file    Attends religious service: Not on file    Active member of club or organization: Not on file    Attends meetings of clubs or organizations: Not on file    Relationship status: Not on file  . Intimate partner violence    Fear of current or ex partner: Not on file    Emotionally abused: Not on file    Physically abused: Not on file    Forced sexual activity: Not on file  Other Topics Concern  . Not on file  Social History Narrative  . Not on file    Outpatient Medications Prior to Visit  Medication Sig Dispense Refill  . acetaminophen (TYLENOL) 500 MG tablet Take 1,000 mg by mouth every 6 (six) hours as needed for headache (pain).    . benzocaine (ORAJEL) 10 % mucosal gel Use as directed 1 application in the mouth or throat as needed for mouth pain. Reported on 07/22/2015    . esomeprazole (NEXIUM) 40 MG capsule   3  . famotidine (PEPCID) 20 MG tablet     . fexofenadine (ALLEGRA) 180 MG tablet Take 180 mg by mouth as needed.     . Lidocaine-Hydrocortisone Ace 3-0.5 % CREA Apply 1 application topically daily as needed (hemmoriods.).     Marland Kitchen meclizine (ANTIVERT) 25 MG tablet Take 1 tablet (25 mg total) by  mouth 3 (three) times daily as needed for dizziness. 30 tablet 2  . mometasone (NASONEX) 50 MCG/ACT nasal spray Place 1-2 sprays into the nose daily as needed. Reported on  07/22/2015 17 g 5  . ondansetron (ZOFRAN) 8 MG tablet Take 1 tablet (8 mg total) by mouth every 8 (eight) hours as needed for nausea or vomiting. 90 tablet 1  . SIMPLY SALINE NA Place 1 each into the nose daily.     . metoprolol succinate (TOPROL-XL) 50 MG 24 hr tablet Take 1 tablet (50 mg total) by mouth daily. Take with or immediately following a meal. 90 tablet 3  . sulfamethoxazole-trimethoprim (BACTRIM DS) 800-160 MG tablet Take 1 tablet by mouth 2 (two) times daily. 14 tablet 0   No facility-administered medications prior to visit.     Allergies  Allergen Reactions  . Cefdinir     HIVES  . Celebrex [Celecoxib]     ABDOMINAL PAIN   . Ciprofloxacin     ABDOMINAL PAIN   . Codeine     NAUSEA VOMITING - can take with zofran  . Pseudoephedrine Hcl Er     TACHYCARDIA   . Singulair [Montelukast Sodium] Other (See Comments)    Mental status changes, irritable    Review of Systems  Constitutional: Positive for malaise/fatigue. Negative for fever.  HENT: Negative for congestion.   Eyes: Negative for blurred vision.  Respiratory: Negative for shortness of breath.   Cardiovascular: Negative for chest pain, palpitations and leg swelling.  Gastrointestinal: Negative for abdominal pain, blood in stool and nausea.  Genitourinary: Negative for dysuria and frequency.  Musculoskeletal: Negative for falls.  Skin: Negative for rash.  Neurological: Positive for dizziness and headaches. Negative for loss of consciousness.  Endo/Heme/Allergies: Negative for environmental allergies.  Psychiatric/Behavioral: Negative for depression. The patient is not nervous/anxious.        Objective:    Physical Exam Constitutional:      Appearance: Normal appearance. She is not ill-appearing.  HENT:     Head: Normocephalic and  atraumatic.     Nose: Nose normal.  Eyes:     General:        Right eye: No discharge.        Left eye: No discharge.  Pulmonary:     Effort: Pulmonary effort is normal.  Neurological:     Mental Status: She is alert and oriented to person, place, and time.  Psychiatric:        Mood and Affect: Mood normal.        Behavior: Behavior normal.     There were no vitals taken for this visit. Wt Readings from Last 3 Encounters:  09/03/18 163 lb (73.9 kg)  05/23/18 161 lb 12.8 oz (73.4 kg)  02/18/18 154 lb 9.6 oz (70.1 kg)    Diabetic Foot Exam - Simple   No data filed     Lab Results  Component Value Date   WBC 5.1 05/23/2018   HGB 11.6 (L) 05/23/2018   HCT 36.0 05/23/2018   PLT 262.0 05/23/2018   GLUCOSE 83 05/23/2018   CHOL 179 02/18/2018   TRIG 208.0 (H) 02/18/2018   HDL 54.40 02/18/2018   LDLDIRECT 106.0 02/18/2018   LDLCALC 92 09/13/2017   ALT 16 05/23/2018   AST 15 05/23/2018   NA 140 05/23/2018   K 4.1 05/23/2018   CL 105 05/23/2018   CREATININE 0.70 05/23/2018   BUN 14 05/23/2018   CO2 28 05/23/2018   TSH 0.82 02/18/2018   INR 1.19 02/07/2010   HGBA1C 6.0 02/18/2018    Lab Results  Component Value Date   TSH 0.82 02/18/2018   Lab Results  Component Value Date  WBC 5.1 05/23/2018   HGB 11.6 (L) 05/23/2018   HCT 36.0 05/23/2018   MCV 81.8 05/23/2018   PLT 262.0 05/23/2018   Lab Results  Component Value Date   NA 140 05/23/2018   K 4.1 05/23/2018   CO2 28 05/23/2018   GLUCOSE 83 05/23/2018   BUN 14 05/23/2018   CREATININE 0.70 05/23/2018   BILITOT 0.5 05/23/2018   ALKPHOS 61 05/23/2018   AST 15 05/23/2018   ALT 16 05/23/2018   PROT 7.2 05/23/2018   ALBUMIN 4.2 05/23/2018   CALCIUM 9.4 05/23/2018   ANIONGAP 8 05/31/2016   GFR 83.30 05/23/2018   Lab Results  Component Value Date   CHOL 179 02/18/2018   Lab Results  Component Value Date   HDL 54.40 02/18/2018   Lab Results  Component Value Date   LDLCALC 92 09/13/2017   Lab  Results  Component Value Date   TRIG 208.0 (H) 02/18/2018   Lab Results  Component Value Date   CHOLHDL 3 02/18/2018   Lab Results  Component Value Date   HGBA1C 6.0 02/18/2018       Assessment & Plan:   Problem List Items Addressed This Visit    HTN (hypertension) (Chronic)    Elevated will increase Metoprolol to 50 mg po bid. Have her monitor and reevaluate in 4-6 weeks       Relevant Medications   metoprolol succinate (TOPROL-XL) 50 MG 24 hr tablet   Vitamin D deficiency    Supplement and monitor      Hyperglycemia    hgba1c acceptable, minimize simple carbs. Increase exercise as tolerated.      Snoring - Primary    Restless sleep, worsening hypertension, hypersomnolence referred to pulmonology for evaluation of sleep apnea.       Relevant Orders   Ambulatory referral to Pulmonology    Other Visit Diagnoses    Hypersomnolence       Relevant Orders   Ambulatory referral to Pulmonology   Restless sleeper       Relevant Orders   Ambulatory referral to Pulmonology      I have discontinued Carrie Grief. Elliott's sulfamethoxazole-trimethoprim. I have also changed her metoprolol succinate. Additionally, I am having her maintain her fexofenadine, acetaminophen, benzocaine, Lidocaine-Hydrocortisone Ace, SIMPLY SALINE NA, meclizine, ondansetron, mometasone, famotidine, and esomeprazole.  Meds ordered this encounter  Medications  . metoprolol succinate (TOPROL-XL) 50 MG 24 hr tablet    Sig: Take 1 tablet (50 mg total) by mouth 2 (two) times daily. Take with or immediately following a meal.    Dispense:  180 tablet    Refill:  1    I discussed the assessment and treatment plan with the patient. The patient was provided an opportunity to ask questions and all were answered. The patient agreed with the plan and demonstrated an understanding of the instructions.   The patient was advised to call back or seek an in-person evaluation if the symptoms worsen or if the condition  fails to improve as anticipated.  I provided 25 minutes of non-face-to-face time during this encounter.   Penni Homans, MD

## 2018-09-04 NOTE — Assessment & Plan Note (Signed)
Elevated will increase Metoprolol to 50 mg po bid. Have her monitor and reevaluate in 4-6 weeks

## 2018-09-04 NOTE — Assessment & Plan Note (Signed)
Restless sleep, worsening hypertension, hypersomnolence referred to pulmonology for evaluation of sleep apnea.

## 2018-09-11 ENCOUNTER — Ambulatory Visit: Payer: Medicare Other | Admitting: Family Medicine

## 2018-09-15 ENCOUNTER — Other Ambulatory Visit: Payer: Self-pay | Admitting: Family Medicine

## 2018-09-15 MED ORDER — METOPROLOL SUCCINATE ER 50 MG PO TB24: 50.0000 mg | ORAL_TABLET | Freq: Two times a day (BID) | ORAL | 0 refills | Status: DC

## 2018-09-15 MED FILL — METOPROLOL SUCCINATE ER 50: 50 | 90 days supply | Qty: 360 | Fill #0

## 2018-09-15 NOTE — Progress Notes (Signed)
Patient reported during her husband's video visit that her blood pressure continues to run high despite her Metoprolol XR 50 mg being increased to BID. With 170s/100s being noted. Awaits pulmonology appointment for sleep study. For now will increase her Metoprolol XR to 100 mg qhs and 50 qam. Is still high then in crease to 100 mg bid. Her pulse has remained in the 70s and 80s. She will report any confirms and we will follow up in 3 weeks or as needed.

## 2018-09-18 ENCOUNTER — Encounter: Payer: Self-pay | Admitting: Family Medicine

## 2018-09-22 ENCOUNTER — Other Ambulatory Visit: Payer: Self-pay | Admitting: Family Medicine

## 2018-09-22 MED ORDER — LISINOPRIL 10 MG PO TABS: 10.0000 mg | ORAL_TABLET | Freq: Two times a day (BID) | ORAL | 1 refills | Status: DC

## 2018-09-22 NOTE — Progress Notes (Unsigned)
lis

## 2018-09-23 ENCOUNTER — Other Ambulatory Visit: Payer: Self-pay | Admitting: Family Medicine

## 2018-09-23 DIAGNOSIS — I10 Essential (primary) hypertension: Secondary | ICD-10-CM

## 2018-09-23 MED FILL — LISINOPRIL 10 MG TABLET: 10 | 30 days supply | Qty: 60 | Fill #0

## 2018-09-23 NOTE — Progress Notes (Signed)
Called patient to schedule a lab appointment in 2 weeks lab orders in. Also patient will need to schedule an appt with PCP a few days after labs are done.

## 2018-09-30 ENCOUNTER — Ambulatory Visit (INDEPENDENT_AMBULATORY_CARE_PROVIDER_SITE_OTHER): Payer: Medicare Other | Admitting: Pulmonary Disease

## 2018-09-30 ENCOUNTER — Other Ambulatory Visit: Payer: Self-pay

## 2018-09-30 ENCOUNTER — Encounter: Payer: Self-pay | Admitting: Pulmonary Disease

## 2018-09-30 VITALS — BP 126/78 | HR 75 | Temp 97.9°F | Ht 61.5 in | Wt 168.2 lb

## 2018-09-30 DIAGNOSIS — R5383 Other fatigue: Secondary | ICD-10-CM | POA: Diagnosis not present

## 2018-09-30 DIAGNOSIS — R0683 Snoring: Secondary | ICD-10-CM

## 2018-09-30 NOTE — Progress Notes (Signed)
Subjective:     Patient ID: Carrie Elliott, female   DOB: 05-08-1950, 68 y.o.   MRN: [id number]  Patient being evaluated for snoring, witnessed apnea  Patient has had snoring, witnessed apneas for many years Daytime fatigue and tiredness  Usually tries to go to bed between 10 and 10:30 PM Takes about 30 minutes to 1 hour to fall asleep Wakes up about once to 3 times during the night Wakes up about 6 AM on workdays  She is tired during the day  Has uncontrolled/poorly controlled hypertension with recent escalation in her medications  Never smoked  Does have some acid reflux No pertinent occupational history Has no memory problems  Wakes up tired on a daily basis    Review of Systems  Constitutional: Negative.   HENT: Positive for postnasal drip.   Eyes: Negative.   Respiratory: Positive for cough.   Cardiovascular: Positive for leg swelling.  Gastrointestinal: Negative.   Endocrine: Negative.   Genitourinary: Negative.   Musculoskeletal: Negative.   Skin: Negative.   Allergic/Immunologic: Positive for environmental allergies.  Neurological: Positive for headaches.  Hematological: Negative.   Psychiatric/Behavioral: Negative.    Past Medical History:  Diagnosis Date  . Allergic rhinitis   . Allergy   . Anemia 08/24/2014  . Arthritis    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, shoulders, knees  . Benign paroxysmal positional vertigo 07/12/2013  . Chicken pox   . Cystocele    grade 2  . Endometriosis    with Menometrorhaghia   . Gastric polyps 09/30/2015  . Gastroesophageal reflux disease with hiatal hernia    Cough Sees Dr Laretta Bolster   . GERD (gastroesophageal reflux disease)   . Hemorrhoids   . History of colonic polyps   . History of colonic polyps 09/30/2015   adenoma  . HTN (hypertension) 04/23/2013  . Hypocalcemia 09/30/2015  . Hyponatremia 11/23/2016  . IBS (irritable bowel syndrome)   . Measles   . Mumps   . Muscle spasm 09/30/2015  . Osteopenia 10/09/2015  . Rectocele    grade 2   . Reflux    cough  . Rosacea   . Sternal fracture    1986  . Sun-damaged skin 09/05/2014  . Vertigo 11/23/2016  . Vitamin D deficiency 04/06/2016  . 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 Marital status: Married    Spouse name: Not on file  . Number of children: Not on file  . Years of education: Not on file  . Highest education level: Not on file  Occupational History  . Not on file  Social Needs  . Financial resource strain: Not on file  . Food insecurity    Worry: Not on file    Inability: Not on file  . Transportation needs    Medical: Not on file    Non-medical: Not on file  Tobacco Use  . Smoking status: Never Smoker  . Smokeless tobacco: Never Used  Substance and Sexual Activity  . Alcohol use: No  . Drug use: No  . 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 Physical activity    Days per week: Not on file    Minutes per session: Not on file  . Stress: Not on file  Relationships  . Social Herbalist on phone: Not on file    Gets together: Not on file    Attends religious  service: Not on file    Active member of club or organization: Not on file    Attends meetings of clubs or organizations: Not on file    Relationship status: Not on file  . Intimate partner violence    Fear of current or ex partner: Not on file    Emotionally abused: Not on file    Physically abused: Not on file    Forced sexual activity: Not on file  Other Topics Concern  . Not on file  Social History Narrative  . Not on file   Family History  Problem Relation Age of Onset  . Colon polyps Father   . Hypertension Father   . Heart failure Father   . Melanoma Father        multiple  . Heart attack Father        x2  . Heart disease Father        Bundle Block  . Meniere's disease Father   . Cancer Father        melanoma  . Osteoporosis Mother   . Transient ischemic  attack Mother   . Cancer Mother        colon  . Heart disease Mother        afib  . Hyperlipidemia Mother   . Diabetes Paternal Grandmother   . Stroke Maternal Grandmother   . Cancer Maternal Grandmother        skin, melanoma, sarcoma  . Colon polyps Maternal Grandmother   . Diverticulitis Maternal Grandmother   . Stroke Maternal Grandfather   . Heart disease Maternal Grandfather   . Obesity Brother        Objective:   Physical Exam Constitutional:      Appearance: Normal appearance. She is obese.  HENT:     Head: Normocephalic and atraumatic.  Neck:     Musculoskeletal: Normal range of motion and neck supple. No neck rigidity or muscular tenderness.  Cardiovascular:     Rate and Rhythm: Normal rate and regular rhythm.     Heart sounds: No murmur.  Pulmonary:     Effort: Pulmonary effort is normal. No respiratory distress.     Breath sounds: Normal breath sounds. No stridor. No wheezing or rhonchi.  Abdominal:     General: There is no distension.     Tenderness: There is no abdominal tenderness.  Musculoskeletal: Normal range of motion.        General: No swelling.  Skin:    General: Skin is warm and dry.  Neurological:     General: No focal deficit present.     Mental Status: She is alert.     Vitals:   09/30/18 1331  BP: 126/78  Pulse: 75  Temp: 97.9 F (36.6 C)  SpO2: 100%   Results of the Epworth flowsheet 09/30/2018  Sitting and reading 1  Watching TV 1  Sitting, inactive in a public place (e.g. a theatre or a meeting) 1  As a passenger in a car for an hour without a break 2  Lying down to rest in the afternoon when circumstances permit 2  Sitting and talking to someone 0  Sitting quietly after a lunch without alcohol 1  In a car, while stopped for a few minutes in traffic 0  Total score 8       Assessment:     History of snoring  Excessive daytime sleepiness  Witnessed apneas  Moderate probability of significant sleep disordered breathing  Plan:     Pathophysiology of sleep disordered breathing discussed with the patient  Treatment options discussed with the patient  Importance of weight loss and weight maintenance discussed with the patient  We will schedule the patient for home sleep study   We will see him back in the office in about 3 months Encouraged to call with any significant concerns

## 2018-09-30 NOTE — Patient Instructions (Signed)
History of snoring, witnessed apneas  Moderate probability of significant sleep disordered breathing  We will set you up with a home sleep study Treatment options as discussed during the visit  We will see you back in the office in 3 months Encourage increase physical activity as tolerated  Call with any significant concerns

## 2018-10-01 ENCOUNTER — Ambulatory Visit: Payer: Medicare Other

## 2018-10-01 DIAGNOSIS — R0683 Snoring: Secondary | ICD-10-CM

## 2018-10-01 DIAGNOSIS — G4733 Obstructive sleep apnea (adult) (pediatric): Secondary | ICD-10-CM

## 2018-10-01 DIAGNOSIS — R5383 Other fatigue: Secondary | ICD-10-CM

## 2018-10-02 DIAGNOSIS — G4733 Obstructive sleep apnea (adult) (pediatric): Secondary | ICD-10-CM

## 2018-10-03 ENCOUNTER — Ambulatory Visit: Payer: Medicare Other | Admitting: Family Medicine

## 2018-10-03 ENCOUNTER — Telehealth: Payer: Self-pay

## 2018-10-03 DIAGNOSIS — G4733 Obstructive sleep apnea (adult) (pediatric): Secondary | ICD-10-CM

## 2018-10-03 NOTE — Telephone Encounter (Signed)
Home sleep test recommendations per Dr. Ander Slade:   Recommend CPAP therapy for mild obstructive sleep apnea with significant daytime symptoms Auto titrating CPAP with pressure settings of 5-15 will be appropriate Close clinical follow up with compliance monitoring to optimize therapeutic efficiency Weight loss measures encouraged She should be cautioned against driving when sleepy and against medications with sedative effects  Contacted patient by phone to advise of recommendations.  Patient acknowledged understanding and agrees to proceed with CPAP therapy with [facility].  Recall placed for follow up early October 2020 and reminded patient to make note to call mid Sept to make appointment.  Nothing further needed today.  Order placed.

## 2018-10-06 ENCOUNTER — Other Ambulatory Visit: Payer: Self-pay

## 2018-10-06 ENCOUNTER — Other Ambulatory Visit (INDEPENDENT_AMBULATORY_CARE_PROVIDER_SITE_OTHER): Payer: Medicare Other

## 2018-10-06 DIAGNOSIS — I10 Essential (primary) hypertension: Secondary | ICD-10-CM

## 2018-10-06 LAB — COMPREHENSIVE METABOLIC PANEL
ALT: 15 U/L (ref 0–35)
AST: 15 U/L (ref 0–37)
Albumin: 4.1 g/dL (ref 3.5–5.2)
Alkaline Phosphatase: 55 U/L (ref 39–117)
BUN: 14 mg/dL (ref 6–23)
CO2: 24 mEq/L (ref 19–32)
Calcium: 9 mg/dL (ref 8.4–10.5)
Chloride: 105 mEq/L (ref 96–112)
Creatinine, Ser: 0.75 mg/dL (ref 0.40–1.20)
GFR: 76.84 mL/min (ref >60.00)
Glucose, Bld: 85 mg/dL (ref 70–99)
Potassium: 4.3 mEq/L (ref 3.5–5.1)
Sodium: 139 mEq/L (ref 135–145)
Total Bilirubin: 0.4 mg/dL (ref 0.2–1.2)
Total Protein: 6.8 g/dL (ref 6.0–8.3)

## 2018-10-07 NOTE — Telephone Encounter (Signed)
Can you please send the pt's CPAP order to [facility] per her initial request? Thanks!

## 2018-10-09 ENCOUNTER — Encounter: Payer: Self-pay | Admitting: Family Medicine

## 2018-10-09 ENCOUNTER — Ambulatory Visit (INDEPENDENT_AMBULATORY_CARE_PROVIDER_SITE_OTHER): Payer: Medicare Other | Admitting: Family Medicine

## 2018-10-09 ENCOUNTER — Other Ambulatory Visit: Payer: Self-pay

## 2018-10-09 DIAGNOSIS — M65341 Trigger finger, right ring finger: Secondary | ICD-10-CM

## 2018-10-09 DIAGNOSIS — R519 Headache, unspecified: Secondary | ICD-10-CM

## 2018-10-09 DIAGNOSIS — R51 Headache: Secondary | ICD-10-CM

## 2018-10-09 DIAGNOSIS — G4730 Sleep apnea, unspecified: Secondary | ICD-10-CM

## 2018-10-09 DIAGNOSIS — R739 Hyperglycemia, unspecified: Secondary | ICD-10-CM

## 2018-10-09 DIAGNOSIS — I10 Essential (primary) hypertension: Secondary | ICD-10-CM

## 2018-10-09 MED ORDER — LISINOPRIL 20 MG PO TABS: 20.0000 mg | ORAL_TABLET | Freq: Two times a day (BID) | ORAL | 2 refills | Status: DC

## 2018-10-09 MED FILL — LISINOPRIL 20 MG TABLET: 20 | 30 days supply | Qty: 60 | Fill #0

## 2018-10-12 DIAGNOSIS — M6530 Trigger finger, unspecified finger: Secondary | ICD-10-CM | POA: Insufficient documentation

## 2018-10-12 NOTE — Assessment & Plan Note (Signed)
Now mild and less frequent. Encouraged increased hydration, 64 ounces of clear fluids daily. Minimize alcohol and caffeine. Eat small frequent meals with lean proteins and complex carbs. Avoid high and low blood sugars. Get adequate sleep, 7-8 hours a night. Needs exercise daily preferably in the morning.

## 2018-10-12 NOTE — Assessment & Plan Note (Signed)
Still not fully controlled. Has tolerated the increase in Lisinopril will increase to 20 mg po bid and check cmp in 2-4 weeks. Have her monitor bp and reevaluate. Report an y concerns

## 2018-10-12 NOTE — Assessment & Plan Note (Signed)
Has had surgery on left in past with good results and now developing symptoms on the right, not ready for referral yet encouraged to keep hand moving and try to massage area with Lidcoaine gel bid.

## 2018-10-12 NOTE — Progress Notes (Signed)
Virtual Visit via Video Note  I connected with Carrie Elliott on 10/09/18 at 10:40 AM EDT by a video enabled telemedicine application and verified that I am speaking with the correct person using two identifiers.  Location: Patient: home Provider: home   I discussed the limitations of evaluation and management by telemedicine and the availability of in person appointments. The patient expressed understanding and agreed to proceed. [name] CMA was able to get the patient set up on a video visit.     Subjective:    Patient ID: Carrie Elliott, female    DOB: 28-Jan-1951, 68 y.o.   MRN: [id number]  No chief complaint on file.   HPI Patient is in today for follow up on hypertension, hyperglycemia, sleep apnea and more. She has had a home sleep study which has confirmed mild sleep apnea and they are going to try CPAP. Her headaches are less frequent and milder but still occurring. No associated symptoms. Is tolerating increase in Lisinopril. No new cough acknowledged. Has been using Tylenol roughly twice a week for HA with good results. Is noting pain and contracture in right had as contractures develop. Denies CP/palp/SOB/HA/congestion/fevers/GI or GU c/o. Taking meds as prescribed  Past Medical History:  Diagnosis Date  . Allergic rhinitis   . Allergy   . Anemia 08/24/2014  . Arthritis    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, shoulders, knees  . Benign paroxysmal positional vertigo 07/12/2013  . Chicken pox   . Cystocele    grade 2  . Endometriosis    with Menometrorhaghia   . Gastric polyps 09/30/2015  . Gastroesophageal reflux disease with hiatal hernia    Cough Sees Dr Laretta Bolster   . GERD (gastroesophageal reflux disease)   . Hemorrhoids   . History of colonic polyps   . History of colonic polyps 09/30/2015   adenoma  . HTN (hypertension) 04/23/2013  . Hypocalcemia 09/30/2015  . Hyponatremia 11/23/2016  . IBS (irritable bowel syndrome)   . Measles   . Mumps   . Muscle spasm 09/30/2015  . Osteopenia  10/09/2015  . Rectocele    grade 2  . Reflux    cough  . Rosacea   . Sternal fracture    1986  . Sun-damaged skin 09/05/2014  . Vertigo 11/23/2016  . Vitamin D deficiency 04/06/2016  . 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 APPENDECTOMY  1989  . atypical nevus excision    . BREAST BIOPSY Right   . CHOLECYSTECTOMY  1989  . COLONOSCOPY  2011  . COLONOSCOPY WITH PROPOFOL N/A 05/27/2015   Procedure: COLONOSCOPY WITH PROPOFOL;  Surgeon: Clarene Essex, MD;  Location: [facility];  Service: Endoscopy;  Laterality: N/A;  . CYSTOSCOPY W/ URETERAL STENT REMOVAL  1999  . ESOPHAGOGASTRODUODENOSCOPY  2011   with esophageal stent, perforation   . ESOPHAGOGASTRODUODENOSCOPY (EGD) WITH PROPOFOL N/A 05/27/2015   Procedure: ESOPHAGOGASTRODUODENOSCOPY (EGD) WITH PROPOFOL;  Surgeon: Clarene Essex, MD;  Location: [facility];  Service: Endoscopy;  Laterality: N/A;  . HEMORRHOID SURGERY  2005  . LEFT OOPHORECTOMY  3825   complicated by ureteral injuery requiring a ureteral stent   . MMK / ANTERIOR VESICOURETHROPEXY / URETHROPEXY  2000  . TONSILLECTOMY AND ADENOIDECTOMY    . TOTAL ABDOMINAL HYSTERECTOMY  2000  . TUBAL LIGATION  1981  . wisdom teeth removal  1964    Family History  Problem Relation Age of Onset  . Colon polyps Father   . Hypertension  Father   . Heart failure Father   . Melanoma Father        multiple  . Heart attack Father        x2  . Heart disease Father        Bundle Block  . Meniere's disease Father   . Cancer Father        melanoma  . Osteoporosis Mother   . Transient ischemic attack Mother   . Cancer Mother        colon  . Heart disease Mother        afib  . Hyperlipidemia Mother   . Diabetes Paternal Grandmother   . Stroke Maternal Grandmother   . Cancer Maternal Grandmother        skin, melanoma, sarcoma  . Colon polyps Maternal Grandmother   . Diverticulitis Maternal Grandmother   . Stroke Maternal Grandfather   .  Heart disease Maternal Grandfather   . Obesity Brother     Social History   Socioeconomic History  . Marital status: Married    Spouse name: Not on file  . Number of children: Not on file  . Years of education: Not on file  . Highest education level: Not on file  Occupational History  . Not on file  Social Needs  . Financial resource strain: Not on file  . Food insecurity    Worry: Not on file    Inability: Not on file  . Transportation needs    Medical: Not on file    Non-medical: Not on file  Tobacco Use  . Smoking status: Never Smoker  . Smokeless tobacco: Never Used  Substance and Sexual Activity  . Alcohol use: No  . Drug use: No  . 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 Physical activity    Days per week: Not on file    Minutes per session: Not on file  . Stress: Not on file  Relationships  . Social Herbalist on phone: Not on file    Gets together: Not on file    Attends religious service: Not on file    Active member of club or organization: Not on file    Attends meetings of clubs or organizations: Not on file    Relationship status: Not on file  . Intimate partner violence    Fear of current or ex partner: Not on file    Emotionally abused: Not on file    Physically abused: Not on file    Forced sexual activity: Not on file  Other Topics Concern  . Not on file  Social History Narrative  . Not on file    Outpatient Medications Prior to Visit  Medication Sig Dispense Refill  . acetaminophen (TYLENOL) 500 MG tablet Take 1,000 mg by mouth every 6 (six) hours as needed for headache (pain).    . benzocaine (ORAJEL) 10 % mucosal gel Use as directed 1 application in the mouth or throat as needed for mouth pain. Reported on 07/22/2015    . esomeprazole (NEXIUM) 40 MG capsule   3  . famotidine (PEPCID) 20 MG tablet     . fexofenadine (ALLEGRA) 180 MG tablet Take 180 mg by  mouth as needed.     . Lidocaine-Hydrocortisone Ace 3-0.5 % CREA Apply 1 application topically daily as needed (hemmoriods.).     Marland Kitchen meclizine (ANTIVERT) 25 MG tablet Take 1 tablet (  25 mg total) by mouth 3 (three) times daily as needed for dizziness. 30 tablet 2  . metoprolol succinate (TOPROL-XL) 50 MG 24 hr tablet Take 1-2 tablets (50-100 mg total) by mouth 2 (two) times daily. Take with or immediately following a meal. 360 tablet 0  . mometasone (NASONEX) 50 MCG/ACT nasal spray Place 1-2 sprays into the nose daily as needed. Reported on 07/22/2015 17 g 5  . ondansetron (ZOFRAN) 8 MG tablet Take 1 tablet (8 mg total) by mouth every 8 (eight) hours as needed for nausea or vomiting. 90 tablet 1  . SIMPLY SALINE NA Place 1 each into the nose daily.     Marland Kitchen lisinopril (ZESTRIL) 10 MG tablet Take 1 tablet (10 mg total) by mouth 2 (two) times daily. 60 tablet 1   No facility-administered medications prior to visit.     Allergies  Allergen Reactions  . Cefdinir     HIVES  . Celebrex [Celecoxib]     ABDOMINAL PAIN   . Ciprofloxacin     ABDOMINAL PAIN   . Codeine     NAUSEA VOMITING - can take with zofran  . Pseudoephedrine Hcl Er     TACHYCARDIA   . Singulair [Montelukast Sodium] Other (See Comments)    Mental status changes, irritable    Review of Systems  Constitutional: Positive for malaise/fatigue. Negative for fever.  HENT: Negative for congestion.   Eyes: Negative for blurred vision.  Respiratory: Negative for shortness of breath.   Cardiovascular: Negative for chest pain, palpitations and leg swelling.  Gastrointestinal: Negative for abdominal pain, blood in stool and nausea.  Genitourinary: Negative for dysuria and frequency.  Musculoskeletal: Positive for joint pain. Negative for falls.  Skin: Negative for rash.  Neurological: Positive for headaches. Negative for dizziness and loss of consciousness.  Endo/Heme/Allergies: Negative for environmental allergies.   Psychiatric/Behavioral: Negative for depression. The patient is not nervous/anxious.        Objective:    Physical Exam Constitutional:      Appearance: Normal appearance. She is not ill-appearing.  HENT:     Head: Normocephalic and atraumatic.  Eyes:     General:        Right eye: No discharge.        Left eye: No discharge.  Pulmonary:     Effort: Pulmonary effort is normal.  Neurological:     Mental Status: She is alert and oriented to person, place, and time.  Psychiatric:        Mood and Affect: Mood normal.        Behavior: Behavior normal.     There were no vitals taken for this visit. Wt Readings from Last 3 Encounters:  09/30/18 168 lb 3.2 oz (76.3 kg)  09/03/18 163 lb (73.9 kg)  05/23/18 161 lb 12.8 oz (73.4 kg)    Diabetic Foot Exam - Simple   No data filed     Lab Results  Component Value Date   WBC 5.1 05/23/2018   HGB 11.6 (L) 05/23/2018   HCT 36.0 05/23/2018   PLT 262.0 05/23/2018   GLUCOSE 85 10/06/2018   CHOL 179 02/18/2018   TRIG 208.0 (H) 02/18/2018   HDL 54.40 02/18/2018   LDLDIRECT 106.0 02/18/2018   LDLCALC 92 09/13/2017   ALT 15 10/06/2018   AST 15 10/06/2018   NA 139 10/06/2018   K 4.3 10/06/2018   CL 105 10/06/2018   CREATININE 0.75 10/06/2018   BUN 14 10/06/2018   CO2 24 10/06/2018  TSH 0.82 02/18/2018   INR 1.19 02/07/2010   HGBA1C 6.0 02/18/2018    Lab Results  Component Value Date   TSH 0.82 02/18/2018   Lab Results  Component Value Date   WBC 5.1 05/23/2018   HGB 11.6 (L) 05/23/2018   HCT 36.0 05/23/2018   MCV 81.8 05/23/2018   PLT 262.0 05/23/2018   Lab Results  Component Value Date   NA 139 10/06/2018   K 4.3 10/06/2018   CO2 24 10/06/2018   GLUCOSE 85 10/06/2018   BUN 14 10/06/2018   CREATININE 0.75 10/06/2018   BILITOT 0.4 10/06/2018   ALKPHOS 55 10/06/2018   AST 15 10/06/2018   ALT 15 10/06/2018   PROT 6.8 10/06/2018   ALBUMIN 4.1 10/06/2018   CALCIUM 9.0 10/06/2018   ANIONGAP 8 05/31/2016    GFR 76.84 10/06/2018   Lab Results  Component Value Date   CHOL 179 02/18/2018   Lab Results  Component Value Date   HDL 54.40 02/18/2018   Lab Results  Component Value Date   LDLCALC 92 09/13/2017   Lab Results  Component Value Date   TRIG 208.0 (H) 02/18/2018   Lab Results  Component Value Date   CHOLHDL 3 02/18/2018   Lab Results  Component Value Date   HGBA1C 6.0 02/18/2018       Assessment & Plan:   Problem List Items Addressed This Visit    HTN (hypertension) - Primary (Chronic)    Still not fully controlled. Has tolerated the increase in Lisinopril will increase to 20 mg po bid and check cmp in 2-4 weeks. Have her monitor bp and reevaluate. Report an y concerns      Relevant Medications   lisinopril (ZESTRIL) 20 MG tablet   Other Relevant Orders   Comprehensive metabolic panel   Hyperglycemia    minimize simple carbs. Increase exercise as tolerated      Sleep apnea    Home study has confirmed mild apnea and they have decided to try CPAP to help comorbidities      Trigger finger, right    Has had surgery on left in past with good results and now developing symptoms on the right, not ready for referral yet encouraged to keep hand moving and try to massage area with Lidcoaine gel bid.         I have discontinued Carrie Elliott's lisinopril. I am also having her start on lisinopril. Additionally, I am having her maintain her fexofenadine, acetaminophen, benzocaine, Lidocaine-Hydrocortisone Ace, SIMPLY SALINE NA, meclizine, ondansetron, mometasone, famotidine, esomeprazole, and metoprolol succinate.  Meds ordered this encounter  Medications  . lisinopril (ZESTRIL) 20 MG tablet    Sig: Take 1 tablet (20 mg total) by mouth 2 (two) times a day.    Dispense:  60 tablet    Refill:  2     I discussed the assessment and treatment plan with the patient. The patient was provided an opportunity to ask questions and all were answered. The patient agreed with  the plan and demonstrated an understanding of the instructions.   The patient was advised to call back or seek an in-person evaluation if the symptoms worsen or if the condition fails to improve as anticipated.  I provided 15 minutes of non-face-to-face time during this encounter.   Penni Homans, MD

## 2018-10-12 NOTE — Assessment & Plan Note (Signed)
minimize simple carbs. Increase exercise as tolerated.  

## 2018-10-12 NOTE — Assessment & Plan Note (Signed)
Home study has confirmed mild apnea and they have decided to try CPAP to help comorbidities

## 2018-10-20 MED FILL — ESOMEPRAZOLE MAG DR 40 MG C: 40 | 30 days supply | Qty: 30 | Fill #3

## 2018-10-24 MED FILL — FAMOTIDINE 20 MG TABLET: 20 | 30 days supply | Qty: 60 | Fill #3

## 2018-10-30 ENCOUNTER — Other Ambulatory Visit: Payer: Medicare Other

## 2018-11-05 DIAGNOSIS — H2513 Age-related nuclear cataract, bilateral: Secondary | ICD-10-CM | POA: Diagnosis not present

## 2018-11-05 DIAGNOSIS — H52203 Unspecified astigmatism, bilateral: Secondary | ICD-10-CM | POA: Diagnosis not present

## 2018-11-06 ENCOUNTER — Other Ambulatory Visit: Payer: Self-pay

## 2018-11-06 ENCOUNTER — Other Ambulatory Visit (INDEPENDENT_AMBULATORY_CARE_PROVIDER_SITE_OTHER): Payer: Medicare Other

## 2018-11-06 DIAGNOSIS — I10 Essential (primary) hypertension: Secondary | ICD-10-CM

## 2018-11-06 LAB — COMPREHENSIVE METABOLIC PANEL
ALT: 15 U/L (ref 0–35)
AST: 15 U/L (ref 0–37)
Albumin: 3.9 g/dL (ref 3.5–5.2)
Alkaline Phosphatase: 56 U/L (ref 39–117)
BUN: 11 mg/dL (ref 6–23)
CO2: 26 mEq/L (ref 19–32)
Calcium: 9.2 mg/dL (ref 8.4–10.5)
Chloride: 103 mEq/L (ref 96–112)
Creatinine, Ser: 0.74 mg/dL (ref 0.40–1.20)
GFR: 78.02 mL/min (ref >60.00)
Glucose, Bld: 85 mg/dL (ref 70–99)
Potassium: 4.2 mEq/L (ref 3.5–5.1)
Sodium: 137 mEq/L (ref 135–145)
Total Bilirubin: 0.4 mg/dL (ref 0.2–1.2)
Total Protein: 6.6 g/dL (ref 6.0–8.3)

## 2018-11-10 ENCOUNTER — Telehealth: Payer: Self-pay | Admitting: *Deleted

## 2018-11-10 ENCOUNTER — Encounter: Payer: Self-pay | Admitting: Family Medicine

## 2018-11-10 ENCOUNTER — Other Ambulatory Visit: Payer: Self-pay

## 2018-11-10 ENCOUNTER — Ambulatory Visit (INDEPENDENT_AMBULATORY_CARE_PROVIDER_SITE_OTHER): Payer: Medicare Other | Admitting: Family Medicine

## 2018-11-10 VITALS — BP 112/76 | HR 65 | Temp 99.5°F

## 2018-11-10 DIAGNOSIS — E538 Deficiency of other specified B group vitamins: Secondary | ICD-10-CM

## 2018-11-10 DIAGNOSIS — K589 Irritable bowel syndrome without diarrhea: Secondary | ICD-10-CM | POA: Diagnosis not present

## 2018-11-10 DIAGNOSIS — R739 Hyperglycemia, unspecified: Secondary | ICD-10-CM | POA: Diagnosis not present

## 2018-11-10 DIAGNOSIS — E559 Vitamin D deficiency, unspecified: Secondary | ICD-10-CM

## 2018-11-10 DIAGNOSIS — I10 Essential (primary) hypertension: Secondary | ICD-10-CM | POA: Diagnosis not present

## 2018-11-10 DIAGNOSIS — E785 Hyperlipidemia, unspecified: Secondary | ICD-10-CM

## 2018-11-10 MED ORDER — LEVOFLOXACIN 500 MG PO TABS: 500.0000 mg | ORAL_TABLET | Freq: Every day | ORAL | 0 refills | Status: DC

## 2018-11-10 MED ORDER — METRONIDAZOLE 500 MG PO TABS: 500.0000 mg | ORAL_TABLET | Freq: Three times a day (TID) | ORAL | 0 refills | Status: DC

## 2018-11-10 MED ORDER — HYOSCYAMINE SULFATE 0.125 MG SL SUBL: 0.1250 mg | SUBLINGUAL_TABLET | SUBLINGUAL | 0 refills | Status: DC | PRN

## 2018-11-10 MED FILL — levoFLOXacin 500 MG TABS: 500 | 7 days supply | Qty: 7 | Fill #0

## 2018-11-10 MED FILL — METRONIDAZOLE 500 MG TABS: 500 | 7 days supply | Qty: 21 | Fill #0

## 2018-11-10 MED FILL — OSCIMIN SL 0.125 MG TABLET: 0.125 | 5 days supply | Qty: 30 | Fill #0

## 2018-11-10 NOTE — Telephone Encounter (Signed)
Patient needs lab appt late September and appt with [facility] a week or two afterwards.

## 2018-11-11 MED FILL — LISINOPRIL 20 MG TABLET: 20 | 30 days supply | Qty: 60 | Fill #1

## 2018-11-11 MED FILL — ESOMEPRAZOLE MAG DR 40 MG C: 40 | 30 days supply | Qty: 30 | Fill #4

## 2018-11-11 NOTE — Assessment & Plan Note (Signed)
Well controlled, no changes to meds. Encouraged heart healthy diet such as the DASH diet and exercise as tolerated.  °

## 2018-11-12 NOTE — Assessment & Plan Note (Signed)
Supplement and monitor 

## 2018-11-12 NOTE — Progress Notes (Signed)
Virtual Visit via Video Note  I connected with Kaeleigh Westendorf Blando on 11/12/18 at  3:20 PM EDT by a video enabled telemedicine application and verified that I am speaking with the correct person using two identifiers.  Location: Patient: home Provider: office   I discussed the limitations of evaluation and management by telemedicine and the availability of in person appointments. The patient expressed understanding and agreed to proceed. Magdalene Molly, CMA was able to get the patient set  Up on virtual, video   Subjective:    Patient ID: Carrie Elliott, female    DOB: [birth year], 68 y.o.   MRN: [id number]  Chief Complaint  Patient presents with  . Hypertension    HPI Patient is in today for follow up on chronic medical concerns including hypertension, hyperglycemia and more. Her new concern is some roving abdominal pain and discomfort. She reports more gas and notes the majority of the time the pain is in the right lower quadrant. She has a history of appendectomy and cholecystectomy. No fevers or chills. No polyuria or polydipsia. No recent hospitalizations. Denies CP/palp/SOB/HA/congestion/fevers or GU c/o. Taking meds as prescribed  Past Medical History:  Diagnosis Date  . Allergic rhinitis   . Allergy   . Anemia 08/24/2014  . Arthritis    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, shoulders, knees  . Benign paroxysmal positional vertigo 07/12/2013  . Chicken pox   . Cystocele    grade 2  . Endometriosis    with Menometrorhaghia   . Gastric polyps 09/30/2015  . Gastroesophageal reflux disease with hiatal hernia    Cough Sees Dr Laretta Bolster   . GERD (gastroesophageal reflux disease)   . Hemorrhoids   . History of colonic polyps   . History of colonic polyps 09/30/2015   adenoma  . HTN (hypertension) 04/23/2013  . Hypocalcemia 09/30/2015  . Hyponatremia 11/23/2016  . IBS (irritable bowel syndrome)   . Measles   . Mumps   . Muscle spasm 09/30/2015  . Osteopenia 10/09/2015  . Rectocele    grade 2  . Reflux    cough  . Rosacea   . Sternal fracture    1986  . Sun-damaged skin 09/05/2014  . Vertigo 11/23/2016  . Vitamin D deficiency 04/06/2016  . 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 APPENDECTOMY  1989  . atypical nevus excision    . BREAST BIOPSY Right   . CHOLECYSTECTOMY  1989  . COLONOSCOPY  2011  . COLONOSCOPY WITH PROPOFOL N/A 05/27/2015   Procedure: COLONOSCOPY WITH PROPOFOL;  Surgeon: Clarene Essex, MD;  Location: [facility];  Service: Endoscopy;  Laterality: N/A;  . CYSTOSCOPY W/ URETERAL STENT REMOVAL  1999  . ESOPHAGOGASTRODUODENOSCOPY  2011   with esophageal stent, perforation   . ESOPHAGOGASTRODUODENOSCOPY (EGD) WITH PROPOFOL N/A 05/27/2015   Procedure: ESOPHAGOGASTRODUODENOSCOPY (EGD) WITH PROPOFOL;  Surgeon: Clarene Essex, MD;  Location: [facility];  Service: Endoscopy;  Laterality: N/A;  . HEMORRHOID SURGERY  2005  . LEFT OOPHORECTOMY  9381   complicated by ureteral injuery requiring a ureteral stent   . MMK / ANTERIOR VESICOURETHROPEXY / URETHROPEXY  2000  . TONSILLECTOMY AND ADENOIDECTOMY    . TOTAL ABDOMINAL HYSTERECTOMY  2000  . TUBAL LIGATION  1981  . wisdom teeth removal  1964    Family History  Problem Relation Age of Onset  . Colon polyps Father   . Hypertension Father   . Heart failure Father   . Melanoma Father  multiple  . Heart attack Father        x2  . Heart disease Father        Bundle Block  . Meniere's disease Father   . Cancer Father        melanoma  . Osteoporosis Mother   . Transient ischemic attack Mother   . Cancer Mother        colon  . Heart disease Mother        afib  . Hyperlipidemia Mother   . Diabetes Paternal Grandmother   . Stroke Maternal Grandmother   . Cancer Maternal Grandmother        skin, melanoma, sarcoma  . Colon polyps Maternal Grandmother   . Diverticulitis Maternal Grandmother   . Stroke Maternal Grandfather   . Heart disease Maternal Grandfather   . Obesity Brother      Social History   Socioeconomic History  . Marital status: Married    Spouse name: Not on file  . Number of children: Not on file  . Years of education: Not on file  . Highest education level: Not on file  Occupational History  . Not on file  Social Needs  . Financial resource strain: Not on file  . Food insecurity    Worry: Not on file    Inability: Not on file  . Transportation needs    Medical: Not on file    Non-medical: Not on file  Tobacco Use  . Smoking status: Never Smoker  . Smokeless tobacco: Never Used  Substance and Sexual Activity  . Alcohol use: No  . Drug use: No  . 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 Physical activity    Days per week: Not on file    Minutes per session: Not on file  . Stress: Not on file  Relationships  . Social Herbalist on phone: Not on file    Gets together: Not on file    Attends religious service: Not on file    Active member of club or organization: Not on file    Attends meetings of clubs or organizations: Not on file    Relationship status: Not on file  . Intimate partner violence    Fear of current or ex partner: Not on file    Emotionally abused: Not on file    Physically abused: Not on file    Forced sexual activity: Not on file  Other Topics Concern  . Not on file  Social History Narrative  . Not on file    Outpatient Medications Prior to Visit  Medication Sig Dispense Refill  . acetaminophen (TYLENOL) 500 MG tablet Take 1,000 mg by mouth every 6 (six) hours as needed for headache (pain).    . benzocaine (ORAJEL) 10 % mucosal gel Use as directed 1 application in the mouth or throat as needed for mouth pain. Reported on 07/22/2015    . esomeprazole (NEXIUM) 40 MG capsule   3  . famotidine (PEPCID) 20 MG tablet     . fexofenadine (ALLEGRA) 180 MG tablet Take 180 mg by mouth as needed.     . Lidocaine-Hydrocortisone Ace 3-0.5  % CREA Apply 1 application topically daily as needed (hemmoriods.).     Marland Kitchen lisinopril (ZESTRIL) 20 MG tablet Take 1 tablet (20 mg total) by mouth 2 (two) times a day. 60 tablet 2  . meclizine (ANTIVERT) 25 MG  tablet Take 1 tablet (25 mg total) by mouth 3 (three) times daily as needed for dizziness. 30 tablet 2  . metoprolol succinate (TOPROL-XL) 50 MG 24 hr tablet Take 1-2 tablets (50-100 mg total) by mouth 2 (two) times daily. Take with or immediately following a meal. 360 tablet 0  . mometasone (NASONEX) 50 MCG/ACT nasal spray Place 1-2 sprays into the nose daily as needed. Reported on 07/22/2015 17 g 5  . ondansetron (ZOFRAN) 8 MG tablet Take 1 tablet (8 mg total) by mouth every 8 (eight) hours as needed for nausea or vomiting. 90 tablet 1  . SIMPLY SALINE NA Place 1 each into the nose daily.      No facility-administered medications prior to visit.     Allergies  Allergen Reactions  . Cefdinir     HIVES  . Celebrex [Celecoxib]     ABDOMINAL PAIN   . Ciprofloxacin     ABDOMINAL PAIN   . Codeine     NAUSEA VOMITING - can take with zofran  . Pseudoephedrine Hcl Er     TACHYCARDIA   . Singulair [Montelukast Sodium] Other (See Comments)    Mental status changes, irritable    Review of Systems  Constitutional: Positive for malaise/fatigue. Negative for fever.  HENT: Negative for congestion.   Eyes: Negative for blurred vision.  Respiratory: Negative for shortness of breath.   Cardiovascular: Negative for chest pain, palpitations and leg swelling.  Gastrointestinal: Positive for abdominal pain. Negative for blood in stool and nausea.  Genitourinary: Negative for dysuria and frequency.  Musculoskeletal: Negative for falls.  Skin: Negative for rash.  Neurological: Negative for dizziness, loss of consciousness and headaches.  Endo/Heme/Allergies: Negative for environmental allergies.  Psychiatric/Behavioral: Negative for depression. The patient is not nervous/anxious.         Objective:    Physical Exam Constitutional:      General: She is not in acute distress.    Appearance: Normal appearance. She is well-developed. She is not ill-appearing.  HENT:     Head: Normocephalic and atraumatic.     Nose: Nose normal.  Eyes:     General:        Right eye: No discharge.        Left eye: No discharge.  Cardiovascular:     Heart sounds: No murmur.  Pulmonary:     Effort: Pulmonary effort is normal.  Neurological:     Mental Status: She is alert and oriented to person, place, and time.  Psychiatric:        Mood and Affect: Mood normal.        Behavior: Behavior normal.     BP 112/76   Pulse 65   Temp 99.5 F (37.5 C) (Oral)   SpO2 98%  Wt Readings from Last 3 Encounters:  09/30/18 168 lb 3.2 oz (76.3 kg)  09/03/18 163 lb (73.9 kg)  05/23/18 161 lb 12.8 oz (73.4 kg)    Diabetic Foot Exam - Simple   No data filed     Lab Results  Component Value Date   WBC 5.1 05/23/2018   HGB 11.6 (L) 05/23/2018   HCT 36.0 05/23/2018   PLT 262.0 05/23/2018   GLUCOSE 85 11/06/2018   CHOL 179 02/18/2018   TRIG 208.0 (H) 02/18/2018   HDL 54.40 02/18/2018   LDLDIRECT 106.0 02/18/2018   LDLCALC 92 09/13/2017   ALT 15 11/06/2018   AST 15 11/06/2018   NA 137 11/06/2018   K 4.2 11/06/2018  CL 103 11/06/2018   CREATININE 0.74 11/06/2018   BUN 11 11/06/2018   CO2 26 11/06/2018   TSH 0.82 02/18/2018   INR 1.19 02/07/2010   HGBA1C 6.0 02/18/2018    Lab Results  Component Value Date   TSH 0.82 02/18/2018   Lab Results  Component Value Date   WBC 5.1 05/23/2018   HGB 11.6 (L) 05/23/2018   HCT 36.0 05/23/2018   MCV 81.8 05/23/2018   PLT 262.0 05/23/2018   Lab Results  Component Value Date   NA 137 11/06/2018   K 4.2 11/06/2018   CO2 26 11/06/2018   GLUCOSE 85 11/06/2018   BUN 11 11/06/2018   CREATININE 0.74 11/06/2018   BILITOT 0.4 11/06/2018   ALKPHOS 56 11/06/2018   AST 15 11/06/2018   ALT 15 11/06/2018   PROT 6.6 11/06/2018   ALBUMIN  3.9 11/06/2018   CALCIUM 9.2 11/06/2018   ANIONGAP 8 05/31/2016   GFR 78.02 11/06/2018   Lab Results  Component Value Date   CHOL 179 02/18/2018   Lab Results  Component Value Date   HDL 54.40 02/18/2018   Lab Results  Component Value Date   LDLCALC 92 09/13/2017   Lab Results  Component Value Date   TRIG 208.0 (H) 02/18/2018   Lab Results  Component Value Date   CHOLHDL 3 02/18/2018   Lab Results  Component Value Date   HGBA1C 6.0 02/18/2018       Assessment & Plan:   Problem List Items Addressed This Visit    HTN (hypertension) - Primary (Chronic)    Well controlled, no changes to meds. Encouraged heart healthy diet such as the DASH diet and exercise as tolerated.       Relevant Orders   CBC   Comprehensive metabolic panel   TSH   IBS (irritable bowel syndrome)    Worsening lower abdominal pain over the last few days consider diverticulitis. Is given rx for Flagyl and Levaquin to use prn if symptoms worsen and or do not improve      Relevant Medications   hyoscyamine (LEVSIN SL) 0.125 MG SL tablet   Vitamin D deficiency    Supplement and monitor      Relevant Orders   VITAMIN D 25 Hydroxy (Vit-D Deficiency, Fractures)   Hyperlipidemia    Encouraged heart healthy diet, increase exercise, avoid trans fats and carbs, simple      Relevant Orders   CBC   Lipid panel   Hyperglycemia    hgba1c acceptable, minimize simple carbs. Increase exercise as tolerated. Continue current meds      Relevant Orders   Hemoglobin A1c   Vitamin B12 deficiency    Supplement and monitor      Relevant Orders   Vitamin B12      I am having Christean Grief. Brun start on hyoscyamine, levofloxacin, and metroNIDAZOLE. I am also having her maintain her fexofenadine, acetaminophen, benzocaine, Lidocaine-Hydrocortisone Ace, SIMPLY SALINE NA, meclizine, ondansetron, mometasone, famotidine, esomeprazole, metoprolol succinate, and lisinopril.  Meds ordered this encounter   Medications  . hyoscyamine (LEVSIN SL) 0.125 MG SL tablet    Sig: Place 1 tablet (0.125 mg total) under the tongue every 4 (four) hours as needed.    Dispense:  30 tablet    Refill:  0  . levofloxacin (LEVAQUIN) 500 MG tablet    Sig: Take 1 tablet (500 mg total) by mouth daily.    Dispense:  7 tablet    Refill:  0  . metroNIDAZOLE (  FLAGYL) 500 MG tablet    Sig: Take 1 tablet (500 mg total) by mouth 3 (three) times daily.    Dispense:  21 tablet    Refill:  0  I discussed the assessment and treatment plan with the patient. The patient was provided an opportunity to ask questions and all were answered. The patient agreed with the plan and demonstrated an understanding of the instructions.   The patient was advised to call back or seek an in-person evaluation if the symptoms worsen or if the condition fails to improve as anticipated.  I provided 25 minutes of non-face-to-face time during this encounter.   Penni Homans, MD

## 2018-11-12 NOTE — Assessment & Plan Note (Signed)
Encouraged heart healthy diet, increase exercise, avoid trans fats and carbs, simple

## 2018-11-12 NOTE — Assessment & Plan Note (Signed)
Worsening lower abdominal pain over the last few days consider diverticulitis. Is given rx for Flagyl and Levaquin to use prn if symptoms worsen and or do not improve

## 2018-11-12 NOTE — Assessment & Plan Note (Signed)
hgba1c acceptable, minimize simple carbs. Increase exercise as tolerated. Continue current meds 

## 2018-11-26 DIAGNOSIS — L821 Other seborrheic keratosis: Secondary | ICD-10-CM | POA: Diagnosis not present

## 2018-11-26 DIAGNOSIS — D485 Neoplasm of uncertain behavior of skin: Secondary | ICD-10-CM | POA: Diagnosis not present

## 2018-11-27 MED FILL — FAMOTIDINE 20 MG TABLET: 20 | 30 days supply | Qty: 60 | Fill #4

## 2018-12-10 ENCOUNTER — Other Ambulatory Visit: Payer: Self-pay | Admitting: Family Medicine

## 2018-12-10 MED FILL — LISINOPRIL 20 MG TABS: 20 | 30 days supply | Qty: 60 | Fill #2

## 2018-12-11 MED FILL — ESOMEPRAZOLE MAG DR 40 MG C: 40 | 30 days supply | Qty: 30 | Fill #5

## 2018-12-11 MED FILL — METOPROLOL SUCCINATE ER 50: 50 | 90 days supply | Qty: 360 | Fill #0

## 2018-12-16 ENCOUNTER — Encounter: Payer: Self-pay | Admitting: Family Medicine

## 2018-12-16 DIAGNOSIS — I10 Essential (primary) hypertension: Secondary | ICD-10-CM

## 2018-12-16 MED ORDER — HYDROCHLOROTHIAZIDE 12.5 MG PO CAPS: 12.5000 mg | ORAL_CAPSULE | Freq: Every day | ORAL | 0 refills | Status: DC

## 2018-12-16 MED FILL — HYDROCHLOROTHIAZIDE 12.5 MG: 12.5 | 30 days supply | Qty: 30 | Fill #0

## 2018-12-16 NOTE — Addendum Note (Signed)
Addended by: Magdalene Molly A on: 12/16/2018 02:15 PM   Modules accepted: Orders

## 2018-12-16 NOTE — Telephone Encounter (Signed)
Spoke with patient I have printed her BP readings and scheduled patient a NV for Bp check this Thursday

## 2018-12-18 ENCOUNTER — Other Ambulatory Visit: Payer: Self-pay

## 2018-12-18 ENCOUNTER — Other Ambulatory Visit (INDEPENDENT_AMBULATORY_CARE_PROVIDER_SITE_OTHER): Payer: Medicare Other

## 2018-12-18 ENCOUNTER — Ambulatory Visit (INDEPENDENT_AMBULATORY_CARE_PROVIDER_SITE_OTHER): Payer: Medicare Other | Admitting: Family Medicine

## 2018-12-18 VITALS — BP 136/82 | HR 70

## 2018-12-18 DIAGNOSIS — R739 Hyperglycemia, unspecified: Secondary | ICD-10-CM

## 2018-12-18 DIAGNOSIS — I10 Essential (primary) hypertension: Secondary | ICD-10-CM

## 2018-12-18 DIAGNOSIS — E785 Hyperlipidemia, unspecified: Secondary | ICD-10-CM | POA: Diagnosis not present

## 2018-12-18 DIAGNOSIS — E559 Vitamin D deficiency, unspecified: Secondary | ICD-10-CM

## 2018-12-18 DIAGNOSIS — E538 Deficiency of other specified B group vitamins: Secondary | ICD-10-CM | POA: Diagnosis not present

## 2018-12-18 LAB — COMPREHENSIVE METABOLIC PANEL
ALT: 14 U/L (ref 0–35)
AST: 15 U/L (ref 0–37)
Albumin: 4.1 g/dL (ref 3.5–5.2)
Alkaline Phosphatase: 59 U/L (ref 39–117)
BUN: 16 mg/dL (ref 6–23)
CO2: 29 mEq/L (ref 19–32)
Calcium: 9.4 mg/dL (ref 8.4–10.5)
Chloride: 101 mEq/L (ref 96–112)
Creatinine, Ser: 0.76 mg/dL (ref 0.40–1.20)
GFR: 75.63 mL/min (ref >60.00)
Glucose, Bld: 88 mg/dL (ref 70–99)
Potassium: 3.9 mEq/L (ref 3.5–5.1)
Sodium: 137 mEq/L (ref 135–145)
Total Bilirubin: 0.4 mg/dL (ref 0.2–1.2)
Total Protein: 6.9 g/dL (ref 6.0–8.3)

## 2018-12-18 LAB — LIPID PANEL
Cholesterol: 207 mg/dL — ABNORMAL HIGH (ref 0–200)
HDL: 57.4 mg/dL (ref >39.00)
LDL Cholesterol: 124 mg/dL — ABNORMAL HIGH (ref 0–99)
NonHDL: 149.2
Total CHOL/HDL Ratio: 4
Triglycerides: 128 mg/dL (ref 0.0–149.0)
VLDL: 25.6 mg/dL (ref 0.0–40.0)

## 2018-12-18 LAB — VITAMIN D 25 HYDROXY (VIT D DEFICIENCY, FRACTURES): VITD: 41.5 ng/mL (ref 30.00–100.00)

## 2018-12-18 LAB — CBC
HCT: 34.5 % — ABNORMAL LOW (ref 36.0–46.0)
Hemoglobin: 11.2 g/dL — ABNORMAL LOW (ref 12.0–15.0)
MCHC: 32.5 g/dL (ref 30.0–36.0)
MCV: 81.8 fl (ref 78.0–100.0)
Platelets: 288 10*3/uL (ref 150.0–400.0)
RBC: 4.22 Mil/uL (ref 3.87–5.11)
RDW: 13.8 % (ref 11.5–15.5)
WBC: 5.5 10*3/uL (ref 4.0–10.5)

## 2018-12-18 LAB — TSH: TSH: 1.2 u[IU]/mL (ref 0.35–4.50)

## 2018-12-18 LAB — VITAMIN B12: Vitamin B-12: 415 pg/mL (ref 211–911)

## 2018-12-18 LAB — HEMOGLOBIN A1C: Hgb A1c MFr Bld: 5.9 % (ref 4.6–6.5)

## 2018-12-18 LAB — MAGNESIUM: Magnesium: 1.8 mg/dL (ref 1.5–2.5)

## 2018-12-18 NOTE — Progress Notes (Signed)
Patient here for blood pressure check per Dr. Charlett Blake.  She had a virtual visit on 11/10/18 and no changes were made for blood pressure.  Patient sent a mychart message on 12/16/18 stating that her blood pressures still are not good and was advised to come in for blood pressure check.  Blood pressures brought in today for review.  Blood pressure this morning at 8am this morning was 140/87 pulse 63.  She states that she has been having a intermittant frontal headache, that she thinks maybe coming from elevated blood pressure.  Has not taken blood pressure medication yet this morning.  Blood pressure today was  120/80  Pulse: 66  Patients blood pressure machine 142/85 Pulse 66  Per Dr. Charlett Blake stay on current dose of medications, do not check blood pressures often, get new machine preferably [employer], and we will see her on 01/01/19.  Advised to keep writing down and bring at next visit.

## 2018-12-19 ENCOUNTER — Other Ambulatory Visit: Payer: Self-pay | Admitting: *Deleted

## 2018-12-19 DIAGNOSIS — D649 Anemia, unspecified: Secondary | ICD-10-CM

## 2018-12-22 ENCOUNTER — Other Ambulatory Visit: Payer: Medicare Other

## 2018-12-22 ENCOUNTER — Other Ambulatory Visit: Payer: Self-pay | Admitting: Emergency Medicine

## 2018-12-22 DIAGNOSIS — D649 Anemia, unspecified: Secondary | ICD-10-CM

## 2018-12-22 DIAGNOSIS — K625 Hemorrhage of anus and rectum: Secondary | ICD-10-CM

## 2018-12-22 LAB — FERRITIN: Ferritin: 5 ng/mL — ABNORMAL LOW (ref 16–288)

## 2018-12-30 ENCOUNTER — Encounter: Payer: Self-pay | Admitting: Family Medicine

## 2018-12-30 ENCOUNTER — Other Ambulatory Visit (INDEPENDENT_AMBULATORY_CARE_PROVIDER_SITE_OTHER): Payer: Medicare Other

## 2018-12-30 DIAGNOSIS — K625 Hemorrhage of anus and rectum: Secondary | ICD-10-CM | POA: Diagnosis not present

## 2018-12-30 LAB — FECAL OCCULT BLOOD, IMMUNOCHEMICAL: Fecal Occult Bld: POSITIVE — AB

## 2018-12-30 NOTE — Telephone Encounter (Signed)
Spoke with patient and she has made an appointment

## 2019-01-01 ENCOUNTER — Other Ambulatory Visit: Payer: Self-pay

## 2019-01-01 ENCOUNTER — Ambulatory Visit (INDEPENDENT_AMBULATORY_CARE_PROVIDER_SITE_OTHER): Payer: Medicare Other | Admitting: Family Medicine

## 2019-01-01 ENCOUNTER — Encounter: Payer: Self-pay | Admitting: Family Medicine

## 2019-01-01 DIAGNOSIS — J3489 Other specified disorders of nose and nasal sinuses: Secondary | ICD-10-CM | POA: Diagnosis not present

## 2019-01-01 DIAGNOSIS — G4730 Sleep apnea, unspecified: Secondary | ICD-10-CM

## 2019-01-01 DIAGNOSIS — L578 Other skin changes due to chronic exposure to nonionizing radiation: Secondary | ICD-10-CM | POA: Diagnosis not present

## 2019-01-01 DIAGNOSIS — R519 Headache, unspecified: Secondary | ICD-10-CM | POA: Diagnosis not present

## 2019-01-01 DIAGNOSIS — I10 Essential (primary) hypertension: Secondary | ICD-10-CM | POA: Diagnosis not present

## 2019-01-01 MED ORDER — LISINOPRIL 20 MG PO TABS: 20.0000 mg | ORAL_TABLET | Freq: Two times a day (BID) | ORAL | 2 refills | Status: DC

## 2019-01-01 MED ORDER — HYDROCHLOROTHIAZIDE 12.5 MG PO CAPS: 12.5000 mg | ORAL_CAPSULE | Freq: Every day | ORAL | 2 refills | Status: DC

## 2019-01-01 MED ORDER — MUPIROCIN 2 % EX OINT: 1.0000 "application " | TOPICAL_OINTMENT | Freq: Two times a day (BID) | CUTANEOUS | 0 refills | Status: DC

## 2019-01-01 MED FILL — LISINOPRIL 10 MG TABS: 10 | 30 days supply | Qty: 60 | Fill #1

## 2019-01-01 MED FILL — MUPIROCIN 2% OINTMENT: 2 | 10 days supply | Qty: 22 | Fill #0

## 2019-01-01 NOTE — Patient Instructions (Addendum)
Lisinopril 10 mg in am and 20 mg in pm and monitor bp. If numbers are good we can call in the 10 mg tabs instead of picking up the 20s  Anemia  Anemia is a condition in which you do not have enough red blood cells or hemoglobin. Hemoglobin is a substance in red blood cells that carries oxygen. When you do not have enough red blood cells or hemoglobin (are anemic), your body cannot get enough oxygen and your organs may not work properly. As a result, you may feel very tired or have other problems. What are the causes? Common causes of anemia include:  Excessive bleeding. Anemia can be caused by excessive bleeding inside or outside the body, including bleeding from the intestine or from periods in women.  Poor nutrition.  Long-lasting (chronic) kidney, thyroid, and liver disease.  Bone marrow disorders.  Cancer and treatments for cancer.  HIV (human immunodeficiency virus) and AIDS (acquired immunodeficiency syndrome).  Treatments for HIV and AIDS.  Spleen problems.  Blood disorders.  Infections, medicines, and autoimmune disorders that destroy red blood cells. What are the signs or symptoms? Symptoms of this condition include:  Minor weakness.  Dizziness.  Headache.  Feeling heartbeats that are irregular or faster than normal (palpitations).  Shortness of breath, especially with exercise.  Paleness.  Cold sensitivity.  Indigestion.  Nausea.  Difficulty sleeping.  Difficulty concentrating. Symptoms may occur suddenly or develop slowly. If your anemia is mild, you may not have symptoms. How is this diagnosed? This condition is diagnosed based on:  Blood tests.  Your medical history.  A physical exam.  Bone marrow biopsy. Your health care provider may also check your stool (feces) for blood and may do additional testing to look for the cause of your bleeding. You may also have other tests, including:  Imaging tests, such as a CT scan or  MRI.  Endoscopy.  Colonoscopy. How is this treated? Treatment for this condition depends on the cause. If you continue to lose a lot of blood, you may need to be treated at a hospital. Treatment may include:  Taking supplements of iron, vitamin L93, or folic acid.  Taking a hormone medicine (erythropoietin) that can help to stimulate red blood cell growth.  Having a blood transfusion. This may be needed if you lose a lot of blood.  Making changes to your diet.  Having surgery to remove your spleen. Follow these instructions at home:  Take over-the-counter and prescription medicines only as told by your health care provider.  Take supplements only as told by your health care provider.  Follow any diet instructions that you were given.  Keep all follow-up visits as told by your health care provider. This is important. Contact a health care provider if:  You develop new bleeding anywhere in the body. Get help right away if:  You are very weak.  You are short of breath.  You have pain in your abdomen or chest.  You are dizzy or feel faint.  You have trouble concentrating.  You have bloody or black, tarry stools.  You vomit repeatedly or you vomit up blood. Summary  Anemia is a condition in which you do not have enough red blood cells or enough of a substance in your red blood cells that carries oxygen (hemoglobin).  Symptoms may occur suddenly or develop slowly.  If your anemia is mild, you may not have symptoms.  This condition is diagnosed with blood tests as well as a medical history  and physical exam. Other tests may be needed.  Treatment for this condition depends on the cause of the anemia. This information is not intended to replace advice given to you by your health care provider. Make sure you discuss any questions you have with your health care provider. Document Released: 04/19/2004 Document Revised: 02/22/2017 Document Reviewed: 04/13/2016 Elsevier  Patient Education  2020 [employer].

## 2019-01-04 DIAGNOSIS — J3489 Other specified disorders of nose and nasal sinuses: Secondary | ICD-10-CM | POA: Insufficient documentation

## 2019-01-04 NOTE — Assessment & Plan Note (Signed)
Now had lesions removed off lateral left hip and right temple

## 2019-01-04 NOTE — Assessment & Plan Note (Addendum)
Now on CPAP nightly and her headaches have improved some

## 2019-01-04 NOTE — Assessment & Plan Note (Signed)
Left septum, given mupirocin to use qhs report if no resolution

## 2019-01-04 NOTE — Assessment & Plan Note (Signed)
Encouraged increased hydration, 64 ounces of clear fluids daily. Minimize alcohol and caffeine. Eat small frequent meals with lean proteins and complex carbs. Avoid high and low blood sugars. Get adequate sleep, 7-8 hours a night. Needs exercise daily preferably in the morning. Improved with cpap and better bp control

## 2019-01-04 NOTE — Progress Notes (Signed)
Subjective:    Patient ID: Carrie Elliott, female    DOB: 1951/03/01, 68 y.o.   MRN: [id number]  No chief complaint on file.   HPI Patient is in today for follow up on hypertension, sleep apnea, headaches and more. Overall she is doing much better. Is now using CPAP nightly. Her blood pressure is much better but sometimes her systolics can be in the 0000000 and she might feel slightly woozey upon arising. No recent febrile Illness or hospitalizations. Denies CP/palp/SOB/HA/congestion/fevers/GI or GU c/o. Taking meds as prescribed  Past Medical History:  Diagnosis Date  . Allergic rhinitis   . Allergy   . Anemia 08/24/2014  . Arthritis    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, shoulders, knees  . Benign paroxysmal positional vertigo 07/12/2013  . Chicken pox   . Cystocele    grade 2  . Endometriosis    with Menometrorhaghia   . Gastric polyps 09/30/2015  . Gastroesophageal reflux disease with hiatal hernia    Cough Sees Dr Laretta Bolster   . GERD (gastroesophageal reflux disease)   . Hemorrhoids   . History of colonic polyps   . History of colonic polyps 09/30/2015   adenoma  . HTN (hypertension) 04/23/2013  . Hypocalcemia 09/30/2015  . Hyponatremia 11/23/2016  . IBS (irritable bowel syndrome)   . Measles   . Mumps   . Muscle spasm 09/30/2015  . Osteopenia 10/09/2015  . Rectocele    grade 2  . Reflux    cough  . Rosacea   . Sternal fracture    1986  . Sun-damaged skin 09/05/2014  . Vertigo 11/23/2016  . Vitamin D deficiency 04/06/2016  . 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 APPENDECTOMY  1989  . atypical nevus excision    . BREAST BIOPSY Right   . CHOLECYSTECTOMY  1989  . COLONOSCOPY  2011  . COLONOSCOPY WITH PROPOFOL N/A 05/27/2015   Procedure: COLONOSCOPY WITH PROPOFOL;  Surgeon: Clarene Essex, MD;  Location: [facility];  Service: Endoscopy;  Laterality: N/A;  . CYSTOSCOPY W/ URETERAL STENT REMOVAL  1999  . ESOPHAGOGASTRODUODENOSCOPY  2011   with esophageal  stent, perforation   . ESOPHAGOGASTRODUODENOSCOPY (EGD) WITH PROPOFOL N/A 05/27/2015   Procedure: ESOPHAGOGASTRODUODENOSCOPY (EGD) WITH PROPOFOL;  Surgeon: Clarene Essex, MD;  Location: [facility];  Service: Endoscopy;  Laterality: N/A;  . HEMORRHOID SURGERY  2005  . LEFT OOPHORECTOMY  [id number]   complicated by ureteral injuery requiring a ureteral stent   . MMK / ANTERIOR VESICOURETHROPEXY / URETHROPEXY  2000  . TONSILLECTOMY AND ADENOIDECTOMY    . TOTAL ABDOMINAL HYSTERECTOMY  2000  . TUBAL LIGATION  1981  . wisdom teeth removal  1964    Family History  Problem Relation Age of Onset  . Colon polyps Father   . Hypertension Father   . Heart failure Father   . Melanoma Father        multiple  . Heart attack Father        x2  . Heart disease Father        Bundle Block  . Meniere's disease Father   . Cancer Father        melanoma  . Osteoporosis Mother   . Transient ischemic attack Mother   . Cancer Mother        colon  . Heart disease Mother        afib  . Hyperlipidemia Mother   . Diabetes Paternal Grandmother   .  Stroke Maternal Grandmother   . Cancer Maternal Grandmother        skin, melanoma, sarcoma  . Colon polyps Maternal Grandmother   . Diverticulitis Maternal Grandmother   . Stroke Maternal Grandfather   . Heart disease Maternal Grandfather   . Obesity Brother     Social History   Socioeconomic History  . Marital status: Married    Spouse name: Not on file  . Number of children: Not on file  . Years of education: Not on file  . Highest education level: Not on file  Occupational History  . Not on file  Social Needs  . Financial resource strain: Not on file  . Food insecurity    Worry: Not on file    Inability: Not on file  . Transportation needs    Medical: Not on file    Non-medical: Not on file  Tobacco Use  . Smoking status: Never Smoker  . Smokeless tobacco: Never Used  Substance and Sexual Activity  . Alcohol use: No  . Drug use: No  . 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 Physical activity    Days per week: Not on file    Minutes per session: Not on file  . Stress: Not on file  Relationships  . Social Herbalist on phone: Not on file    Gets together: Not on file    Attends religious service: Not on file    Active member of club or organization: Not on file    Attends meetings of clubs or organizations: Not on file    Relationship status: Not on file  . Intimate partner violence    Fear of current or ex partner: Not on file    Emotionally abused: Not on file    Physically abused: Not on file    Forced sexual activity: Not on file  Other Topics Concern  . Not on file  Social History Narrative  . Not on file    Outpatient Medications Prior to Visit  Medication Sig Dispense Refill  . acetaminophen (TYLENOL) 500 MG tablet Take 1,000 mg by mouth every 6 (six) hours as needed for headache (pain).    . benzocaine (ORAJEL) 10 % mucosal gel Use as directed 1 application in the mouth or throat as needed for mouth pain. Reported on 07/22/2015    . esomeprazole (NEXIUM) 40 MG capsule   3  . famotidine (PEPCID) 20 MG tablet     . fexofenadine (ALLEGRA) 180 MG tablet Take 180 mg by mouth as needed.     . hyoscyamine (LEVSIN SL) 0.125 MG SL tablet Place 1 tablet (0.125 mg total) under the tongue every 4 (four) hours as needed. 30 tablet 0  . Lidocaine-Hydrocortisone Ace 3-0.5 % CREA Apply 1 application topically daily as needed (hemmoriods.).     Marland Kitchen meclizine (ANTIVERT) 25 MG tablet Take 1 tablet (25 mg total) by mouth 3 (three) times daily as needed for dizziness. 30 tablet 2  . metoprolol succinate (TOPROL-XL) 50 MG 24 hr tablet TAKE 1-2 TABLETS (50-100 MG TOTAL) BY MOUTH 2 (TWO) TIMES DAILY. TAKE WITH OR IMMEDIATELY FOLLOWING A MEAL. 360 tablet 0  . metroNIDAZOLE (FLAGYL) 500 MG tablet Take 1 tablet (500 mg total) by mouth 3 (three) times daily.  21 tablet 0  . mometasone (NASONEX) 50 MCG/ACT nasal spray Place 1-2 sprays into the nose daily as needed. Reported  on 07/22/2015 17 g 5  . ondansetron (ZOFRAN) 8 MG tablet Take 1 tablet (8 mg total) by mouth every 8 (eight) hours as needed for nausea or vomiting. 90 tablet 1  . SIMPLY SALINE NA Place 1 each into the nose daily.     . hydrochlorothiazide (MICROZIDE) 12.5 MG capsule Take 1 capsule (12.5 mg total) by mouth daily. 30 capsule 0  . levofloxacin (LEVAQUIN) 500 MG tablet Take 1 tablet (500 mg total) by mouth daily. 7 tablet 0  . lisinopril (ZESTRIL) 20 MG tablet Take 1 tablet (20 mg total) by mouth 2 (two) times a day. 60 tablet 2   No facility-administered medications prior to visit.     Allergies  Allergen Reactions  . Cefdinir     HIVES  . Celebrex [Celecoxib]     ABDOMINAL PAIN   . Ciprofloxacin     ABDOMINAL PAIN   . Codeine     NAUSEA VOMITING - can take with zofran  . Pseudoephedrine Hcl Er     TACHYCARDIA   . Singulair [Montelukast Sodium] Other (See Comments)    Mental status changes, irritable    Review of Systems  Constitutional: Negative for fever and malaise/fatigue.  HENT: Negative for congestion.   Eyes: Negative for blurred vision.  Respiratory: Negative for shortness of breath.   Cardiovascular: Negative for chest pain, palpitations and leg swelling.  Gastrointestinal: Negative for abdominal pain, blood in stool and nausea.  Genitourinary: Negative for dysuria and frequency.  Musculoskeletal: Negative for falls.  Skin: Negative for rash.  Neurological: Positive for dizziness and headaches. Negative for loss of consciousness.  Endo/Heme/Allergies: Negative for environmental allergies.  Psychiatric/Behavioral: Negative for depression. The patient is not nervous/anxious.        Objective:    Physical Exam Vitals signs and nursing note reviewed.  Constitutional:      General: She is not in acute distress.    Appearance: She is well-developed.   HENT:     Head: Normocephalic and atraumatic.     Nose:     Comments: White ulcerated spot on left anterior nasal septum Eyes:     General:        Right eye: No discharge.        Left eye: No discharge.  Neck:     Musculoskeletal: Normal range of motion and neck supple.  Cardiovascular:     Rate and Rhythm: Normal rate and regular rhythm.     Heart sounds: No murmur.  Pulmonary:     Effort: Pulmonary effort is normal.     Breath sounds: Normal breath sounds.  Abdominal:     General: Bowel sounds are normal.     Palpations: Abdomen is soft.     Tenderness: There is no abdominal tenderness.  Skin:    General: Skin is warm and dry.  Neurological:     Mental Status: She is alert and oriented to person, place, and time.     BP 122/68 (BP Location: Left Arm, Patient Position: Sitting, Cuff Size: Normal)   Pulse 65   Temp (!) 96.6 F (35.9 C) (Temporal)   Resp 18   Wt 166 lb 12.8 oz (75.7 kg)   SpO2 99%   BMI 31.01 kg/m  Wt Readings from Last 3 Encounters:  01/01/19 166 lb 12.8 oz (75.7 kg)  09/30/18 168 lb 3.2 oz (76.3 kg)  09/03/18 163 lb (73.9 kg)    Diabetic Foot Exam - Simple   No data filed  Lab Results  Component Value Date   WBC 5.5 12/18/2018   HGB 11.2 (L) 12/18/2018   HCT 34.5 (L) 12/18/2018   PLT 288.0 12/18/2018   GLUCOSE 88 12/18/2018   CHOL 207 (H) 12/18/2018   TRIG 128.0 12/18/2018   HDL 57.40 12/18/2018   LDLDIRECT 106.0 02/18/2018   LDLCALC 124 (H) 12/18/2018   ALT 14 12/18/2018   AST 15 12/18/2018   NA 137 12/18/2018   K 3.9 12/18/2018   CL 101 12/18/2018   CREATININE 0.76 12/18/2018   BUN 16 12/18/2018   CO2 29 12/18/2018   TSH 1.20 12/18/2018   INR 1.19 02/07/2010   HGBA1C 5.9 12/18/2018    Lab Results  Component Value Date   TSH 1.20 12/18/2018   Lab Results  Component Value Date   WBC 5.5 12/18/2018   HGB 11.2 (L) 12/18/2018   HCT 34.5 (L) 12/18/2018   MCV 81.8 12/18/2018   PLT 288.0 12/18/2018   Lab Results   Component Value Date   NA 137 12/18/2018   K 3.9 12/18/2018   CO2 29 12/18/2018   GLUCOSE 88 12/18/2018   BUN 16 12/18/2018   CREATININE 0.76 12/18/2018   BILITOT 0.4 12/18/2018   ALKPHOS 59 12/18/2018   AST 15 12/18/2018   ALT 14 12/18/2018   PROT 6.9 12/18/2018   ALBUMIN 4.1 12/18/2018   CALCIUM 9.4 12/18/2018   ANIONGAP 8 05/31/2016   GFR 75.63 12/18/2018   Lab Results  Component Value Date   CHOL 207 (H) 12/18/2018   Lab Results  Component Value Date   HDL 57.40 12/18/2018   Lab Results  Component Value Date   LDLCALC 124 (H) 12/18/2018   Lab Results  Component Value Date   TRIG 128.0 12/18/2018   Lab Results  Component Value Date   CHOLHDL 4 12/18/2018   Lab Results  Component Value Date   HGBA1C 5.9 12/18/2018       Assessment & Plan:   Problem List Items Addressed This Visit    HTN (hypertension) (Chronic)    Denies CP/palp/SOB/HA/congestion/fevers/GI or GU c/o. Taking meds as prescribed. Refill given on hctz      Relevant Medications   lisinopril (ZESTRIL) 20 MG tablet   hydrochlorothiazide (MICROZIDE) 12.5 MG capsule   Headache    Encouraged increased hydration, 64 ounces of clear fluids daily. Minimize alcohol and caffeine. Eat small frequent meals with lean proteins and complex carbs. Avoid high and low blood sugars. Get adequate sleep, 7-8 hours a night. Needs exercise daily preferably in the morning. Improved with cpap and better bp control      Sun-damaged skin    Now had lesions removed off lateral left hip and right temple      Sleep apnea    Now on CPAP nightly and her headaches have improved some      Nasal sore    Left septum, given mupirocin to use qhs report if no resolution         I have discontinued Carrie Elliott's levofloxacin. I have also changed her lisinopril. Additionally, I am having her start on mupirocin ointment. Lastly, I am having her maintain her fexofenadine, acetaminophen, benzocaine,  Lidocaine-Hydrocortisone Ace, SIMPLY SALINE NA, meclizine, ondansetron, mometasone, famotidine, esomeprazole, hyoscyamine, metroNIDAZOLE, metoprolol succinate, and hydrochlorothiazide.  Meds ordered this encounter  Medications  . lisinopril (ZESTRIL) 20 MG tablet    Sig: Take 1 tablet (20 mg total) by mouth 2 (two) times daily.    Dispense:  180 tablet  Refill:  2  . hydrochlorothiazide (MICROZIDE) 12.5 MG capsule    Sig: Take 1 capsule (12.5 mg total) by mouth daily.    Dispense:  90 capsule    Refill:  2  . mupirocin ointment (BACTROBAN) 2 %    Sig: Place 1 application into the nose 2 (two) times daily.    Dispense:  22 g    Refill:  0     Penni Homans, MD

## 2019-01-04 NOTE — Assessment & Plan Note (Signed)
Denies CP/palp/SOB/HA/congestion/fevers/GI or GU c/o. Taking meds as prescribed. Refill given on hctz

## 2019-01-12 MED FILL — HYDROCHLOROTHIAZIDE 12.5 MG: 12.5 | 90 days supply | Qty: 90 | Fill #0

## 2019-01-13 MED FILL — FAMOTIDINE 20 MG TABS: 20 | 30 days supply | Qty: 60 | Fill #0

## 2019-01-13 MED FILL — ESOMEPRAZOLE MAG DR 40 MG C: 40 | 30 days supply | Qty: 30 | Fill #0

## 2019-01-15 MED FILL — LISINOPRIL 20 MG TABLET: 20 | 90 days supply | Qty: 180 | Fill #0

## 2019-01-27 MED FILL — PEG-3350 SOLUTION: 420 | 1 days supply | Qty: 4000 | Fill #0

## 2019-01-28 DIAGNOSIS — Z1159 Encounter for screening for other viral diseases: Secondary | ICD-10-CM | POA: Diagnosis not present

## 2019-01-29 ENCOUNTER — Ambulatory Visit: Payer: Medicare Other | Admitting: Family Medicine

## 2019-02-02 ENCOUNTER — Encounter: Payer: Self-pay | Admitting: Family Medicine

## 2019-02-02 DIAGNOSIS — Z8371 Family history of colonic polyps: Secondary | ICD-10-CM | POA: Diagnosis not present

## 2019-02-02 DIAGNOSIS — K317 Polyp of stomach and duodenum: Secondary | ICD-10-CM | POA: Diagnosis not present

## 2019-02-02 DIAGNOSIS — K449 Diaphragmatic hernia without obstruction or gangrene: Secondary | ICD-10-CM | POA: Diagnosis not present

## 2019-02-02 DIAGNOSIS — D509 Iron deficiency anemia, unspecified: Secondary | ICD-10-CM | POA: Diagnosis not present

## 2019-02-02 DIAGNOSIS — Z800 Family history of malignant neoplasm of digestive organs: Secondary | ICD-10-CM | POA: Diagnosis not present

## 2019-02-02 DIAGNOSIS — R195 Other fecal abnormalities: Secondary | ICD-10-CM | POA: Diagnosis not present

## 2019-02-02 DIAGNOSIS — K5730 Diverticulosis of large intestine without perforation or abscess without bleeding: Secondary | ICD-10-CM | POA: Diagnosis not present

## 2019-02-02 DIAGNOSIS — D122 Benign neoplasm of ascending colon: Secondary | ICD-10-CM | POA: Diagnosis not present

## 2019-02-02 DIAGNOSIS — Z86010 Personal history of colonic polyps: Secondary | ICD-10-CM | POA: Diagnosis not present

## 2019-02-02 DIAGNOSIS — K219 Gastro-esophageal reflux disease without esophagitis: Secondary | ICD-10-CM | POA: Diagnosis not present

## 2019-02-02 LAB — HM COLONOSCOPY

## 2019-02-03 ENCOUNTER — Encounter: Payer: Self-pay | Admitting: Family Medicine

## 2019-02-03 ENCOUNTER — Ambulatory Visit (INDEPENDENT_AMBULATORY_CARE_PROVIDER_SITE_OTHER): Payer: Medicare Other | Admitting: Family Medicine

## 2019-02-03 ENCOUNTER — Other Ambulatory Visit: Payer: Self-pay

## 2019-02-03 DIAGNOSIS — I10 Essential (primary) hypertension: Secondary | ICD-10-CM | POA: Diagnosis not present

## 2019-02-03 DIAGNOSIS — R739 Hyperglycemia, unspecified: Secondary | ICD-10-CM | POA: Diagnosis not present

## 2019-02-03 DIAGNOSIS — D649 Anemia, unspecified: Secondary | ICD-10-CM

## 2019-02-03 DIAGNOSIS — Z86010 Personal history of colonic polyps: Secondary | ICD-10-CM

## 2019-02-03 DIAGNOSIS — K317 Polyp of stomach and duodenum: Secondary | ICD-10-CM | POA: Diagnosis not present

## 2019-02-03 NOTE — Progress Notes (Signed)
Been on aspirin had blood in her stool

## 2019-02-04 NOTE — Assessment & Plan Note (Signed)
Mild and blood noted in stool none seen by naked eye just by testing. She will continue to monitor her stool and for now at least stay on Aspirin. Increase leafy greens, consider increased lean red meat and using cast iron cookware. Continue to monitor, report any concerns

## 2019-02-04 NOTE — Assessment & Plan Note (Signed)
Underwent colonoscopy on 11/9 with recurrent polyps noted again, she will continue to follow up with Dr Watt Climes

## 2019-02-04 NOTE — Assessment & Plan Note (Signed)
Blood pressure now running low frequent with 80s over 60s seen lately at times. Will drop her Lisinopril to 10 mg bid and she will continue to monitor

## 2019-02-04 NOTE — Assessment & Plan Note (Signed)
She had a repeat endoscopy on 02/02/19 which showed recurrent polyps so she will continue to need serial endoscopy done by Dr Watt Climes.

## 2019-02-04 NOTE — Progress Notes (Signed)
Patient ID: Carrie Elliott, female   DOB: Aug 14, 1950, 68 y.o.   MRN: [id number] Virtual Visit via Video Note  I connected with Carrie Elliott on 02/03/19 at  2:40 PM EST by a video enabled telemedicine application and verified that I am speaking with the correct person using two identifiers.  Location: Patient: home Provider: office   I discussed the limitations of evaluation and management by telemedicine and the availability of in person appointments. The patient expressed understanding and agreed to proceed. Magdalene Molly, CMA was able to get the patient set up on video visit.    Subjective:    Patient ID: Carrie Elliott, female    DOB: 30-Jul-1950, 68 y.o.   MRN: [id number]  No chief complaint on file.   HPI Patient is in today for follow up on hypertension, anemia and colon polyps. No recent febrile illness or hospitalizations. She just underwent upper endoscopy and colonoscopy on 02/02/19 with Dr Watt Climes. Gastric and colon polyps found again. Patient has not seen any blood in stool or melena. No anorexia, nausea, vomiting, abdominal pain etc. Denies CP/palp/SOB/HA/congestion/fevers/GI or GU c/o. Taking meds as prescribed  Past Medical History:  Diagnosis Date  . Allergic rhinitis   . Allergy   . Anemia 08/24/2014  . Arthritis    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, shoulders, knees  . Benign paroxysmal positional vertigo 07/12/2013  . Chicken pox   . Cystocele    grade 2  . Endometriosis    with Menometrorhaghia   . Gastric polyps 09/30/2015  . Gastroesophageal reflux disease with hiatal hernia    Cough Sees Dr Laretta Bolster   . GERD (gastroesophageal reflux disease)   . Hemorrhoids   . History of colonic polyps   . History of colonic polyps 09/30/2015   adenoma  . HTN (hypertension) 04/23/2013  . Hypocalcemia 09/30/2015  . Hyponatremia 11/23/2016  . IBS (irritable bowel syndrome)   . Measles   . Mumps   . Muscle spasm 09/30/2015  . Osteopenia 10/09/2015  . Rectocele    grade 2  . Reflux    cough  .  Rosacea   . Sternal fracture    1986  . Sun-damaged skin 09/05/2014  . Vertigo 11/23/2016  . Vitamin D deficiency 04/06/2016  . 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 APPENDECTOMY  1989  . atypical nevus excision    . BREAST BIOPSY Right   . CHOLECYSTECTOMY  1989  . COLONOSCOPY  2011  . COLONOSCOPY WITH PROPOFOL N/A 05/27/2015   Procedure: COLONOSCOPY WITH PROPOFOL;  Surgeon: Clarene Essex, MD;  Location: [facility];  Service: Endoscopy;  Laterality: N/A;  . CYSTOSCOPY W/ URETERAL STENT REMOVAL  1999  . ESOPHAGOGASTRODUODENOSCOPY  2011   with esophageal stent, perforation   . ESOPHAGOGASTRODUODENOSCOPY (EGD) WITH PROPOFOL N/A 05/27/2015   Procedure: ESOPHAGOGASTRODUODENOSCOPY (EGD) WITH PROPOFOL;  Surgeon: Clarene Essex, MD;  Location: [facility];  Service: Endoscopy;  Laterality: N/A;  . HEMORRHOID SURGERY  2005  . LEFT OOPHORECTOMY  [id number]   complicated by ureteral injuery requiring a ureteral stent   . MMK / ANTERIOR VESICOURETHROPEXY / URETHROPEXY  2000  . TONSILLECTOMY AND ADENOIDECTOMY    . TOTAL ABDOMINAL HYSTERECTOMY  2000  . TUBAL LIGATION  1981  . wisdom teeth removal  1964    Family History  Problem Relation Age of Onset  . Colon polyps Father   . Hypertension Father   . Heart failure Father   . Melanoma  Father        multiple  . Heart attack Father        x2  . Heart disease Father        Bundle Block  . Meniere's disease Father   . Cancer Father        melanoma  . Osteoporosis Mother   . Transient ischemic attack Mother   . Cancer Mother        colon  . Heart disease Mother        afib  . Hyperlipidemia Mother   . Diabetes Paternal Grandmother   . Stroke Maternal Grandmother   . Cancer Maternal Grandmother        skin, melanoma, sarcoma  . Colon polyps Maternal Grandmother   . Diverticulitis Maternal Grandmother   . Stroke Maternal Grandfather   . Heart disease Maternal Grandfather   . Obesity Brother      Social History   Socioeconomic History  . Marital status: Married    Spouse name: Not on file  . Number of children: Not on file  . Years of education: Not on file  . Highest education level: Not on file  Occupational History  . Not on file  Social Needs  . Financial resource strain: Not on file  . Food insecurity    Worry: Not on file    Inability: Not on file  . Transportation needs    Medical: Not on file    Non-medical: Not on file  Tobacco Use  . Smoking status: Never Smoker  . Smokeless tobacco: Never Used  Substance and Sexual Activity  . Alcohol use: No  . Drug use: No  . 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 Physical activity    Days per week: Not on file    Minutes per session: Not on file  . Stress: Not on file  Relationships  . Social Herbalist on phone: Not on file    Gets together: Not on file    Attends religious service: Not on file    Active member of club or organization: Not on file    Attends meetings of clubs or organizations: Not on file    Relationship status: Not on file  . Intimate partner violence    Fear of current or ex partner: Not on file    Emotionally abused: Not on file    Physically abused: Not on file    Forced sexual activity: Not on file  Other Topics Concern  . Not on file  Social History Narrative  . Not on file    Outpatient Medications Prior to Visit  Medication Sig Dispense Refill  . acetaminophen (TYLENOL) 500 MG tablet Take 1,000 mg by mouth every 6 (six) hours as needed for headache (pain).    . benzocaine (ORAJEL) 10 % mucosal gel Use as directed 1 application in the mouth or throat as needed for mouth pain. Reported on 07/22/2015    . esomeprazole (NEXIUM) 40 MG capsule   3  . famotidine (PEPCID) 20 MG tablet     . fexofenadine (ALLEGRA) 180 MG tablet Take 180 mg by mouth as needed.     . hydrochlorothiazide (MICROZIDE) 12.5 MG  capsule Take 1 capsule (12.5 mg total) by mouth daily. 90 capsule 2  . hyoscyamine (LEVSIN SL) 0.125 MG SL tablet Place 1 tablet (0.125 mg total) under the tongue every 4 (  four) hours as needed. 30 tablet 0  . Lidocaine-Hydrocortisone Ace 3-0.5 % CREA Apply 1 application topically daily as needed (hemmoriods.).     Marland Kitchen lisinopril (ZESTRIL) 20 MG tablet Take 1 tablet (20 mg total) by mouth 2 (two) times daily. 180 tablet 2  . meclizine (ANTIVERT) 25 MG tablet Take 1 tablet (25 mg total) by mouth 3 (three) times daily as needed for dizziness. 30 tablet 2  . metoprolol succinate (TOPROL-XL) 50 MG 24 hr tablet TAKE 1-2 TABLETS (50-100 MG TOTAL) BY MOUTH 2 (TWO) TIMES DAILY. TAKE WITH OR IMMEDIATELY FOLLOWING A MEAL. 360 tablet 0  . metroNIDAZOLE (FLAGYL) 500 MG tablet Take 1 tablet (500 mg total) by mouth 3 (three) times daily. 21 tablet 0  . mometasone (NASONEX) 50 MCG/ACT nasal spray Place 1-2 sprays into the nose daily as needed. Reported on 07/22/2015 17 g 5  . mupirocin ointment (BACTROBAN) 2 % Place 1 application into the nose 2 (two) times daily. 22 g 0  . ondansetron (ZOFRAN) 8 MG tablet Take 1 tablet (8 mg total) by mouth every 8 (eight) hours as needed for nausea or vomiting. 90 tablet 1  . SIMPLY SALINE NA Place 1 each into the nose daily.      No facility-administered medications prior to visit.     Allergies  Allergen Reactions  . Cefdinir     HIVES  . Celebrex [Celecoxib]     ABDOMINAL PAIN   . Ciprofloxacin     ABDOMINAL PAIN   . Codeine     NAUSEA VOMITING - can take with zofran  . Pseudoephedrine Hcl Er     TACHYCARDIA   . Singulair [Montelukast Sodium] Other (See Comments)    Mental status changes, irritable    Review of Systems  Constitutional: Negative for fever and malaise/fatigue.  HENT: Negative for congestion.   Eyes: Negative for blurred vision.  Respiratory: Negative for shortness of breath.   Cardiovascular: Negative for chest pain, palpitations and leg  swelling.  Gastrointestinal: Negative for abdominal pain, blood in stool and nausea.  Genitourinary: Negative for dysuria and frequency.  Musculoskeletal: Negative for falls.  Skin: Negative for rash.  Neurological: Negative for dizziness, loss of consciousness and headaches.  Endo/Heme/Allergies: Negative for environmental allergies.  Psychiatric/Behavioral: Negative for depression. The patient is not nervous/anxious.        Objective:    Physical Exam Constitutional:      Appearance: Normal appearance. She is not ill-appearing.  HENT:     Nose: Nose normal.  Eyes:     General:        Right eye: No discharge.        Left eye: No discharge.  Pulmonary:     Effort: Pulmonary effort is normal.  Neurological:     Mental Status: She is alert and oriented to person, place, and time.  Psychiatric:        Mood and Affect: Mood normal.        Behavior: Behavior normal.     BP (!) 86/54 (BP Location: Left Arm, Patient Position: Sitting, Cuff Size: Normal)   Pulse 63   Temp 98.6 F (37 C) (Oral)  Wt Readings from Last 3 Encounters:  01/01/19 166 lb 12.8 oz (75.7 kg)  09/30/18 168 lb 3.2 oz (76.3 kg)  09/03/18 163 lb (73.9 kg)    Diabetic Foot Exam - Simple   No data filed     Lab Results  Component Value Date   WBC 5.5 12/18/2018  HGB 11.2 (L) 12/18/2018   HCT 34.5 (L) 12/18/2018   PLT 288.0 12/18/2018   GLUCOSE 88 12/18/2018   CHOL 207 (H) 12/18/2018   TRIG 128.0 12/18/2018   HDL 57.40 12/18/2018   LDLDIRECT 106.0 02/18/2018   LDLCALC 124 (H) 12/18/2018   ALT 14 12/18/2018   AST 15 12/18/2018   NA 137 12/18/2018   K 3.9 12/18/2018   CL 101 12/18/2018   CREATININE 0.76 12/18/2018   BUN 16 12/18/2018   CO2 29 12/18/2018   TSH 1.20 12/18/2018   INR 1.19 02/07/2010   HGBA1C 5.9 12/18/2018    Lab Results  Component Value Date   TSH 1.20 12/18/2018   Lab Results  Component Value Date   WBC 5.5 12/18/2018   HGB 11.2 (L) 12/18/2018   HCT 34.5 (L)  12/18/2018   MCV 81.8 12/18/2018   PLT 288.0 12/18/2018   Lab Results  Component Value Date   NA 137 12/18/2018   K 3.9 12/18/2018   CO2 29 12/18/2018   GLUCOSE 88 12/18/2018   BUN 16 12/18/2018   CREATININE 0.76 12/18/2018   BILITOT 0.4 12/18/2018   ALKPHOS 59 12/18/2018   AST 15 12/18/2018   ALT 14 12/18/2018   PROT 6.9 12/18/2018   ALBUMIN 4.1 12/18/2018   CALCIUM 9.4 12/18/2018   ANIONGAP 8 05/31/2016   GFR 75.63 12/18/2018   Lab Results  Component Value Date   CHOL 207 (H) 12/18/2018   Lab Results  Component Value Date   HDL 57.40 12/18/2018   Lab Results  Component Value Date   LDLCALC 124 (H) 12/18/2018   Lab Results  Component Value Date   TRIG 128.0 12/18/2018   Lab Results  Component Value Date   CHOLHDL 4 12/18/2018   Lab Results  Component Value Date   HGBA1C 5.9 12/18/2018       Assessment & Plan:   Problem List Items Addressed This Visit    HTN (hypertension) (Chronic)    Blood pressure now running low frequent with 80s over 60s seen lately at times. Will drop her Lisinopril to 10 mg bid and she will continue to monitor      Anemia    Mild and blood noted in stool none seen by naked eye just by testing. She will continue to monitor her stool and for now at least stay on Aspirin. Increase leafy greens, consider increased lean red meat and using cast iron cookware. Continue to monitor, report any concerns      History of colonic polyps    Underwent colonoscopy on 11/9 with recurrent polyps noted again, she will continue to follow up with Dr Watt Climes      Gastric polyps    She had a repeat endoscopy on 02/02/19 which showed recurrent polyps so she will continue to need serial endoscopy done by Dr Watt Climes.      Hyperglycemia    minimize simple carbs. Increase exercise as tolerated.         I am having Carrie Elliott. Carrie Elliott maintain her fexofenadine, acetaminophen, benzocaine, Lidocaine-Hydrocortisone Ace, SIMPLY SALINE NA, meclizine,  ondansetron, mometasone, famotidine, esomeprazole, hyoscyamine, metroNIDAZOLE, metoprolol succinate, lisinopril, hydrochlorothiazide, and mupirocin ointment.  No orders of the defined types were placed in this encounter.    I discussed the assessment and treatment plan with the patient. The patient was provided an opportunity to ask questions and all were answered. The patient agreed with the plan and demonstrated an understanding of the instructions.   The patient was  advised to call back or seek an in-person evaluation if the symptoms worsen or if the condition fails to improve as anticipated.  I provided 25 minutes of non-face-to-face time during this encounter.   Penni Homans, MD

## 2019-02-04 NOTE — Assessment & Plan Note (Signed)
minimize simple carbs. Increase exercise as tolerated.  

## 2019-02-05 DIAGNOSIS — D122 Benign neoplasm of ascending colon: Secondary | ICD-10-CM | POA: Diagnosis not present

## 2019-02-05 DIAGNOSIS — K317 Polyp of stomach and duodenum: Secondary | ICD-10-CM | POA: Diagnosis not present

## 2019-02-10 ENCOUNTER — Encounter: Payer: Self-pay | Admitting: Family Medicine

## 2019-02-10 NOTE — Telephone Encounter (Signed)
Noted, results have been abstracted

## 2019-02-13 ENCOUNTER — Encounter: Payer: Self-pay | Admitting: Family Medicine

## 2019-02-16 ENCOUNTER — Other Ambulatory Visit: Payer: Self-pay | Admitting: Family Medicine

## 2019-02-18 ENCOUNTER — Encounter: Payer: Self-pay | Admitting: Family Medicine

## 2019-02-18 ENCOUNTER — Other Ambulatory Visit: Payer: Self-pay | Admitting: Family Medicine

## 2019-02-18 MED ORDER — LISINOPRIL 10 MG PO TABS: 10.0000 mg | ORAL_TABLET | Freq: Two times a day (BID) | ORAL | 1 refills | Status: DC

## 2019-02-18 MED ORDER — LISINOPRIL 20 MG PO TABS: 20.0000 mg | ORAL_TABLET | Freq: Two times a day (BID) | ORAL | 2 refills | Status: DC

## 2019-02-18 MED FILL — LISINOPRIL 10 MG TABS: 10 | 90 days supply | Qty: 180 | Fill #0

## 2019-02-23 MED FILL — ESOMEPRAZOLE MAG DR 40 MG C: 40 | 30 days supply | Qty: 30 | Fill #1

## 2019-02-26 ENCOUNTER — Other Ambulatory Visit: Payer: Self-pay

## 2019-02-26 ENCOUNTER — Ambulatory Visit (INDEPENDENT_AMBULATORY_CARE_PROVIDER_SITE_OTHER): Payer: Medicare Other | Admitting: Pulmonary Disease

## 2019-02-26 ENCOUNTER — Encounter: Payer: Self-pay | Admitting: Pulmonary Disease

## 2019-02-26 VITALS — BP 122/62 | HR 61 | Ht 61.0 in | Wt 169.6 lb

## 2019-02-26 DIAGNOSIS — G4733 Obstructive sleep apnea (adult) (pediatric): Secondary | ICD-10-CM

## 2019-02-26 DIAGNOSIS — Z9989 Dependence on other enabling machines and devices: Secondary | ICD-10-CM | POA: Diagnosis not present

## 2019-02-26 NOTE — Patient Instructions (Signed)
Mild obstructive sleep apnea with good control of symptoms  Continue using CPAP  Your download did reveal good treatment  Continue weight loss efforts towards/regular exercise  I will see you back in the office in 6 months

## 2019-02-26 NOTE — Progress Notes (Signed)
Subjective:     Patient ID: Carrie Elliott, female   DOB: 04/24/1950, 68 y.o.   MRN: [id number]  Follow-up for mild obstructive sleep apnea On treatment with CPAP  Good tolerance to CPAP so far Improved daytime activities  Machine seems to be working well Occasional issues with the mask  Sleep pattern has not really changed much Usually tries to go to bed between 10 and 10:30 PM Takes about 30 minutes to 1 hour to fall asleep Wakes up about once to 3 times during the night Wakes up about 6 AM on workdays  Improved activity levels/energy levels  Hypertension Never smoked    Review of Systems  Constitutional: Negative.   Eyes: Negative.   Respiratory: Positive for apnea. Negative for cough.   Gastrointestinal: Negative.   Endocrine: Negative.   Genitourinary: Negative.   Musculoskeletal: Negative.   Skin: Negative.   Hematological: Negative.   Psychiatric/Behavioral: Positive for sleep disturbance.  All other systems reviewed and are negative.  Past Medical History:  Diagnosis Date  . Allergic rhinitis   . Allergy   . Anemia 08/24/2014  . Arthritis    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, shoulders, knees  . Benign paroxysmal positional vertigo 07/12/2013  . Chicken pox   . Cystocele    grade 2  . Endometriosis    with Menometrorhaghia   . Gastric polyps 09/30/2015  . Gastroesophageal reflux disease with hiatal hernia    Cough Sees Dr Laretta Bolster   . GERD (gastroesophageal reflux disease)   . Hemorrhoids   . History of colonic polyps   . History of colonic polyps 09/30/2015   adenoma  . HTN (hypertension) 04/23/2013  . Hypocalcemia 09/30/2015  . Hyponatremia 11/23/2016  . IBS (irritable bowel syndrome)   . Measles   . Mumps   . Muscle spasm 09/30/2015  . Osteopenia 10/09/2015  . Rectocele    grade 2  . Reflux    cough  . Rosacea   . Sternal fracture    1986  . Sun-damaged skin 09/05/2014  . Vertigo 11/23/2016  . Vitamin D deficiency 04/06/2016  . 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 Marital status: Married    Spouse name: Not on file  . Number of children: Not on file  . Years of education: Not on file  . Highest education level: Not on file  Occupational History  . Not on file  Social Needs  . Financial resource strain: Not on file  . Food insecurity    Worry: Not on file    Inability: Not on file  . Transportation needs    Medical: Not on file    Non-medical: Not on file  Tobacco Use  . Smoking status: Never Smoker  . Smokeless tobacco: Never Used  Substance and Sexual Activity  . Alcohol use: No  . Drug use: No  . 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 Physical activity    Days per week: Not on file    Minutes per session: Not on file  . Stress: Not on file  Relationships  . Social Herbalist on phone: Not on file    Gets together: Not on file    Attends religious service: Not on file    Active member of club or organization: Not on file    Attends meetings of clubs or organizations: Not on file  Relationship status: Not on file  . Intimate partner violence    Fear of current or ex partner: Not on file    Emotionally abused: Not on file    Physically abused: Not on file    Forced sexual activity: Not on file  Other Topics Concern  . Not on file  Social History Narrative  . Not on file   Family History  Problem Relation Age of Onset  . Colon polyps Father   . Hypertension Father   . Heart failure Father   . Melanoma Father        multiple  . Heart attack Father        x2  . Heart disease Father        Bundle Block  . Meniere's disease Father   . Cancer Father        melanoma  . Osteoporosis Mother   . Transient ischemic attack Mother   . Cancer Mother        colon  . Heart disease Mother        afib  . Hyperlipidemia Mother   . Diabetes Paternal Grandmother   . Stroke Maternal Grandmother   . Cancer  Maternal Grandmother        skin, melanoma, sarcoma  . Colon polyps Maternal Grandmother   . Diverticulitis Maternal Grandmother   . Stroke Maternal Grandfather   . Heart disease Maternal Grandfather   . Obesity Brother        Objective:   Physical Exam Constitutional:      Appearance: Normal appearance. She is obese.  HENT:     Head: Normocephalic and atraumatic.     Mouth/Throat:     Mouth: Mucous membranes are moist.     Comments: Crowded oropharynx, Mallampati 2 Neck:     Musculoskeletal: Normal range of motion and neck supple. No neck rigidity or muscular tenderness.  Cardiovascular:     Rate and Rhythm: Normal rate and regular rhythm.     Heart sounds: No murmur.  Pulmonary:     Effort: Pulmonary effort is normal. No respiratory distress.     Breath sounds: Normal breath sounds. No stridor. No wheezing or rhonchi.  Neurological:     General: No focal deficit present.     Mental Status: She is alert.  Psychiatric:        Mood and Affect: Mood normal.     Vitals:   02/26/19 0925  BP: 122/62  Pulse: 61  SpO2: 96%   Results of the Epworth flowsheet 09/30/2018  Sitting and reading 1  Watching TV 1  Sitting, inactive in a public place (e.g. a theatre or a meeting) 1  As a passenger in a car for an hour without a break 2  Lying down to rest in the afternoon when circumstances permit 2  Sitting and talking to someone 0  Sitting quietly after a lunch without alcohol 1  In a car, while stopped for a few minutes in traffic 0  Total score 8   Compliance data reviewed with patient showing excellent compliance-97% Settings of 5-15, AHI of 3.5  Sleep study reviewed in epic    Assessment:     Mild obstructive sleep apnea -Improved symptoms with CPAP use -Currently on auto titrating CPAP  Excessive daytime sleepiness -Improvement in symptoms     Plan:     Importance of weight loss and weight maintenance discussed with the patient  Continue using CPAP as  ordered  I will see  her back in the office in 6 months  Encouraged to call with any significant concerns

## 2019-03-23 MED FILL — ESOMEPRAZOLE MAG DR 40 MG C: 40 | 30 days supply | Qty: 30 | Fill #2

## 2019-03-23 MED FILL — FAMOTIDINE 20 MG TABS: 20 | 30 days supply | Qty: 60 | Fill #1

## 2019-04-02 ENCOUNTER — Ambulatory Visit: Payer: Medicare Other | Admitting: Family Medicine

## 2019-04-03 ENCOUNTER — Other Ambulatory Visit: Payer: Self-pay

## 2019-04-03 ENCOUNTER — Ambulatory Visit (INDEPENDENT_AMBULATORY_CARE_PROVIDER_SITE_OTHER): Payer: Medicare Other | Admitting: Family Medicine

## 2019-04-03 ENCOUNTER — Encounter: Payer: Self-pay | Admitting: Family Medicine

## 2019-04-03 DIAGNOSIS — I10 Essential (primary) hypertension: Secondary | ICD-10-CM

## 2019-04-03 DIAGNOSIS — E538 Deficiency of other specified B group vitamins: Secondary | ICD-10-CM | POA: Diagnosis not present

## 2019-04-03 DIAGNOSIS — D649 Anemia, unspecified: Secondary | ICD-10-CM

## 2019-04-03 DIAGNOSIS — R739 Hyperglycemia, unspecified: Secondary | ICD-10-CM | POA: Diagnosis not present

## 2019-04-03 DIAGNOSIS — E559 Vitamin D deficiency, unspecified: Secondary | ICD-10-CM | POA: Diagnosis not present

## 2019-04-03 MED ORDER — METOPROLOL SUCCINATE ER 50 MG PO TB24: 50.0000 mg | ORAL_TABLET | Freq: Two times a day (BID) | ORAL | 0 refills | Status: DC

## 2019-04-03 MED FILL — METOPROLOL SUCCINATE ER 50: 50 | 90 days supply | Qty: 360 | Fill #0

## 2019-04-03 NOTE — Assessment & Plan Note (Signed)
bp log from home reviewed in chart and her numbers continue to run low. Will stop the HCTZ and she will continue to monitor. Report any concenrs.

## 2019-04-03 NOTE — Telephone Encounter (Signed)
Metoprolol was filled. FYI for you.

## 2019-04-03 NOTE — Assessment & Plan Note (Signed)
Supplement and monitor 

## 2019-04-03 NOTE — Assessment & Plan Note (Signed)
She has been seen by her gastroenterologist and they have repeated a hemoccult but she is unaware of the results. She has started iron supplements now and will need repeat cbc in 1-2 months.

## 2019-04-03 NOTE — Progress Notes (Signed)
Virtual Visit via Video Note  I connected with Carrie Elliott on 04/03/19 at 10:20 AM EST by a video enabled telemedicine application and verified that I am speaking with the correct person using two identifiers.  Location: Patient: home Provider: home   I discussed the limitations of evaluation and management by telemedicine and the availability of in person appointments. The patient expressed understanding and agreed to proceed. Carrie Elliott, CMA was able to get the patient set up on a visit, video   Subjective:    Patient ID: Carrie Elliott, female    DOB: October 21, 1950, 69 y.o.   MRN: [id number]  No chief complaint on file.   HPI Patient is in today for follow up on hypertension, anemia and more. She is doing well. No recent febrile illness or hospitalizations. She is still taking all 3 of her BP meds and she still has low systolic BPs in the [id number] at times. She notes fatigue but does not endorse syncope or dizziness. They are maintaining quarantine well. Denies CP/palp/SOB/HA/congestion/fevers/GI or GU c/o. Taking meds as prescribed  Past Medical History:  Diagnosis Date  . Allergic rhinitis   . Allergy   . Anemia 08/24/2014  . Arthritis    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, shoulders, knees  . Benign paroxysmal positional vertigo 07/12/2013  . Chicken pox   . Cystocele    grade 2  . Endometriosis    with Menometrorhaghia   . Gastric polyps 09/30/2015  . Gastroesophageal reflux disease with hiatal hernia    Cough Sees Dr Laretta Bolster   . GERD (gastroesophageal reflux disease)   . Hemorrhoids   . History of colonic polyps   . History of colonic polyps 09/30/2015   adenoma  . HTN (hypertension) 04/23/2013  . Hypocalcemia 09/30/2015  . Hyponatremia 11/23/2016  . IBS (irritable bowel syndrome)   . Measles   . Mumps   . Muscle spasm 09/30/2015  . Osteopenia 10/09/2015  . Rectocele    grade 2  . Reflux    cough  . Rosacea   . Sternal fracture    1986  . Sun-damaged skin 09/05/2014  . Vertigo 11/23/2016   . Vitamin D deficiency 04/06/2016  . 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 APPENDECTOMY  1989  . atypical nevus excision    . BREAST BIOPSY Right   . CHOLECYSTECTOMY  1989  . COLONOSCOPY  2011  . COLONOSCOPY WITH PROPOFOL N/A 05/27/2015   Procedure: COLONOSCOPY WITH PROPOFOL;  Surgeon: Clarene Essex, MD;  Location: [facility];  Service: Endoscopy;  Laterality: N/A;  . CYSTOSCOPY W/ URETERAL STENT REMOVAL  1999  . ESOPHAGOGASTRODUODENOSCOPY  2011   with esophageal stent, perforation   . ESOPHAGOGASTRODUODENOSCOPY (EGD) WITH PROPOFOL N/A 05/27/2015   Procedure: ESOPHAGOGASTRODUODENOSCOPY (EGD) WITH PROPOFOL;  Surgeon: Clarene Essex, MD;  Location: [facility];  Service: Endoscopy;  Laterality: N/A;  . HEMORRHOID SURGERY  2005  . LEFT OOPHORECTOMY  [id number]   complicated by ureteral injuery requiring a ureteral stent   . MMK / ANTERIOR VESICOURETHROPEXY / URETHROPEXY  2000  . TONSILLECTOMY AND ADENOIDECTOMY    . TOTAL ABDOMINAL HYSTERECTOMY  2000  . TUBAL LIGATION  1981  . wisdom teeth removal  1964    Family History  Problem Relation Age of Onset  . Colon polyps Father   . Hypertension Father   . Heart failure Father   . Melanoma Father        multiple  . Heart  attack Father        x2  . Heart disease Father        Bundle Block  . Meniere's disease Father   . Cancer Father        melanoma  . Osteoporosis Mother   . Transient ischemic attack Mother   . Cancer Mother        colon  . Heart disease Mother        afib  . Hyperlipidemia Mother   . Diabetes Paternal Grandmother   . Stroke Maternal Grandmother   . Cancer Maternal Grandmother        skin, melanoma, sarcoma  . Colon polyps Maternal Grandmother   . Diverticulitis Maternal Grandmother   . Stroke Maternal Grandfather   . Heart disease Maternal Grandfather   . Obesity Brother     Social History   Socioeconomic History  . Marital status: Married    Spouse name: Not on  file  . Number of children: Not on file  . Years of education: Not on file  . Highest education level: Not on file  Occupational History  . Not on file  Tobacco Use  . Smoking status: Never Smoker  . Smokeless tobacco: Never Used  Substance and Sexual Activity  . Alcohol use: No  . Drug use: No  . 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 Not on file  Social History Narrative  . Not on file   Social Determinants of Health   Financial Resource Strain:   . Difficulty of Paying Living Expenses: Not on file  Food Insecurity:   . Worried About Charity fundraiser in the Last Year: Not on file  . Ran Out of Food in the Last Year: Not on file  Transportation Needs:   . Lack of Transportation (Medical): Not on file  . Lack of Transportation (Non-Medical): Not on file  Physical Activity:   . Days of Exercise per Week: Not on file  . Minutes of Exercise per Session: Not on file  Stress:   . Feeling of Stress : Not on file  Social Connections:   . Frequency of Communication with Friends and Family: Not on file  . Frequency of Social Gatherings with Friends and Family: Not on file  . Attends Religious Services: Not on file  . Active Member of Clubs or Organizations: Not on file  . Attends Archivist Meetings: Not on file  . Marital Status: Not on file  Intimate Partner Violence:   . Fear of Current or Ex-Partner: Not on file  . Emotionally Abused: Not on file  . Physically Abused: Not on file  . Sexually Abused: Not on file    Outpatient Medications Prior to Visit  Medication Sig Dispense Refill  . acetaminophen (TYLENOL) 500 MG tablet Take 1,000 mg by mouth every 6 (six) hours as needed for headache (pain).    Marland Kitchen aspirin EC 81 MG tablet Take 81 mg by mouth daily.    . benzocaine (ORAJEL) 10 % mucosal gel Use as directed 1 application in the mouth or throat as needed for mouth pain.  Reported on 07/22/2015    . Cholecalciferol (VITAMIN D3) 50 MCG (2000 UT) TABS Take by mouth. Take 1 tablet daily Monday-Friday. Take 2 tablets on Saturday.    . esomeprazole (NEXIUM) 40 MG capsule   3  . famotidine (PEPCID) 20 MG tablet     .  Lidocaine-Hydrocortisone Ace 3-0.5 % CREA Apply 1 application topically daily as needed (hemmoriods.).     Marland Kitchen lisinopril (ZESTRIL) 10 MG tablet Take 1 tablet (10 mg total) by mouth 2 (two) times daily. 180 tablet 1  . SIMPLY SALINE NA Place 1 each into the nose daily.     . hydrochlorothiazide (MICROZIDE) 12.5 MG capsule Take 12.5 mg by mouth daily.    . metoprolol succinate (TOPROL-XL) 50 MG 24 hr tablet TAKE 1-2 TABLETS (50-100 MG TOTAL) BY MOUTH 2 (TWO) TIMES DAILY. TAKE WITH OR IMMEDIATELY FOLLOWING A MEAL. (Patient taking differently: Take 50-100 mg by mouth 2 (two) times daily. 50 mg in morning and 100 mg in evening) 360 tablet 0   No facility-administered medications prior to visit.    Allergies  Allergen Reactions  . Cefdinir     HIVES  . Celebrex [Celecoxib]     ABDOMINAL PAIN   . Ciprofloxacin     ABDOMINAL PAIN   . Codeine     NAUSEA VOMITING - can take with zofran  . Pseudoephedrine Hcl Er     TACHYCARDIA   . Singulair [Montelukast Sodium] Other (See Comments)    Mental status changes, irritable    Review of Systems  Constitutional: Positive for malaise/fatigue. Negative for fever.  HENT: Negative for congestion.   Eyes: Negative for blurred vision.  Respiratory: Negative for shortness of breath.   Cardiovascular: Negative for chest pain, palpitations and leg swelling.  Gastrointestinal: Negative for abdominal pain, blood in stool and nausea.  Genitourinary: Negative for dysuria and frequency.  Musculoskeletal: Negative for falls.  Skin: Negative for rash.  Neurological: Negative for dizziness, loss of consciousness and headaches.  Endo/Heme/Allergies: Negative for environmental allergies.  Psychiatric/Behavioral: Negative for  depression. The patient is not nervous/anxious.        Objective:    Physical Exam Constitutional:      Appearance: Normal appearance. She is not ill-appearing.  HENT:     Head: Normocephalic and atraumatic.     Nose: Nose normal.  Eyes:     General:        Right eye: No discharge.        Left eye: No discharge.  Pulmonary:     Effort: Pulmonary effort is normal.  Neurological:     Mental Status: She is alert and oriented to person, place, and time.  Psychiatric:        Behavior: Behavior normal.     BP (!) 81/63 (BP Location: Left Arm, Patient Position: Sitting, Cuff Size: Normal)   Wt 168 lb (76.2 kg)   BMI 31.74 kg/m  Wt Readings from Last 3 Encounters:  04/03/19 168 lb (76.2 kg)  02/26/19 169 lb 9.6 oz (76.9 kg)  01/01/19 166 lb 12.8 oz (75.7 kg)    Diabetic Foot Exam - Simple   No data filed     Lab Results  Component Value Date   WBC 5.5 12/18/2018   HGB 11.2 (L) 12/18/2018   HCT 34.5 (L) 12/18/2018   PLT 288.0 12/18/2018   GLUCOSE 88 12/18/2018   CHOL 207 (H) 12/18/2018   TRIG 128.0 12/18/2018   HDL 57.40 12/18/2018   LDLDIRECT 106.0 02/18/2018   LDLCALC 124 (H) 12/18/2018   ALT 14 12/18/2018   AST 15 12/18/2018   NA 137 12/18/2018   K 3.9 12/18/2018   CL 101 12/18/2018   CREATININE 0.76 12/18/2018   BUN 16 12/18/2018   CO2 29 12/18/2018   TSH 1.20 12/18/2018   INR  1.19 02/07/2010   HGBA1C 5.9 12/18/2018    Lab Results  Component Value Date   TSH 1.20 12/18/2018   Lab Results  Component Value Date   WBC 5.5 12/18/2018   HGB 11.2 (L) 12/18/2018   HCT 34.5 (L) 12/18/2018   MCV 81.8 12/18/2018   PLT 288.0 12/18/2018   Lab Results  Component Value Date   NA 137 12/18/2018   K 3.9 12/18/2018   CO2 29 12/18/2018   GLUCOSE 88 12/18/2018   BUN 16 12/18/2018   CREATININE 0.76 12/18/2018   BILITOT 0.4 12/18/2018   ALKPHOS 59 12/18/2018   AST 15 12/18/2018   ALT 14 12/18/2018   PROT 6.9 12/18/2018   ALBUMIN 4.1 12/18/2018   CALCIUM  9.4 12/18/2018   ANIONGAP 8 05/31/2016   GFR 75.63 12/18/2018   Lab Results  Component Value Date   CHOL 207 (H) 12/18/2018   Lab Results  Component Value Date   HDL 57.40 12/18/2018   Lab Results  Component Value Date   LDLCALC 124 (H) 12/18/2018   Lab Results  Component Value Date   TRIG 128.0 12/18/2018   Lab Results  Component Value Date   CHOLHDL 4 12/18/2018   Lab Results  Component Value Date   HGBA1C 5.9 12/18/2018       Assessment & Plan:   Problem List Items Addressed This Visit    HTN (hypertension) (Chronic)    bp log from home reviewed in chart and her numbers continue to run low. Will stop the HCTZ and she will continue to monitor. Report any concenrs.       Relevant Medications   metoprolol succinate (TOPROL-XL) 50 MG 24 hr tablet   Anemia    She has been seen by her gastroenterologist and they have repeated a hemoccult but she is unaware of the results. She has started iron supplements now and will need repeat cbc in 1-2 months.       Vitamin D deficiency    Supplement and monitor      Hyperglycemia    hgba1c acceptable, minimize simple carbs. Increase exercise as tolerated.       Vitamin B12 deficiency    Supplement and monitor         I have discontinued Christean Grief. Brazier's hydrochlorothiazide. I am also having her maintain her acetaminophen, benzocaine, Lidocaine-Hydrocortisone Ace, SIMPLY SALINE NA, famotidine, esomeprazole, lisinopril, aspirin EC, Vitamin D3, and metoprolol succinate.  Meds ordered this encounter  Medications  . metoprolol succinate (TOPROL-XL) 50 MG 24 hr tablet    Sig: Take 1-2 tablets (50-100 mg total) by mouth 2 (two) times daily. Take with or immediately following a meal.    Dispense:  360 tablet    Refill:  0     I discussed the assessment and treatment plan with the patient. The patient was provided an opportunity to ask questions and all were answered. The patient agreed with the plan and demonstrated an  understanding of the instructions.   The patient was advised to call back or seek an in-person evaluation if the symptoms worsen or if the condition fails to improve as anticipated.  I provided 25 minutes of non-face-to-face time during this encounter.   Penni Homans, MD

## 2019-04-03 NOTE — Assessment & Plan Note (Signed)
hgba1c acceptable, minimize simple carbs. Increase exercise as tolerated.  

## 2019-04-13 ENCOUNTER — Telehealth: Payer: Self-pay | Admitting: Family Medicine

## 2019-04-13 NOTE — Telephone Encounter (Signed)
She does need an appointment in February sometime

## 2019-04-13 NOTE — Telephone Encounter (Signed)
Copied from [place] [id number]. Topic: Appointment Scheduling - Scheduling Inquiry for Clinic >> Apr 13, 2019  3:43 PM Greggory Keen D wrote: Reason for CRM: Pt called saying she needs to schedule an appt.  She seen Dr Charlett Blake on the 8 and she ask her to schedule a FU 4 weeks.  CB#  [phone]

## 2019-04-14 NOTE — Telephone Encounter (Signed)
Please schedule patient sometime in February

## 2019-04-23 NOTE — Telephone Encounter (Signed)
Pt scheduled appt thry MyChart for 2/15

## 2019-04-25 ENCOUNTER — Encounter: Payer: Self-pay | Admitting: Family Medicine

## 2019-05-07 MED FILL — HYDROCHLOROTHIAZIDE 12.5 MG: 12.5 | 90 days supply | Qty: 90 | Fill #1

## 2019-05-11 ENCOUNTER — Other Ambulatory Visit: Payer: Self-pay

## 2019-05-11 ENCOUNTER — Ambulatory Visit: Payer: Medicare Other | Admitting: Family Medicine

## 2019-05-11 ENCOUNTER — Encounter: Payer: Self-pay | Admitting: Family Medicine

## 2019-05-11 ENCOUNTER — Ambulatory Visit (INDEPENDENT_AMBULATORY_CARE_PROVIDER_SITE_OTHER): Payer: Medicare Other | Admitting: Family Medicine

## 2019-05-11 VITALS — BP 137/81 | HR 69 | Temp 98.7°F | Ht 61.0 in | Wt 168.0 lb

## 2019-05-11 DIAGNOSIS — E538 Deficiency of other specified B group vitamins: Secondary | ICD-10-CM

## 2019-05-11 DIAGNOSIS — R739 Hyperglycemia, unspecified: Secondary | ICD-10-CM

## 2019-05-11 DIAGNOSIS — D649 Anemia, unspecified: Secondary | ICD-10-CM

## 2019-05-11 DIAGNOSIS — I10 Essential (primary) hypertension: Secondary | ICD-10-CM

## 2019-05-11 DIAGNOSIS — E785 Hyperlipidemia, unspecified: Secondary | ICD-10-CM

## 2019-05-11 DIAGNOSIS — E559 Vitamin D deficiency, unspecified: Secondary | ICD-10-CM

## 2019-05-11 DIAGNOSIS — R350 Frequency of micturition: Secondary | ICD-10-CM

## 2019-05-11 DIAGNOSIS — K317 Polyp of stomach and duodenum: Secondary | ICD-10-CM | POA: Diagnosis not present

## 2019-05-11 NOTE — Assessment & Plan Note (Signed)
Supplement and monitor 

## 2019-05-11 NOTE — Assessment & Plan Note (Signed)
Encouraged heart healthy diet, increase exercise, avoid trans fats, consider a krill oil cap daily 

## 2019-05-11 NOTE — Patient Instructions (Signed)
[facility] Blood Pressure cuff, upper arm, want BP 100-140/60-90 Pulse oximeter, want oxygen in 90s  Weekly vitals  Take Multivitamin with minerals, selenium Vitamin D 1000-2000 IU daily Probiotic with lactobacillus and bifidophilus Asprin EC 81 mg daily  Melatonin 2-5 mg at bedtime  [email or web address] 

## 2019-05-11 NOTE — Assessment & Plan Note (Signed)
Well controlled, no changes to meds. Encouraged heart healthy diet such as the DASH diet and exercise as tolerated. She had stopped her HCTZ but then her numbers trended up again. She is now taking it again and her numbers have remained wnl. She is encouraged to continue taking and report any changes.

## 2019-05-11 NOTE — Assessment & Plan Note (Signed)
hgba1c acceptable, minimize simple carbs. Increase exercise as tolerated.  

## 2019-05-11 NOTE — Progress Notes (Signed)
Virtual Visit via Video Note  I connected with Carrie Elliott on 05/11/19 at  2:20 PM EST by a video enabled telemedicine application and verified that I am speaking with the correct person using two identifiers.  Location: Patient: home Provider: office   I discussed the limitations of evaluation and management by telemedicine and the availability of in person appointments. The patient expressed understanding and agreed to proceed. Marin Roberts, CMA was able to get the patient set up on a visit, video   Subjective:    Patient ID: Carrie Elliott, female    DOB: [birth year], 69 y.o.   MRN: [id number]  Chief Complaint  Patient presents with  . Hypertension    follow up    HPI Patient is in today for follow up on chronic medical concerns. She is at home accompanied by her husband. No recent febrile illness or hospitalizations. No acute concerns. She does get intermittent abdominal discomfort but her bowels are moving OK. Denies CP/palp/SOB/HA/congestion/fevers/GI or GU c/o. Taking meds as prescribed  Past Medical History:  Diagnosis Date  . Allergic rhinitis   . Allergy   . Anemia 08/24/2014  . Arthritis    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, shoulders, knees  . Benign paroxysmal positional vertigo 07/12/2013  . Chicken pox   . Cystocele    grade 2  . Endometriosis    with Menometrorhaghia   . Gastric polyps 09/30/2015  . Gastroesophageal reflux disease with hiatal hernia    Cough Sees Dr Laretta Bolster   . GERD (gastroesophageal reflux disease)   . Hemorrhoids   . History of colonic polyps   . History of colonic polyps 09/30/2015   adenoma  . HTN (hypertension) 04/23/2013  . Hypocalcemia 09/30/2015  . Hyponatremia 11/23/2016  . IBS (irritable bowel syndrome)   . Measles   . Mumps   . Muscle spasm 09/30/2015  . Osteopenia 10/09/2015  . Rectocele    grade 2  . Reflux    cough  . Rosacea   . Sternal fracture    1986  . Sun-damaged skin 09/05/2014  . Vertigo 11/23/2016  . Vitamin D deficiency 04/06/2016  .  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 APPENDECTOMY  1989  . atypical nevus excision    . BREAST BIOPSY Right   . CHOLECYSTECTOMY  1989  . COLONOSCOPY  2011  . COLONOSCOPY WITH PROPOFOL N/A 05/27/2015   Procedure: COLONOSCOPY WITH PROPOFOL;  Surgeon: Clarene Essex, MD;  Location: [facility];  Service: Endoscopy;  Laterality: N/A;  . CYSTOSCOPY W/ URETERAL STENT REMOVAL  1999  . ESOPHAGOGASTRODUODENOSCOPY  2011   with esophageal stent, perforation   . ESOPHAGOGASTRODUODENOSCOPY (EGD) WITH PROPOFOL N/A 05/27/2015   Procedure: ESOPHAGOGASTRODUODENOSCOPY (EGD) WITH PROPOFOL;  Surgeon: Clarene Essex, MD;  Location: [facility];  Service: Endoscopy;  Laterality: N/A;  . HEMORRHOID SURGERY  2005  . LEFT OOPHORECTOMY  [id number]   complicated by ureteral injuery requiring a ureteral stent   . MMK / ANTERIOR VESICOURETHROPEXY / URETHROPEXY  2000  . TONSILLECTOMY AND ADENOIDECTOMY    . TOTAL ABDOMINAL HYSTERECTOMY  2000  . TUBAL LIGATION  1981  . wisdom teeth removal  1964    Family History  Problem Relation Age of Onset  . Colon polyps Father   . Hypertension Father   . Heart failure Father   . Melanoma Father        multiple  . Heart attack Father  x2  . Heart disease Father        Bundle Block  . Meniere's disease Father   . Cancer Father        melanoma  . Osteoporosis Mother   . Transient ischemic attack Mother   . Cancer Mother        colon  . Heart disease Mother        afib  . Hyperlipidemia Mother   . Diabetes Paternal Grandmother   . Stroke Maternal Grandmother   . Cancer Maternal Grandmother        skin, melanoma, sarcoma  . Colon polyps Maternal Grandmother   . Diverticulitis Maternal Grandmother   . Stroke Maternal Grandfather   . Heart disease Maternal Grandfather   . Obesity Brother     Social History   Socioeconomic History  . Marital status: Married    Spouse name: Not on file  . Number of children: Not on file   . Years of education: Not on file  . Highest education level: Not on file  Occupational History  . Not on file  Tobacco Use  . Smoking status: Never Smoker  . Smokeless tobacco: Never Used  Substance and Sexual Activity  . Alcohol use: No  . Drug use: No  . 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 Not on file  Social History Narrative  . Not on file   Social Determinants of Health   Financial Resource Strain:   . Difficulty of Paying Living Expenses: Not on file  Food Insecurity:   . Worried About Charity fundraiser in the Last Year: Not on file  . Ran Out of Food in the Last Year: Not on file  Transportation Needs:   . Lack of Transportation (Medical): Not on file  . Lack of Transportation (Non-Medical): Not on file  Physical Activity:   . Days of Exercise per Week: Not on file  . Minutes of Exercise per Session: Not on file  Stress:   . Feeling of Stress : Not on file  Social Connections:   . Frequency of Communication with Friends and Family: Not on file  . Frequency of Social Gatherings with Friends and Family: Not on file  . Attends Religious Services: Not on file  . Active Member of Clubs or Organizations: Not on file  . Attends Archivist Meetings: Not on file  . Marital Status: Not on file  Intimate Partner Violence:   . Fear of Current or Ex-Partner: Not on file  . Emotionally Abused: Not on file  . Physically Abused: Not on file  . Sexually Abused: Not on file    Outpatient Medications Prior to Visit  Medication Sig Dispense Refill  . acetaminophen (TYLENOL) 500 MG tablet Take 1,000 mg by mouth every 6 (six) hours as needed for headache (pain).    Marland Kitchen aspirin EC 81 MG tablet Take 81 mg by mouth daily.    . benzocaine (ORAJEL) 10 % mucosal gel Use as directed 1 application in the mouth or throat as needed for mouth pain. Reported on 07/22/2015    . Cholecalciferol  (VITAMIN D3) 50 MCG (2000 UT) TABS Take by mouth. Take 1 tablet daily Monday-Friday. Take 2 tablets on Saturday.    . esomeprazole (NEXIUM) 40 MG capsule   3  . famotidine (PEPCID) 20 MG tablet     . Lidocaine-Hydrocortisone Ace 3-0.5 % CREA  Apply 1 application topically daily as needed (hemmoriods.).     Marland Kitchen lisinopril (ZESTRIL) 10 MG tablet Take 1 tablet (10 mg total) by mouth 2 (two) times daily. 180 tablet 1  . metoprolol succinate (TOPROL-XL) 50 MG 24 hr tablet Take 1-2 tablets (50-100 mg total) by mouth 2 (two) times daily. Take with or immediately following a meal. 360 tablet 0  . SIMPLY SALINE NA Place 1 each into the nose daily.      No facility-administered medications prior to visit.    Allergies  Allergen Reactions  . Cefdinir     HIVES  . Celebrex [Celecoxib]     ABDOMINAL PAIN   . Ciprofloxacin     ABDOMINAL PAIN   . Codeine     NAUSEA VOMITING - can take with zofran  . Pseudoephedrine Hcl Er     TACHYCARDIA   . Singulair [Montelukast Sodium] Other (See Comments)    Mental status changes, irritable    Review of Systems  Constitutional: Positive for malaise/fatigue. Negative for fever.  HENT: Negative for congestion.   Eyes: Negative for blurred vision.  Respiratory: Negative for shortness of breath.   Cardiovascular: Negative for chest pain, palpitations and leg swelling.  Gastrointestinal: Positive for abdominal pain. Negative for blood in stool and nausea.  Genitourinary: Negative for dysuria and frequency.  Musculoskeletal: Negative for falls.  Skin: Negative for rash.  Neurological: Negative for dizziness, loss of consciousness and headaches.  Endo/Heme/Allergies: Negative for environmental allergies.  Psychiatric/Behavioral: Negative for depression. The patient is not nervous/anxious.        Objective:    Physical Exam Constitutional:      Appearance: Normal appearance. She is not ill-appearing.  HENT:     Head: Normocephalic and atraumatic.      Right Ear: External ear normal.     Left Ear: External ear normal.  Eyes:     General:        Right eye: No discharge.        Left eye: No discharge.  Pulmonary:     Effort: Pulmonary effort is normal.  Neurological:     Mental Status: She is alert and oriented to person, place, and time.  Psychiatric:        Behavior: Behavior normal.     BP 137/81 (BP Location: Left Arm)   Pulse 69   Temp 98.7 F (37.1 C)   Ht 5\' 1"  (1.549 m)   Wt 168 lb (76.2 kg)   SpO2 98%   BMI 31.74 kg/m  Wt Readings from Last 3 Encounters:  05/11/19 168 lb (76.2 kg)  04/03/19 168 lb (76.2 kg)  02/26/19 169 lb 9.6 oz (76.9 kg)    Diabetic Foot Exam - Simple   No data filed     Lab Results  Component Value Date   WBC 5.5 12/18/2018   HGB 11.2 (L) 12/18/2018   HCT 34.5 (L) 12/18/2018   PLT 288.0 12/18/2018   GLUCOSE 88 12/18/2018   CHOL 207 (H) 12/18/2018   TRIG 128.0 12/18/2018   HDL 57.40 12/18/2018   LDLDIRECT 106.0 02/18/2018   LDLCALC 124 (H) 12/18/2018   ALT 14 12/18/2018   AST 15 12/18/2018   NA 137 12/18/2018   K 3.9 12/18/2018   CL 101 12/18/2018   CREATININE 0.76 12/18/2018   BUN 16 12/18/2018   CO2 29 12/18/2018   TSH 1.20 12/18/2018   INR 1.19 02/07/2010   HGBA1C 5.9 12/18/2018    Lab Results  Component Value  Date   TSH 1.20 12/18/2018   Lab Results  Component Value Date   WBC 5.5 12/18/2018   HGB 11.2 (L) 12/18/2018   HCT 34.5 (L) 12/18/2018   MCV 81.8 12/18/2018   PLT 288.0 12/18/2018   Lab Results  Component Value Date   NA 137 12/18/2018   K 3.9 12/18/2018   CO2 29 12/18/2018   GLUCOSE 88 12/18/2018   BUN 16 12/18/2018   CREATININE 0.76 12/18/2018   BILITOT 0.4 12/18/2018   ALKPHOS 59 12/18/2018   AST 15 12/18/2018   ALT 14 12/18/2018   PROT 6.9 12/18/2018   ALBUMIN 4.1 12/18/2018   CALCIUM 9.4 12/18/2018   ANIONGAP 8 05/31/2016   GFR 75.63 12/18/2018   Lab Results  Component Value Date   CHOL 207 (H) 12/18/2018   Lab Results  Component  Value Date   HDL 57.40 12/18/2018   Lab Results  Component Value Date   LDLCALC 124 (H) 12/18/2018   Lab Results  Component Value Date   TRIG 128.0 12/18/2018   Lab Results  Component Value Date   CHOLHDL 4 12/18/2018   Lab Results  Component Value Date   HGBA1C 5.9 12/18/2018       Assessment & Plan:   Problem List Items Addressed This Visit    HTN (hypertension) - Primary (Chronic)    Well controlled, no changes to meds. Encouraged heart healthy diet such as the DASH diet and exercise as tolerated. She had stopped her HCTZ but then her numbers trended up again. She is now taking it again and her numbers have remained wnl. She is encouraged to continue taking and report any changes.       Relevant Orders   Comprehensive metabolic panel   TSH   Anemia   Relevant Orders   CBC   Iron, TIBC and Ferritin Panel   Urinary frequency   Gastric polyps    Has been instructed to complete next colonoscopy at the 3 year mark      Vitamin D deficiency    Supplement and monitor      Relevant Orders   VITAMIN D 25 Hydroxy (Vit-D Deficiency, Fractures)   Hyperlipidemia    Encouraged heart healthy diet, increase exercise, avoid trans fats, consider a krill oil cap daily      Relevant Orders   Lipid panel   Hyperglycemia    hgba1c acceptable, minimize simple carbs. Increase exercise as tolerated.       Relevant Orders   Hemoglobin A1c   Vitamin B12 deficiency    Supplement and monitor         I am having Christean Grief. Faraj maintain her acetaminophen, benzocaine, Lidocaine-Hydrocortisone Ace, SIMPLY SALINE NA, famotidine, esomeprazole, lisinopril, aspirin EC, Vitamin D3, and metoprolol succinate.  No orders of the defined types were placed in this encounter.     I discussed the assessment and treatment plan with the patient. The patient was provided an opportunity to ask questions and all were answered. The patient agreed with the plan and demonstrated an understanding  of the instructions.   The patient was advised to call back or seek an in-person evaluation if the symptoms worsen or if the condition fails to improve as anticipated.  I provided 25 minutes of non-face-to-face time during this encounter.   Penni Homans, MD

## 2019-05-11 NOTE — Assessment & Plan Note (Signed)
Has been instructed to complete next colonoscopy at the 3 year mark

## 2019-05-13 MED FILL — LISINOPRIL 10 MG TABS: 10 | 90 days supply | Qty: 180 | Fill #1

## 2019-05-25 ENCOUNTER — Other Ambulatory Visit: Payer: Self-pay | Admitting: Family Medicine

## 2019-05-25 ENCOUNTER — Other Ambulatory Visit (INDEPENDENT_AMBULATORY_CARE_PROVIDER_SITE_OTHER): Payer: Medicare Other

## 2019-05-25 ENCOUNTER — Encounter: Payer: Self-pay | Admitting: Family Medicine

## 2019-05-25 ENCOUNTER — Other Ambulatory Visit: Payer: Self-pay

## 2019-05-25 DIAGNOSIS — K625 Hemorrhage of anus and rectum: Secondary | ICD-10-CM

## 2019-05-25 DIAGNOSIS — I10 Essential (primary) hypertension: Secondary | ICD-10-CM | POA: Diagnosis not present

## 2019-05-25 DIAGNOSIS — D649 Anemia, unspecified: Secondary | ICD-10-CM

## 2019-05-25 DIAGNOSIS — R739 Hyperglycemia, unspecified: Secondary | ICD-10-CM | POA: Diagnosis not present

## 2019-05-25 DIAGNOSIS — E785 Hyperlipidemia, unspecified: Secondary | ICD-10-CM

## 2019-05-25 DIAGNOSIS — E559 Vitamin D deficiency, unspecified: Secondary | ICD-10-CM | POA: Diagnosis not present

## 2019-05-25 LAB — LIPID PANEL
Cholesterol: 189 mg/dL (ref 0–200)
HDL: 52.8 mg/dL (ref >39.00)
LDL Cholesterol: 109 mg/dL — ABNORMAL HIGH (ref 0–99)
NonHDL: 136.6
Total CHOL/HDL Ratio: 4
Triglycerides: 136 mg/dL (ref 0.0–149.0)
VLDL: 27.2 mg/dL (ref 0.0–40.0)

## 2019-05-25 LAB — CBC
HCT: 29 % — ABNORMAL LOW (ref 36.0–46.0)
Hemoglobin: 9.3 g/dL — ABNORMAL LOW (ref 12.0–15.0)
MCHC: 31.8 g/dL (ref 30.0–36.0)
MCV: 78.5 fl (ref 78.0–100.0)
Platelets: 279 10*3/uL (ref 150.0–400.0)
RBC: 3.69 Mil/uL — ABNORMAL LOW (ref 3.87–5.11)
RDW: 15.9 % — ABNORMAL HIGH (ref 11.5–15.5)
WBC: 5.8 10*3/uL (ref 4.0–10.5)

## 2019-05-25 LAB — COMPREHENSIVE METABOLIC PANEL
ALT: 10 U/L (ref 0–35)
AST: 13 U/L (ref 0–37)
Albumin: 3.9 g/dL (ref 3.5–5.2)
Alkaline Phosphatase: 52 U/L (ref 39–117)
BUN: 22 mg/dL (ref 6–23)
CO2: 27 mEq/L (ref 19–32)
Calcium: 9.4 mg/dL (ref 8.4–10.5)
Chloride: 101 mEq/L (ref 96–112)
Creatinine, Ser: 0.99 mg/dL (ref 0.40–1.20)
GFR: 55.67 mL/min — ABNORMAL LOW (ref >60.00)
Glucose, Bld: 86 mg/dL (ref 70–99)
Potassium: 4.1 mEq/L (ref 3.5–5.1)
Sodium: 137 mEq/L (ref 135–145)
Total Bilirubin: 0.3 mg/dL (ref 0.2–1.2)
Total Protein: 6.9 g/dL (ref 6.0–8.3)

## 2019-05-25 LAB — HEMOGLOBIN A1C: Hgb A1c MFr Bld: 5.6 % (ref 4.6–6.5)

## 2019-05-25 LAB — TSH: TSH: 1.29 u[IU]/mL (ref 0.35–4.50)

## 2019-05-25 LAB — VITAMIN D 25 HYDROXY (VIT D DEFICIENCY, FRACTURES): VITD: 48.3 ng/mL (ref 30.00–100.00)

## 2019-05-25 MED ORDER — HEMOCYTE-F 324-1 MG PO TABS: 1.0000 | ORAL_TABLET | Freq: Every day | ORAL | 2 refills | Status: DC

## 2019-05-26 ENCOUNTER — Other Ambulatory Visit: Payer: Self-pay | Admitting: Family Medicine

## 2019-05-26 DIAGNOSIS — D509 Iron deficiency anemia, unspecified: Secondary | ICD-10-CM

## 2019-05-26 LAB — IRON,TIBC AND FERRITIN PANEL
%SAT: 7 % (calc) — ABNORMAL LOW (ref 16–45)
Ferritin: 10 ng/mL — ABNORMAL LOW (ref 16–288)
Iron: 30 ug/dL — ABNORMAL LOW (ref 45–160)
TIBC: 412 mcg/dL (calc) (ref 250–450)

## 2019-05-26 MED FILL — HEMATINIC-FOLIC ACID TABLET: 324-1 | 30 days supply | Qty: 30 | Fill #0

## 2019-05-26 NOTE — Addendum Note (Signed)
Addended by: Magdalene Molly A on: 05/26/2019 08:32 AM   Modules accepted: Orders

## 2019-05-26 NOTE — Telephone Encounter (Signed)
Pt walked into the office and signed release of info for [place] to fax lab results to Dr. Watt Climes - I have faxed results from 05/25/2019 to him at [phone]. Pt was advised that referral to GI will be cancelled and she will be advised if appt is needed with Dr. Watt Climes at [place] following her stool sample results.   Pt advised referral was sent to Hematology at [facility] and they will contact her to schedule.  Pt asking why she was referred to Cardiology. She is asking for notification about this referral. She has previously seen Dr. Daneen Schick at [facility] (in 2015). Please contact her at [phone].

## 2019-05-27 ENCOUNTER — Telehealth: Payer: Self-pay

## 2019-05-27 ENCOUNTER — Other Ambulatory Visit: Payer: Medicare Other

## 2019-05-27 ENCOUNTER — Telehealth: Payer: Self-pay | Admitting: Hematology & Oncology

## 2019-05-27 ENCOUNTER — Other Ambulatory Visit: Payer: Self-pay

## 2019-05-27 NOTE — Telephone Encounter (Signed)
Spoke with patient regarding referrals (GI, Cardiology and Hematology).  Patient states she does not need GI referral d/t having colonoscopy and endoscopy in November 2020.  She has seen Cardiology (Dr. Tamala Julian) in the past, but does not feel that she needs referral. States she is not having issues, her BP is being controlled by PCP.  Patient states the only referral she feels she needs is for Hematology.    Dr. Charlett Blake- I did speak to the person that entered the referral for Cardiology. She states order was placed yesterday by you, which she changed to [facility] office.   Okay to cancel referrals to GI and Cardiology?

## 2019-05-27 NOTE — Telephone Encounter (Signed)
Spoke with patient to confirm new patient appointment 3/18 at 10 am

## 2019-05-28 ENCOUNTER — Other Ambulatory Visit (INDEPENDENT_AMBULATORY_CARE_PROVIDER_SITE_OTHER): Payer: Medicare Other

## 2019-05-28 DIAGNOSIS — K625 Hemorrhage of anus and rectum: Secondary | ICD-10-CM

## 2019-05-28 DIAGNOSIS — D649 Anemia, unspecified: Secondary | ICD-10-CM | POA: Diagnosis not present

## 2019-05-28 LAB — FECAL OCCULT BLOOD, IMMUNOCHEMICAL: Fecal Occult Bld: POSITIVE — AB

## 2019-05-29 ENCOUNTER — Encounter: Payer: Self-pay | Admitting: Family Medicine

## 2019-05-29 ENCOUNTER — Other Ambulatory Visit: Payer: Self-pay | Admitting: Family Medicine

## 2019-05-29 DIAGNOSIS — R195 Other fecal abnormalities: Secondary | ICD-10-CM

## 2019-05-29 DIAGNOSIS — D500 Iron deficiency anemia secondary to blood loss (chronic): Secondary | ICD-10-CM

## 2019-06-01 MED FILL — ESOMEPRAZOLE MAG DR 40 MG C: 40 | 30 days supply | Qty: 30 | Fill #3

## 2019-06-02 ENCOUNTER — Other Ambulatory Visit: Payer: Self-pay

## 2019-06-02 ENCOUNTER — Other Ambulatory Visit (INDEPENDENT_AMBULATORY_CARE_PROVIDER_SITE_OTHER): Payer: Medicare Other

## 2019-06-02 DIAGNOSIS — D649 Anemia, unspecified: Secondary | ICD-10-CM | POA: Diagnosis not present

## 2019-06-02 LAB — CBC
HCT: 30.1 % — ABNORMAL LOW (ref 36.0–46.0)
Hemoglobin: 9.7 g/dL — ABNORMAL LOW (ref 12.0–15.0)
MCHC: 32.1 g/dL (ref 30.0–36.0)
MCV: 78.8 fl (ref 78.0–100.0)
Platelets: 293 10*3/uL (ref 150.0–400.0)
RBC: 3.82 Mil/uL — ABNORMAL LOW (ref 3.87–5.11)
RDW: 17.1 % — ABNORMAL HIGH (ref 11.5–15.5)
WBC: 7 10*3/uL (ref 4.0–10.5)

## 2019-06-02 LAB — FERRITIN: Ferritin: 9.7 ng/mL — ABNORMAL LOW (ref 10.0–291.0)

## 2019-06-03 LAB — RETICULOCYTES: Retic Ct Pct: 1.7 % (ref 0.6–2.6)

## 2019-06-11 ENCOUNTER — Other Ambulatory Visit: Payer: Self-pay

## 2019-06-11 ENCOUNTER — Encounter: Payer: Self-pay | Admitting: Hematology & Oncology

## 2019-06-11 ENCOUNTER — Inpatient Hospital Stay: Payer: Medicare Other

## 2019-06-11 ENCOUNTER — Inpatient Hospital Stay: Payer: Medicare Other | Attending: Hematology & Oncology | Admitting: Hematology & Oncology

## 2019-06-11 VITALS — BP 135/77 | HR 62 | Temp 96.8°F | Resp 18 | Ht 61.0 in | Wt 175.0 lb

## 2019-06-11 DIAGNOSIS — K909 Intestinal malabsorption, unspecified: Secondary | ICD-10-CM | POA: Insufficient documentation

## 2019-06-11 DIAGNOSIS — K589 Irritable bowel syndrome without diarrhea: Secondary | ICD-10-CM | POA: Diagnosis not present

## 2019-06-11 DIAGNOSIS — M129 Arthropathy, unspecified: Secondary | ICD-10-CM | POA: Insufficient documentation

## 2019-06-11 DIAGNOSIS — D500 Iron deficiency anemia secondary to blood loss (chronic): Secondary | ICD-10-CM | POA: Diagnosis not present

## 2019-06-11 DIAGNOSIS — D509 Iron deficiency anemia, unspecified: Secondary | ICD-10-CM | POA: Diagnosis not present

## 2019-06-11 DIAGNOSIS — K219 Gastro-esophageal reflux disease without esophagitis: Secondary | ICD-10-CM | POA: Diagnosis not present

## 2019-06-11 DIAGNOSIS — H8110 Benign paroxysmal vertigo, unspecified ear: Secondary | ICD-10-CM | POA: Insufficient documentation

## 2019-06-11 DIAGNOSIS — Z800 Family history of malignant neoplasm of digestive organs: Secondary | ICD-10-CM | POA: Insufficient documentation

## 2019-06-11 DIAGNOSIS — Z86010 Personal history of colonic polyps: Secondary | ICD-10-CM | POA: Diagnosis not present

## 2019-06-11 DIAGNOSIS — Z79899 Other long term (current) drug therapy: Secondary | ICD-10-CM | POA: Insufficient documentation

## 2019-06-11 DIAGNOSIS — M8580 Other specified disorders of bone density and structure, unspecified site: Secondary | ICD-10-CM | POA: Diagnosis not present

## 2019-06-11 DIAGNOSIS — I10 Essential (primary) hypertension: Secondary | ICD-10-CM | POA: Insufficient documentation

## 2019-06-11 DIAGNOSIS — E871 Hypo-osmolality and hyponatremia: Secondary | ICD-10-CM | POA: Insufficient documentation

## 2019-06-11 DIAGNOSIS — D508 Other iron deficiency anemias: Secondary | ICD-10-CM | POA: Diagnosis not present

## 2019-06-11 HISTORY — DX: Iron deficiency anemia, unspecified: D50.9

## 2019-06-11 HISTORY — DX: Intestinal malabsorption, unspecified: K90.9

## 2019-06-11 LAB — CBC WITH DIFFERENTIAL (CANCER CENTER ONLY)
Abs Immature Granulocytes: 0.05 10*3/uL (ref 0.00–0.07)
Basophils Absolute: 0.1 10*3/uL (ref 0.0–0.1)
Basophils Relative: 1 %
Eosinophils Absolute: 0.3 10*3/uL (ref 0.0–0.5)
Eosinophils Relative: 5 %
HCT: 30.4 % — ABNORMAL LOW (ref 36.0–46.0)
Hemoglobin: 9 g/dL — ABNORMAL LOW (ref 12.0–15.0)
Immature Granulocytes: 1 %
Lymphocytes Relative: 26 %
Lymphs Abs: 1.5 10*3/uL (ref 0.7–4.0)
MCH: 24.7 pg — ABNORMAL LOW (ref 26.0–34.0)
MCHC: 29.6 g/dL — ABNORMAL LOW (ref 30.0–36.0)
MCV: 83.5 fL (ref 80.0–100.0)
Monocytes Absolute: 0.5 10*3/uL (ref 0.1–1.0)
Monocytes Relative: 8 %
Neutro Abs: 3.5 10*3/uL (ref 1.7–7.7)
Neutrophils Relative %: 59 %
Platelet Count: 269 10*3/uL (ref 150–400)
RBC: 3.64 MIL/uL — ABNORMAL LOW (ref 3.87–5.11)
RDW: 16.6 % — ABNORMAL HIGH (ref 11.5–15.5)
WBC Count: 5.9 10*3/uL (ref 4.0–10.5)
nRBC: 0 % (ref 0.0–0.2)

## 2019-06-11 LAB — CMP (CANCER CENTER ONLY)
ALT: 10 U/L (ref 0–44)
AST: 12 U/L — ABNORMAL LOW (ref 15–41)
Albumin: 4.1 g/dL (ref 3.5–5.0)
Alkaline Phosphatase: 43 U/L (ref 38–126)
Anion gap: 7 (ref 5–15)
BUN: 21 mg/dL (ref 8–23)
CO2: 25 mmol/L (ref 22–32)
Calcium: 9.2 mg/dL (ref 8.9–10.3)
Chloride: 106 mmol/L (ref 98–111)
Creatinine: 0.9 mg/dL (ref 0.44–1.00)
GFR, Est AFR Am: >60 mL/min (ref >60)
GFR, Estimated: >60 mL/min (ref >60)
Glucose, Bld: 119 mg/dL — ABNORMAL HIGH (ref 70–99)
Potassium: 3.7 mmol/L (ref 3.5–5.1)
Sodium: 138 mmol/L (ref 135–145)
Total Bilirubin: 0.2 mg/dL — ABNORMAL LOW (ref 0.3–1.2)
Total Protein: 6.6 g/dL (ref 6.5–8.1)

## 2019-06-11 LAB — RETICULOCYTES
Immature Retic Fract: 17.2 % — ABNORMAL HIGH (ref 2.3–15.9)
RBC.: 3.66 MIL/uL — ABNORMAL LOW (ref 3.87–5.11)
Retic Count, Absolute: 56 10*3/uL (ref 19.0–186.0)
Retic Ct Pct: 1.5 % (ref 0.4–3.1)

## 2019-06-11 LAB — SAVE SMEAR(SSMR), FOR PROVIDER SLIDE REVIEW

## 2019-06-11 NOTE — Progress Notes (Signed)
Referral MD  Reason for Referral: Iron Deficiency Anemia -- Iron malabsorption  Chief Complaint  Patient presents with  . New Patient (Initial Visit)  : My iron has been low and I am more anemic  HPI: Ms. Shanks is a very charming 69 year old white female.  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  She is in [place].  She has lived all over the country and world.  Her husband was in the [employer].  He was listening on the cell phone.  It was really fun talking to both of them about their military experiences.  She is followed very closely and very thoroughly by Dr. Randel Pigg.  Dr. Randel Pigg has noted that there is been some issues with her iron and with anemia.  Ms. Satre has been very diligent in getting her routine studies done.  She is followed by Dr. Watt Climes.  She had a colonoscopy at the upper endoscopy.  She apparently had an esophageal perforation about 10 years ago.  She had an esophageal stent placed.  She has really bad reflux.  She is on Nexium and Pepcid.  Her iron studies have been on the low side lately.  She is also more anemic.  She has had no melena or bright red blood per rectum.  Back in 2018, the ferritin was 5.  Her iron saturation was 11%.  In 2020, the ferritin was 5.  Recently, her ferritin was 10 with iron saturation of 7%.  Her anemia is also been a bit more prominent.  Currently, her hemoglobin is 7.  Hemoglobin 9.7.  Platelet count 293,000.  Her MCV is 79.  She has had a hysterectomy.  She has had heavy monthly cycles in the past.  She has had a cholecystectomy.  There is no issues with blood problems in the family.  Her mother had colon cancer.  She does not smoke.  She does not really drink alcohol.  She has 2 children.  She and her husband have 6 grandchildren.  She is still working.  I am very impressed with her constitution.  Overall, her performance status is ECOG 0.    Past Medical History:  Diagnosis Date  . Allergic rhinitis   . Allergy   .  Anemia 08/24/2014  . Arthritis    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, shoulders, knees  . Benign paroxysmal positional vertigo 07/12/2013  . Chicken pox   . Cystocele    grade 2  . Endometriosis    with Menometrorhaghia   . Gastric polyps 09/30/2015  . Gastroesophageal reflux disease with hiatal hernia    Cough Sees Dr Laretta Bolster   . GERD (gastroesophageal reflux disease)   . Hemorrhoids   . History of colonic polyps   . History of colonic polyps 09/30/2015   adenoma  . HTN (hypertension) 04/23/2013  . Hypocalcemia 09/30/2015  . Hyponatremia 11/23/2016  . IBS (irritable bowel syndrome)   . Iron deficiency anemia 06/11/2019  . 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 Measles   . Mumps   . Muscle spasm 09/30/2015  . Osteopenia 10/09/2015  . Rectocele    grade 2  . Reflux    cough  . Rosacea   . Sternal fracture    1986  . Sun-damaged skin 09/05/2014  . Vertigo 11/23/2016  . Vitamin D deficiency 04/06/2016  . 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 APPENDECTOMY  1989  . atypical nevus excision    . BREAST BIOPSY  Right   . CHOLECYSTECTOMY  1989  . COLONOSCOPY  2011  . COLONOSCOPY WITH PROPOFOL N/A 05/27/2015   Procedure: COLONOSCOPY WITH PROPOFOL;  Surgeon: Clarene Essex, MD;  Location: [facility];  Service: Endoscopy;  Laterality: N/A;  . CYSTOSCOPY W/ URETERAL STENT REMOVAL  1999  . ESOPHAGOGASTRODUODENOSCOPY  2011   with esophageal stent, perforation   . ESOPHAGOGASTRODUODENOSCOPY (EGD) WITH PROPOFOL N/A 05/27/2015   Procedure: ESOPHAGOGASTRODUODENOSCOPY (EGD) WITH PROPOFOL;  Surgeon: Clarene Essex, MD;  Location: [facility];  Service: Endoscopy;  Laterality: N/A;  . HEMORRHOID SURGERY  2005  . LEFT OOPHORECTOMY  0998   complicated by ureteral injuery requiring a ureteral stent   . MMK / ANTERIOR VESICOURETHROPEXY / URETHROPEXY  2000  . TONSILLECTOMY AND ADENOIDECTOMY    . TOTAL ABDOMINAL HYSTERECTOMY  2000  . TUBAL LIGATION  1981  . wisdom teeth removal  1964   :   Current Outpatient Medications:  .  acetaminophen (TYLENOL) 500 MG tablet, Take 1,000 mg by mouth every 6 (six) hours as needed for headache (pain)., Disp: , Rfl:  .  aspirin EC 81 MG tablet, Take 81 mg by mouth daily., Disp: , Rfl:  .  benzocaine (ORAJEL) 10 % mucosal gel, Use as directed 1 application in the mouth or throat as needed for mouth pain. Reported on 07/22/2015, Disp: , Rfl:  .  Cholecalciferol (VITAMIN D3) 50 MCG (2000 UT) TABS, Take by mouth. Take 1 tablet daily Monday-Friday. Take 2 tablets on Saturday., Disp: , Rfl:  .  esomeprazole (NEXIUM) 40 MG capsule, , Disp: , Rfl: 3 .  famotidine (PEPCID) 20 MG tablet, , Disp: , Rfl:  .  Ferrous Fumarate-Folic Acid (HEMOCYTE-F) 324-1 MG TABS, Take 1 tablet by mouth daily., Disp: 30 tablet, Rfl: 2 .  hydrochlorothiazide (HYDRODIURIL) 12.5 MG tablet, Take 12.5 mg by mouth daily. Take if BP> 110/70, Disp: , Rfl:  .  Lidocaine-Hydrocortisone Ace 3-0.5 % CREA, Apply 1 application topically daily as needed (hemmoriods.). , Disp: , Rfl:  .  lisinopril (ZESTRIL) 10 MG tablet, Take 1 tablet (10 mg total) by mouth 2 (two) times daily., Disp: 180 tablet, Rfl: 1 .  metoprolol succinate (TOPROL-XL) 50 MG 24 hr tablet, Take 1-2 tablets (50-100 mg total) by mouth 2 (two) times daily. Take with or immediately following a meal., Disp: 360 tablet, Rfl: 0 .  SIMPLY SALINE NA, Place 1 each into the nose daily. , Disp: , Rfl: :  :  Allergies  Allergen Reactions  . Pseudoephedrine Hcl Er Other (See Comments)    TACHYCARDIA   . Singulair [Montelukast Sodium] Other (See Comments)    Mental status changes, irritable  . Cefdinir Hives  . Celebrex [Celecoxib] Other (See Comments)    ABDOMINAL PAIN   . Ciprofloxacin Other (See Comments)    ABDOMINAL PAIN   . Codeine Nausea And Vomiting    NAUSEA VOMITING - can take with zofran  :  Family History  Problem Relation Age of Onset  . Colon polyps Father   . Hypertension Father   . Heart failure  Father   . Melanoma Father        multiple  . Heart attack Father        x2  . Heart disease Father        Bundle Block  . Meniere's disease Father   . Cancer Father        melanoma  . Osteoporosis Mother   . Transient ischemic attack Mother   . Cancer  Mother        colon  . Heart disease Mother        afib  . Hyperlipidemia Mother   . Diabetes Paternal Grandmother   . Stroke Maternal Grandmother   . Cancer Maternal Grandmother        skin, melanoma, sarcoma  . Colon polyps Maternal Grandmother   . Diverticulitis Maternal Grandmother   . Stroke Maternal Grandfather   . Heart disease Maternal Grandfather   . Obesity Brother   :  Social History   Socioeconomic History  . Marital status: Married    Spouse name: Not on file  . Number of children: Not on file  . Years of education: Not on file  . Highest education level: Not on file  Occupational History  . Not on file  Tobacco Use  . Smoking status: Never Smoker  . Smokeless tobacco: Never Used  Substance and Sexual Activity  . Alcohol use: No  . Drug use: No  . 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 Not on file  Social History Narrative  . Not on file   Social Determinants of Health   Financial Resource Strain:   . Difficulty of Paying Living Expenses:   Food Insecurity:   . Worried About Charity fundraiser in the Last Year:   . Arboriculturist in the Last Year:   Transportation Needs:   . Film/video editor (Medical):   Marland Kitchen Lack of Transportation (Non-Medical):   Physical Activity:   . Days of Exercise per Week:   . Minutes of Exercise per Session:   Stress:   . Feeling of Stress :   Social Connections:   . Frequency of Communication with Friends and Family:   . Frequency of Social Gatherings with Friends and Family:   . Attends Religious Services:   . Active Member of Clubs or Organizations:   . Attends English as a second language teacher Meetings:   Marland Kitchen Marital Status:   Intimate Partner Violence:   . Fear of Current or Ex-Partner:   . Emotionally Abused:   Marland Kitchen Physically Abused:   . Sexually Abused:   :  Review of Systems  Constitutional: Negative.   HENT: Negative.   Eyes: Negative.   Respiratory: Negative.   Cardiovascular: Negative.   Gastrointestinal: Negative.   Genitourinary: Negative.   Musculoskeletal: Negative.   Skin: Negative.   Neurological: Negative.   Endo/Heme/Allergies: Negative.   Psychiatric/Behavioral: Negative.      Exam:  Physical Exam Vitals reviewed.  HENT:     Head: Normocephalic and atraumatic.  Eyes:     Pupils: Pupils are equal, round, and reactive to light.  Cardiovascular:     Rate and Rhythm: Normal rate and regular rhythm.     Heart sounds: Normal heart sounds.  Pulmonary:     Effort: Pulmonary effort is normal.     Breath sounds: Normal breath sounds.  Abdominal:     General: Bowel sounds are normal.     Palpations: Abdomen is soft.  Musculoskeletal:        General: No tenderness or deformity. Normal range of motion.     Cervical back: Normal range of motion.  Lymphadenopathy:     Cervical: No cervical adenopathy.  Skin:    General: Skin is warm and dry.     Findings: No erythema or rash.  Neurological:     Mental  Status: She is alert and oriented to person, place, and time.  Psychiatric:        Behavior: Behavior normal.        Thought Content: Thought content normal.        Judgment: Judgment normal.      @IPVITALS @   Recent Labs    06/11/19 1002  WBC 5.9  HGB 9.0*  HCT 30.4*  PLT 269   Recent Labs    06/11/19 1002  NA 138  K 3.7  CL 106  CO2 25  GLUCOSE 119*  BUN 21  CREATININE 0.90  CALCIUM 9.2    Blood smear review: Slightly microcytic population of red blood cells.  There is no hypochromic red blood cells.  I see no nucleated red blood cells.  There is no rouleaux formation.  I see no schistocytes or spherocytes.   She has no target cells.  White blood cells appear normal in morphology.  She has no hypersegmented polys.  She has no immature myeloid or lymphoid cells.  Platelets are adequate number and size.  Pathology: None    Assessment and Plan: Ms. Digilio is a very charming 69 year old white female.  She has iron deficiency.  I am sure that this is all from malabsorption given that she is on Nexium and Pepcid.  We will have to see what her iron studies show.  I am also going to check her erythropoietin level.  She also may be somewhat erythropoietin deficient.  I am impressed with her stamina.  She is somewhat anemic.  However, she seems to be functioning fairly well.  I do not see any indication that she needs a bone marrow biopsy.  I do not see need for CT scans or other radiologic studies.  I spent about 45 minutes or so which she and her husband.  They are both very nice.  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  I gave her a prayer blanket.  She was very thankful for this.  Once we get her lab results back, we will then make a plan for her to come back and see how we can get her anemia resolved.

## 2019-06-12 LAB — IRON AND TIBC
Iron: 29 ug/dL — ABNORMAL LOW (ref 41–142)
Saturation Ratios: 7 % — ABNORMAL LOW (ref 21–57)
TIBC: 400 ug/dL (ref 236–444)
UIBC: 371 ug/dL (ref 120–384)

## 2019-06-12 LAB — ERYTHROPOIETIN: Erythropoietin: 29.2 m[IU]/mL — ABNORMAL HIGH (ref 2.6–18.5)

## 2019-06-12 LAB — FERRITIN: Ferritin: 9 ng/mL — ABNORMAL LOW (ref 11–307)

## 2019-06-15 ENCOUNTER — Telehealth: Payer: Self-pay | Admitting: Hematology & Oncology

## 2019-06-15 NOTE — Telephone Encounter (Signed)
Called and spoke with patient regarding appointments added per  3/19 result note

## 2019-06-16 ENCOUNTER — Inpatient Hospital Stay: Payer: Medicare Other

## 2019-06-16 ENCOUNTER — Other Ambulatory Visit: Payer: Self-pay

## 2019-06-16 ENCOUNTER — Other Ambulatory Visit: Payer: Self-pay | Admitting: Family

## 2019-06-16 VITALS — BP 112/53 | HR 72 | Temp 97.6°F | Resp 18

## 2019-06-16 DIAGNOSIS — K909 Intestinal malabsorption, unspecified: Secondary | ICD-10-CM

## 2019-06-16 DIAGNOSIS — Z79899 Other long term (current) drug therapy: Secondary | ICD-10-CM | POA: Diagnosis not present

## 2019-06-16 DIAGNOSIS — D508 Other iron deficiency anemias: Secondary | ICD-10-CM

## 2019-06-16 DIAGNOSIS — I10 Essential (primary) hypertension: Secondary | ICD-10-CM | POA: Diagnosis not present

## 2019-06-16 DIAGNOSIS — M129 Arthropathy, unspecified: Secondary | ICD-10-CM | POA: Diagnosis not present

## 2019-06-16 DIAGNOSIS — H8110 Benign paroxysmal vertigo, unspecified ear: Secondary | ICD-10-CM | POA: Diagnosis not present

## 2019-06-16 DIAGNOSIS — K219 Gastro-esophageal reflux disease without esophagitis: Secondary | ICD-10-CM | POA: Diagnosis not present

## 2019-06-16 DIAGNOSIS — D509 Iron deficiency anemia, unspecified: Secondary | ICD-10-CM | POA: Diagnosis not present

## 2019-06-16 MED ORDER — SODIUM CHLORIDE 0.9 % IV SOLN
510.0000 mg | Freq: Once | INTRAVENOUS | Status: AC
  Administered 2019-06-16: 510 mg via INTRAVENOUS
  Filled 2019-06-16: qty 17

## 2019-06-16 MED ORDER — SODIUM CHLORIDE 0.9 % IV SOLN
Freq: Once | INTRAVENOUS | Status: AC
  Filled 2019-06-16: qty 250

## 2019-06-16 NOTE — Patient Instructions (Signed)

## 2019-06-23 ENCOUNTER — Inpatient Hospital Stay: Payer: Medicare Other

## 2019-06-23 ENCOUNTER — Other Ambulatory Visit: Payer: Self-pay

## 2019-06-23 VITALS — BP 106/46 | HR 69 | Temp 97.1°F | Resp 17

## 2019-06-23 DIAGNOSIS — D508 Other iron deficiency anemias: Secondary | ICD-10-CM

## 2019-06-23 DIAGNOSIS — I10 Essential (primary) hypertension: Secondary | ICD-10-CM | POA: Diagnosis not present

## 2019-06-23 DIAGNOSIS — H8110 Benign paroxysmal vertigo, unspecified ear: Secondary | ICD-10-CM | POA: Diagnosis not present

## 2019-06-23 DIAGNOSIS — K219 Gastro-esophageal reflux disease without esophagitis: Secondary | ICD-10-CM | POA: Diagnosis not present

## 2019-06-23 DIAGNOSIS — K909 Intestinal malabsorption, unspecified: Secondary | ICD-10-CM

## 2019-06-23 DIAGNOSIS — Z79899 Other long term (current) drug therapy: Secondary | ICD-10-CM | POA: Diagnosis not present

## 2019-06-23 DIAGNOSIS — M129 Arthropathy, unspecified: Secondary | ICD-10-CM | POA: Diagnosis not present

## 2019-06-23 DIAGNOSIS — D509 Iron deficiency anemia, unspecified: Secondary | ICD-10-CM | POA: Diagnosis not present

## 2019-06-23 MED ORDER — SODIUM CHLORIDE 0.9 % IV SOLN
510.0000 mg | Freq: Once | INTRAVENOUS | Status: AC
  Administered 2019-06-23: 510 mg via INTRAVENOUS
  Filled 2019-06-23: qty 510

## 2019-06-23 MED ORDER — SODIUM CHLORIDE 0.9 % IV SOLN
Freq: Once | INTRAVENOUS | Status: AC
  Filled 2019-06-23: qty 250

## 2019-06-23 NOTE — Patient Instructions (Signed)

## 2019-06-24 ENCOUNTER — Encounter: Payer: Self-pay | Admitting: *Deleted

## 2019-06-24 ENCOUNTER — Encounter: Payer: Self-pay | Admitting: Hematology & Oncology

## 2019-06-25 ENCOUNTER — Telehealth: Payer: Self-pay | Admitting: Family

## 2019-06-25 NOTE — Telephone Encounter (Signed)
Called and spoke with patient regarding appointments added per 4/1 sch msg 

## 2019-06-30 MED FILL — ESOMEPRAZOLE MAG DR 40 MG C: 40 | 30 days supply | Qty: 30 | Fill #4

## 2019-07-01 DIAGNOSIS — D500 Iron deficiency anemia secondary to blood loss (chronic): Secondary | ICD-10-CM | POA: Diagnosis not present

## 2019-07-08 DIAGNOSIS — D500 Iron deficiency anemia secondary to blood loss (chronic): Secondary | ICD-10-CM | POA: Diagnosis not present

## 2019-07-08 DIAGNOSIS — Z86010 Personal history of colonic polyps: Secondary | ICD-10-CM | POA: Diagnosis not present

## 2019-07-08 DIAGNOSIS — K921 Melena: Secondary | ICD-10-CM | POA: Diagnosis not present

## 2019-07-08 MED FILL — LIDOCAINE-HC 3-0.5% CREAM: 3-0.5 | 30 days supply | Qty: 98 | Fill #0

## 2019-07-09 ENCOUNTER — Telehealth: Payer: Medicare Other | Admitting: Family Medicine

## 2019-07-13 ENCOUNTER — Other Ambulatory Visit: Payer: Self-pay

## 2019-07-13 ENCOUNTER — Ambulatory Visit (HOSPITAL_BASED_OUTPATIENT_CLINIC_OR_DEPARTMENT_OTHER)
Admission: RE | Admit: 2019-07-13 | Discharge: 2019-07-13 | Disposition: A | Discharge status: Home / Self Care | Payer: Medicare Other | Source: Ambulatory Visit | Attending: Family Medicine | Admitting: Family Medicine

## 2019-07-13 DIAGNOSIS — R1031 Right lower quadrant pain: Secondary | ICD-10-CM | POA: Diagnosis not present

## 2019-07-13 MED ORDER — IOHEXOL 300 MG/ML  SOLN
100.0000 mL | Freq: Once | INTRAMUSCULAR | Status: AC | PRN
  Administered 2019-07-13: 09:00:00 100 mL via INTRAVENOUS

## 2019-07-16 ENCOUNTER — Other Ambulatory Visit: Payer: Self-pay | Admitting: Family Medicine

## 2019-07-16 ENCOUNTER — Other Ambulatory Visit: Payer: Self-pay

## 2019-07-16 ENCOUNTER — Encounter: Payer: Self-pay | Admitting: Family Medicine

## 2019-07-16 ENCOUNTER — Telehealth (INDEPENDENT_AMBULATORY_CARE_PROVIDER_SITE_OTHER): Payer: Medicare Other | Admitting: Family Medicine

## 2019-07-16 VITALS — BP 129/81 | HR 69 | Wt 171.0 lb

## 2019-07-16 DIAGNOSIS — E559 Vitamin D deficiency, unspecified: Secondary | ICD-10-CM

## 2019-07-16 DIAGNOSIS — E538 Deficiency of other specified B group vitamins: Secondary | ICD-10-CM | POA: Diagnosis not present

## 2019-07-16 DIAGNOSIS — I10 Essential (primary) hypertension: Secondary | ICD-10-CM

## 2019-07-16 DIAGNOSIS — K219 Gastro-esophageal reflux disease without esophagitis: Secondary | ICD-10-CM

## 2019-07-16 DIAGNOSIS — D508 Other iron deficiency anemias: Secondary | ICD-10-CM

## 2019-07-16 DIAGNOSIS — R739 Hyperglycemia, unspecified: Secondary | ICD-10-CM

## 2019-07-16 DIAGNOSIS — E785 Hyperlipidemia, unspecified: Secondary | ICD-10-CM

## 2019-07-16 DIAGNOSIS — K449 Diaphragmatic hernia without obstruction or gangrene: Secondary | ICD-10-CM

## 2019-07-16 MED ORDER — METOPROLOL SUCCINATE ER 50 MG PO TB24: 50.0000 mg | ORAL_TABLET | Freq: Two times a day (BID) | ORAL | 1 refills | Status: DC

## 2019-07-19 NOTE — Assessment & Plan Note (Signed)
Avoid offending foods, start probiotics. Do not eat large meals in late evening and consider raising head of bed.  

## 2019-07-19 NOTE — Assessment & Plan Note (Addendum)
Metoprolol 50 mg bid . Encouraged heart healthy diet such as the DASH diet and exercise as tolerated. Blood pressure log is reviewed.

## 2019-07-19 NOTE — Assessment & Plan Note (Signed)
Continue to monitor

## 2019-07-19 NOTE — Assessment & Plan Note (Signed)
Encouraged heart healthy diet, increase exercise, avoid trans fats, consider a krill oil cap daily 

## 2019-07-19 NOTE — Assessment & Plan Note (Signed)
hgba1c acceptable, minimize simple carbs. Increase exercise as tolerated.  

## 2019-07-19 NOTE — Assessment & Plan Note (Signed)
Supplement and monitor 

## 2019-07-19 NOTE — Progress Notes (Signed)
Virtual Visit via Video Note  I connected with Carrie Elliott on 07/16/19 at  2:40 PM EDT by a video enabled telemedicine application and verified that I am speaking with the correct person using two identifiers.  Location: Patient: home Provider: office   I discussed the limitations of evaluation and management by telemedicine and the availability of in person appointments. The patient expressed understanding and agreed to proceed. Kem Boroughs, CMA was able to get the patient setup on a video visit   Subjective:    Patient ID: Carrie Elliott, female    DOB: 1950-12-10, 69 y.o.   MRN: [id number]  Chief Complaint  Patient presents with  . PHQ-9 8 Week Follow-up    HPI Patient is in today for follow up on chronic medical concerns. No recent febrile illness or hospitalizations. Her blood pressure has improved but can still run high at times. Denies CP/palp/SOB/HA/congestion/fevers/GI or GU c/o. Taking meds as prescribed  Past Medical History:  Diagnosis Date  . Allergic rhinitis   . Allergy   . Anemia 08/24/2014  . Arthritis    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, shoulders, knees  . Benign paroxysmal positional vertigo 07/12/2013  . Chicken pox   . Cystocele    grade 2  . Endometriosis    with Menometrorhaghia   . Gastric polyps 09/30/2015  . Gastroesophageal reflux disease with hiatal hernia    Cough Sees Dr Laretta Bolster   . GERD (gastroesophageal reflux disease)   . Hemorrhoids   . History of colonic polyps   . History of colonic polyps 09/30/2015   adenoma  . HTN (hypertension) 04/23/2013  . Hypocalcemia 09/30/2015  . Hyponatremia 11/23/2016  . IBS (irritable bowel syndrome)   . Iron deficiency anemia 06/11/2019  . 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 Measles   . Mumps   . Muscle spasm 09/30/2015  . Osteopenia 10/09/2015  . Rectocele    grade 2  . Reflux    cough  . Rosacea   . Sternal fracture    1986  . Sun-damaged skin 09/05/2014  . Vertigo 11/23/2016  . Vitamin D deficiency 04/06/2016  .  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 APPENDECTOMY  1989  . atypical nevus excision    . BREAST BIOPSY Right   . CHOLECYSTECTOMY  1989  . COLONOSCOPY  2011  . COLONOSCOPY WITH PROPOFOL N/A 05/27/2015   Procedure: COLONOSCOPY WITH PROPOFOL;  Surgeon: Clarene Essex, MD;  Location: [facility];  Service: Endoscopy;  Laterality: N/A;  . CYSTOSCOPY W/ URETERAL STENT REMOVAL  1999  . ESOPHAGOGASTRODUODENOSCOPY  2011   with esophageal stent, perforation   . ESOPHAGOGASTRODUODENOSCOPY (EGD) WITH PROPOFOL N/A 05/27/2015   Procedure: ESOPHAGOGASTRODUODENOSCOPY (EGD) WITH PROPOFOL;  Surgeon: Clarene Essex, MD;  Location: [facility];  Service: Endoscopy;  Laterality: N/A;  . HEMORRHOID SURGERY  2005  . LEFT OOPHORECTOMY  [id number]   complicated by ureteral injuery requiring a ureteral stent   . MMK / ANTERIOR VESICOURETHROPEXY / URETHROPEXY  2000  . TONSILLECTOMY AND ADENOIDECTOMY    . TOTAL ABDOMINAL HYSTERECTOMY  2000  . TUBAL LIGATION  1981  . wisdom teeth removal  1964    Family History  Problem Relation Age of Onset  . Colon polyps Father   . Hypertension Father   . Heart failure Father   . Melanoma Father        multiple  . Heart attack Father        x2  .  Heart disease Father        Bundle Block  . Meniere's disease Father   . Cancer Father        melanoma  . Osteoporosis Mother   . Transient ischemic attack Mother   . Cancer Mother        colon  . Heart disease Mother        afib  . Hyperlipidemia Mother   . Diabetes Paternal Grandmother   . Stroke Maternal Grandmother   . Cancer Maternal Grandmother        skin, melanoma, sarcoma  . Colon polyps Maternal Grandmother   . Diverticulitis Maternal Grandmother   . Stroke Maternal Grandfather   . Heart disease Maternal Grandfather   . Obesity Brother     Social History   Socioeconomic History  . Marital status: Married    Spouse name: Not on file  . Number of children: Not on file   . Years of education: Not on file  . Highest education level: Not on file  Occupational History  . Not on file  Tobacco Use  . Smoking status: Never Smoker  . Smokeless tobacco: Never Used  Substance and Sexual Activity  . Alcohol use: No  . Drug use: No  . 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 Not on file  Social History Narrative  . Not on file   Social Determinants of Health   Financial Resource Strain:   . Difficulty of Paying Living Expenses:   Food Insecurity:   . Worried About Charity fundraiser in the Last Year:   . Arboriculturist in the Last Year:   Transportation Needs:   . Film/video editor (Medical):   Marland Kitchen Lack of Transportation (Non-Medical):   Physical Activity:   . Days of Exercise per Week:   . Minutes of Exercise per Session:   Stress:   . Feeling of Stress :   Social Connections:   . Frequency of Communication with Friends and Family:   . Frequency of Social Gatherings with Friends and Family:   . Attends Religious Services:   . Active Member of Clubs or Organizations:   . Attends Archivist Meetings:   Marland Kitchen Marital Status:   Intimate Partner Violence:   . Fear of Current or Ex-Partner:   . Emotionally Abused:   Marland Kitchen Physically Abused:   . Sexually Abused:     Outpatient Medications Prior to Visit  Medication Sig Dispense Refill  . acetaminophen (TYLENOL) 500 MG tablet Take 1,000 mg by mouth every 6 (six) hours as needed for headache (pain).    . benzocaine (ORAJEL) 10 % mucosal gel Use as directed 1 application in the mouth or throat as needed for mouth pain. Reported on 07/22/2015    . Cholecalciferol (VITAMIN D3) 50 MCG (2000 UT) TABS Take by mouth. Take 1 tablet daily Monday-Friday. Take 2 tablets on Saturday.    . esomeprazole (NEXIUM) 40 MG capsule   3  . famotidine (PEPCID) 20 MG tablet     . hydrochlorothiazide (HYDRODIURIL) 12.5 MG tablet  Take 12.5 mg by mouth daily. Take if BP> 110/70    . Lidocaine-Hydrocortisone Ace 3-0.5 % CREA Apply 1 application topically daily as needed (hemmoriods.).     Marland Kitchen lisinopril (ZESTRIL) 10 MG tablet Take 1 tablet (10 mg total) by mouth 2 (two) times daily. 180 tablet 1  .  SIMPLY SALINE NA Place 1 each into the nose daily.     . metoprolol succinate (TOPROL-XL) 50 MG 24 hr tablet Take 1-2 tablets (50-100 mg total) by mouth 2 (two) times daily. Take with or immediately following a meal. 360 tablet 0  . aspirin EC 81 MG tablet Take 81 mg by mouth daily.    . Ferrous Fumarate-Folic Acid (HEMOCYTE-F) 324-1 MG TABS Take 1 tablet by mouth daily. 30 tablet 2   No facility-administered medications prior to visit.    Allergies  Allergen Reactions  . Pseudoephedrine Hcl Er Other (See Comments)    TACHYCARDIA   . Singulair [Montelukast Sodium] Other (See Comments)    Mental status changes, irritable  . Cefdinir Hives  . Celebrex [Celecoxib] Other (See Comments)    ABDOMINAL PAIN   . Ciprofloxacin Other (See Comments)    ABDOMINAL PAIN   . Codeine Nausea And Vomiting    NAUSEA VOMITING - can take with zofran    Review of Systems  Constitutional: Negative for fever and malaise/fatigue.  HENT: Negative for congestion.   Eyes: Negative for blurred vision.  Respiratory: Negative for shortness of breath.   Cardiovascular: Negative for chest pain, palpitations and leg swelling.  Gastrointestinal: Negative for abdominal pain, blood in stool and nausea.  Genitourinary: Negative for dysuria and frequency.  Musculoskeletal: Negative for falls.  Skin: Negative for rash.  Neurological: Negative for dizziness, loss of consciousness and headaches.  Endo/Heme/Allergies: Negative for environmental allergies.  Psychiatric/Behavioral: Negative for depression. The patient is not nervous/anxious.        Objective:    Physical Exam Constitutional:      Appearance: Normal appearance.  HENT:     Head:  Normocephalic and atraumatic.     Right Ear: External ear normal.     Left Ear: External ear normal.  Eyes:     General:        Right eye: No discharge.        Left eye: No discharge.  Pulmonary:     Effort: Pulmonary effort is normal.  Neurological:     Mental Status: She is alert and oriented to person, place, and time.  Psychiatric:        Behavior: Behavior normal.     BP 129/81   Pulse 69   Wt 171 lb (77.6 kg)   SpO2 99%   BMI 32.31 kg/m  Wt Readings from Last 3 Encounters:  07/16/19 171 lb (77.6 kg)  06/11/19 175 lb (79.4 kg)  05/11/19 168 lb (76.2 kg)    Diabetic Foot Exam - Simple   No data filed     Lab Results  Component Value Date   WBC 5.9 06/11/2019   HGB 9.0 (L) 06/11/2019   HCT 30.4 (L) 06/11/2019   PLT 269 06/11/2019   GLUCOSE 119 (H) 06/11/2019   CHOL 189 05/25/2019   TRIG 136.0 05/25/2019   HDL 52.80 05/25/2019   LDLDIRECT 106.0 02/18/2018   LDLCALC 109 (H) 05/25/2019   ALT 10 06/11/2019   AST 12 (L) 06/11/2019   NA 138 06/11/2019   K 3.7 06/11/2019   CL 106 06/11/2019   CREATININE 0.90 06/11/2019   BUN 21 06/11/2019   CO2 25 06/11/2019   TSH 1.29 05/25/2019   INR 1.19 02/07/2010   HGBA1C 5.6 05/25/2019    Lab Results  Component Value Date   TSH 1.29 05/25/2019   Lab Results  Component Value Date   WBC 5.9 06/11/2019   HGB 9.0 (L)  06/11/2019   HCT 30.4 (L) 06/11/2019   MCV 83.5 06/11/2019   PLT 269 06/11/2019   Lab Results  Component Value Date   NA 138 06/11/2019   K 3.7 06/11/2019   CO2 25 06/11/2019   GLUCOSE 119 (H) 06/11/2019   BUN 21 06/11/2019   CREATININE 0.90 06/11/2019   BILITOT 0.2 (L) 06/11/2019   ALKPHOS 43 06/11/2019   AST 12 (L) 06/11/2019   ALT 10 06/11/2019   PROT 6.6 06/11/2019   ALBUMIN 4.1 06/11/2019   CALCIUM 9.2 06/11/2019   ANIONGAP 7 06/11/2019   GFR 55.67 (L) 05/25/2019   Lab Results  Component Value Date   CHOL 189 05/25/2019   Lab Results  Component Value Date   HDL 52.80  05/25/2019   Lab Results  Component Value Date   LDLCALC 109 (H) 05/25/2019   Lab Results  Component Value Date   TRIG 136.0 05/25/2019   Lab Results  Component Value Date   CHOLHDL 4 05/25/2019   Lab Results  Component Value Date   HGBA1C 5.6 05/25/2019       Assessment & Plan:   Problem List Items Addressed This Visit    HTN (hypertension) - Primary (Chronic)    Metoprolol 50 mg bid . Encouraged heart healthy diet such as the DASH diet and exercise as tolerated. Blood pressure log is reviewed.       Relevant Medications   metoprolol succinate (TOPROL-XL) 50 MG 24 hr tablet   Other Relevant Orders   CBC   Comprehensive metabolic panel   TSH   Iron deficiency anemia (Chronic)   Gastroesophageal reflux disease with hiatal hernia    Avoid offending foods, start probiotics. Do not eat large meals in late evening and consider raising head of bed.       Vitamin D deficiency    Supplement and monitor      Relevant Orders   VITAMIN D 25 Hydroxy (Vit-D Deficiency, Fractures)   Hyperlipidemia    Encouraged heart healthy diet, increase exercise, avoid trans fats, consider a krill oil cap daily      Relevant Medications   metoprolol succinate (TOPROL-XL) 50 MG 24 hr tablet   Other Relevant Orders   Lipid panel   Hyperglycemia    hgba1c acceptable, minimize simple carbs. Increase exercise as tolerated.       Relevant Orders   Hemoglobin A1c   Vitamin B12 deficiency    Continue to monitor      Relevant Orders   Vitamin B12      I have discontinued Christean Grief. Angelillo's Hemocyte-F. I am also having her maintain her acetaminophen, benzocaine, Lidocaine-Hydrocortisone Ace, SIMPLY SALINE NA, famotidine, esomeprazole, lisinopril, aspirin EC, Vitamin D3, hydrochlorothiazide, and metoprolol succinate.  Meds ordered this encounter  Medications  . metoprolol succinate (TOPROL-XL) 50 MG 24 hr tablet    Sig: Take 1-2 tablets (50-100 mg total) by mouth 2 (two) times daily.  Take with or immediately following a meal.    Dispense:  360 tablet    Refill:  1     I discussed the assessment and treatment plan with the patient. The patient was provided an opportunity to ask questions and all were answered. The patient agreed with the plan and demonstrated an understanding of the instructions.   The patient was advised to call back or seek an in-person evaluation if the symptoms worsen or if the condition fails to improve as anticipated.  I provided 20 minutes of non-face-to-face time during this  encounter.   Penni Homans, MD

## 2019-07-22 ENCOUNTER — Other Ambulatory Visit: Payer: Self-pay | Admitting: *Deleted

## 2019-07-22 DIAGNOSIS — D500 Iron deficiency anemia secondary to blood loss (chronic): Secondary | ICD-10-CM

## 2019-07-23 ENCOUNTER — Inpatient Hospital Stay: Payer: Medicare Other

## 2019-07-23 ENCOUNTER — Encounter: Payer: Self-pay | Admitting: *Deleted

## 2019-07-23 ENCOUNTER — Other Ambulatory Visit: Payer: Self-pay

## 2019-07-23 ENCOUNTER — Inpatient Hospital Stay (HOSPITAL_BASED_OUTPATIENT_CLINIC_OR_DEPARTMENT_OTHER): Payer: Medicare Other | Admitting: Hematology & Oncology

## 2019-07-23 ENCOUNTER — Inpatient Hospital Stay: Payer: Medicare Other | Attending: Hematology & Oncology

## 2019-07-23 ENCOUNTER — Encounter: Payer: Self-pay | Admitting: Hematology & Oncology

## 2019-07-23 VITALS — BP 104/61 | HR 71 | Temp 97.1°F | Resp 20 | Wt 174.1 lb

## 2019-07-23 DIAGNOSIS — D509 Iron deficiency anemia, unspecified: Secondary | ICD-10-CM | POA: Insufficient documentation

## 2019-07-23 DIAGNOSIS — Z79899 Other long term (current) drug therapy: Secondary | ICD-10-CM | POA: Diagnosis not present

## 2019-07-23 DIAGNOSIS — Z7982 Long term (current) use of aspirin: Secondary | ICD-10-CM | POA: Insufficient documentation

## 2019-07-23 DIAGNOSIS — D500 Iron deficiency anemia secondary to blood loss (chronic): Secondary | ICD-10-CM

## 2019-07-23 DIAGNOSIS — K909 Intestinal malabsorption, unspecified: Secondary | ICD-10-CM

## 2019-07-23 LAB — CBC WITH DIFFERENTIAL (CANCER CENTER ONLY)
Abs Immature Granulocytes: 0.03 10*3/uL (ref 0.00–0.07)
Basophils Absolute: 0 10*3/uL (ref 0.0–0.1)
Basophils Relative: 1 %
Eosinophils Absolute: 0.1 10*3/uL (ref 0.0–0.5)
Eosinophils Relative: 1 %
HCT: 37.8 % (ref 36.0–46.0)
Hemoglobin: 12 g/dL (ref 12.0–15.0)
Immature Granulocytes: 1 %
Lymphocytes Relative: 28 %
Lymphs Abs: 1.5 10*3/uL (ref 0.7–4.0)
MCH: 26.9 pg (ref 26.0–34.0)
MCHC: 31.7 g/dL (ref 30.0–36.0)
MCV: 84.8 fL (ref 80.0–100.0)
Monocytes Absolute: 0.5 10*3/uL (ref 0.1–1.0)
Monocytes Relative: 10 %
Neutro Abs: 3.3 10*3/uL (ref 1.7–7.7)
Neutrophils Relative %: 59 %
Platelet Count: 230 10*3/uL (ref 150–400)
RBC: 4.46 MIL/uL (ref 3.87–5.11)
RDW: 19 % — ABNORMAL HIGH (ref 11.5–15.5)
WBC Count: 5.5 10*3/uL (ref 4.0–10.5)
nRBC: 0 % (ref 0.0–0.2)

## 2019-07-23 LAB — CMP (CANCER CENTER ONLY)
ALT: 14 U/L (ref 0–44)
AST: 14 U/L — ABNORMAL LOW (ref 15–41)
Albumin: 4.2 g/dL (ref 3.5–5.0)
Alkaline Phosphatase: 45 U/L (ref 38–126)
Anion gap: 8 (ref 5–15)
BUN: 18 mg/dL (ref 8–23)
CO2: 27 mmol/L (ref 22–32)
Calcium: 9.8 mg/dL (ref 8.9–10.3)
Chloride: 101 mmol/L (ref 98–111)
Creatinine: 1.07 mg/dL — ABNORMAL HIGH (ref 0.44–1.00)
GFR, Est AFR Am: >60 mL/min (ref >60)
GFR, Estimated: 53 mL/min — ABNORMAL LOW (ref >60)
Glucose, Bld: 95 mg/dL (ref 70–99)
Potassium: 4.1 mmol/L (ref 3.5–5.1)
Sodium: 136 mmol/L (ref 135–145)
Total Bilirubin: 0.3 mg/dL (ref 0.3–1.2)
Total Protein: 7.3 g/dL (ref 6.5–8.1)

## 2019-07-23 LAB — IRON AND TIBC
Iron: 78 ug/dL (ref 41–142)
Saturation Ratios: 25 % (ref 21–57)
TIBC: 319 ug/dL (ref 236–444)
UIBC: 240 ug/dL (ref 120–384)

## 2019-07-23 LAB — FERRITIN: Ferritin: 308 ng/mL — ABNORMAL HIGH (ref 11–307)

## 2019-07-23 NOTE — Progress Notes (Signed)
Hematology and Oncology Follow Up Visit  Carrie Elliott TD:[id number] [birth year] 69 y.o. 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  When I first saw her, I thought was pretty evident that Carrie Elliott had iron deficiency anemia.  We did iron studies on her.  Her ferritin was only 9 with an iron saturation of 7%.  Carrie Elliott got 2 doses of IV iron.  Carrie Elliott tolerated this well.  Carrie Elliott feels better.  Carrie Elliott has had no problems with bleeding.  Carrie Elliott has had no problems with nausea or vomiting.  There is been no cough.  Carrie Elliott has had no rashes.  Carrie Elliott still has some fatigue.  I am sure her iron studies probably will still be on the lower side.  Carrie Elliott has had no fever.  Of note, Carrie Elliott had a CT scan of the abdomen and pelvis on 07/13/2019.  This did not show any abnormalities.  There may have been some slight fatty infiltration in the liver.  Overall, her performance status is ECOG 1.  Medications:  Current Outpatient Medications:  .  acetaminophen (TYLENOL) 500 MG tablet, Take 1,000 mg by mouth every 6 (six) hours as needed for headache (pain)., Disp: , Rfl:  .  benzocaine (ORAJEL) 10 % mucosal gel, Use as directed 1 application in the mouth or throat as needed for mouth pain. Reported on 07/22/2015, Disp: , Rfl:  .  Cholecalciferol (VITAMIN D3) 50 MCG (2000 UT) TABS, Take by mouth. Take 1 tablet daily Monday-Friday. Take 2 tablets on Saturday., Disp: , Rfl:  .  esomeprazole (NEXIUM) 40 MG capsule, Take by mouth daily. , Disp: , Rfl: 3 .  famotidine (PEPCID) 20 MG tablet, 2 (two) times daily as needed. , Disp: , Rfl:  .  hydrochlorothiazide (HYDRODIURIL) 12.5 MG tablet, Take 12.5 mg by mouth daily. Take if BP> 110/70, Disp: , Rfl:  .  Lidocaine-Hydrocortisone Ace 3-0.5 % CREA, Apply 1 application topically daily as needed (hemmoriods.). , Disp: , Rfl:  .   lisinopril (ZESTRIL) 10 MG tablet, Take 1 tablet (10 mg total) by mouth 2 (two) times daily., Disp: 180 tablet, Rfl: 1 .  metoprolol succinate (TOPROL-XL) 50 MG 24 hr tablet, Take 1-2 tablets (50-100 mg total) by mouth 2 (two) times daily. Take with or immediately following a meal., Disp: 360 tablet, Rfl: 1 .  SIMPLY SALINE NA, Place 1 each into the nose daily. , Disp: , Rfl:  .  aspirin EC 81 MG tablet, Take 81 mg by mouth daily. 07/23/2019 ASA on hold per Dr. Randel Pigg., Disp: , Rfl:   Allergies:  Allergies  Allergen Reactions  . Pseudoephedrine Hcl Er Other (See Comments)    TACHYCARDIA   . Singulair [Montelukast Sodium] Other (See Comments)    Mental status changes, irritable  . Cefdinir Hives  . Celebrex [Celecoxib] Other (See Comments)    ABDOMINAL PAIN   . Ciprofloxacin Other (See Comments)    ABDOMINAL PAIN   . Codeine Nausea And Vomiting    NAUSEA VOMITING - can take with zofran    Past Medical History, Surgical history, Social history, and Family History were reviewed and updated.  Review of Systems: Review of Systems  Constitutional: Negative.   HENT:  Negative.   Eyes: Negative.   Respiratory: Negative.   Cardiovascular: Negative.   Gastrointestinal: Negative.  Endocrine: Negative.   Genitourinary: Negative.    Musculoskeletal: Negative.   Skin: Negative.   Neurological: Negative.   Hematological: Negative.   Psychiatric/Behavioral: Negative.     Physical Exam:  weight is 174 lb 1.9 oz (79 kg). Her temporal temperature is 97.1 F (36.2 C) (abnormal). Her blood pressure is 104/61 and her pulse is 71. Her respiration is 20 and oxygen saturation is 99%.   Wt Readings from Last 3 Encounters:  07/23/19 174 lb 1.9 oz (79 kg)  07/16/19 171 lb (77.6 kg)  06/11/19 175 lb (79.4 kg)    Physical Exam Vitals reviewed.  HENT:     Head: Normocephalic and atraumatic.  Eyes:     Pupils: Pupils are equal, round, and reactive to light.  Cardiovascular:     Rate and  Rhythm: Normal rate and regular rhythm.     Heart sounds: Normal heart sounds.  Pulmonary:     Effort: Pulmonary effort is normal.     Breath sounds: Normal breath sounds.  Abdominal:     General: Bowel sounds are normal.     Palpations: Abdomen is soft.  Musculoskeletal:        General: No tenderness or deformity. Normal range of motion.     Cervical back: Normal range of motion.  Lymphadenopathy:     Cervical: No cervical adenopathy.  Skin:    General: Skin is warm and dry.     Findings: No erythema or rash.  Neurological:     Mental Status: Carrie Elliott is alert and oriented to person, place, and time.  Psychiatric:        Behavior: Behavior normal.        Thought Content: Thought content normal.        Judgment: Judgment normal.      Lab Results  Component Value Date   WBC 5.5 07/23/2019   HGB 12.0 07/23/2019   HCT 37.8 07/23/2019   MCV 84.8 07/23/2019   PLT 230 07/23/2019     Chemistry      Component Value Date/Time   NA 138 06/11/2019 1002   K 3.7 06/11/2019 1002   CL 106 06/11/2019 1002   CO2 25 06/11/2019 1002   BUN 21 06/11/2019 1002   CREATININE 0.90 06/11/2019 1002      Component Value Date/Time   CALCIUM 9.2 06/11/2019 1002   ALKPHOS 43 06/11/2019 1002   AST 12 (L) 06/11/2019 1002   ALT 10 06/11/2019 1002   BILITOT 0.2 (L) 06/11/2019 1002       Impression and Plan: Carrie Elliott is a very nice 69 year old white female.  Carrie Elliott has iron deficiency anemia.  Carrie Elliott has some iron malabsorption.  Again, her hemoglobin has normalized.  This is certainly very encouraging.  We will see what her iron studies look like.  I have to believe that her iron still might be on the lower side.  Her MCV is a little bit better which is nice to see.  I think we probably get her back now in a couple months.  Hopefully we can start moving her appointments out a little bit further as her iron levels normalized.   [name], MD 4/29/20218:37 AM

## 2019-08-17 ENCOUNTER — Other Ambulatory Visit: Payer: Self-pay | Admitting: Family Medicine

## 2019-08-17 MED FILL — LISINOPRIL 10 MG TABS: 10 | 90 days supply | Qty: 180 | Fill #0

## 2019-08-26 ENCOUNTER — Other Ambulatory Visit: Payer: Self-pay

## 2019-08-26 ENCOUNTER — Other Ambulatory Visit (INDEPENDENT_AMBULATORY_CARE_PROVIDER_SITE_OTHER): Payer: Medicare Other

## 2019-08-26 DIAGNOSIS — E559 Vitamin D deficiency, unspecified: Secondary | ICD-10-CM | POA: Diagnosis not present

## 2019-08-26 DIAGNOSIS — R739 Hyperglycemia, unspecified: Secondary | ICD-10-CM

## 2019-08-26 DIAGNOSIS — E785 Hyperlipidemia, unspecified: Secondary | ICD-10-CM

## 2019-08-26 DIAGNOSIS — E538 Deficiency of other specified B group vitamins: Secondary | ICD-10-CM | POA: Diagnosis not present

## 2019-08-26 DIAGNOSIS — I10 Essential (primary) hypertension: Secondary | ICD-10-CM | POA: Diagnosis not present

## 2019-08-26 LAB — LIPID PANEL
Cholesterol: 203 mg/dL — ABNORMAL HIGH (ref 0–200)
HDL: 51.9 mg/dL (ref >39.00)
LDL Cholesterol: 125 mg/dL — ABNORMAL HIGH (ref 0–99)
NonHDL: 151.12
Total CHOL/HDL Ratio: 4
Triglycerides: 133 mg/dL (ref 0.0–149.0)
VLDL: 26.6 mg/dL (ref 0.0–40.0)

## 2019-08-26 LAB — COMPREHENSIVE METABOLIC PANEL
ALT: 19 U/L (ref 0–35)
AST: 16 U/L (ref 0–37)
Albumin: 4.1 g/dL (ref 3.5–5.2)
Alkaline Phosphatase: 52 U/L (ref 39–117)
BUN: 17 mg/dL (ref 6–23)
CO2: 27 mEq/L (ref 19–32)
Calcium: 9.2 mg/dL (ref 8.4–10.5)
Chloride: 103 mEq/L (ref 96–112)
Creatinine, Ser: 0.93 mg/dL (ref 0.40–1.20)
GFR: 59.79 mL/min — ABNORMAL LOW (ref >60.00)
Glucose, Bld: 97 mg/dL (ref 70–99)
Potassium: 4 mEq/L (ref 3.5–5.1)
Sodium: 136 mEq/L (ref 135–145)
Total Bilirubin: 0.4 mg/dL (ref 0.2–1.2)
Total Protein: 6.8 g/dL (ref 6.0–8.3)

## 2019-08-26 LAB — VITAMIN D 25 HYDROXY (VIT D DEFICIENCY, FRACTURES): VITD: 40.6 ng/mL (ref 30.00–100.00)

## 2019-08-26 LAB — VITAMIN B12: Vitamin B-12: 335 pg/mL (ref 211–911)

## 2019-08-26 LAB — TSH: TSH: 0.62 u[IU]/mL (ref 0.35–4.50)

## 2019-08-26 LAB — CBC
HCT: 36.2 % (ref 36.0–46.0)
Hemoglobin: 12.1 g/dL (ref 12.0–15.0)
MCHC: 33.4 g/dL (ref 30.0–36.0)
MCV: 85.7 fl (ref 78.0–100.0)
Platelets: 209 10*3/uL (ref 150.0–400.0)
RBC: 4.22 Mil/uL (ref 3.87–5.11)
RDW: 20.4 % — ABNORMAL HIGH (ref 11.5–15.5)
WBC: 4.3 10*3/uL (ref 4.0–10.5)

## 2019-08-26 LAB — HEMOGLOBIN A1C: Hgb A1c MFr Bld: 5.9 % (ref 4.6–6.5)

## 2019-08-27 ENCOUNTER — Encounter: Payer: Self-pay | Admitting: Hematology & Oncology

## 2019-08-31 ENCOUNTER — Telehealth: Payer: Self-pay | Admitting: Hematology & Oncology

## 2019-09-03 ENCOUNTER — Other Ambulatory Visit: Payer: Self-pay

## 2019-09-03 ENCOUNTER — Encounter: Payer: Self-pay | Admitting: Family Medicine

## 2019-09-03 ENCOUNTER — Telehealth (INDEPENDENT_AMBULATORY_CARE_PROVIDER_SITE_OTHER): Payer: Medicare Other | Admitting: Family Medicine

## 2019-09-03 VITALS — BP 121/79 | HR 61 | Resp 98 | Ht 61.0 in | Wt 173.0 lb

## 2019-09-03 DIAGNOSIS — D508 Other iron deficiency anemias: Secondary | ICD-10-CM

## 2019-09-03 DIAGNOSIS — I10 Essential (primary) hypertension: Secondary | ICD-10-CM

## 2019-09-03 DIAGNOSIS — K317 Polyp of stomach and duodenum: Secondary | ICD-10-CM

## 2019-09-03 DIAGNOSIS — D649 Anemia, unspecified: Secondary | ICD-10-CM

## 2019-09-03 DIAGNOSIS — E538 Deficiency of other specified B group vitamins: Secondary | ICD-10-CM

## 2019-09-03 DIAGNOSIS — E559 Vitamin D deficiency, unspecified: Secondary | ICD-10-CM

## 2019-09-03 DIAGNOSIS — E785 Hyperlipidemia, unspecified: Secondary | ICD-10-CM

## 2019-09-03 DIAGNOSIS — R739 Hyperglycemia, unspecified: Secondary | ICD-10-CM

## 2019-09-07 ENCOUNTER — Other Ambulatory Visit: Payer: Medicare Other

## 2019-09-07 ENCOUNTER — Ambulatory Visit: Payer: Medicare Other | Admitting: Hematology & Oncology

## 2019-09-07 NOTE — Assessment & Plan Note (Signed)
Supplement and monitor 

## 2019-09-07 NOTE — Progress Notes (Addendum)
Virtual Visit via Video Note  I connected with Carrie Elliott on 09/03/19 at  2:00 PM EDT by a video enabled telemedicine application and verified that I am speaking with the correct person using two identifiers.  Location: Patient: home Provider: office   I discussed the limitations of evaluation and management by telemedicine and the availability of in person appointments. The patient expressed understanding and agreed to proceed.  Kem Boroughs, CMA was able to set the patient up on a video visit.    Subjective:    Patient ID: Carrie Elliott, female    DOB: 1950/12/14, 69 y.o.   MRN: [id number]  Chief Complaint  Patient presents with  . Hypertension    132- highest , 38- highest m 105-lowest , 76-lowest    HPI Patient is in today for follow up on chronic medical concerns. No recent febrile illness or hospitalizations. She acknowledges she has not been eating an ideally heart healthy diet. Her blood pressure has been improved. She was struggling with headaches for about 3 days but this is improved now. Denies CP/palp/SOB/congestion/fevers/GI or GU c/o. Taking meds as prescribed  Past Medical History:  Diagnosis Date  . Allergic rhinitis   . Allergy   . Anemia 08/24/2014  . Arthritis    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, shoulders, knees  . Benign paroxysmal positional vertigo 07/12/2013  . Chicken pox   . Cystocele    grade 2  . Endometriosis    with Menometrorhaghia   . Gastric polyps 09/30/2015  . Gastroesophageal reflux disease with hiatal hernia    Cough Sees Dr Laretta Bolster   . GERD (gastroesophageal reflux disease)   . Hemorrhoids   . History of colonic polyps   . History of colonic polyps 09/30/2015   adenoma  . HTN (hypertension) 04/23/2013  . Hypocalcemia 09/30/2015  . Hyponatremia 11/23/2016  . IBS (irritable bowel syndrome)   . Iron deficiency anemia 06/11/2019  . 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 Measles   . Mumps   . Muscle spasm 09/30/2015  . Osteopenia 10/09/2015  . Rectocele     grade 2  . Reflux    cough  . Rosacea   . Sternal fracture    1986  . Sun-damaged skin 09/05/2014  . Vertigo 11/23/2016  . Vitamin D deficiency 04/06/2016  . 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 APPENDECTOMY  1989  . atypical nevus excision    . BREAST BIOPSY Right   . CHOLECYSTECTOMY  1989  . COLONOSCOPY  2011  . COLONOSCOPY WITH PROPOFOL N/A 05/27/2015   Procedure: COLONOSCOPY WITH PROPOFOL;  Surgeon: Clarene Essex, MD;  Location: [facility];  Service: Endoscopy;  Laterality: N/A;  . CYSTOSCOPY W/ URETERAL STENT REMOVAL  1999  . ESOPHAGOGASTRODUODENOSCOPY  2011   with esophageal stent, perforation   . ESOPHAGOGASTRODUODENOSCOPY (EGD) WITH PROPOFOL N/A 05/27/2015   Procedure: ESOPHAGOGASTRODUODENOSCOPY (EGD) WITH PROPOFOL;  Surgeon: Clarene Essex, MD;  Location: [facility];  Service: Endoscopy;  Laterality: N/A;  . HEMORRHOID SURGERY  2005  . LEFT OOPHORECTOMY  5361   complicated by ureteral injuery requiring a ureteral stent   . MMK / ANTERIOR VESICOURETHROPEXY / URETHROPEXY  2000  . TONSILLECTOMY AND ADENOIDECTOMY    . TOTAL ABDOMINAL HYSTERECTOMY  2000  . TUBAL LIGATION  1981  . wisdom teeth removal  1964    Family History  Problem Relation Age of Onset  . Colon polyps Father   . Hypertension Father   .  Heart failure Father   . Melanoma Father        multiple  . Heart attack Father        x2  . Heart disease Father        Bundle Block  . Meniere's disease Father   . Cancer Father        melanoma  . Osteoporosis Mother   . Transient ischemic attack Mother   . Cancer Mother        colon  . Heart disease Mother        afib  . Hyperlipidemia Mother   . Diabetes Paternal Grandmother   . Stroke Maternal Grandmother   . Cancer Maternal Grandmother        skin, melanoma, sarcoma  . Colon polyps Maternal Grandmother   . Diverticulitis Maternal Grandmother   . Stroke Maternal Grandfather   . Heart disease Maternal  Grandfather   . Obesity Brother     Social History   Socioeconomic History  . Marital status: Married    Spouse name: Not on file  . Number of children: Not on file  . Years of education: Not on file  . Highest education level: Not on file  Occupational History  . Not on file  Tobacco Use  . Smoking status: Never Smoker  . Smokeless tobacco: Never Used  Vaping Use  . Vaping Use: Never used  Substance and Sexual Activity  . Alcohol use: No  . Drug use: No  . 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 Not on file  Social History Narrative  . Not on file   Social Determinants of Health   Financial Resource Strain:   . Difficulty of Paying Living Expenses:   Food Insecurity:   . Worried About Charity fundraiser in the Last Year:   . Arboriculturist in the Last Year:   Transportation Needs:   . Film/video editor (Medical):   Marland Kitchen Lack of Transportation (Non-Medical):   Physical Activity:   . Days of Exercise per Week:   . Minutes of Exercise per Session:   Stress:   . Feeling of Stress :   Social Connections:   . Frequency of Communication with Friends and Family:   . Frequency of Social Gatherings with Friends and Family:   . Attends Religious Services:   . Active Member of Clubs or Organizations:   . Attends Archivist Meetings:   Marland Kitchen Marital Status:   Intimate Partner Violence:   . Fear of Current or Ex-Partner:   . Emotionally Abused:   Marland Kitchen Physically Abused:   . Sexually Abused:     Outpatient Medications Prior to Visit  Medication Sig Dispense Refill  . acetaminophen (TYLENOL) 500 MG tablet Take 1,000 mg by mouth every 6 (six) hours as needed for headache (pain).    Marland Kitchen aspirin EC 81 MG tablet Take 81 mg by mouth daily. 07/23/2019 ASA on hold per Dr. Randel Pigg.    . benzocaine (ORAJEL) 10 % mucosal gel Use as directed 1 application in the mouth or throat as needed for  mouth pain. Reported on 07/22/2015    . Cholecalciferol (VITAMIN D3) 50 MCG (2000 UT) TABS Take by mouth. Take 1 tablet daily Monday-Friday. Take 2 tablets on Saturday.    . esomeprazole (NEXIUM) 40 MG capsule Take by mouth daily.   3  . famotidine (PEPCID) 20  MG tablet 2 (two) times daily as needed.     . hydrochlorothiazide (HYDRODIURIL) 12.5 MG tablet Take 12.5 mg by mouth daily. Take if BP> 110/70    . Lidocaine-Hydrocortisone Ace 3-0.5 % CREA Apply 1 application topically daily as needed (hemmoriods.).     Marland Kitchen lisinopril (ZESTRIL) 10 MG tablet TAKE 1 TABLET BY MOUTH TWICE DAILY 180 tablet 1  . metoprolol succinate (TOPROL-XL) 50 MG 24 hr tablet Take 1-2 tablets (50-100 mg total) by mouth 2 (two) times daily. Take with or immediately following a meal. 360 tablet 1  . SIMPLY SALINE NA Place 1 each into the nose daily.      No facility-administered medications prior to visit.    Allergies  Allergen Reactions  . Pseudoephedrine Hcl Er Other (See Comments)    TACHYCARDIA   . Singulair [Montelukast Sodium] Other (See Comments)    Mental status changes, irritable  . Cefdinir Hives  . Celebrex [Celecoxib] Other (See Comments)    ABDOMINAL PAIN   . Ciprofloxacin Other (See Comments)    ABDOMINAL PAIN   . Codeine Nausea And Vomiting    NAUSEA VOMITING - can take with zofran    Review of Systems  Constitutional: Negative for fever and malaise/fatigue.  HENT: Negative for congestion.   Eyes: Negative for blurred vision.  Respiratory: Negative for shortness of breath.   Cardiovascular: Negative for chest pain, palpitations and leg swelling.  Gastrointestinal: Negative for abdominal pain, blood in stool and nausea.  Genitourinary: Negative for dysuria and frequency.  Musculoskeletal: Negative for falls and joint pain.  Skin: Negative for rash.  Neurological: Positive for headaches. Negative for dizziness and loss of consciousness.  Endo/Heme/Allergies: Negative for environmental allergies.   Psychiatric/Behavioral: Negative for depression. The patient is not nervous/anxious.        Objective:    Physical Exam Constitutional:      Appearance: Normal appearance. She is not ill-appearing.  HENT:     Head: Normocephalic and atraumatic.     Right Ear: External ear normal.     Left Ear: External ear normal.     Nose: Nose normal.  Pulmonary:     Effort: Pulmonary effort is normal.  Neurological:     Mental Status: She is alert and oriented to person, place, and time.  Psychiatric:        Behavior: Behavior normal.     BP 121/79 (BP Location: Left Arm, Patient Position: Sitting)   Pulse 61   Resp (!) 98   Ht 5\' 1"  (1.549 m)   Wt 173 lb (78.5 kg)   BMI 32.69 kg/m  Wt Readings from Last 3 Encounters:  09/03/19 173 lb (78.5 kg)  07/23/19 174 lb 1.9 oz (79 kg)  07/16/19 171 lb (77.6 kg)    Diabetic Foot Exam - Simple   No data filed     Lab Results  Component Value Date   WBC 4.3 08/26/2019   HGB 12.1 08/26/2019   HCT 36.2 08/26/2019   PLT 209.0 08/26/2019   GLUCOSE 97 08/26/2019   CHOL 203 (H) 08/26/2019   TRIG 133.0 08/26/2019   HDL 51.90 08/26/2019   LDLDIRECT 106.0 02/18/2018   LDLCALC 125 (H) 08/26/2019   ALT 19 08/26/2019   AST 16 08/26/2019   NA 136 08/26/2019   K 4.0 08/26/2019   CL 103 08/26/2019   CREATININE 0.93 08/26/2019   BUN 17 08/26/2019   CO2 27 08/26/2019   TSH 0.62 08/26/2019   INR 1.19 02/07/2010  HGBA1C 5.9 08/26/2019    Lab Results  Component Value Date   TSH 0.62 08/26/2019   Lab Results  Component Value Date   WBC 4.3 08/26/2019   HGB 12.1 08/26/2019   HCT 36.2 08/26/2019   MCV 85.7 08/26/2019   PLT 209.0 08/26/2019   Lab Results  Component Value Date   NA 136 08/26/2019   K 4.0 08/26/2019   CO2 27 08/26/2019   GLUCOSE 97 08/26/2019   BUN 17 08/26/2019   CREATININE 0.93 08/26/2019   BILITOT 0.4 08/26/2019   ALKPHOS 52 08/26/2019   AST 16 08/26/2019   ALT 19 08/26/2019   PROT 6.8 08/26/2019   ALBUMIN  4.1 08/26/2019   CALCIUM 9.2 08/26/2019   ANIONGAP 8 07/23/2019   GFR 59.79 (L) 08/26/2019   Lab Results  Component Value Date   CHOL 203 (H) 08/26/2019   Lab Results  Component Value Date   HDL 51.90 08/26/2019   Lab Results  Component Value Date   LDLCALC 125 (H) 08/26/2019   Lab Results  Component Value Date   TRIG 133.0 08/26/2019   Lab Results  Component Value Date   CHOLHDL 4 08/26/2019   Lab Results  Component Value Date   HGBA1C 5.9 08/26/2019       Assessment & Plan:   Problem List Items Addressed This Visit    HTN (hypertension) - Primary (Chronic)    Well controlled, no changes to meds. Encouraged heart healthy diet such as the DASH diet and exercise as tolerated.       Relevant Orders   CBC   Comprehensive metabolic panel   Lipid panel   TSH   Iron deficiency anemia (Chronic)   Anemia   Gastric polyps   Vitamin D deficiency    Supplement and monitor      Relevant Orders   VITAMIN D 25 Hydroxy (Vit-D Deficiency, Fractures)   Hyperlipidemia    encouraged heart healthy diet, avoid trans fats, minimize simple carbs and saturated fats. Increase exercise as tolerated      Hyperglycemia    hgba1c acceptable, minimize simple carbs. Increase exercise as tolerated.       Relevant Orders   Hemoglobin A1c   Vitamin B12 deficiency    Supplement and monitor      Relevant Orders   Vitamin B12      I am having Christean Grief. Hinely maintain her acetaminophen, benzocaine, Lidocaine-Hydrocortisone Ace, SIMPLY SALINE NA, famotidine, esomeprazole, aspirin EC, Vitamin D3, hydrochlorothiazide, metoprolol succinate, and lisinopril.  No orders of the defined types were placed in this encounter.  I discussed the assessment and treatment plan with the patient. The patient was provided an opportunity to ask questions and all were answered. The patient agreed with the plan and demonstrated an understanding of the instructions.   The patient was advised to call  back or seek an in-person evaluation if the symptoms worsen or if the condition fails to improve as anticipated.  I provided 20 minutes of non-face-to-face time during this encounter.   Penni Homans, MD

## 2019-09-07 NOTE — Assessment & Plan Note (Signed)
hgba1c acceptable, minimize simple carbs. Increase exercise as tolerated.  

## 2019-09-07 NOTE — Assessment & Plan Note (Signed)
Well controlled, no changes to meds. Encouraged heart healthy diet such as the DASH diet and exercise as tolerated.  °

## 2019-09-07 NOTE — Assessment & Plan Note (Signed)
encouraged heart healthy diet, avoid trans fats, minimize simple carbs and saturated fats. Increase exercise as tolerated 

## 2019-09-08 ENCOUNTER — Other Ambulatory Visit: Payer: Self-pay

## 2019-09-08 ENCOUNTER — Encounter: Payer: Self-pay | Admitting: Pulmonary Disease

## 2019-09-08 ENCOUNTER — Ambulatory Visit (INDEPENDENT_AMBULATORY_CARE_PROVIDER_SITE_OTHER): Payer: Medicare Other | Admitting: Pulmonary Disease

## 2019-09-08 VITALS — BP 126/76 | HR 81 | Temp 97.9°F | Ht 61.0 in | Wt 176.4 lb

## 2019-09-08 DIAGNOSIS — Z9989 Dependence on other enabling machines and devices: Secondary | ICD-10-CM

## 2019-09-08 DIAGNOSIS — G4733 Obstructive sleep apnea (adult) (pediatric): Secondary | ICD-10-CM

## 2019-09-08 NOTE — Patient Instructions (Signed)
Adequately treated mild obstructive sleep apnea  Continue using CPAP on a regular basis  I will follow-up with you in 6 months  Call with significant concerns

## 2019-09-08 NOTE — Progress Notes (Signed)
Subjective:     Patient ID: Carrie Elliott, female   DOB: 10/04/1950, 69 y.o.   MRN: [id number]  Follow-up for mild obstructive sleep apnea On treatment with CPAP  Good tolerance to CPAP so far Improved daytime activities Improved energy levels  Machine seems to be working well  Sleep pattern has not really changed much Usually tries to go to bed between 10 and 10:30 PM Takes about 30 minutes to 1 hour to fall asleep Wakes up about once to 3 times during the night Wakes up about 6 AM on workdays  Hypertension Never smoked  Not having any issues  Review of Systems  Constitutional: Negative.   Eyes: Negative.   Respiratory: Positive for apnea. Negative for cough.   Gastrointestinal: Negative.   Endocrine: Negative.   Genitourinary: Negative.   Musculoskeletal: Negative.   Skin: Negative.   Hematological: Negative.   Psychiatric/Behavioral: Positive for sleep disturbance.  All other systems reviewed and are negative.  Past Medical History:  Diagnosis Date  . Allergic rhinitis   . Allergy   . Anemia 08/24/2014  . Arthritis    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, shoulders, knees  . Benign paroxysmal positional vertigo 07/12/2013  . Chicken pox   . Cystocele    grade 2  . Endometriosis    with Menometrorhaghia   . Gastric polyps 09/30/2015  . Gastroesophageal reflux disease with hiatal hernia    Cough Sees Dr Laretta Bolster   . GERD (gastroesophageal reflux disease)   . Hemorrhoids   . History of colonic polyps   . History of colonic polyps 09/30/2015   adenoma  . HTN (hypertension) 04/23/2013  . Hypocalcemia 09/30/2015  . Hyponatremia 11/23/2016  . IBS (irritable bowel syndrome)   . Iron deficiency anemia 06/11/2019  . 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 Measles   . Mumps   . Muscle spasm 09/30/2015  . Osteopenia 10/09/2015  . Rectocele    grade 2  . Reflux    cough  . Rosacea   . Sternal fracture    1986  . Sun-damaged skin 09/05/2014  . Vertigo 11/23/2016  . Vitamin D deficiency 04/06/2016   . 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 Marital status: Married    Spouse name: Not on file  . Number of children: Not on file  . Years of education: Not on file  . Highest education level: Not on file  Occupational History  . Not on file  Tobacco Use  . Smoking status: Never Smoker  . Smokeless tobacco: Never Used  Vaping Use  . Vaping Use: Never used  Substance and Sexual Activity  . Alcohol use: No  . Drug use: No  . 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 Not on file  Social History Narrative  . Not on file   Social Determinants of Health   Financial Resource Strain:   . Difficulty of Paying Living Expenses:   Food Insecurity:   . Worried About Charity fundraiser in the Last Year:   . Arboriculturist in the Last Year:   Transportation Needs:   . Film/video editor (Medical):   Marland Kitchen Lack of Transportation (Non-Medical):   Physical Activity:   . Days of Exercise per Week:   . Minutes of Exercise per Session:   Stress:   . Feeling of Stress :   Social Connections:   .  Frequency of Communication with Friends and Family:   . Frequency of Social Gatherings with Friends and Family:   . Attends Religious Services:   . Active Member of Clubs or Organizations:   . Attends Archivist Meetings:   Marland Kitchen Marital Status:   Intimate Partner Violence:   . Fear of Current or Ex-Partner:   . Emotionally Abused:   Marland Kitchen Physically Abused:   . Sexually Abused:    Family History  Problem Relation Age of Onset  . Colon polyps Father   . Hypertension Father   . Heart failure Father   . Melanoma Father        multiple  . Heart attack Father        x2  . Heart disease Father        Bundle Block  . Meniere's disease Father   . Cancer Father        melanoma  . Osteoporosis Mother   . Transient ischemic attack Mother   . Cancer Mother        colon   . Heart disease Mother        afib  . Hyperlipidemia Mother   . Diabetes Paternal Grandmother   . Stroke Maternal Grandmother   . Cancer Maternal Grandmother        skin, melanoma, sarcoma  . Colon polyps Maternal Grandmother   . Diverticulitis Maternal Grandmother   . Stroke Maternal Grandfather   . Heart disease Maternal Grandfather   . Obesity Brother        Objective:   Physical Exam Constitutional:      Appearance: Normal appearance. She is obese.  HENT:     Head: Normocephalic and atraumatic.     Mouth/Throat:     Comments: Crowded oropharynx, Mallampati 2 Cardiovascular:     Rate and Rhythm: Normal rate and regular rhythm.     Heart sounds: No murmur heard.   Pulmonary:     Effort: Pulmonary effort is normal. No respiratory distress.     Breath sounds: Normal breath sounds. No stridor. No wheezing or rhonchi.  Musculoskeletal:     Cervical back: No rigidity. No muscular tenderness.  Neurological:     Mental Status: She is alert.     Vitals:   09/08/19 1133  BP: 126/76  Pulse: 81  Temp: 97.9 F (36.6 C)  SpO2: 96%   Results of the Epworth flowsheet 09/30/2018  Sitting and reading 1  Watching TV 1  Sitting, inactive in a public place (e.g. a theatre or a meeting) 1  As a passenger in a car for an hour without a break 2  Lying down to rest in the afternoon when circumstances permit 2  Sitting and talking to someone 0  Sitting quietly after a lunch without alcohol 1  In a car, while stopped for a few minutes in traffic 0  Total score 8   Sleep study reviewed in epic  Compliance data reviewed showing 100% compliance Average hours of 7 hours 53 minutes Machine set between 5 and 15 with 95 percentile pressure of 12.2 Residual AHI of 2.5    Assessment:     Mild obstructive sleep apnea -Improved symptoms with CPAP use -Currently on auto titrating CPAP  Excessive daytime sleepiness -Improvement in symptoms  Continues to benefit from CPAP use     Plan:     Importance of weight loss and weight maintenance discussed with the patient  Continue using CPAP as ordered  I will see  her back in the office in 6 months  Encouraged to call with any significant concerns  If doing well at the next visit then we will see on a yearly basis

## 2019-09-29 MED FILL — HYDROCHLOROTHIAZIDE 12.5 MG: 12.5 | 90 days supply | Qty: 90 | Fill #2

## 2019-09-29 MED FILL — ESOMEPRAZOLE MAG DR 40 MG C: 40 | 30 days supply | Qty: 30 | Fill #7

## 2019-10-07 ENCOUNTER — Other Ambulatory Visit: Payer: Medicare Other

## 2019-10-07 ENCOUNTER — Ambulatory Visit: Payer: Medicare Other | Admitting: Hematology & Oncology

## 2019-10-12 MED FILL — FAMOTIDINE 20 MG TABS: 20 | 30 days supply | Qty: 60 | Fill #2

## 2019-10-13 ENCOUNTER — Inpatient Hospital Stay: Payer: Medicare Other | Attending: Hematology & Oncology

## 2019-10-13 ENCOUNTER — Encounter: Payer: Self-pay | Admitting: Hematology & Oncology

## 2019-10-13 ENCOUNTER — Inpatient Hospital Stay (HOSPITAL_BASED_OUTPATIENT_CLINIC_OR_DEPARTMENT_OTHER): Payer: Medicare Other | Admitting: Hematology & Oncology

## 2019-10-13 ENCOUNTER — Other Ambulatory Visit: Payer: Self-pay

## 2019-10-13 VITALS — BP 131/73 | HR 74 | Temp 98.1°F | Resp 19 | Wt 177.0 lb

## 2019-10-13 DIAGNOSIS — D500 Iron deficiency anemia secondary to blood loss (chronic): Secondary | ICD-10-CM | POA: Diagnosis not present

## 2019-10-13 DIAGNOSIS — Z79899 Other long term (current) drug therapy: Secondary | ICD-10-CM | POA: Diagnosis not present

## 2019-10-13 DIAGNOSIS — Z7982 Long term (current) use of aspirin: Secondary | ICD-10-CM | POA: Insufficient documentation

## 2019-10-13 DIAGNOSIS — D509 Iron deficiency anemia, unspecified: Secondary | ICD-10-CM | POA: Insufficient documentation

## 2019-10-13 DIAGNOSIS — K909 Intestinal malabsorption, unspecified: Secondary | ICD-10-CM

## 2019-10-13 LAB — CMP (CANCER CENTER ONLY)
ALT: 17 U/L (ref 0–44)
AST: 16 U/L (ref 15–41)
Albumin: 4.5 g/dL (ref 3.5–5.0)
Alkaline Phosphatase: 54 U/L (ref 38–126)
Anion gap: 8 (ref 5–15)
BUN: 16 mg/dL (ref 8–23)
CO2: 26 mmol/L (ref 22–32)
Calcium: 9.9 mg/dL (ref 8.9–10.3)
Chloride: 102 mmol/L (ref 98–111)
Creatinine: 1.07 mg/dL — ABNORMAL HIGH (ref 0.44–1.00)
GFR, Est AFR Am: >60 mL/min (ref >60)
GFR, Estimated: 53 mL/min — ABNORMAL LOW (ref >60)
Glucose, Bld: 119 mg/dL — ABNORMAL HIGH (ref 70–99)
Potassium: 3.6 mmol/L (ref 3.5–5.1)
Sodium: 136 mmol/L (ref 135–145)
Total Bilirubin: 0.4 mg/dL (ref 0.3–1.2)
Total Protein: 7.5 g/dL (ref 6.5–8.1)

## 2019-10-13 LAB — CBC WITH DIFFERENTIAL (CANCER CENTER ONLY)
Abs Immature Granulocytes: 0.04 10*3/uL (ref 0.00–0.07)
Basophils Absolute: 0 10*3/uL (ref 0.0–0.1)
Basophils Relative: 0 %
Eosinophils Absolute: 0.1 10*3/uL (ref 0.0–0.5)
Eosinophils Relative: 2 %
HCT: 38.4 % (ref 36.0–46.0)
Hemoglobin: 12.5 g/dL (ref 12.0–15.0)
Immature Granulocytes: 1 %
Lymphocytes Relative: 23 %
Lymphs Abs: 1.6 10*3/uL (ref 0.7–4.0)
MCH: 29.6 pg (ref 26.0–34.0)
MCHC: 32.6 g/dL (ref 30.0–36.0)
MCV: 90.8 fL (ref 80.0–100.0)
Monocytes Absolute: 0.5 10*3/uL (ref 0.1–1.0)
Monocytes Relative: 8 %
Neutro Abs: 4.5 10*3/uL (ref 1.7–7.7)
Neutrophils Relative %: 66 %
Platelet Count: 242 10*3/uL (ref 150–400)
RBC: 4.23 MIL/uL (ref 3.87–5.11)
RDW: 14.3 % (ref 11.5–15.5)
WBC Count: 6.8 10*3/uL (ref 4.0–10.5)
nRBC: 0 % (ref 0.0–0.2)

## 2019-10-13 NOTE — Progress Notes (Signed)
Hematology and Oncology Follow Up Visit  [name] [id number] 09-11-1950 69 y.o. 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 is back for follow-up.  She is doing quite well.  She has been spending time with her grandchildren.  She has 6 grandchildren.  She feels well.  She is still working part-time.  It sounds like she might be winding down her job.  She is a Designer, jewellery for some clinical trials.  We last saw her back in April, her iron studies showed a ferritin of 308 with an iron saturation of 25%.  She has had no issues with nausea or vomiting.  There is been no change in bowel or bladder habits.  She had no rashes.  She has had no cough or shortness of breath.    Overall, her performance status is ECOG 0.  Medications:  Current Outpatient Medications:  .  acetaminophen (TYLENOL) 500 MG tablet, Take 1,000 mg by mouth every 6 (six) hours as needed for headache (pain)., Disp: , Rfl:  .  aspirin EC 81 MG tablet, Take 81 mg by mouth daily. 07/23/2019 ASA on hold per Dr. Randel Pigg., Disp: , Rfl:  .  benzocaine (ORAJEL) 10 % mucosal gel, Use as directed 1 application in the mouth or throat as needed for mouth pain. Reported on 07/22/2015, Disp: , Rfl:  .  Cholecalciferol (VITAMIN D3) 50 MCG (2000 UT) TABS, Take by mouth. Take 1 tablet daily Monday-Friday. Take 2 tablets on Saturday., Disp: , Rfl:  .  esomeprazole (NEXIUM) 40 MG capsule, Take by mouth daily. , Disp: , Rfl: 3 .  famotidine (PEPCID) 20 MG tablet, 2 (two) times daily as needed. , Disp: , Rfl:  .  hydrochlorothiazide (HYDRODIURIL) 12.5 MG tablet, Take 12.5 mg by mouth daily. Take if BP> 110/70, Disp: , Rfl:  .  Lidocaine-Hydrocortisone Ace 3-0.5 % CREA, Apply 1 application topically daily as needed (hemmoriods.). , Disp: , Rfl:  .  lisinopril (ZESTRIL) 10 MG tablet, TAKE 1 TABLET BY  MOUTH TWICE DAILY, Disp: 180 tablet, Rfl: 1 .  metoprolol succinate (TOPROL-XL) 50 MG 24 hr tablet, Take 1-2 tablets (50-100 mg total) by mouth 2 (two) times daily. Take with or immediately following a meal., Disp: 360 tablet, Rfl: 1 .  SIMPLY SALINE NA, Place 1 each into the nose daily. , Disp: , Rfl:   Allergies:  Allergies  Allergen Reactions  . Pseudoephedrine Hcl Er Other (See Comments)    TACHYCARDIA   . Singulair [Montelukast Sodium] Other (See Comments)    Mental status changes, irritable  . Cefdinir Hives  . Celebrex [Celecoxib] Other (See Comments)    ABDOMINAL PAIN   . Ciprofloxacin Other (See Comments)    ABDOMINAL PAIN   . Codeine Nausea And Vomiting    NAUSEA VOMITING - can take with zofran    Past Medical History, Surgical history, Social history, and Family History were reviewed and updated.  Review of Systems: Review of Systems  Constitutional: Negative.   HENT:  Negative.   Eyes: Negative.   Respiratory: Negative.   Cardiovascular: Negative.   Gastrointestinal: Negative.   Endocrine: Negative.   Genitourinary: Negative.    Musculoskeletal: Negative.   Skin: Negative.   Neurological: Negative.   Hematological: Negative.   Psychiatric/Behavioral: Negative.     Physical Exam:  weight is 177 lb (80.3 kg).  Her oral temperature is 98.1 F (36.7 C). Her blood pressure is 131/73 and her pulse is 74. Her respiration is 19 and oxygen saturation is 98%.   Wt Readings from Last 3 Encounters:  10/13/19 177 lb (80.3 kg)  09/08/19 176 lb 6.4 oz (80 kg)  09/03/19 173 lb (78.5 kg)    Physical Exam Vitals reviewed.  HENT:     Head: Normocephalic and atraumatic.  Eyes:     Pupils: Pupils are equal, round, and reactive to light.  Cardiovascular:     Rate and Rhythm: Normal rate and regular rhythm.     Heart sounds: Normal heart sounds.  Pulmonary:     Effort: Pulmonary effort is normal.     Breath sounds: Normal breath sounds.  Abdominal:     General: Bowel  sounds are normal.     Palpations: Abdomen is soft.  Musculoskeletal:        General: No tenderness or deformity. Normal range of motion.     Cervical back: Normal range of motion.  Lymphadenopathy:     Cervical: No cervical adenopathy.  Skin:    General: Skin is warm and dry.     Findings: No erythema or rash.  Neurological:     Mental Status: She is alert and oriented to person, place, and time.  Psychiatric:        Behavior: Behavior normal.        Thought Content: Thought content normal.        Judgment: Judgment normal.      Lab Results  Component Value Date   WBC 6.8 10/13/2019   HGB 12.5 10/13/2019   HCT 38.4 10/13/2019   MCV 90.8 10/13/2019   PLT 242 10/13/2019     Chemistry      Component Value Date/Time   NA 136 10/13/2019 0958   K 3.6 10/13/2019 0958   CL 102 10/13/2019 0958   CO2 26 10/13/2019 0958   BUN 16 10/13/2019 0958   CREATININE 1.07 (H) 10/13/2019 0958      Component Value Date/Time   CALCIUM 9.9 10/13/2019 0958   ALKPHOS 54 10/13/2019 0958   AST 16 10/13/2019 0958   ALT 17 10/13/2019 0958   BILITOT 0.4 10/13/2019 0958       Impression and Plan: [name] is a very nice 69 year old white female.  She has iron deficiency anemia.  She has some iron malabsorption issues.  Again, her hemoglobin has normalized.  The MCV has gone up.  I believe that her iron studies are back to normal now.  Since she is doing well, I do not think we have to get her back.  I told her that she can was come back if she has any problems.  We will be more than happy to see her and do blood work on her.  I love her faith.  She certainly has a strong faith and it shows.   [name], MD 7/20/202111:03 AM

## 2019-10-14 ENCOUNTER — Telehealth: Payer: Self-pay | Admitting: Hematology & Oncology

## 2019-10-14 ENCOUNTER — Telehealth: Payer: Self-pay | Admitting: *Deleted

## 2019-10-14 LAB — IRON AND TIBC
Iron: 87 ug/dL (ref 41–142)
Saturation Ratios: 27 % (ref 21–57)
TIBC: 318 ug/dL (ref 236–444)
UIBC: 230 ug/dL (ref 120–384)

## 2019-10-14 LAB — FERRITIN: Ferritin: 128 ng/mL (ref 11–307)

## 2019-10-14 NOTE — Telephone Encounter (Signed)
As noted below by Dr. Ennever, I informed the patient that her iron level is OK. She verbalized understanding. °

## 2019-10-14 NOTE — Telephone Encounter (Signed)
No los 7/20  

## 2019-10-14 NOTE — Telephone Encounter (Signed)
-----   Message from [name], MD sent at 10/14/2019 10:14 AM EDT ----- Call - the iron level is ok1!!  Carrie Elliott

## 2019-10-15 MED FILL — METOPROLOL SUCCINATE ER 50: 50 | 90 days supply | Qty: 360 | Fill #0

## 2019-10-27 MED FILL — ESOMEPRAZOLE MAG DR 40 MG C: 40 | 30 days supply | Qty: 30 | Fill #8

## 2019-11-17 DIAGNOSIS — H2513 Age-related nuclear cataract, bilateral: Secondary | ICD-10-CM | POA: Diagnosis not present

## 2019-11-17 DIAGNOSIS — H52203 Unspecified astigmatism, bilateral: Secondary | ICD-10-CM | POA: Diagnosis not present

## 2019-11-17 MED FILL — LISINOPRIL 10 MG TABS: 10 | 90 days supply | Qty: 180 | Fill #1

## 2019-11-17 MED FILL — FAMOTIDINE 20 MG TABS: 20 | 30 days supply | Qty: 60 | Fill #3

## 2019-11-27 NOTE — Addendum Note (Signed)
Addended by: Kelle Darting A on: 11/27/2019 11:00 AM   Modules accepted: Orders

## 2019-12-01 MED FILL — ESOMEPRAZOLE MAG DR 40 MG C: 40 | 30 days supply | Qty: 30 | Fill #9

## 2019-12-02 ENCOUNTER — Other Ambulatory Visit (INDEPENDENT_AMBULATORY_CARE_PROVIDER_SITE_OTHER): Payer: Medicare Other

## 2019-12-02 ENCOUNTER — Other Ambulatory Visit: Payer: Self-pay

## 2019-12-02 DIAGNOSIS — R739 Hyperglycemia, unspecified: Secondary | ICD-10-CM | POA: Diagnosis not present

## 2019-12-02 DIAGNOSIS — E538 Deficiency of other specified B group vitamins: Secondary | ICD-10-CM | POA: Diagnosis not present

## 2019-12-02 DIAGNOSIS — I10 Essential (primary) hypertension: Secondary | ICD-10-CM | POA: Diagnosis not present

## 2019-12-02 DIAGNOSIS — E559 Vitamin D deficiency, unspecified: Secondary | ICD-10-CM | POA: Diagnosis not present

## 2019-12-03 LAB — VITAMIN B12: Vitamin B-12: 535 pg/mL (ref 200–1100)

## 2019-12-03 LAB — LIPID PANEL
Cholesterol: 216 mg/dL — ABNORMAL HIGH (ref <200)
HDL: 55 mg/dL (ref >=50)
LDL Cholesterol (Calc): 127 mg/dL (calc) — ABNORMAL HIGH
Non-HDL Cholesterol (Calc): 161 mg/dL (calc) — ABNORMAL HIGH (ref <130)
Total CHOL/HDL Ratio: 3.9 (calc) (ref <5.0)
Triglycerides: 195 mg/dL — ABNORMAL HIGH (ref <150)

## 2019-12-03 LAB — COMPREHENSIVE METABOLIC PANEL
AG Ratio: 1.4 (calc) (ref 1.0–2.5)
ALT: 16 U/L (ref 6–29)
AST: 16 U/L (ref 10–35)
Albumin: 4.2 g/dL (ref 3.6–5.1)
Alkaline phosphatase (APISO): 51 U/L (ref 37–153)
BUN/Creatinine Ratio: 17 (calc) (ref 6–22)
BUN: 19 mg/dL (ref 7–25)
CO2: 26 mmol/L (ref 20–32)
Calcium: 9.6 mg/dL (ref 8.6–10.4)
Chloride: 100 mmol/L (ref 98–110)
Creat: 1.1 mg/dL — ABNORMAL HIGH (ref 0.50–0.99)
Globulin: 2.9 g/dL (calc) (ref 1.9–3.7)
Glucose, Bld: 93 mg/dL (ref 65–99)
Potassium: 4.3 mmol/L (ref 3.5–5.3)
Sodium: 135 mmol/L (ref 135–146)
Total Bilirubin: 0.4 mg/dL (ref 0.2–1.2)
Total Protein: 7.1 g/dL (ref 6.1–8.1)

## 2019-12-03 LAB — CBC
HCT: 38.5 % (ref 35.0–45.0)
Hemoglobin: 12.9 g/dL (ref 11.7–15.5)
MCH: 30.2 pg (ref 27.0–33.0)
MCHC: 33.5 g/dL (ref 32.0–36.0)
MCV: 90.2 fL (ref 80.0–100.0)
MPV: 10.3 fL (ref 7.5–12.5)
Platelets: 252 10*3/uL (ref 140–400)
RBC: 4.27 10*6/uL (ref 3.80–5.10)
RDW: 12 % (ref 11.0–15.0)
WBC: 5.8 10*3/uL (ref 3.8–10.8)

## 2019-12-03 LAB — HEMOGLOBIN A1C
Hgb A1c MFr Bld: 5.5 % of total Hgb (ref <5.7)
Mean Plasma Glucose: 111 (calc)
eAG (mmol/L): 6.2 (calc)

## 2019-12-03 LAB — TSH: TSH: 1.29 mIU/L (ref 0.40–4.50)

## 2019-12-03 LAB — VITAMIN D 25 HYDROXY (VIT D DEFICIENCY, FRACTURES): Vit D, 25-Hydroxy: 36 ng/mL (ref 30–100)

## 2019-12-08 ENCOUNTER — Encounter: Payer: Self-pay | Admitting: Family Medicine

## 2019-12-08 ENCOUNTER — Ambulatory Visit (INDEPENDENT_AMBULATORY_CARE_PROVIDER_SITE_OTHER): Payer: Medicare Other | Admitting: Family Medicine

## 2019-12-08 ENCOUNTER — Other Ambulatory Visit: Payer: Self-pay

## 2019-12-08 VITALS — BP 124/63 | HR 76 | Temp 98.2°F | Resp 13 | Ht 61.0 in | Wt 175.4 lb

## 2019-12-08 DIAGNOSIS — E538 Deficiency of other specified B group vitamins: Secondary | ICD-10-CM

## 2019-12-08 DIAGNOSIS — E785 Hyperlipidemia, unspecified: Secondary | ICD-10-CM | POA: Diagnosis not present

## 2019-12-08 DIAGNOSIS — R739 Hyperglycemia, unspecified: Secondary | ICD-10-CM | POA: Diagnosis not present

## 2019-12-08 DIAGNOSIS — I10 Essential (primary) hypertension: Secondary | ICD-10-CM

## 2019-12-08 DIAGNOSIS — K5790 Diverticulosis of intestine, part unspecified, without perforation or abscess without bleeding: Secondary | ICD-10-CM

## 2019-12-08 DIAGNOSIS — E559 Vitamin D deficiency, unspecified: Secondary | ICD-10-CM | POA: Diagnosis not present

## 2019-12-08 MED ORDER — METRONIDAZOLE 500 MG PO TABS
500.0000 mg | ORAL_TABLET | Freq: Three times a day (TID) | ORAL | 0 refills | Status: DC
Start: 2019-12-08 — End: 2020-03-28

## 2019-12-08 MED ORDER — LEVOFLOXACIN 250 MG PO TABS: 250.0000 mg | ORAL_TABLET | Freq: Every day | ORAL | 0 refills | Status: DC

## 2019-12-08 NOTE — Patient Instructions (Signed)
Diverticulitis  Diverticulitis is infection or inflammation of small pouches (diverticula) in the colon that form due to a condition called diverticulosis. Diverticula can trap stool (feces) and bacteria, causing infection and inflammation. Diverticulitis may cause severe stomach pain and diarrhea. It may lead to tissue damage in the colon that causes bleeding. The diverticula may also burst (rupture) and cause infected stool to enter other areas of the abdomen. Complications of diverticulitis can include:  Bleeding.  Severe infection.  Severe pain.  Rupture (perforation) of the colon.  Blockage (obstruction) of the colon. What are the causes? This condition is caused by stool becoming trapped in the diverticula, which allows bacteria to grow in the diverticula. This leads to inflammation and infection. What increases the risk? You are more likely to develop this condition if:  You have diverticulosis. The risk for diverticulosis increases if: ? You are overweight or obese. ? You use tobacco products. ? You do not get enough exercise.  You eat a diet that does not include enough fiber. High-fiber foods include fruits, vegetables, beans, nuts, and whole grains. What are the signs or symptoms? Symptoms of this condition may include:  Pain and tenderness in the abdomen. The pain is normally located on the left side of the abdomen, but it may occur in other areas.  Fever and chills.  Bloating.  Cramping.  Nausea.  Vomiting.  Changes in bowel routines.  Blood in your stool. How is this diagnosed? This condition is diagnosed based on:  Your medical history.  A physical exam.  Tests to make sure there is nothing else causing your condition. These tests may include: ? Blood tests. ? Urine tests. ? Imaging tests of the abdomen, including X-rays, ultrasounds, MRIs, or CT scans. How is this treated? Most cases of this condition are mild and can be treated at home.  Treatment may include:  Taking over-the-counter pain medicines.  Following a clear liquid diet.  Taking antibiotic medicines by mouth.  Rest. More severe cases may need to be treated at a hospital. Treatment may include:  Not eating or drinking.  Taking prescription pain medicine.  Receiving antibiotic medicines through an IV tube.  Receiving fluids and nutrition through an IV tube.  Surgery. When your condition is under control, your health care provider may recommend that you have a colonoscopy. This is an exam to look at the entire large intestine. During the exam, a lubricated, bendable tube is inserted into the anus and then passed into the rectum, colon, and other parts of the large intestine. A colonoscopy can show how severe your diverticula are and whether something else may be causing your symptoms. Follow these instructions at home: Medicines  Take over-the-counter and prescription medicines only as told by your health care provider. These include fiber supplements, probiotics, and stool softeners.  If you were prescribed an antibiotic medicine, take it as told by your health care provider. Do not stop taking the antibiotic even if you start to feel better.  Do not drive or use heavy machinery while taking prescription pain medicine. General instructions   Follow a full liquid diet or another diet as directed by your health care provider. After your symptoms improve, your health care provider may tell you to change your diet. He or she may recommend that you eat a diet that contains at least 25 g (25 grams) of fiber daily. Fiber makes it easier to pass stool. Healthy sources of fiber include: ? Berries. One cup contains 4-8 grams of   fiber. ? Beans or lentils. One half cup contains 5-8 grams of fiber. ? Green vegetables. One cup contains 4 grams of fiber.  Exercise for at least 30 minutes, 3 times each week. You should exercise hard enough to raise your heart rate and  break a sweat.  Keep all follow-up visits as told by your health care provider. This is important. You may need a colonoscopy. Contact a health care provider if:  Your pain does not improve.  You have a hard time drinking or eating food.  Your bowel movements do not return to normal. Get help right away if:  Your pain gets worse.  Your symptoms do not get better with treatment.  Your symptoms suddenly get worse.  You have a fever.  You vomit more than one time.  You have stools that are bloody, black, or tarry. Summary  Diverticulitis is infection or inflammation of small pouches (diverticula) in the colon that form due to a condition called diverticulosis. Diverticula can trap stool (feces) and bacteria, causing infection and inflammation.  You are at higher risk for this condition if you have diverticulosis and you eat a diet that does not include enough fiber.  Most cases of this condition are mild and can be treated at home. More severe cases may need to be treated at a hospital.  When your condition is under control, your health care provider may recommend that you have an exam called a colonoscopy. This exam can show how severe your diverticula are and whether something else may be causing your symptoms. This information is not intended to replace advice given to you by your health care provider. Make sure you discuss any questions you have with your health care provider. Document Revised: 02/22/2017 Document Reviewed: 04/14/2016 Elsevier Patient Education  2020 Elsevier Inc[employer]  

## 2019-12-09 DIAGNOSIS — K5790 Diverticulosis of intestine, part unspecified, without perforation or abscess without bleeding: Secondary | ICD-10-CM | POA: Insufficient documentation

## 2019-12-09 NOTE — Progress Notes (Signed)
Subjective:    Patient ID: Carrie Elliott, female    DOB: 10-15-1950, 69 y.o.   MRN: [id number]  Chief Complaint  Patient presents with  . Follow-up    HPI Patient is in today for follow up on chronic medical concerns. No recent febrile illness or hospitalizations. She has been mostly been feeling well. Then about 3 days ago she began having some intermittent abdominal cramping. No fevers, chills, nausea, vomiting, change in bowel habits. No polyuria or polydipsia. Denies CP/palp/SOB/HA/congestion/fevers or GU c/o. Taking meds as prescribed  Past Medical History:  Diagnosis Date  . Allergic rhinitis   . Allergy   . Anemia 08/24/2014  . Arthritis    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, shoulders, knees  . Benign paroxysmal positional vertigo 07/12/2013  . Chicken pox   . Cystocele    grade 2  . Endometriosis    with Menometrorhaghia   . Gastric polyps 09/30/2015  . Gastroesophageal reflux disease with hiatal hernia    Cough Sees Dr Laretta Bolster   . GERD (gastroesophageal reflux disease)   . Hemorrhoids   . History of colonic polyps   . History of colonic polyps 09/30/2015   adenoma  . HTN (hypertension) 04/23/2013  . Hypocalcemia 09/30/2015  . Hyponatremia 11/23/2016  . IBS (irritable bowel syndrome)   . Iron deficiency anemia 06/11/2019  . 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 Measles   . Mumps   . Muscle spasm 09/30/2015  . Osteopenia 10/09/2015  . Rectocele    grade 2  . Reflux    cough  . Rosacea   . Sternal fracture    1986  . Sun-damaged skin 09/05/2014  . Vertigo 11/23/2016  . Vitamin D deficiency 04/06/2016  . 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 APPENDECTOMY  1989  . atypical nevus excision    . BREAST BIOPSY Right   . CHOLECYSTECTOMY  1989  . COLONOSCOPY  2011  . COLONOSCOPY WITH PROPOFOL N/A 05/27/2015   Procedure: COLONOSCOPY WITH PROPOFOL;  Surgeon: Clarene Essex, MD;  Location: [facility];  Service: Endoscopy;  Laterality: N/A;  . CYSTOSCOPY W/  URETERAL STENT REMOVAL  1999  . ESOPHAGOGASTRODUODENOSCOPY  2011   with esophageal stent, perforation   . ESOPHAGOGASTRODUODENOSCOPY (EGD) WITH PROPOFOL N/A 05/27/2015   Procedure: ESOPHAGOGASTRODUODENOSCOPY (EGD) WITH PROPOFOL;  Surgeon: Clarene Essex, MD;  Location: [facility];  Service: Endoscopy;  Laterality: N/A;  . HEMORRHOID SURGERY  2005  . LEFT OOPHORECTOMY  3007   complicated by ureteral injuery requiring a ureteral stent   . MMK / ANTERIOR VESICOURETHROPEXY / URETHROPEXY  2000  . TONSILLECTOMY AND ADENOIDECTOMY    . TOTAL ABDOMINAL HYSTERECTOMY  2000  . TUBAL LIGATION  1981  . wisdom teeth removal  1964    Family History  Problem Relation Age of Onset  . Colon polyps Father   . Hypertension Father   . Heart failure Father   . Melanoma Father        multiple  . Heart attack Father        x2  . Heart disease Father        Bundle Block  . Meniere's disease Father   . Cancer Father        melanoma  . Osteoporosis Mother   . Transient ischemic attack Mother   . Cancer Mother        colon  . Heart disease Mother        afib  .  Hyperlipidemia Mother   . Diabetes Paternal Grandmother   . Stroke Maternal Grandmother   . Cancer Maternal Grandmother        skin, melanoma, sarcoma  . Colon polyps Maternal Grandmother   . Diverticulitis Maternal Grandmother   . Stroke Maternal Grandfather   . Heart disease Maternal Grandfather   . Obesity Brother     Social History   Socioeconomic History  . Marital status: Married    Spouse name: Not on file  . Number of children: Not on file  . Years of education: Not on file  . Highest education level: Not on file  Occupational History  . Not on file  Tobacco Use  . Smoking status: Never Smoker  . Smokeless tobacco: Never Used  Vaping Use  . Vaping Use: Never used  Substance and Sexual Activity  . Alcohol use: No  . Drug use: No  . 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 Not on file  Social History Narrative  . Not on file   Social Determinants of Health   Financial Resource Strain:   . Difficulty of Paying Living Expenses: Not on file  Food Insecurity:   . Worried About Charity fundraiser in the Last Year: Not on file  . Ran Out of Food in the Last Year: Not on file  Transportation Needs:   . Lack of Transportation (Medical): Not on file  . Lack of Transportation (Non-Medical): Not on file  Physical Activity:   . Days of Exercise per Week: Not on file  . Minutes of Exercise per Session: Not on file  Stress:   . Feeling of Stress : Not on file  Social Connections:   . Frequency of Communication with Friends and Family: Not on file  . Frequency of Social Gatherings with Friends and Family: Not on file  . Attends Religious Services: Not on file  . Active Member of Clubs or Organizations: Not on file  . Attends Archivist Meetings: Not on file  . Marital Status: Not on file  Intimate Partner Violence:   . Fear of Current or Ex-Partner: Not on file  . Emotionally Abused: Not on file  . Physically Abused: Not on file  . Sexually Abused: Not on file    Outpatient Medications Prior to Visit  Medication Sig Dispense Refill  . acetaminophen (TYLENOL) 500 MG tablet Take 1,000 mg by mouth every 6 (six) hours as needed for headache (pain).    Marland Kitchen aspirin EC 81 MG tablet Take 81 mg by mouth daily. 07/23/2019 ASA on hold per Dr. Randel Pigg.    . benzocaine (ORAJEL) 10 % mucosal gel Use as directed 1 application in the mouth or throat as needed for mouth pain. Reported on 07/22/2015    . Cholecalciferol (VITAMIN D3) 50 MCG (2000 UT) TABS Take by mouth. Take 1 tablet daily Monday-Friday. Take 2 tablets on Saturday.    . esomeprazole (NEXIUM) 40 MG capsule Take by mouth daily.   3  . famotidine (PEPCID) 20 MG tablet 2 (two) times daily as needed.     . hydrochlorothiazide (HYDRODIURIL) 12.5 MG tablet Take 12.5 mg by  mouth daily. Take if BP> 110/70    . Lidocaine-Hydrocortisone Ace 3-0.5 % CREA Apply 1 application topically daily as needed (hemmoriods.).     Marland Kitchen lisinopril (ZESTRIL) 10 MG tablet TAKE 1 TABLET BY MOUTH TWICE DAILY 180 tablet  1  . metoprolol succinate (TOPROL-XL) 50 MG 24 hr tablet Take 1-2 tablets (50-100 mg total) by mouth 2 (two) times daily. Take with or immediately following a meal. 360 tablet 1  . SIMPLY SALINE NA Place 1 each into the nose daily.      No facility-administered medications prior to visit.    Allergies  Allergen Reactions  . Pseudoephedrine Hcl Er Other (See Comments)    TACHYCARDIA   . Singulair [Montelukast Sodium] Other (See Comments)    Mental status changes, irritable  . Cefdinir Hives  . Celebrex [Celecoxib] Other (See Comments)    ABDOMINAL PAIN   . Ciprofloxacin Other (See Comments)    ABDOMINAL PAIN   . Codeine Nausea And Vomiting    NAUSEA VOMITING - can take with zofran    Review of Systems  Constitutional: Negative for fever and malaise/fatigue.  HENT: Negative for congestion.   Eyes: Negative for blurred vision.  Respiratory: Negative for shortness of breath.   Cardiovascular: Negative for chest pain, palpitations and leg swelling.  Gastrointestinal: Positive for abdominal pain. Negative for blood in stool, constipation, diarrhea and nausea.  Genitourinary: Negative for dysuria and frequency.  Musculoskeletal: Negative for falls.  Skin: Negative for rash.  Neurological: Negative for dizziness, loss of consciousness and headaches.  Endo/Heme/Allergies: Negative for environmental allergies.  Psychiatric/Behavioral: Negative for depression. The patient is not nervous/anxious.        Objective:    Physical Exam Vitals and nursing note reviewed.  Constitutional:      General: She is not in acute distress.    Appearance: She is well-developed.  HENT:     Head: Normocephalic and atraumatic.     Nose: Nose normal.  Eyes:     General:         Right eye: No discharge.        Left eye: No discharge.  Cardiovascular:     Rate and Rhythm: Normal rate and regular rhythm.     Heart sounds: No murmur heard.   Pulmonary:     Effort: Pulmonary effort is normal.  Abdominal:     General: Bowel sounds are normal. There is no distension.     Palpations: Abdomen is soft. There is no mass.     Tenderness: There is abdominal tenderness. There is no guarding or rebound.  Musculoskeletal:     Cervical back: Normal range of motion and neck supple.  Skin:    General: Skin is warm and dry.  Neurological:     Mental Status: She is alert and oriented to person, place, and time.     BP 124/63 (BP Location: Left Arm, Patient Position: Sitting, Cuff Size: Large)   Pulse 76   Temp 98.2 F (36.8 C) (Oral)   Resp 13   Ht 5\' 1"  (1.549 m)   Wt 175 lb 6.4 oz (79.6 kg)   SpO2 99%   BMI 33.14 kg/m  Wt Readings from Last 3 Encounters:  12/08/19 175 lb 6.4 oz (79.6 kg)  10/13/19 177 lb (80.3 kg)  09/08/19 176 lb 6.4 oz (80 kg)    Diabetic Foot Exam - Simple   No data filed     Lab Results  Component Value Date   WBC 5.8 12/02/2019   HGB 12.9 12/02/2019   HCT 38.5 12/02/2019   PLT 252 12/02/2019   GLUCOSE 93 12/02/2019   CHOL 216 (H) 12/02/2019   TRIG 195 (H) 12/02/2019   HDL 55 12/02/2019   LDLDIRECT 106.0 02/18/2018  LDLCALC 127 (H) 12/02/2019   ALT 16 12/02/2019   AST 16 12/02/2019   NA 135 12/02/2019   K 4.3 12/02/2019   CL 100 12/02/2019   CREATININE 1.10 (H) 12/02/2019   BUN 19 12/02/2019   CO2 26 12/02/2019   TSH 1.29 12/02/2019   INR 1.19 02/07/2010   HGBA1C 5.5 12/02/2019    Lab Results  Component Value Date   TSH 1.29 12/02/2019   Lab Results  Component Value Date   WBC 5.8 12/02/2019   HGB 12.9 12/02/2019   HCT 38.5 12/02/2019   MCV 90.2 12/02/2019   PLT 252 12/02/2019   Lab Results  Component Value Date   NA 135 12/02/2019   K 4.3 12/02/2019   CO2 26 12/02/2019   GLUCOSE 93 12/02/2019   BUN 19  12/02/2019   CREATININE 1.10 (H) 12/02/2019   BILITOT 0.4 12/02/2019   ALKPHOS 54 10/13/2019   AST 16 12/02/2019   ALT 16 12/02/2019   PROT 7.1 12/02/2019   ALBUMIN 4.5 10/13/2019   CALCIUM 9.6 12/02/2019   ANIONGAP 8 10/13/2019   GFR 59.79 (L) 08/26/2019   Lab Results  Component Value Date   CHOL 216 (H) 12/02/2019   Lab Results  Component Value Date   HDL 55 12/02/2019   Lab Results  Component Value Date   LDLCALC 127 (H) 12/02/2019   Lab Results  Component Value Date   TRIG 195 (H) 12/02/2019   Lab Results  Component Value Date   CHOLHDL 3.9 12/02/2019   Lab Results  Component Value Date   HGBA1C 5.5 12/02/2019       Assessment & Plan:   Problem List Items Addressed This Visit    HTN (hypertension) - Primary (Chronic)    Well controlled, no changes to meds. Encouraged heart healthy diet such as the DASH diet and exercise as tolerated.       Relevant Orders   CBC   Comprehensive metabolic panel   TSH   Vitamin D deficiency    Supplement and monitor      Relevant Orders   VITAMIN D 25 Hydroxy (Vit-D Deficiency, Fractures)   Hyperlipidemia    Encouraged heart healthy diet, increase exercise, avoid trans fats, consider a krill oil cap daily      Relevant Orders   Lipid panel   Hyperglycemia    hgba1c acceptable, minimize simple carbs. Increase exercise as tolerated      Relevant Orders   Hemoglobin A1c   Vitamin B12 deficiency    Supplement and monitor      Relevant Orders   Vitamin B12   Diverticulosis    She has had some llq cramping off and on for past couple of days. Is improving and some better today. If she has increased pain and new symptoms she has Levaquin and Flagyl to take. Maintain good fluids and fiber intake.         I am having Christean Grief. Summerson start on levofloxacin. I am also having her maintain her acetaminophen, benzocaine, Lidocaine-Hydrocortisone Ace, SIMPLY SALINE NA, famotidine, esomeprazole, aspirin EC, Vitamin D3,  hydrochlorothiazide, metoprolol succinate, lisinopril, and metroNIDAZOLE.  Meds ordered this encounter  Medications  . levofloxacin (LEVAQUIN) 250 MG tablet    Sig: Take 1 tablet (250 mg total) by mouth daily.    Dispense:  7 tablet    Refill:  0    Hold unless patient requests  . metroNIDAZOLE (FLAGYL) 500 MG tablet    Sig: Take 1 tablet (500  mg total) by mouth 3 (three) times daily.    Dispense:  21 tablet    Refill:  0    Hold unless patient requests     Penni Homans, MD

## 2019-12-09 NOTE — Assessment & Plan Note (Signed)
Well controlled, no changes to meds. Encouraged heart healthy diet such as the DASH diet and exercise as tolerated.  °

## 2019-12-09 NOTE — Assessment & Plan Note (Signed)
hgba1c acceptable, minimize simple carbs. Increase exercise as tolerated.  

## 2019-12-09 NOTE — Assessment & Plan Note (Signed)
Supplement and monitor 

## 2019-12-09 NOTE — Assessment & Plan Note (Signed)
She has had some llq cramping off and on for past couple of days. Is improving and some better today. If she has increased pain and new symptoms she has Levaquin and Flagyl to take. Maintain good fluids and fiber intake.

## 2019-12-09 NOTE — Assessment & Plan Note (Signed)
Encouraged heart healthy diet, increase exercise, avoid trans fats, consider a krill oil cap daily 

## 2019-12-25 MED FILL — FAMOTIDINE 20 MG TABS: 20 | 30 days supply | Qty: 60 | Fill #4

## 2019-12-25 MED FILL — ESOMEPRAZOLE MAG DR 40 MG C: 40 | 30 days supply | Qty: 30 | Fill #10

## 2020-01-01 ENCOUNTER — Encounter: Payer: Self-pay | Admitting: Family Medicine

## 2020-01-06 ENCOUNTER — Other Ambulatory Visit: Payer: Self-pay | Admitting: Family Medicine

## 2020-01-06 ENCOUNTER — Encounter: Payer: Self-pay | Admitting: Family Medicine

## 2020-01-06 DIAGNOSIS — Z1231 Encounter for screening mammogram for malignant neoplasm of breast: Secondary | ICD-10-CM

## 2020-01-15 ENCOUNTER — Ambulatory Visit
Admission: RE | Admit: 2020-01-15 | Discharge: 2020-01-15 | Disposition: A | Discharge status: Home / Self Care | Payer: Medicare Other | Source: Ambulatory Visit | Attending: Family Medicine | Admitting: Family Medicine

## 2020-01-15 ENCOUNTER — Other Ambulatory Visit: Payer: Self-pay

## 2020-01-15 DIAGNOSIS — Z1231 Encounter for screening mammogram for malignant neoplasm of breast: Secondary | ICD-10-CM

## 2020-01-19 ENCOUNTER — Other Ambulatory Visit (HOSPITAL_BASED_OUTPATIENT_CLINIC_OR_DEPARTMENT_OTHER): Payer: Self-pay | Admitting: Gastroenterology

## 2020-01-19 MED FILL — FAMOTIDINE 20 MG TABS: 20 | 30 days supply | Qty: 60 | Fill #0

## 2020-01-19 MED FILL — METOPROLOL SUCCINATE ER 50: 50 | 90 days supply | Qty: 360 | Fill #1

## 2020-01-23 ENCOUNTER — Other Ambulatory Visit: Payer: Self-pay

## 2020-01-23 ENCOUNTER — Ambulatory Visit: Payer: Medicare Other | Attending: Internal Medicine

## 2020-01-23 DIAGNOSIS — Z23 Encounter for immunization: Secondary | ICD-10-CM

## 2020-01-23 NOTE — Progress Notes (Signed)
° °  Covid-19 Vaccination Clinic  Name:  Carrie Elliott    MRN: [id number] DOB: 1951-02-02  01/23/2020  [name] was observed post Covid-19 immunization for 15 minutes without incident. She was provided with Vaccine Information Sheet and instruction to access the V-Safe system.   [name] was instructed to call 911 with any severe reactions post vaccine:  Difficulty breathing   Swelling of face and throat   A fast heartbeat   A bad rash all over body   Dizziness and weakness

## 2020-01-25 ENCOUNTER — Other Ambulatory Visit: Payer: Self-pay | Admitting: Family Medicine

## 2020-01-25 MED FILL — HYDROCHLOROTHIAZIDE 12.5 MG: 12.5 | 90 days supply | Qty: 90 | Fill #0

## 2020-01-25 MED FILL — ESOMEPRAZOLE MAG DR 40 MG C: 40 | 30 days supply | Qty: 30 | Fill #0

## 2020-02-02 ENCOUNTER — Ambulatory Visit: Payer: Medicare Other

## 2020-02-17 ENCOUNTER — Other Ambulatory Visit: Payer: Self-pay | Admitting: Family Medicine

## 2020-02-17 MED FILL — LISINOPRIL 10 MG TABS: 10 | 90 days supply | Qty: 180 | Fill #0

## 2020-02-22 MED FILL — FAMOTIDINE 20 MG TABS: 20 | 30 days supply | Qty: 60 | Fill #1

## 2020-02-22 MED FILL — ESOMEPRAZOLE MAG DR 40 MG C: 40 | 30 days supply | Qty: 30 | Fill #1

## 2020-03-14 ENCOUNTER — Other Ambulatory Visit: Payer: Medicare Other

## 2020-03-16 NOTE — Addendum Note (Signed)
Addended by: Kelle Darting A on: 03/16/2020 03:50 PM   Modules accepted: Orders

## 2020-03-17 ENCOUNTER — Telehealth: Payer: Medicare Other | Admitting: Family Medicine

## 2020-03-21 MED FILL — FAMOTIDINE 20 MG TABS: 20 | 30 days supply | Qty: 60 | Fill #2

## 2020-03-22 ENCOUNTER — Other Ambulatory Visit (INDEPENDENT_AMBULATORY_CARE_PROVIDER_SITE_OTHER): Payer: Medicare Other

## 2020-03-22 ENCOUNTER — Other Ambulatory Visit: Payer: Self-pay

## 2020-03-22 DIAGNOSIS — E559 Vitamin D deficiency, unspecified: Secondary | ICD-10-CM

## 2020-03-22 DIAGNOSIS — I10 Essential (primary) hypertension: Secondary | ICD-10-CM | POA: Diagnosis not present

## 2020-03-22 DIAGNOSIS — R739 Hyperglycemia, unspecified: Secondary | ICD-10-CM

## 2020-03-22 DIAGNOSIS — E538 Deficiency of other specified B group vitamins: Secondary | ICD-10-CM | POA: Diagnosis not present

## 2020-03-22 DIAGNOSIS — E785 Hyperlipidemia, unspecified: Secondary | ICD-10-CM | POA: Diagnosis not present

## 2020-03-22 LAB — LIPID PANEL
Cholesterol: 210 mg/dL — ABNORMAL HIGH (ref 0–200)
HDL: 57 mg/dL (ref >39.00)
LDL Cholesterol: 119 mg/dL — ABNORMAL HIGH (ref 0–99)
NonHDL: 152.9
Total CHOL/HDL Ratio: 4
Triglycerides: 168 mg/dL — ABNORMAL HIGH (ref 0.0–149.0)
VLDL: 33.6 mg/dL (ref 0.0–40.0)

## 2020-03-22 LAB — CBC
HCT: 39 % (ref 36.0–46.0)
Hemoglobin: 12.9 g/dL (ref 12.0–15.0)
MCHC: 33.1 g/dL (ref 30.0–36.0)
MCV: 88.8 fl (ref 78.0–100.0)
Platelets: 246 10*3/uL (ref 150.0–400.0)
RBC: 4.39 Mil/uL (ref 3.87–5.11)
RDW: 13.6 % (ref 11.5–15.5)
WBC: 5 10*3/uL (ref 4.0–10.5)

## 2020-03-22 LAB — COMPREHENSIVE METABOLIC PANEL
ALT: 21 U/L (ref 0–35)
AST: 17 U/L (ref 0–37)
Albumin: 4.2 g/dL (ref 3.5–5.2)
Alkaline Phosphatase: 53 U/L (ref 39–117)
BUN: 18 mg/dL (ref 6–23)
CO2: 28 mEq/L (ref 19–32)
Calcium: 9.6 mg/dL (ref 8.4–10.5)
Chloride: 100 mEq/L (ref 96–112)
Creatinine, Ser: 0.97 mg/dL (ref 0.40–1.20)
GFR: 59.63 mL/min — ABNORMAL LOW (ref >60.00)
Glucose, Bld: 89 mg/dL (ref 70–99)
Potassium: 4.4 mEq/L (ref 3.5–5.1)
Sodium: 134 mEq/L — ABNORMAL LOW (ref 135–145)
Total Bilirubin: 0.5 mg/dL (ref 0.2–1.2)
Total Protein: 7.2 g/dL (ref 6.0–8.3)

## 2020-03-22 LAB — TSH: TSH: 1.36 u[IU]/mL (ref 0.35–4.50)

## 2020-03-22 LAB — VITAMIN B12: Vitamin B-12: 394 pg/mL (ref 211–911)

## 2020-03-22 LAB — HEMOGLOBIN A1C: Hgb A1c MFr Bld: 5.9 % (ref 4.6–6.5)

## 2020-03-22 LAB — VITAMIN D 25 HYDROXY (VIT D DEFICIENCY, FRACTURES): VITD: 46.89 ng/mL (ref 30.00–100.00)

## 2020-03-22 MED FILL — ESOMEPRAZOLE MAG DR 40 MG C: 40 | 30 days supply | Qty: 30 | Fill #2

## 2020-03-28 ENCOUNTER — Encounter: Payer: Self-pay | Admitting: Family Medicine

## 2020-03-28 ENCOUNTER — Telehealth (INDEPENDENT_AMBULATORY_CARE_PROVIDER_SITE_OTHER): Payer: Medicare Other | Admitting: Family Medicine

## 2020-03-28 ENCOUNTER — Other Ambulatory Visit: Payer: Self-pay

## 2020-03-28 DIAGNOSIS — E785 Hyperlipidemia, unspecified: Secondary | ICD-10-CM

## 2020-03-28 DIAGNOSIS — I10 Essential (primary) hypertension: Secondary | ICD-10-CM

## 2020-03-28 DIAGNOSIS — K449 Diaphragmatic hernia without obstruction or gangrene: Secondary | ICD-10-CM

## 2020-03-28 DIAGNOSIS — D500 Iron deficiency anemia secondary to blood loss (chronic): Secondary | ICD-10-CM | POA: Diagnosis not present

## 2020-03-28 DIAGNOSIS — K219 Gastro-esophageal reflux disease without esophagitis: Secondary | ICD-10-CM

## 2020-03-28 DIAGNOSIS — E559 Vitamin D deficiency, unspecified: Secondary | ICD-10-CM | POA: Diagnosis not present

## 2020-03-28 DIAGNOSIS — R739 Hyperglycemia, unspecified: Secondary | ICD-10-CM

## 2020-03-28 DIAGNOSIS — E538 Deficiency of other specified B group vitamins: Secondary | ICD-10-CM

## 2020-03-28 NOTE — Assessment & Plan Note (Signed)
Avoid offending foods, start probiotics. Do not eat large meals in late evening and consider raising head of bed. Continue current meds 

## 2020-03-28 NOTE — Progress Notes (Signed)
Virtual Visit via Video Note  I connected with Carrie Elliott on 03/28/20 at  3:00 PM EST by a video enabled telemedicine application and verified that I am speaking with the correct person using two identifiers.  Location: Patient: home, patient and provider in visit Provider: office   I discussed the limitations of evaluation and management by telemedicine and the availability of in person appointments. The patient expressed understanding and agreed to proceed. Carrie Elliott, CMA was able to get the patient set up on a video visit.    Subjective:    Patient ID: Carrie Elliott, female    DOB: 23-Dec-1950, 70 y.o.   MRN: [id number]  Chief Complaint  Patient presents with  . Follow-up    3 mo follow up, pt has sent recorded BP readings through MyChart     HPI Patient is in today for follow up on chronic medical concerns. No recent febrile illness or hospitalization. She and her husband are doing well and continue to maintain quarantine fairly strictly. They are trying to stay active and maintain a heart healthy diet. She is frustrated with her weight gain during the pandemic but is trying to minimize her carbohydrate intake to manage this. Denies CP/palp/SOB/HA/congestion/fevers/GI or GU c/o. Taking meds as prescribed  Past Medical History:  Diagnosis Date  . Allergic rhinitis   . Allergy   . Anemia 08/24/2014  . Arthritis    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, shoulders, knees  . Benign paroxysmal positional vertigo 07/12/2013  . Chicken pox   . Cystocele    grade 2  . Endometriosis    with Menometrorhaghia   . Gastric polyps 09/30/2015  . Gastroesophageal reflux disease with hiatal hernia    Cough Sees Dr Laretta Bolster   . GERD (gastroesophageal reflux disease)   . Hemorrhoids   . History of colonic polyps   . History of colonic polyps 09/30/2015   adenoma  . HTN (hypertension) 04/23/2013  . Hypocalcemia 09/30/2015  . Hyponatremia 11/23/2016  . IBS (irritable bowel syndrome)   . Iron deficiency anemia  06/11/2019  . 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 Measles   . Mumps   . Muscle spasm 09/30/2015  . Osteopenia 10/09/2015  . Rectocele    grade 2  . Reflux    cough  . Rosacea   . Sternal fracture    1986  . Sun-damaged skin 09/05/2014  . Vertigo 11/23/2016  . Vitamin D deficiency 04/06/2016  . 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 APPENDECTOMY  1989  . atypical nevus excision    . BREAST BIOPSY Right   . CHOLECYSTECTOMY  1989  . COLONOSCOPY  2011  . COLONOSCOPY WITH PROPOFOL N/A 05/27/2015   Procedure: COLONOSCOPY WITH PROPOFOL;  Surgeon: Clarene Essex, MD;  Location: [facility];  Service: Endoscopy;  Laterality: N/A;  . CYSTOSCOPY W/ URETERAL STENT REMOVAL  1999  . ESOPHAGOGASTRODUODENOSCOPY  2011   with esophageal stent, perforation   . ESOPHAGOGASTRODUODENOSCOPY (EGD) WITH PROPOFOL N/A 05/27/2015   Procedure: ESOPHAGOGASTRODUODENOSCOPY (EGD) WITH PROPOFOL;  Surgeon: Clarene Essex, MD;  Location: [facility];  Service: Endoscopy;  Laterality: N/A;  . HEMORRHOID SURGERY  2005  . LEFT OOPHORECTOMY  [id number]   complicated by ureteral injuery requiring a ureteral stent   . MMK / ANTERIOR VESICOURETHROPEXY / URETHROPEXY  2000  . TONSILLECTOMY AND ADENOIDECTOMY    . TOTAL ABDOMINAL HYSTERECTOMY  2000  . TUBAL LIGATION  1981  . wisdom teeth removal  1964    Family History  Problem Relation Age of Onset  . Colon polyps Father   . Hypertension Father   . Heart failure Father   . Melanoma Father        multiple  . Heart attack Father        x2  . Heart disease Father        Bundle Block  . Meniere's disease Father   . Cancer Father        melanoma  . Osteoporosis Mother   . Transient ischemic attack Mother   . Cancer Mother        colon  . Heart disease Mother        afib  . Hyperlipidemia Mother   . Diabetes Paternal Grandmother   . Stroke Maternal Grandmother   . Cancer Maternal Grandmother        skin, melanoma, sarcoma  . Colon  polyps Maternal Grandmother   . Diverticulitis Maternal Grandmother   . Stroke Maternal Grandfather   . Heart disease Maternal Grandfather   . Obesity Brother     Social History   Socioeconomic History  . Marital status: Married    Spouse name: Not on file  . Number of children: Not on file  . Years of education: Not on file  . Highest education level: Not on file  Occupational History  . Not on file  Tobacco Use  . Smoking status: Never Smoker  . Smokeless tobacco: Never Used  Vaping Use  . Vaping Use: Never used  Substance and Sexual Activity  . Alcohol use: No  . Drug use: No  . 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 Not on file  Social History Narrative  . Not on file   Social Determinants of Health   Financial Resource Strain: Not on file  Food Insecurity: Not on file  Transportation Needs: Not on file  Physical Activity: Not on file  Stress: Not on file  Social Connections: Not on file  Intimate Partner Violence: Not on file    Outpatient Medications Prior to Visit  Medication Sig Dispense Refill  . acetaminophen (TYLENOL) 500 MG tablet Take 1,000 mg by mouth every 6 (six) hours as needed for headache (pain).    . benzocaine (ORAJEL) 10 % mucosal gel Use as directed 1 application in the mouth or throat as needed for mouth pain. Reported on 07/22/2015    . Cholecalciferol (VITAMIN D3) 50 MCG (2000 UT) TABS Take by mouth. Take 1 tablet daily Monday-Friday. Take 2 tablets on Saturday.    . esomeprazole (NEXIUM) 40 MG capsule Take by mouth daily.   3  . famotidine (PEPCID) 20 MG tablet 2 (two) times daily as needed.     . hydrochlorothiazide (MICROZIDE) 12.5 MG capsule Take 1 capsule (12.5 mg total) by mouth daily. Take if > 110/70 90 capsule 2  . Lidocaine-Hydrocortisone Ace 3-0.5 % CREA Apply 1 application topically daily as needed (hemmoriods.).     Marland Kitchen lisinopril (ZESTRIL) 10  MG tablet Take 1 tablet (10 mg total) by mouth 2 (two) times daily. 180 tablet 0  . metoprolol succinate (TOPROL-XL) 50 MG 24 hr tablet Take 1-2 tablets (50-100 mg total) by mouth 2 (two) times daily. Take with or immediately following a meal. 360 tablet 1  . SIMPLY SALINE NA Place 1 each into the nose daily.     Marland Kitchen aspirin  EC 81 MG tablet Take 81 mg by mouth daily. 07/23/2019 ASA on hold per Dr. Rogelia RohrerBlythe. (Patient not taking: Reported on 03/28/2020)    . levofloxacin (LEVAQUIN) 250 MG tablet Take 1 tablet (250 mg total) by mouth daily. (Patient not taking: Reported on 03/28/2020) 7 tablet 0  . metroNIDAZOLE (FLAGYL) 500 MG tablet Take 1 tablet (500 mg total) by mouth 3 (three) times daily. (Patient not taking: Reported on 03/28/2020) 21 tablet 0   No facility-administered medications prior to visit.    Allergies  Allergen Reactions  . Pseudoephedrine Hcl Er Other (See Comments)    TACHYCARDIA   . Singulair [Montelukast Sodium] Other (See Comments)    Mental status changes, irritable  . Cefdinir Hives  . Celebrex [Celecoxib] Other (See Comments)    ABDOMINAL PAIN   . Ciprofloxacin Other (See Comments)    ABDOMINAL PAIN   . Codeine Nausea And Vomiting    NAUSEA VOMITING - can take with zofran    Review of Systems  Constitutional: Negative for fever and malaise/fatigue.  HENT: Negative for congestion.   Eyes: Negative for blurred vision.  Respiratory: Negative for shortness of breath.   Cardiovascular: Negative for chest pain, palpitations and leg swelling.  Gastrointestinal: Negative for abdominal pain, blood in stool and nausea.  Genitourinary: Negative for dysuria and frequency.  Musculoskeletal: Negative for falls.  Skin: Negative for rash.  Neurological: Negative for dizziness, loss of consciousness and headaches.  Endo/Heme/Allergies: Negative for environmental allergies.  Psychiatric/Behavioral: Negative for depression. The patient is not nervous/anxious.        Objective:     Physical Exam Constitutional:      Appearance: Normal appearance. She is not ill-appearing.  HENT:     Head: Normocephalic and atraumatic.     Right Ear: External ear normal.     Left Ear: External ear normal.     Nose: Nose normal.  Eyes:     General:        Right eye: No discharge.        Left eye: No discharge.  Pulmonary:     Effort: Pulmonary effort is normal.  Neurological:     Mental Status: She is alert and oriented to person, place, and time.  Psychiatric:        Behavior: Behavior normal.     There were no vitals taken for this visit. Wt Readings from Last 3 Encounters:  12/08/19 175 lb 6.4 oz (79.6 kg)  10/13/19 177 lb (80.3 kg)  09/08/19 176 lb 6.4 oz (80 kg)    Diabetic Foot Exam - Simple   No data filed    Lab Results  Component Value Date   WBC 5.0 03/22/2020   HGB 12.9 03/22/2020   HCT 39.0 03/22/2020   PLT 246.0 03/22/2020   GLUCOSE 89 03/22/2020   CHOL 210 (H) 03/22/2020   TRIG 168.0 (H) 03/22/2020   HDL 57.00 03/22/2020   LDLDIRECT 106.0 02/18/2018   LDLCALC 119 (H) 03/22/2020   ALT 21 03/22/2020   AST 17 03/22/2020   NA 134 (L) 03/22/2020   K 4.4 03/22/2020   CL 100 03/22/2020   CREATININE 0.97 03/22/2020   BUN 18 03/22/2020   CO2 28 03/22/2020   TSH 1.36 03/22/2020   INR 1.19 02/07/2010   HGBA1C 5.9 03/22/2020    Lab Results  Component Value Date   TSH 1.36 03/22/2020   Lab Results  Component Value Date   WBC 5.0 03/22/2020   HGB 12.9 03/22/2020  HCT 39.0 03/22/2020   MCV 88.8 03/22/2020   PLT 246.0 03/22/2020   Lab Results  Component Value Date   NA 134 (L) 03/22/2020   K 4.4 03/22/2020   CO2 28 03/22/2020   GLUCOSE 89 03/22/2020   BUN 18 03/22/2020   CREATININE 0.97 03/22/2020   BILITOT 0.5 03/22/2020   ALKPHOS 53 03/22/2020   AST 17 03/22/2020   ALT 21 03/22/2020   PROT 7.2 03/22/2020   ALBUMIN 4.2 03/22/2020   CALCIUM 9.6 03/22/2020   ANIONGAP 8 10/13/2019   GFR 59.63 (L) 03/22/2020   Lab Results   Component Value Date   CHOL 210 (H) 03/22/2020   Lab Results  Component Value Date   HDL 57.00 03/22/2020   Lab Results  Component Value Date   LDLCALC 119 (H) 03/22/2020   Lab Results  Component Value Date   TRIG 168.0 (H) 03/22/2020   Lab Results  Component Value Date   CHOLHDL 4 03/22/2020   Lab Results  Component Value Date   HGBA1C 5.9 03/22/2020       Assessment & Plan:   Problem List Items Addressed This Visit    HTN (hypertension) - Primary (Chronic)    Well controlled, no changes to meds. Encouraged heart healthy diet such as the DASH diet and exercise as tolerated.       Relevant Orders   CBC   Comprehensive metabolic panel   TSH   Iron deficiency anemia (Chronic)   Gastroesophageal reflux disease with hiatal hernia    Avoid offending foods, start probiotics. Do not eat large meals in late evening and consider raising head of bed. Continue current meds      Hypocalcemia   Vitamin D deficiency    Supplement and monitor      Relevant Orders   VITAMIN D 25 Hydroxy (Vit-D Deficiency, Fractures)   Hyperlipidemia    Maintain heart healthy diet, increase exercise, avoid trans fats, consider a krill oil cap daily      Relevant Orders   Lipid panel   Hyperglycemia    hgba1c acceptable, minimize simple carbs. Increase exercise as tolerated.       Relevant Orders   Hemoglobin A1c   Vitamin B12 deficiency    Supplement and monitor      Relevant Orders   Vitamin B12      I have discontinued Christean Grief. Wenke's levofloxacin and metroNIDAZOLE. I am also having her maintain her acetaminophen, benzocaine, Lidocaine-Hydrocortisone Ace, SIMPLY SALINE NA, famotidine, esomeprazole, aspirin EC, Vitamin D3, metoprolol succinate, hydrochlorothiazide, and lisinopril.  No orders of the defined types were placed in this encounter.    I discussed the assessment and treatment plan with the patient. The patient was provided an opportunity to ask questions and all  were answered. The patient agreed with the plan and demonstrated an understanding of the instructions.   The patient was advised to call back or seek an in-person evaluation if the symptoms worsen or if the condition fails to improve as anticipated.  I provided 20 minutes of non-face-to-face time during this encounter.   Penni Homans, MD

## 2020-03-28 NOTE — Assessment & Plan Note (Signed)
Maintain heart healthy diet, increase exercise, avoid trans fats, consider a krill oil cap daily 

## 2020-03-28 NOTE — Assessment & Plan Note (Signed)
Well controlled, no changes to meds. Encouraged heart healthy diet such as the DASH diet and exercise as tolerated.  °

## 2020-03-28 NOTE — Assessment & Plan Note (Signed)
Supplement and monitor 

## 2020-03-28 NOTE — Assessment & Plan Note (Signed)
hgba1c acceptable, minimize simple carbs. Increase exercise as tolerated.  

## 2020-04-14 ENCOUNTER — Ambulatory Visit: Payer: Medicare Other | Admitting: Family Medicine

## 2020-05-12 ENCOUNTER — Encounter: Payer: Self-pay | Admitting: Family Medicine

## 2020-05-12 MED ORDER — LISINOPRIL 10 MG PO TABS: 10.0000 mg | ORAL_TABLET | Freq: Two times a day (BID) | ORAL | 1 refills | Status: DC

## 2020-05-26 DIAGNOSIS — D500 Iron deficiency anemia secondary to blood loss (chronic): Secondary | ICD-10-CM | POA: Diagnosis not present

## 2020-05-26 DIAGNOSIS — K317 Polyp of stomach and duodenum: Secondary | ICD-10-CM | POA: Diagnosis not present

## 2020-05-26 DIAGNOSIS — R194 Change in bowel habit: Secondary | ICD-10-CM | POA: Diagnosis not present

## 2020-07-01 ENCOUNTER — Other Ambulatory Visit (HOSPITAL_BASED_OUTPATIENT_CLINIC_OR_DEPARTMENT_OTHER): Payer: Self-pay

## 2020-07-01 MED FILL — Hydrochlorothiazide Cap 12.5 MG: ORAL | 90 days supply | Qty: 90 | Fill #0 | Status: CN

## 2020-07-01 MED FILL — Esomeprazole Magnesium Cap Delayed Release 40 MG (Base Eq): ORAL | 30 days supply | Qty: 30 | Fill #0 | Status: CN

## 2020-07-05 ENCOUNTER — Encounter: Payer: Self-pay | Admitting: Family Medicine

## 2020-07-05 ENCOUNTER — Other Ambulatory Visit: Payer: Medicare Other

## 2020-07-07 ENCOUNTER — Other Ambulatory Visit: Payer: Self-pay | Admitting: Family Medicine

## 2020-07-07 ENCOUNTER — Encounter: Payer: Self-pay | Admitting: Family Medicine

## 2020-07-07 MED ORDER — FEXOFENADINE HCL 180 MG PO TABS: 180.0000 mg | ORAL_TABLET | Freq: Every day | ORAL | 5 refills | Status: DC | PRN

## 2020-07-07 MED ORDER — FLUTICASONE PROPIONATE 50 MCG/ACT NA SUSP: 2.0000 | Freq: Every day | NASAL | 6 refills | Status: DC

## 2020-07-12 ENCOUNTER — Telehealth: Payer: Medicare Other | Admitting: Family Medicine

## 2020-07-22 ENCOUNTER — Other Ambulatory Visit: Payer: Self-pay | Admitting: Gastroenterology

## 2020-07-22 ENCOUNTER — Other Ambulatory Visit (HOSPITAL_BASED_OUTPATIENT_CLINIC_OR_DEPARTMENT_OTHER): Payer: Self-pay

## 2020-07-22 ENCOUNTER — Ambulatory Visit: Payer: Medicare Other | Attending: Internal Medicine

## 2020-07-22 DIAGNOSIS — Z23 Encounter for immunization: Secondary | ICD-10-CM | POA: Diagnosis not present

## 2020-07-22 MED ORDER — PFIZER-BIONT COVID-19 VAC-TRIS 30 MCG/0.3ML IM SUSP
INTRAMUSCULAR | 0 refills | Status: DC
  Filled 2020-07-22: qty 0.3, 1d supply, fill #0

## 2020-07-22 NOTE — Progress Notes (Signed)
   Covid-19 Vaccination Clinic  Name:  Carrie Elliott    MRN: [id number] DOB: 25-May-1950  07/22/2020  [name] was observed post Covid-19 immunization for 15 minutes without incident. She was provided with Vaccine Information Sheet and instruction to access the V-Safe system.   [name] was instructed to call 911 with any severe reactions post vaccine: Marland Kitchen Difficulty breathing  . Swelling of face and throat  . A fast heartbeat  . A bad rash all over body  . Dizziness and weakness   Immunizations Administered    Name Date Dose VIS Date Route   PFIZER Comrnaty(Gray TOP) Covid-19 Vaccine 07/22/2020  2:09 PM 0.3 mL 03/03/2020 Intramuscular   Manufacturer: [place]   Lot: [id number]   NDC: [phone]

## 2020-07-25 ENCOUNTER — Other Ambulatory Visit (HOSPITAL_BASED_OUTPATIENT_CLINIC_OR_DEPARTMENT_OTHER): Payer: Self-pay

## 2020-07-26 ENCOUNTER — Other Ambulatory Visit (HOSPITAL_BASED_OUTPATIENT_CLINIC_OR_DEPARTMENT_OTHER): Payer: Self-pay

## 2020-07-26 ENCOUNTER — Other Ambulatory Visit: Payer: Self-pay | Admitting: Gastroenterology

## 2020-07-26 MED ORDER — ESOMEPRAZOLE MAGNESIUM 40 MG PO CPDR
DELAYED_RELEASE_CAPSULE | ORAL | 3 refills | Status: DC
  Filled 2020-07-26 – 2020-07-27 (×3): qty 90, 90d supply, fill #0
  Filled 2020-10-19: qty 90, 90d supply, fill #1
  Filled 2021-01-12: qty 90, 90d supply, fill #2

## 2020-07-27 ENCOUNTER — Other Ambulatory Visit (HOSPITAL_BASED_OUTPATIENT_CLINIC_OR_DEPARTMENT_OTHER): Payer: Self-pay

## 2020-07-27 MED FILL — Esomeprazole Magnesium Cap Delayed Release 40 MG (Base Eq): ORAL | 30 days supply | Qty: 30 | Fill #0 | Status: CN

## 2020-09-04 ENCOUNTER — Encounter: Payer: Self-pay | Admitting: Family Medicine

## 2020-09-14 ENCOUNTER — Ambulatory Visit: Payer: Medicare Other | Admitting: Pulmonary Disease

## 2020-10-03 ENCOUNTER — Other Ambulatory Visit (INDEPENDENT_AMBULATORY_CARE_PROVIDER_SITE_OTHER): Payer: Medicare Other

## 2020-10-03 ENCOUNTER — Other Ambulatory Visit: Payer: Self-pay

## 2020-10-03 DIAGNOSIS — R739 Hyperglycemia, unspecified: Secondary | ICD-10-CM

## 2020-10-03 DIAGNOSIS — E785 Hyperlipidemia, unspecified: Secondary | ICD-10-CM

## 2020-10-03 DIAGNOSIS — E559 Vitamin D deficiency, unspecified: Secondary | ICD-10-CM

## 2020-10-03 DIAGNOSIS — I10 Essential (primary) hypertension: Secondary | ICD-10-CM | POA: Diagnosis not present

## 2020-10-03 DIAGNOSIS — E538 Deficiency of other specified B group vitamins: Secondary | ICD-10-CM

## 2020-10-03 LAB — CBC
HCT: 38.8 % (ref 36.0–46.0)
Hemoglobin: 13 g/dL (ref 12.0–15.0)
MCHC: 33.6 g/dL (ref 30.0–36.0)
MCV: 90.1 fl (ref 78.0–100.0)
Platelets: 249 10*3/uL (ref 150.0–400.0)
RBC: 4.31 Mil/uL (ref 3.87–5.11)
RDW: 13.3 % (ref 11.5–15.5)
WBC: 5.6 10*3/uL (ref 4.0–10.5)

## 2020-10-03 LAB — COMPREHENSIVE METABOLIC PANEL
ALT: 15 U/L (ref 0–35)
AST: 15 U/L (ref 0–37)
Albumin: 4.4 g/dL (ref 3.5–5.2)
Alkaline Phosphatase: 59 U/L (ref 39–117)
BUN: 19 mg/dL (ref 6–23)
CO2: 29 mEq/L (ref 19–32)
Calcium: 9.8 mg/dL (ref 8.4–10.5)
Chloride: 99 mEq/L (ref 96–112)
Creatinine, Ser: 0.85 mg/dL (ref 0.40–1.20)
GFR: 69.61 mL/min (ref >60.00)
Glucose, Bld: 90 mg/dL (ref 70–99)
Potassium: 4.6 mEq/L (ref 3.5–5.1)
Sodium: 136 mEq/L (ref 135–145)
Total Bilirubin: 0.4 mg/dL (ref 0.2–1.2)
Total Protein: 7.4 g/dL (ref 6.0–8.3)

## 2020-10-03 LAB — LIPID PANEL
Cholesterol: 229 mg/dL — ABNORMAL HIGH (ref 0–200)
HDL: 57.1 mg/dL (ref >39.00)
LDL Cholesterol: 142 mg/dL — ABNORMAL HIGH (ref 0–99)
NonHDL: 172.22
Total CHOL/HDL Ratio: 4
Triglycerides: 153 mg/dL — ABNORMAL HIGH (ref 0.0–149.0)
VLDL: 30.6 mg/dL (ref 0.0–40.0)

## 2020-10-03 LAB — HEMOGLOBIN A1C: Hgb A1c MFr Bld: 5.8 % (ref 4.6–6.5)

## 2020-10-03 LAB — TSH: TSH: 1.3 u[IU]/mL (ref 0.35–5.50)

## 2020-10-03 LAB — VITAMIN B12: Vitamin B-12: 335 pg/mL (ref 211–911)

## 2020-10-03 LAB — VITAMIN D 25 HYDROXY (VIT D DEFICIENCY, FRACTURES): VITD: 43.81 ng/mL (ref 30.00–100.00)

## 2020-10-13 ENCOUNTER — Ambulatory Visit (INDEPENDENT_AMBULATORY_CARE_PROVIDER_SITE_OTHER): Payer: Medicare Other | Admitting: Family Medicine

## 2020-10-13 ENCOUNTER — Other Ambulatory Visit: Payer: Self-pay

## 2020-10-13 ENCOUNTER — Encounter: Payer: Self-pay | Admitting: Family Medicine

## 2020-10-13 VITALS — BP 100/60 | HR 68 | Temp 98.6°F | Resp 12 | Ht 61.0 in | Wt 163.6 lb

## 2020-10-13 DIAGNOSIS — H029 Unspecified disorder of eyelid: Secondary | ICD-10-CM | POA: Diagnosis not present

## 2020-10-13 DIAGNOSIS — G4700 Insomnia, unspecified: Secondary | ICD-10-CM

## 2020-10-13 DIAGNOSIS — E559 Vitamin D deficiency, unspecified: Secondary | ICD-10-CM

## 2020-10-13 DIAGNOSIS — M1990 Unspecified osteoarthritis, unspecified site: Secondary | ICD-10-CM

## 2020-10-13 DIAGNOSIS — N8110 Cystocele, unspecified: Secondary | ICD-10-CM | POA: Diagnosis not present

## 2020-10-13 DIAGNOSIS — E538 Deficiency of other specified B group vitamins: Secondary | ICD-10-CM

## 2020-10-13 DIAGNOSIS — R110 Nausea: Secondary | ICD-10-CM

## 2020-10-13 DIAGNOSIS — K219 Gastro-esophageal reflux disease without esophagitis: Secondary | ICD-10-CM | POA: Diagnosis not present

## 2020-10-13 DIAGNOSIS — K449 Diaphragmatic hernia without obstruction or gangrene: Secondary | ICD-10-CM

## 2020-10-13 DIAGNOSIS — I10 Essential (primary) hypertension: Secondary | ICD-10-CM

## 2020-10-13 MED ORDER — HYDROCHLOROTHIAZIDE 12.5 MG PO CAPS: ORAL_CAPSULE | ORAL | 1 refills | Status: DC

## 2020-10-13 MED ORDER — METOPROLOL SUCCINATE ER 50 MG PO TB24: 50.0000 mg | ORAL_TABLET | Freq: Two times a day (BID) | ORAL | 1 refills | Status: DC

## 2020-10-13 MED ORDER — LISINOPRIL 10 MG PO TABS: 10.0000 mg | ORAL_TABLET | Freq: Two times a day (BID) | ORAL | 1 refills | Status: DC

## 2020-10-13 MED ORDER — ONDANSETRON HCL 8 MG PO TABS: 8.0000 mg | ORAL_TABLET | Freq: Three times a day (TID) | ORAL | 1 refills | Status: DC | PRN

## 2020-10-13 MED ORDER — MECLIZINE HCL 25 MG PO TABS: 25.0000 mg | ORAL_TABLET | Freq: Three times a day (TID) | ORAL | 2 refills | Status: DC | PRN

## 2020-10-13 NOTE — Patient Instructions (Addendum)
Carrie Elliott for GYN Consider Amitriptyline/Elavil for sleep if you continue to struggle If toes hurt consider decide to do uric acid blood work   Joint Pain Joint pain may be caused by many things. Joint pain is likely to go away when you follow instructions from your health care provider for relieving pain at home. However, joint pain can also be caused by conditions that require more treatment. Common causes of joint pain include: Bruising in the area of the joint. Injury caused by repeating certain movements too many times. Age-related joint wear and tear. Buildup of uric acid crystals in the joint (gout). Inflammation of the joint. Other forms of arthritis. Infections of the joint or of the bone. Your health care provider may recommend that you take pain medicine or wear a supportive device like an elastic bandage, sling, or splint. If your joint pain continues, you may need lab or imaging tests to diagnose the cause of yourjoint pain. Follow these instructions at home: Managing pain, stiffness, and swelling     If directed, put ice on the painful area. To do this: If you have a removable elastic bandage, sling, or splint, take it off as told by your doctor. Put ice in a plastic bag. Place a towel between your skin and the bag. Leave the ice on for 20 minutes, 2-3 times a day. Remove the ice if your skin turns bright red. This is very important. If you cannot feel pain, heat, or cold, you have a greater risk of damage to the area. Move your fingers and toes often to reduce stiffness and swelling. Raise the injured area above the level of your heart while you are sitting or lying down. If directed, apply heat to the painful area as often as told by your health care provider. Use the heat source that your health care provider recommends, such as a moist heat pack or a heating pad. Place a towel between your skin and the heat source. Leave the heat on for 20-30 minutes. Remove the  heat if your skin turns bright red. This is especially important if you are unable to feel pain, heat, or cold. You have a greater risk of getting burned.  Activity Rest as told by your health care provider. Do not do anything that causes or worsens pain. Begin exercising or stretching the affected area as told by your health care provider. Return to your normal activities as told by your health care provider. Ask your health care provider what activities are safe for you. If you have an elastic bandage, sling, or splint: Wear the bandage, sling, or splint as told by your health care provider. Remove it only as told by your health care provider. Loosen it if your fingers or toes below the joint tingle, become numb, or turn cold and blue. Keep it clean. Ask your health care provider if you should remove it before bathing. If the bandage, sling, or splint is not waterproof: Do not let it get wet. Cover it with a watertight covering when you take a bath or shower. General instructions Treatment may include medicines for pain and inflammation that are taken by mouth or applied to the skin. Take over-the-counter and prescription medicines only as told by your health care provider. Do not use any products that contain nicotine or tobacco, such as cigarettes, e-cigarettes, and chewing tobacco. If you need help quitting, ask your health care provider. Keep all follow-up visits. This is important. Contact a health care provider  if: You have pain that gets worse and does not get better with medicine. Your joint pain does not improve within 3 days. You have increased bruising or swelling. You have a fever. You lose 10 lb (4.5 kg) or more without trying. Get help right away if: You cannot move the joint. Your fingers or toes tingle, become numb, or turn cold and blue. You have a fever along with a joint that is red, warm, and swollen. Summary Joint pain may be caused by many things. Your health  care provider may recommend that you take pain medicine or wear a supportive device such as an elastic bandage, sling, or splint. If your joint pain continues, you may need tests to diagnose the cause of your joint pain. Take over-the-counter and prescription medicines only as told by your health care provider. This information is not intended to replace advice given to you by your health care provider. Make sure you discuss any questions you have with your healthcare provider. Document Revised: 06/24/2019 Document Reviewed: 06/24/2019 Elsevier Patient Education  2022 [employer].

## 2020-10-13 NOTE — Progress Notes (Addendum)
Patient ID: Carrie Elliott, female    DOB: 10-18-1950  Age: 70 y.o. MRN: [id number]    Subjective:  Subjective  HPI Carrie Elliott presents for office visit today for follow up on htn and joint pain. She reports that she has been doing more cleaning and working around the house, but states that she has not been taking allegra as instructed. Denies CP/palp/SOB/HA/congestion/fevers/GI or GU c/o. Taking meds as prescribed. She reports that right side of her neck has been swollen and painful to touch. She states that her toes started cramping and felt hot and swollen, since they got new recliner bed. Endorses drinking enough water. She has trouble falling asleep and going back to sleep. Endorses using her CPAP as instructed and still struggles with vertigo. Expresses interest in going to GYN for cyst issue and wants recommendation.   Review of Systems  Constitutional:  Negative for chills, fatigue and fever.  HENT:  Negative for congestion, rhinorrhea, sinus pressure, sinus pain and sore throat.   Eyes:  Negative for pain.  Respiratory:  Negative for cough and shortness of breath.   Cardiovascular:  Negative for chest pain, palpitations and leg swelling.  Gastrointestinal:  Negative for abdominal pain, blood in stool, diarrhea, nausea and vomiting.  Genitourinary:  Positive for difficulty urinating. Negative for decreased urine volume, flank pain, frequency, vaginal bleeding and vaginal discharge.  Musculoskeletal:  Positive for arthralgias. Negative for back pain.  Neurological:  Positive for dizziness. Negative for headaches.  Psychiatric/Behavioral:  Positive for sleep disturbance.    History Past Medical History:  Diagnosis Date   Allergic rhinitis    Allergy    Anemia 08/24/2014   Arthritis    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, shoulders, knees   Benign paroxysmal positional vertigo 07/12/2013   Chicken pox    Cystocele    grade 2   Endometriosis    with Menometrorhaghia    Gastric polyps 09/30/2015    Gastroesophageal reflux disease with hiatal hernia    Cough Sees Dr Laretta Bolster    GERD (gastroesophageal reflux disease)    Hemorrhoids    History of colonic polyps    History of colonic polyps 09/30/2015   adenoma   HTN (hypertension) 04/23/2013   Hypocalcemia 09/30/2015   Hyponatremia 11/23/2016   IBS (irritable bowel syndrome)    Iron deficiency anemia 06/11/2019   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   Measles    Mumps    Muscle spasm 09/30/2015   Osteopenia 10/09/2015   Rectocele    grade 2   Reflux    cough   Rosacea    Sternal fracture    1986   Sun-damaged skin 09/05/2014   Vertigo 11/23/2016   Vitamin D deficiency 04/06/2016   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   Her family history includes Cancer in her father, maternal grandmother, and mother; Colon polyps in her father and maternal grandmother; Diabetes in her paternal grandmother; Diverticulitis in her maternal grandmother; Heart attack in her father; Heart disease in her father, maternal grandfather, and mother; Heart failure in her father; Hyperlipidemia in her mother; Hypertension in her father;  Melanoma in her father; Meniere's disease in her father; Obesity in her brother; Osteoporosis in her mother; Stroke in her maternal grandfather and maternal grandmother; Transient ischemic attack in her mother.She reports that she has never smoked. She has never used smokeless tobacco. She  reports that she does not drink alcohol and does not use drugs.  Current Outpatient Medications on File Prior to Visit  Medication Sig Dispense Refill   benzocaine (ORAJEL) 10 % mucosal gel Use as directed 1 application in the mouth or throat as needed for mouth pain. Reported on 07/22/2015     Cholecalciferol (VITAMIN D3) 50 MCG (2000 UT) TABS Take by mouth. Take 1 tablet daily Monday-Friday. Take 2 tablets on Saturday.     COVID-19 mRNA Vac-TriS, Pfizer, (PFIZER-BIONT COVID-19 VAC-TRIS) SUSP injection Inject into the muscle. 0.3 mL 0   esomeprazole (NEXIUM) 40 MG capsule TAKE 1 CAPSULE BY MOUTH ONCE A DAY 90 capsule 3   famotidine (PEPCID) 20 MG tablet TAKE 1 TABLET BY MOUTH AT BEDTIME AS NEEDED TWICE A DAY 60 tablet 3   fexofenadine (ALLEGRA) 180 MG tablet Take 1 tablet (180 mg total) by mouth daily as needed for allergies or rhinitis. 30 tablet 5   fluticasone (FLONASE) 50 MCG/ACT nasal spray Place 2 sprays into both nostrils daily. 16 g 6   Lidocaine-Hydrocortisone Ace 3-0.5 % CREA Apply 1 application topically daily as needed (hemmoriods.).      SIMPLY SALINE NA Place 1 each into the nose daily.      No current facility-administered medications on file prior to visit.     Objective:  Objective  Physical Exam Constitutional:      General: She is not in acute distress.    Appearance: Normal appearance. She is not ill-appearing or toxic-appearing.  HENT:     Head: Normocephalic and atraumatic.     Right Ear: Tympanic membrane, ear canal and external ear normal.     Left Ear: Tympanic membrane, ear canal and external ear normal.     Nose: No congestion or rhinorrhea.  Eyes:     Extraocular Movements: Extraocular movements intact.     Pupils: Pupils are equal, round, and reactive to light.  Cardiovascular:     Rate and Rhythm: Normal rate and regular rhythm.     Pulses: Normal pulses.     Heart sounds: Normal heart sounds. No murmur heard. Pulmonary:     Effort: Pulmonary effort  is normal. No respiratory distress.     Breath sounds: Normal breath sounds. No wheezing, rhonchi or rales.  Abdominal:     General: Bowel sounds are normal.     Palpations: Abdomen is soft. There is no mass.     Tenderness: no abdominal tenderness There is no guarding.     Hernia: No hernia is present.  Musculoskeletal:        General: Normal range of motion.     Cervical back: Normal range of motion and neck supple.  Skin:    General: Skin is warm and dry.  Neurological:     Mental Status: She is alert and oriented to person, place, and time.  Psychiatric:        Behavior: Behavior normal.   BP 100/60 (BP Location: Left Arm, Cuff Size: Large)   Pulse 68   Temp 98.6 F (37 C) (Oral)   Resp 12   Ht 5\' 1"  (1.549 m)   Wt 163 lb 9.6 oz (74.2 kg)   SpO2 97%   BMI 30.91 kg/m  Wt Readings from Last 3 Encounters:  10/14/20 163 lb (73.9 kg)  10/13/20 163 lb 9.6 oz (74.2 kg)  12/08/19 175 lb 6.4 oz (79.6 kg)     Lab Results  Component Value Date   WBC 5.6 10/03/2020   HGB 13.0 10/03/2020   HCT 38.8 10/03/2020   PLT 249.0 10/03/2020   GLUCOSE 90 10/03/2020   CHOL 229 (H) 10/03/2020   TRIG 153.0 (H) 10/03/2020   HDL 57.10 10/03/2020   LDLDIRECT 106.0 02/18/2018   LDLCALC 142 (H) 10/03/2020   ALT 15 10/03/2020   AST 15 10/03/2020   NA 136 10/03/2020   K 4.6 10/03/2020   CL 99 10/03/2020   CREATININE 0.85 10/03/2020   BUN 19 10/03/2020   CO2 29 10/03/2020   TSH 1.30 10/03/2020   INR 1.19 02/07/2010   HGBA1C 5.8 10/03/2020    MM 3D SCREEN BREAST BILATERAL  Result Date: 01/19/2020 CLINICAL DATA:  Screening. EXAM: DIGITAL SCREENING BILATERAL MAMMOGRAM WITH TOMO AND CAD COMPARISON:  Previous exam(s). ACR Breast Density Category b: There are scattered areas of fibroglandular density. FINDINGS: There are no findings suspicious for malignancy. Images were processed with CAD. IMPRESSION: No mammographic evidence of malignancy. A result letter of this screening mammogram will  be mailed directly to the patient. RECOMMENDATION: Screening mammogram in one year. (Code:SM-B-01Y) BI-RADS CATEGORY  1: Negative. Electronically Signed   By: Franki Cabot M.D.   On: 01/19/2020 13:06     Assessment & Plan:  Plan    Meds ordered this encounter  Medications   metoprolol succinate (TOPROL-XL) 50 MG 24 hr tablet    Sig: Take 1 tablet (50 mg total) by mouth in the morning and at bedtime.    Dispense:  180 tablet    Refill:  1   lisinopril (ZESTRIL) 10 MG tablet    Sig: Take 1 tablet (10 mg total) by mouth 2 (two) times daily.    Dispense:  180 tablet    Refill:  1   hydrochlorothiazide (MICROZIDE) 12.5 MG capsule    Sig: TAKE 1 CAPSULE (12.5 MG TOTAL) BY MOUTH DAILY. TAKE IF > 110/70    Dispense:  90 capsule    Refill:  1   ondansetron (ZOFRAN) 8 MG tablet    Sig: Take 1 tablet (8 mg total) by mouth every 8 (eight) hours as needed for nausea or vomiting.    Dispense:  90 tablet    Refill:  1   meclizine (ANTIVERT) 25 MG tablet    Sig: Take 1 tablet (25 mg total) by mouth 3 (three) times daily as needed for dizziness.    Dispense:  30 tablet    Refill:  2    Problem List Items Addressed This Visit     HTN (hypertension) (Chronic)    Well controlled, no changes to meds. Encouraged heart healthy diet such as the DASH diet and exercise as tolerated.        Relevant Medications   metoprolol succinate (TOPROL-XL) 50 MG 24 hr tablet   lisinopril (ZESTRIL) 10 MG tablet   hydrochlorothiazide (MICROZIDE) 12.5 MG capsule   Arthritis    worsening and diffuse but especially noted in her great toes. Consider checking a uric acid with next blood draw. Hydrate well and report concerns       Gastroesophageal reflux disease with hiatal hernia    Following with gastroenterology dr Tami Lin, using Nexium and Omeprazole        Relevant Medications   ondansetron (ZOFRAN) 8  MG tablet   meclizine (ANTIVERT) 25 MG tablet   Vitamin D deficiency    Supplement and monitor        Vitamin B12 deficiency    Supplement and monitor       Cystocele, unspecified (CODE) - Primary    She believes she has a cystocele or even a rectocele and is noting worsening incontinence sos he is referred to OB/GYN for further evaluation and consideration of treatment options.       Relevant Orders   Ambulatory referral to Obstetrics / Gynecology   Nausea    Struggles with occasional nausea and responds to ondansetron. She is allowed a refill today       Insomnia    Encouraged good sleep hygiene such as dark, quiet room. No blue/green glowing lights such as computer screens in bedroom. No alcohol or stimulants in evening. Cut down on caffeine as able. Regular exercise is helpful but not just prior to bed time. Consider Elavil 10-20 mg qhs prn if persists       Eyelid abnormality    Small punctate lesion left upper eyelid. She is referred back to her opthamologist for consideration of excision        Follow-up: Return AWV with RN and f/u with md in 3 mn.  I, Suezanne Jacquet, acting as a scribe for Penni Homans, MD, have documented all relevent documentation on behalf of Penni Homans, MD, as directed by Penni Homans, MD while in the presence of Penni Homans, MD.  I, Mosie Lukes, MD personally performed the services described in this documentation. All medical record entries made by the scribe were at my direction and in my presence. I have reviewed the chart and agree that the record reflects my personal performance and is accurate and complete

## 2020-10-14 ENCOUNTER — Ambulatory Visit: Payer: Self-pay

## 2020-10-14 ENCOUNTER — Encounter (HOSPITAL_BASED_OUTPATIENT_CLINIC_OR_DEPARTMENT_OTHER): Payer: Self-pay | Admitting: *Deleted

## 2020-10-14 ENCOUNTER — Emergency Department (HOSPITAL_BASED_OUTPATIENT_CLINIC_OR_DEPARTMENT_OTHER): Payer: PRIVATE HEALTH INSURANCE

## 2020-10-14 ENCOUNTER — Other Ambulatory Visit: Payer: Self-pay | Admitting: Family Medicine

## 2020-10-14 ENCOUNTER — Encounter: Payer: Self-pay | Admitting: Family

## 2020-10-14 ENCOUNTER — Emergency Department (HOSPITAL_BASED_OUTPATIENT_CLINIC_OR_DEPARTMENT_OTHER)
Admission: EM | Admit: 2020-10-14 | Discharge: 2020-10-14 | Disposition: A | Discharge status: Home / Self Care | Payer: PRIVATE HEALTH INSURANCE | Attending: Emergency Medicine | Admitting: Emergency Medicine

## 2020-10-14 ENCOUNTER — Other Ambulatory Visit: Payer: Self-pay

## 2020-10-14 DIAGNOSIS — R220 Localized swelling, mass and lump, head: Secondary | ICD-10-CM | POA: Insufficient documentation

## 2020-10-14 DIAGNOSIS — S62111A Displaced fracture of triquetrum [cuneiform] bone, right wrist, initial encounter for closed fracture: Secondary | ICD-10-CM | POA: Insufficient documentation

## 2020-10-14 DIAGNOSIS — W01198A Fall on same level from slipping, tripping and stumbling with subsequent striking against other object, initial encounter: Secondary | ICD-10-CM | POA: Insufficient documentation

## 2020-10-14 DIAGNOSIS — Z79899 Other long term (current) drug therapy: Secondary | ICD-10-CM | POA: Diagnosis not present

## 2020-10-14 DIAGNOSIS — M25531 Pain in right wrist: Secondary | ICD-10-CM

## 2020-10-14 DIAGNOSIS — S62316A Displaced fracture of base of fifth metacarpal bone, right hand, initial encounter for closed fracture: Secondary | ICD-10-CM | POA: Diagnosis not present

## 2020-10-14 DIAGNOSIS — D110 Benign neoplasm of parotid gland: Secondary | ICD-10-CM | POA: Diagnosis not present

## 2020-10-14 DIAGNOSIS — M50323 Other cervical disc degeneration at C6-C7 level: Secondary | ICD-10-CM | POA: Diagnosis not present

## 2020-10-14 DIAGNOSIS — M79641 Pain in right hand: Secondary | ICD-10-CM | POA: Insufficient documentation

## 2020-10-14 DIAGNOSIS — S0993XA Unspecified injury of face, initial encounter: Secondary | ICD-10-CM | POA: Diagnosis not present

## 2020-10-14 DIAGNOSIS — Z043 Encounter for examination and observation following other accident: Secondary | ICD-10-CM | POA: Diagnosis not present

## 2020-10-14 DIAGNOSIS — K118 Other diseases of salivary glands: Secondary | ICD-10-CM

## 2020-10-14 DIAGNOSIS — Y990 Civilian activity done for income or pay: Secondary | ICD-10-CM | POA: Diagnosis not present

## 2020-10-14 DIAGNOSIS — S0011XA Contusion of right eyelid and periocular area, initial encounter: Secondary | ICD-10-CM | POA: Diagnosis not present

## 2020-10-14 DIAGNOSIS — M50322 Other cervical disc degeneration at C5-C6 level: Secondary | ICD-10-CM | POA: Diagnosis not present

## 2020-10-14 DIAGNOSIS — M25561 Pain in right knee: Secondary | ICD-10-CM | POA: Diagnosis not present

## 2020-10-14 DIAGNOSIS — I10 Essential (primary) hypertension: Secondary | ICD-10-CM | POA: Diagnosis not present

## 2020-10-14 DIAGNOSIS — M7989 Other specified soft tissue disorders: Secondary | ICD-10-CM | POA: Diagnosis not present

## 2020-10-14 DIAGNOSIS — S6991XA Unspecified injury of right wrist, hand and finger(s), initial encounter: Secondary | ICD-10-CM | POA: Diagnosis present

## 2020-10-14 DIAGNOSIS — S80211A Abrasion, right knee, initial encounter: Secondary | ICD-10-CM | POA: Diagnosis not present

## 2020-10-14 DIAGNOSIS — W19XXXA Unspecified fall, initial encounter: Secondary | ICD-10-CM

## 2020-10-14 DIAGNOSIS — S62114A Nondisplaced fracture of triquetrum [cuneiform] bone, right wrist, initial encounter for closed fracture: Secondary | ICD-10-CM

## 2020-10-14 MED ORDER — HYDROCODONE-ACETAMINOPHEN 5-325 MG PO TABS: 1.0000 | ORAL_TABLET | Freq: Four times a day (QID) | ORAL | 0 refills | Status: DC | PRN

## 2020-10-14 NOTE — ED Triage Notes (Signed)
She tripped over a paver and fell. She hit the right side of her face. Her glasses broke. Abrasions and swelling noted to her face. Pain to her right hand and both of her knees.

## 2020-10-14 NOTE — ED Provider Notes (Signed)
[facility] Provider Note   CSN: [id number] Arrival date & time: 10/14/20  1436     History Chief Complaint  Patient presents with   Carrie Elliott is a 70 y.o. female.  HPI Patient is a 70 year old female with a medical history as noted below.  She presents to the emergency department due to a fall that occurred prior to arrival.  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  She states that she landed on her right hand and wrist and also struck her right knee and right side of the face.  Denies any anticoagulation or LOC.  No chest pain, shortness of breath, abdominal pain, back pain.  No numbness or weakness.  She is right-hand dominant.    Past Medical History:  Diagnosis Date   Allergic rhinitis    Allergy    Anemia 08/24/2014   Arthritis    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, shoulders, knees   Benign paroxysmal positional vertigo 07/12/2013   Chicken pox    Cystocele    grade 2   Endometriosis    with Menometrorhaghia    Gastric polyps 09/30/2015   Gastroesophageal reflux disease with hiatal hernia    Cough Sees Dr Laretta Bolster    GERD (gastroesophageal reflux disease)    Hemorrhoids    History of colonic polyps    History of colonic polyps 09/30/2015   adenoma   HTN (hypertension) 04/23/2013   Hypocalcemia 09/30/2015   Hyponatremia 11/23/2016   IBS (irritable bowel syndrome)    Iron deficiency anemia 06/11/2019   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   Measles    Mumps    Muscle spasm 09/30/2015   Osteopenia 10/09/2015   Rectocele    grade 2   Reflux    cough   Rosacea    Sternal fracture    1986   Sun-damaged skin 09/05/2014   Vertigo 11/23/2016   Vitamin D deficiency 04/06/2016   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    Patient Active Problem List   Diagnosis Date Noted   Diverticulosis 12/09/2019   Iron deficiency anemia  06/11/2019   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   Nasal sore 01/04/2019   Trigger finger, right 10/12/2018   Sleep apnea 09/04/2018   UTI (urinary tract infection) 07/20/2018   Right lower quadrant abdominal pain 05/23/2018   Vitamin B12 deficiency 02/18/2018   Hyperglycemia 09/15/2017   Bruit of right carotid artery 05/26/2017   Hyponatremia 11/23/2016   Vertigo 11/23/2016   Hyperlipidemia 11/23/2016   Vitamin D deficiency 04/06/2016   Candidal dermatitis 04/06/2016   Osteopenia 10/09/2015   Hypocalcemia 09/30/2015   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   History of colonic polyps 09/30/2015   Gastric polyps 09/30/2015   Hip pain 09/05/2014   Urinary frequency 09/05/2014   Preventative health care 09/05/2014   Sun-damaged skin 09/05/2014   Anemia 08/24/2014   Cervical cancer screening 08/24/2014   Myalgia and myositis 02/11/2014   Benign paroxysmal positional vertigo 07/12/2013   History of chicken pox    Chest pain 04/23/2013   HTN (hypertension) 04/23/2013   Headache 04/23/2013   Nodule of left lung 03/30/2011   Allergic rhinitis    Arthritis    Gastroesophageal reflux disease with hiatal hernia    IBS (irritable bowel syndrome)     Past Surgical History:  Procedure Laterality Date  APPENDECTOMY  1989   atypical nevus excision     BREAST BIOPSY Right    CHOLECYSTECTOMY  1989   COLONOSCOPY  2011   COLONOSCOPY WITH PROPOFOL N/A 05/27/2015   Procedure: COLONOSCOPY WITH PROPOFOL;  Surgeon: Clarene Essex, MD;  Location: [facility];  Service: Endoscopy;  Laterality: N/A;   CYSTOSCOPY W/ URETERAL STENT REMOVAL  1999   ESOPHAGOGASTRODUODENOSCOPY  2011   with esophageal stent, perforation    ESOPHAGOGASTRODUODENOSCOPY (EGD) WITH PROPOFOL N/A 05/27/2015   Procedure: ESOPHAGOGASTRODUODENOSCOPY (EGD) WITH PROPOFOL;  Surgeon: Clarene Essex, MD;  Location: [facility];  Service: Endoscopy;  Laterality: N/A;   HEMORRHOID SURGERY  2005   LEFT OOPHORECTOMY  [id number]   complicated by ureteral injuery  requiring a ureteral stent    MMK / ANTERIOR VESICOURETHROPEXY / URETHROPEXY  2000   TONSILLECTOMY AND ADENOIDECTOMY     TOTAL ABDOMINAL HYSTERECTOMY  2000   TUBAL LIGATION  1981   wisdom teeth removal  1964     OB History   No obstetric history on file.     Family History  Problem Relation Age of Onset   Colon polyps Father    Hypertension Father    Heart failure Father    Melanoma Father        multiple   Heart attack Father        x2   Heart disease Father        Bundle Block   Meniere's disease Father    Cancer Father        melanoma   Osteoporosis Mother    Transient ischemic attack Mother    Cancer Mother        colon   Heart disease Mother        afib   Hyperlipidemia Mother    Diabetes Paternal Grandmother    Stroke Maternal Grandmother    Cancer Maternal Grandmother        skin, melanoma, sarcoma   Colon polyps Maternal Grandmother    Diverticulitis Maternal Grandmother    Stroke Maternal Grandfather    Heart disease Maternal Grandfather    Obesity Brother     Social History   Tobacco Use   Smoking status: Never   Smokeless tobacco: Never  Vaping Use   Vaping Use: Never used  Substance Use Topics   Alcohol use: No   Drug use: No    Home Medications Prior to Admission medications   Medication Sig Start Date End Date Taking? Authorizing Provider  HYDROcodone-acetaminophen (NORCO/VICODIN) 5-325 MG tablet Take 1 tablet by mouth every 6 (six) hours as needed. 10/14/20  Yes Rayna Sexton, PA-C  benzocaine (ORAJEL) 10 % mucosal gel Use as directed 1 application in the mouth or throat as needed for mouth pain. Reported on 07/22/2015    [provider]  Cholecalciferol (VITAMIN D3) 50 MCG (2000 UT) TABS Take by mouth. Take 1 tablet daily Monday-Friday. Take 2 tablets on Saturday.    [provider]  COVID-19 mRNA Vac-TriS, Pfizer, (PFIZER-BIONT COVID-19 VAC-TRIS) SUSP injection Inject into the muscle. 07/22/20   Carlyle Basques, MD   esomeprazole (NEXIUM) 40 MG capsule TAKE 1 CAPSULE BY MOUTH ONCE A DAY 07/26/20     famotidine (PEPCID) 20 MG tablet TAKE 1 TABLET BY MOUTH AT BEDTIME AS NEEDED TWICE A DAY 01/19/20 01/18/21  Clarene Essex, MD  fexofenadine (ALLEGRA) 180 MG tablet Take 1 tablet (180 mg total) by mouth daily as needed for allergies or rhinitis. 07/07/20   Mosie Lukes, MD  fluticasone (FLONASE) 50 MCG/ACT nasal spray Place 2 sprays into both nostrils daily. 07/07/20   Mosie Lukes, MD  hydrochlorothiazide (MICROZIDE) 12.5 MG capsule TAKE 1 CAPSULE (12.5 MG TOTAL) BY MOUTH DAILY. TAKE IF > 110/70 10/13/20 10/13/21  Mosie Lukes, MD  Lidocaine-Hydrocortisone Ace 3-0.5 % CREA Apply 1 application topically daily as needed (hemmoriods.).     [provider]  lisinopril (ZESTRIL) 10 MG tablet Take 1 tablet (10 mg total) by mouth 2 (two) times daily. 10/13/20   Mosie Lukes, MD  meclizine (ANTIVERT) 25 MG tablet Take 1 tablet (25 mg total) by mouth 3 (three) times daily as needed for dizziness. 10/13/20   Mosie Lukes, MD  metoprolol succinate (TOPROL-XL) 50 MG 24 hr tablet Take 1 tablet (50 mg total) by mouth in the morning and at bedtime. 10/13/20 10/13/21  Mosie Lukes, MD  ondansetron (ZOFRAN) 8 MG tablet Take 1 tablet (8 mg total) by mouth every 8 (eight) hours as needed for nausea or vomiting. 10/13/20   Mosie Lukes, MD  SIMPLY SALINE NA Place 1 each into the nose daily.     [provider]    Allergies    Pseudoephedrine hcl er, Singulair [montelukast sodium], Cefdinir, Celebrex [celecoxib], Ciprofloxacin, and Codeine  Review of Systems   Review of Systems  HENT:  Positive for facial swelling.   Cardiovascular:  Negative for chest pain.  Gastrointestinal:  Negative for abdominal pain.  Musculoskeletal:  Positive for arthralgias and myalgias. Negative for back pain and neck pain.  Skin:  Positive for color change and wound.  Neurological:  Positive for headaches. Negative for  syncope, weakness and numbness.   Physical Exam Updated Vital Signs BP (!) 156/85 (BP Location: Left Arm)   Pulse 63   Temp 98.5 F (36.9 C) (Oral)   Resp 20   Ht 5' 0.5" (1.537 m)   Wt 73.9 kg   SpO2 100%   BMI 31.31 kg/m   Physical Exam Vitals and nursing note reviewed.  Constitutional:      General: She is not in acute distress.    Appearance: Normal appearance. She is not ill-appearing, toxic-appearing or diaphoretic.  HENT:     Head: Normocephalic.     Comments: Mild ecchymosis with a small abrasion noted along the right periorbital region.  No significant edema.    Right Ear: External ear normal.     Left Ear: External ear normal.     Nose: Nose normal.     Mouth/Throat:     Mouth: Mucous membranes are moist.     Pharynx: Oropharynx is clear. No oropharyngeal exudate or posterior oropharyngeal erythema.  Eyes:     General: No scleral icterus.       Right eye: No discharge.        Left eye: No discharge.     Extraocular Movements: Extraocular movements intact.     Conjunctiva/sclera: Conjunctivae normal.     Pupils: Pupils are equal, round, and reactive to light.     Comments: Pupils are equal, round, and reactive to light.  Extraocular movements are intact.  Sclera clear.  Neck:     Comments: No midline C, T, or L-spine tenderness. Cardiovascular:     Rate and Rhythm: Normal rate.     Pulses: Normal pulses.  Pulmonary:     Effort: Pulmonary effort is normal.  Abdominal:     General: Abdomen is flat. There is no distension.  Musculoskeletal:  General: Swelling and tenderness present. Normal range of motion.     Cervical back: Normal range of motion and neck supple. No tenderness.     Comments: Mild to moderate tenderness noted along the palmar aspect of the right hand extending into the volar aspect of the right wrist.  Mild ecchymosis and edema in the region.  No snuffbox tenderness.  Grip strength intact.  Good cap refill.  Distal sensation  intact.  Mild tenderness with an abrasion to the right anterior the knee.  No significant effusion noted in the joint.  Full range of motion of the right knee.  Skin:    General: Skin is warm and dry.     Findings: Bruising present.  Neurological:     General: No focal deficit present.     Mental Status: She is alert and oriented to person, place, and time.  Psychiatric:        Mood and Affect: Mood normal.        Behavior: Behavior normal.    ED Results / Procedures / Treatments   Labs (all labs ordered are listed, but only abnormal results are displayed) Labs Reviewed - No data to display  EKG None  Radiology DG Wrist Complete Right  Result Date: 10/14/2020 CLINICAL DATA:  70 year old female with fall and right wrist pain. EXAM: RIGHT WRIST - COMPLETE 3+ VIEW COMPARISON:  Right hand radiograph dated 10/14/2020. FINDINGS: Faint small linear density along the dorsal aspect of the wrist concerning for possible cortical fracture of the triquetrum. Correlation with clinical exam and point tenderness recommended. No other acute fracture. The bones are osteopenic. There is no dislocation. Mild soft tissue swelling of the wrist. No radiopaque foreign object or soft tissue gas. IMPRESSION: Findings concerning for a nondisplaced cortical fracture of the triquetrum. Electronically Signed   By: Anner Crete M.D.   On: 10/14/2020 16:16   CT Head Wo Contrast  Result Date: 10/14/2020 CLINICAL DATA:  Facial injury after fall. EXAM: CT HEAD WITHOUT CONTRAST CT MAXILLOFACIAL WITHOUT CONTRAST CT CERVICAL SPINE WITHOUT CONTRAST TECHNIQUE: Multidetector CT imaging of the head, cervical spine, and maxillofacial structures were performed using the standard protocol without intravenous contrast. Multiplanar CT image reconstructions of the cervical spine and maxillofacial structures were also generated. COMPARISON:  September 14, 2017. FINDINGS: CT HEAD FINDINGS Brain: No evidence of acute infarction,  hemorrhage, hydrocephalus, extra-axial collection or mass lesion/mass effect. Vascular: No hyperdense vessel or unexpected calcification. Skull: Normal. Negative for fracture or focal lesion. Other: None. CT MAXILLOFACIAL FINDINGS Osseous: No fracture or mandibular dislocation. No destructive process. Orbits: Negative. No traumatic or inflammatory finding. Sinuses: Clear. Soft tissues: 1.1 cm rounded abnormality is seen within the inferior portion of the right parotid gland that may be vascular. No other soft tissue abnormality is noted. CT CERVICAL SPINE FINDINGS Alignment: Normal. Skull base and vertebrae: No acute fracture. No primary bone lesion or focal pathologic process. Soft tissues and spinal canal: No prevertebral fluid or swelling. No visible canal hematoma. Disc levels: Moderate degenerative disc disease is noted at C5-6 and C6-7 with anterior posterior osteophyte formation. Upper chest: Negative. Other: None. IMPRESSION: No acute intracranial abnormality seen. 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 No acute abnormality is noted. No evidence of traumatic injury seen in the maxillofacial region. However, 1.1 cm rounded abnormality is seen within inferior portion of right parotid gland which may be vascular. MRI is recommended for further evaluation. Electronically Signed   By: Marijo Conception  M.D.   On: 10/14/2020 16:12   CT Cervical Spine Wo Contrast  Result Date: 10/14/2020 CLINICAL DATA:  Facial injury after fall. EXAM: CT HEAD WITHOUT CONTRAST CT MAXILLOFACIAL WITHOUT CONTRAST CT CERVICAL SPINE WITHOUT CONTRAST TECHNIQUE: Multidetector CT imaging of the head, cervical spine, and maxillofacial structures were performed using the standard protocol without intravenous contrast. Multiplanar CT image reconstructions of the cervical spine and maxillofacial structures were also generated. COMPARISON:  September 14, 2017. FINDINGS: CT HEAD FINDINGS Brain: No evidence of  acute infarction, hemorrhage, hydrocephalus, extra-axial collection or mass lesion/mass effect. Vascular: No hyperdense vessel or unexpected calcification. Skull: Normal. Negative for fracture or focal lesion. Other: None. CT MAXILLOFACIAL FINDINGS Osseous: No fracture or mandibular dislocation. No destructive process. Orbits: Negative. No traumatic or inflammatory finding. Sinuses: Clear. Soft tissues: 1.1 cm rounded abnormality is seen within the inferior portion of the right parotid gland that may be vascular. No other soft tissue abnormality is noted. CT CERVICAL SPINE FINDINGS Alignment: Normal. Skull base and vertebrae: No acute fracture. No primary bone lesion or focal pathologic process. Soft tissues and spinal canal: No prevertebral fluid or swelling. No visible canal hematoma. Disc levels: Moderate degenerative disc disease is noted at C5-6 and C6-7 with anterior posterior osteophyte formation. Upper chest: Negative. Other: None. IMPRESSION: No acute intracranial abnormality seen. 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 No acute abnormality is noted. No evidence of traumatic injury seen in the maxillofacial region. However, 1.1 cm rounded abnormality is seen within inferior portion of right parotid gland which may be vascular. MRI is recommended for further evaluation. Electronically Signed   By: Marijo Conception M.D.   On: 10/14/2020 16:12   DG Knee Complete 4 Views Right  Result Date: 10/14/2020 CLINICAL DATA:  Pain after fall. EXAM: RIGHT KNEE - COMPLETE 4+ VIEW COMPARISON:  None. FINDINGS: No fracture or dislocation. Mild tricompartmental peripheral spurring with preservation of joint spaces. No joint effusion. No focal soft tissue abnormality. IMPRESSION: 1. No fracture or dislocation of the right knee. 2. Mild tricompartmental osteoarthritis. Electronically Signed   By: Keith Rake M.D.   On: 10/14/2020 16:17   DG Hand Complete Right  Result Date:  10/14/2020 CLINICAL DATA:  Right hand and wrist pain after fall. EXAM: RIGHT HAND - COMPLETE 3+ VIEW COMPARISON:  None. FINDINGS: Acute minimally displaced fracture at the base of the fifth metacarpal. No convincing intra-articular extension. Adjacent soft tissue edema. No other fracture of the hand. Mild osteoarthritis of the distal interphalangeal joints of the digits, most prominently affecting the index finger. Minimal osteoarthritis at the thumb carpal metacarpal joint. IMPRESSION: Acute minimally displaced fracture at the base of the fifth metacarpal. Electronically Signed   By: Keith Rake M.D.   On: 10/14/2020 16:15   CT Maxillofacial Wo Contrast  Result Date: 10/14/2020 CLINICAL DATA:  Facial injury after fall. EXAM: CT HEAD WITHOUT CONTRAST CT MAXILLOFACIAL WITHOUT CONTRAST CT CERVICAL SPINE WITHOUT CONTRAST TECHNIQUE: Multidetector CT imaging of the head, cervical spine, and maxillofacial structures were performed using the standard protocol without intravenous contrast. Multiplanar CT image reconstructions of the cervical spine and maxillofacial structures were also generated. COMPARISON:  September 14, 2017. FINDINGS: CT HEAD FINDINGS Brain: No evidence of acute infarction, hemorrhage, hydrocephalus, extra-axial collection or mass lesion/mass effect. Vascular: No hyperdense vessel or unexpected calcification. Skull: Normal. Negative for fracture or focal lesion. Other: None. CT MAXILLOFACIAL FINDINGS Osseous: No fracture or mandibular dislocation. No destructive process. Orbits: Negative. No traumatic or inflammatory  finding. Sinuses: Clear. Soft tissues: 1.1 cm rounded abnormality is seen within the inferior portion of the right parotid gland that may be vascular. No other soft tissue abnormality is noted. CT CERVICAL SPINE FINDINGS Alignment: Normal. Skull base and vertebrae: No acute fracture. No primary bone lesion or focal pathologic process. Soft tissues and spinal canal: No prevertebral fluid  or swelling. No visible canal hematoma. Disc levels: Moderate degenerative disc disease is noted at C5-6 and C6-7 with anterior posterior osteophyte formation. Upper chest: Negative. Other: None. IMPRESSION: No acute intracranial abnormality seen. 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 No acute abnormality is noted. No evidence of traumatic injury seen in the maxillofacial region. However, 1.1 cm rounded abnormality is seen within inferior portion of right parotid gland which may be vascular. MRI is recommended for further evaluation. Electronically Signed   By: Marijo Conception M.D.   On: 10/14/2020 16:12    Procedures Procedures   Medications Ordered in ED Medications - No data to display  ED Course  I have reviewed the triage vital signs and the nursing notes.  Pertinent labs & imaging results that were available during my care of the patient were reviewed by me and considered in my medical decision making (see chart for details).     MDM Rules/Calculators/A&P                          Pt is a 70 y.o. female who presents to the emergency department due to a fall that occurred prior to arrival.  Imaging: CT scan of the head without contrast shows no acute intracranial abnormalities. CT scan of the cervical spine without contrast shows moderate multilevel degenerative disc disease.  No acute abnormality. CT scan of the maxillofacial region shows no evidence of traumatic injury.  However there is a 1.1 cm rounded abnormality seen within the inferior portion of the right parotid gland which may be vascular.  They recommend MRI follow-up. X-ray of the right hand show acute minimally displaced fracture at the base of the fifth metacarpal. X-ray of the right knee shows no fracture or dislocation.  Mild tricompartmental osteoarthritis. X-ray of the right wrist shows findings concerning for a nondisplaced cortical fracture of the triquetrium.  I, Rayna Sexton, PA-C, personally reviewed and evaluated these images and lab results as part of my medical decision-making.  Discussed imaging results with the patient in length.  She is going to follow-up with her PCP regarding the possible parotid mass.  I discussed with Dr. Fredna Dow with hand surgery.  Does not feel that advanced imaging of the wrist/hand is necessary.  Recommends patient be placed in an ulnar gutter splint and have her follow-up on Monday outpatient.  This was discussed with the patient and she is amenable.  Feel the patient is stable for discharge at this time and she is agreeable.  Will prescribe a short course of Vicodin for breakthrough pain.  We discussed safety regarding this medication.  We discussed signs and symptoms associated with compartment syndrome and patient understands return to the emergency department if any of these should develop.  Her questions were answered and she was amicable at the time of discharge.  Note: Portions of this report may have been transcribed using voice recognition software. Every effort was made to ensure accuracy; however, inadvertent computerized transcription errors may be present.   Final Clinical Impression(s) / ED Diagnoses Final diagnoses:  Fall, initial encounter  Closed displaced fracture of triquetrum of right wrist, initial encounter  Closed displaced fracture of base of fifth metacarpal bone of right hand, initial encounter  Parotid mass   Rx / DC Orders ED Discharge Orders          Ordered    HYDROcodone-acetaminophen (NORCO/VICODIN) 5-325 MG tablet  Every 6 hours PRN        10/14/20 1720             Rayna Sexton, PA-C 10/14/20 1730    Drenda Freeze, MD 10/14/20 [phone]

## 2020-10-14 NOTE — Discharge Instructions (Addendum)
I have prescribed you a strong narcotic called Vicodin. Please only take this as prescribed. This medication also has tylenol in it, so please be sure you are not taking more than 3000 mg of tylenol per day. Do not drive or operate heavy machinery after taking this medication. Do not mix it with alcohol.   Please follow-up with Dr. Leanora Cover on Monday.  He has a Copy that I spoke with regarding your case.  If you develop any new or worsening symptoms please come back to the emergency department immediately for reevaluation.  It was a pleasure to meet you.

## 2020-10-16 ENCOUNTER — Encounter: Payer: Self-pay | Admitting: Family Medicine

## 2020-10-16 DIAGNOSIS — H029 Unspecified disorder of eyelid: Secondary | ICD-10-CM | POA: Insufficient documentation

## 2020-10-16 DIAGNOSIS — R110 Nausea: Secondary | ICD-10-CM | POA: Insufficient documentation

## 2020-10-16 DIAGNOSIS — G4700 Insomnia, unspecified: Secondary | ICD-10-CM | POA: Insufficient documentation

## 2020-10-16 DIAGNOSIS — N8110 Cystocele, unspecified: Secondary | ICD-10-CM | POA: Insufficient documentation

## 2020-10-16 NOTE — Assessment & Plan Note (Addendum)
Well controlled, no changes to meds. Encouraged heart healthy diet such as the DASH diet and exercise as tolerated.  °

## 2020-10-16 NOTE — Assessment & Plan Note (Signed)
worsening and diffuse but especially noted in her great toes. Consider checking a uric acid with next blood draw. Hydrate well and report concerns

## 2020-10-16 NOTE — Assessment & Plan Note (Signed)
Small punctate lesion left upper eyelid. She is referred back to her opthamologist for consideration of excision

## 2020-10-16 NOTE — Assessment & Plan Note (Signed)
She believes she has a cystocele or even a rectocele and is noting worsening incontinence sos he is referred to OB/GYN for further evaluation and consideration of treatment options.

## 2020-10-16 NOTE — Assessment & Plan Note (Signed)
Struggles with occasional nausea and responds to ondansetron. She is allowed a refill today

## 2020-10-16 NOTE — Assessment & Plan Note (Signed)
Supplement and monitor 

## 2020-10-16 NOTE — Assessment & Plan Note (Addendum)
Encouraged good sleep hygiene such as dark, quiet room. No blue/green glowing lights such as computer screens in bedroom. No alcohol or stimulants in evening. Cut down on caffeine as able. Regular exercise is helpful but not just prior to bed time. Consider Elavil 10-20 mg qhs prn if persists

## 2020-10-16 NOTE — Assessment & Plan Note (Signed)
Following with gastroenterology dr Tami Lin, using Nexium and Omeprazole

## 2020-10-17 ENCOUNTER — Other Ambulatory Visit (HOSPITAL_BASED_OUTPATIENT_CLINIC_OR_DEPARTMENT_OTHER): Payer: Self-pay

## 2020-10-17 ENCOUNTER — Other Ambulatory Visit: Payer: Self-pay | Admitting: Family Medicine

## 2020-10-17 DIAGNOSIS — Z780 Asymptomatic menopausal state: Secondary | ICD-10-CM

## 2020-10-17 DIAGNOSIS — R519 Headache, unspecified: Secondary | ICD-10-CM

## 2020-10-17 DIAGNOSIS — D490 Neoplasm of unspecified behavior of digestive system: Secondary | ICD-10-CM

## 2020-10-17 DIAGNOSIS — E2839 Other primary ovarian failure: Secondary | ICD-10-CM

## 2020-10-17 DIAGNOSIS — R220 Localized swelling, mass and lump, head: Secondary | ICD-10-CM

## 2020-10-19 ENCOUNTER — Other Ambulatory Visit (HOSPITAL_BASED_OUTPATIENT_CLINIC_OR_DEPARTMENT_OTHER): Payer: Self-pay

## 2020-10-20 ENCOUNTER — Inpatient Hospital Stay (HOSPITAL_BASED_OUTPATIENT_CLINIC_OR_DEPARTMENT_OTHER): Admission: RE | Admit: 2020-10-20 | Payer: Medicare Other | Source: Ambulatory Visit

## 2020-10-20 ENCOUNTER — Ambulatory Visit: Payer: Medicare Other | Admitting: Pulmonary Disease

## 2020-10-20 ENCOUNTER — Telehealth (HOSPITAL_BASED_OUTPATIENT_CLINIC_OR_DEPARTMENT_OTHER): Payer: Self-pay | Admitting: Obstetrics & Gynecology

## 2020-10-20 NOTE — Telephone Encounter (Signed)
Dr. Sabra Heck this the patient that was referral to you for N81.10 (ICD-10-CM) - Cystocele, unspecified and you want to forward this to you .

## 2020-10-21 ENCOUNTER — Telehealth: Payer: Self-pay | Admitting: Family Medicine

## 2020-10-21 NOTE — Telephone Encounter (Signed)
Pt dropped off copy of her Vaccine that was administrated to her to have on her chart. (1 page) Document put at front office tray under providers name.

## 2020-10-24 NOTE — Telephone Encounter (Signed)
Med list has been updated 

## 2020-10-24 NOTE — Telephone Encounter (Signed)
Immunization updated in chart

## 2020-10-25 ENCOUNTER — Encounter: Payer: Self-pay | Admitting: Family

## 2020-10-27 DIAGNOSIS — M25561 Pain in right knee: Secondary | ICD-10-CM | POA: Diagnosis not present

## 2020-10-27 DIAGNOSIS — M1712 Unilateral primary osteoarthritis, left knee: Secondary | ICD-10-CM | POA: Diagnosis not present

## 2020-10-29 ENCOUNTER — Other Ambulatory Visit: Payer: Self-pay

## 2020-10-29 ENCOUNTER — Ambulatory Visit (HOSPITAL_BASED_OUTPATIENT_CLINIC_OR_DEPARTMENT_OTHER)
Admission: RE | Admit: 2020-10-29 | Discharge: 2020-10-29 | Disposition: A | Discharge status: Home / Self Care | Payer: Medicare Other | Source: Ambulatory Visit | Attending: Family Medicine | Admitting: Family Medicine

## 2020-10-29 DIAGNOSIS — R220 Localized swelling, mass and lump, head: Secondary | ICD-10-CM | POA: Diagnosis not present

## 2020-10-29 DIAGNOSIS — K118 Other diseases of salivary glands: Secondary | ICD-10-CM | POA: Diagnosis not present

## 2020-10-29 MED ORDER — GADOBUTROL 1 MMOL/ML IV SOLN
7.0000 mL | Freq: Once | INTRAVENOUS | Status: AC | PRN
  Administered 2020-10-29: 7 mL via INTRAVENOUS

## 2020-11-09 DIAGNOSIS — L570 Actinic keratosis: Secondary | ICD-10-CM | POA: Diagnosis not present

## 2020-11-21 DIAGNOSIS — D23111 Other benign neoplasm of skin of right upper eyelid, including canthus: Secondary | ICD-10-CM | POA: Diagnosis not present

## 2020-11-21 DIAGNOSIS — D23121 Other benign neoplasm of skin of left upper eyelid, including canthus: Secondary | ICD-10-CM | POA: Diagnosis not present

## 2020-11-21 DIAGNOSIS — H524 Presbyopia: Secondary | ICD-10-CM | POA: Diagnosis not present

## 2020-11-22 ENCOUNTER — Ambulatory Visit (INDEPENDENT_AMBULATORY_CARE_PROVIDER_SITE_OTHER): Payer: Medicare Other | Admitting: Pulmonary Disease

## 2020-11-22 ENCOUNTER — Other Ambulatory Visit (HOSPITAL_BASED_OUTPATIENT_CLINIC_OR_DEPARTMENT_OTHER): Payer: Self-pay

## 2020-11-22 ENCOUNTER — Other Ambulatory Visit: Payer: Self-pay

## 2020-11-22 ENCOUNTER — Encounter: Payer: Self-pay | Admitting: Family

## 2020-11-22 VITALS — BP 116/72 | HR 77

## 2020-11-22 DIAGNOSIS — J381 Polyp of vocal cord and larynx: Secondary | ICD-10-CM | POA: Diagnosis not present

## 2020-11-22 DIAGNOSIS — G4730 Sleep apnea, unspecified: Secondary | ICD-10-CM

## 2020-11-22 NOTE — Progress Notes (Signed)
Subjective:     Patient ID: Carrie Elliott, female   DOB: 05/22/1950, 70 y.o.   MRN: [id number]  Follow-up for mild obstructive sleep apnea On treatment with CPAP  Good compliance with CPAP Compared to the last time she was here she feels that daytime activities are not as good Energy level is also a little bit down  Feels a little bit tired during the middle of the day  She feels the machine is working well  Not waking up many more times during the night  No changes in her health generally No medication changes Remains very compliant with CPAP use  Sleep pattern has not really changed much Usually tries to go to bed between 10 and 10:30 PM Takes about 30 minutes to 1 hour to fall asleep Wakes up about once to 3 times during the night Wakes up about 6 AM on workdays  Hypertension Never smoked  Not having any issues  Review of Systems  Constitutional: Negative.  Negative for fatigue.  Eyes: Negative.   Respiratory:  Positive for apnea. Negative for cough.   Gastrointestinal: Negative.   Endocrine: Negative.   Genitourinary: Negative.   Musculoskeletal: Negative.   Skin: Negative.   Hematological: Negative.   Psychiatric/Behavioral:  Positive for sleep disturbance.   All other systems reviewed and are negative. Past Medical History:  Diagnosis Date   Allergic rhinitis    Allergy    Anemia 08/24/2014   Arthritis    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, shoulders, knees   Benign paroxysmal positional vertigo 07/12/2013   Chicken pox    Cystocele    grade 2   Endometriosis    with Menometrorhaghia    Gastric polyps 09/30/2015   Gastroesophageal reflux disease with hiatal hernia    Cough Sees Dr Laretta Bolster    GERD (gastroesophageal reflux disease)    Hemorrhoids    History of colonic polyps    History of colonic polyps 09/30/2015   adenoma   HTN (hypertension) 04/23/2013   Hypocalcemia 09/30/2015   Hyponatremia 11/23/2016   IBS (irritable bowel syndrome)    Iron deficiency anemia 06/11/2019    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   Measles    Mumps    Muscle spasm 09/30/2015   Osteopenia 10/09/2015   Rectocele    grade 2   Reflux    cough   Rosacea    Sternal fracture    1986   Sun-damaged skin 09/05/2014   Vertigo 11/23/2016   Vitamin D deficiency 04/06/2016   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, avoids spicy  Other Topics Concern   Not on file  Social History Narrative   Not on file   Social Determinants of Health   Financial Resource Strain: Not on file  Food Insecurity: Not on file  Transportation Needs: Not on file  Physical Activity: Not on file  Stress: Not on file  Social Connections:  Not on file  Intimate Partner Violence: Not on file   Family History  Problem Relation Age of Onset   Colon polyps Father    Hypertension Father    Heart failure Father    Melanoma Father        multiple   Heart attack Father        x2   Heart disease Father        Bundle Block   Meniere's disease Father    Cancer Father        melanoma   Osteoporosis Mother    Transient ischemic attack Mother    Cancer Mother        colon   Heart disease Mother        afib   Hyperlipidemia Mother    Diabetes Paternal Grandmother    Stroke Maternal Grandmother    Cancer Maternal Grandmother        skin, melanoma, sarcoma   Colon polyps Maternal Grandmother    Diverticulitis Maternal Grandmother    Stroke Maternal Grandfather    Heart disease Maternal Grandfather    Obesity Brother         Objective:   Physical Exam Constitutional:      Appearance: Normal appearance. She is obese.  HENT:     Head: Normocephalic and atraumatic.     Mouth/Throat:     Mouth: Mucous membranes are moist.     Comments: Crowded oropharynx, Mallampati 2 Eyes:     Pupils: Pupils are equal, round, and reactive to light.  Cardiovascular:     Rate and Rhythm: Normal rate and regular rhythm.     Heart sounds: No murmur heard.   No friction rub.  Pulmonary:     Effort: Pulmonary effort is normal. No respiratory distress.     Breath sounds: Normal breath sounds. No stridor. No wheezing or rhonchi.  Musculoskeletal:     Cervical back: No rigidity or tenderness. No muscular tenderness.  Neurological:     Mental Status: She is alert.  Psychiatric:        Mood and Affect: Mood normal.    Vitals:   11/22/20 1356  BP: 116/72  Pulse: 77  SpO2: 97%   Results of the Epworth flowsheet 09/30/2018  Sitting and reading 1  Watching TV 1  Sitting, inactive in a public place (e.g. a theatre or a meeting) 1  As a passenger in a car for an hour without a break 2  Lying down to rest in the afternoon when circumstances permit 2  Sitting and talking to someone 0  Sitting quietly after a lunch without alcohol 1  In a car, while stopped for a few minutes in traffic 0  Total score 8   Sleep study reviewed in epic  Compliance data reviewed showing 100% compliance Average use of 6 hours 49 minutes Machine set between 5 and 15 95 percentile pressure of 13.1 maximum pressure 14.3 residual AHI of 3.4    Assessment:     Mild obstructive sleep apnea -Symptoms controlled as previous -Having issues with some daytime sleepiness -We will continue with auto titrating CPAP but consider changing pressures   Excessive daytime sleepiness -Continue CPAP on a regular basis -Pressure changes -Limited daytime nap may be beneficial  Continues to benefit from CPAP use    Plan:     Importance of weight loss and  weight maintenance discussed with the patient  Continue using CPAP  Change CPAP pressures from 5-15 to 5-17  I will see her back in the office in 6 months  Encouraged to call with any significant concerns

## 2020-11-22 NOTE — Patient Instructions (Signed)
Continue using CPAP on a nightly basis  Adjusting pressure from 5-15-to 5-17  Adequate number of hours of sleep at night  You may need a scheduled nap during the day for about 20 to 30 minutes to help daytime sleepiness if change in the pressure does not seem to help  Call us if you notice any significant intolerance with the pressure change  I will see you back in 6 months

## 2020-11-24 ENCOUNTER — Other Ambulatory Visit: Payer: Self-pay

## 2020-11-24 ENCOUNTER — Ambulatory Visit (HOSPITAL_BASED_OUTPATIENT_CLINIC_OR_DEPARTMENT_OTHER)
Admission: RE | Admit: 2020-11-24 | Discharge: 2020-11-24 | Disposition: A | Discharge status: Home / Self Care | Payer: Medicare Other | Source: Ambulatory Visit | Attending: Family Medicine | Admitting: Family Medicine

## 2020-11-24 DIAGNOSIS — M8589 Other specified disorders of bone density and structure, multiple sites: Secondary | ICD-10-CM | POA: Diagnosis not present

## 2020-11-24 DIAGNOSIS — Z780 Asymptomatic menopausal state: Secondary | ICD-10-CM | POA: Insufficient documentation

## 2020-11-24 DIAGNOSIS — E2839 Other primary ovarian failure: Secondary | ICD-10-CM | POA: Insufficient documentation

## 2020-12-19 DIAGNOSIS — D23121 Other benign neoplasm of skin of left upper eyelid, including canthus: Secondary | ICD-10-CM | POA: Diagnosis not present

## 2020-12-19 DIAGNOSIS — D23111 Other benign neoplasm of skin of right upper eyelid, including canthus: Secondary | ICD-10-CM | POA: Diagnosis not present

## 2020-12-20 ENCOUNTER — Encounter: Payer: Self-pay | Admitting: Family Medicine

## 2020-12-21 ENCOUNTER — Other Ambulatory Visit: Payer: Self-pay

## 2020-12-21 DIAGNOSIS — Z780 Asymptomatic menopausal state: Secondary | ICD-10-CM

## 2020-12-21 DIAGNOSIS — E559 Vitamin D deficiency, unspecified: Secondary | ICD-10-CM

## 2020-12-21 DIAGNOSIS — E2839 Other primary ovarian failure: Secondary | ICD-10-CM

## 2020-12-21 DIAGNOSIS — I10 Essential (primary) hypertension: Secondary | ICD-10-CM

## 2020-12-21 DIAGNOSIS — D509 Iron deficiency anemia, unspecified: Secondary | ICD-10-CM

## 2020-12-21 DIAGNOSIS — E785 Hyperlipidemia, unspecified: Secondary | ICD-10-CM

## 2020-12-21 DIAGNOSIS — E538 Deficiency of other specified B group vitamins: Secondary | ICD-10-CM

## 2020-12-21 DIAGNOSIS — R739 Hyperglycemia, unspecified: Secondary | ICD-10-CM

## 2021-01-12 ENCOUNTER — Other Ambulatory Visit (INDEPENDENT_AMBULATORY_CARE_PROVIDER_SITE_OTHER): Payer: Medicare Other

## 2021-01-12 ENCOUNTER — Other Ambulatory Visit (HOSPITAL_BASED_OUTPATIENT_CLINIC_OR_DEPARTMENT_OTHER): Payer: Self-pay

## 2021-01-12 ENCOUNTER — Encounter: Payer: Self-pay | Admitting: Family

## 2021-01-12 ENCOUNTER — Other Ambulatory Visit: Payer: Self-pay

## 2021-01-12 DIAGNOSIS — D509 Iron deficiency anemia, unspecified: Secondary | ICD-10-CM

## 2021-01-12 DIAGNOSIS — E785 Hyperlipidemia, unspecified: Secondary | ICD-10-CM | POA: Diagnosis not present

## 2021-01-12 DIAGNOSIS — E2839 Other primary ovarian failure: Secondary | ICD-10-CM | POA: Diagnosis not present

## 2021-01-12 DIAGNOSIS — I10 Essential (primary) hypertension: Secondary | ICD-10-CM | POA: Diagnosis not present

## 2021-01-12 DIAGNOSIS — R739 Hyperglycemia, unspecified: Secondary | ICD-10-CM

## 2021-01-12 DIAGNOSIS — Z780 Asymptomatic menopausal state: Secondary | ICD-10-CM

## 2021-01-12 DIAGNOSIS — E538 Deficiency of other specified B group vitamins: Secondary | ICD-10-CM | POA: Diagnosis not present

## 2021-01-12 DIAGNOSIS — E559 Vitamin D deficiency, unspecified: Secondary | ICD-10-CM | POA: Diagnosis not present

## 2021-01-12 LAB — CBC WITH DIFFERENTIAL/PLATELET
Basophils Absolute: 0 10*3/uL (ref 0.0–0.1)
Basophils Relative: 0.8 % (ref 0.0–3.0)
Eosinophils Absolute: 0.2 10*3/uL (ref 0.0–0.7)
Eosinophils Relative: 3.7 % (ref 0.0–5.0)
HCT: 36.9 % (ref 36.0–46.0)
Hemoglobin: 12.2 g/dL (ref 12.0–15.0)
Lymphocytes Relative: 24.3 % (ref 12.0–46.0)
Lymphs Abs: 1.1 10*3/uL (ref 0.7–4.0)
MCHC: 33.1 g/dL (ref 30.0–36.0)
MCV: 91.8 fl (ref 78.0–100.0)
Monocytes Absolute: 0.5 10*3/uL (ref 0.1–1.0)
Monocytes Relative: 10.1 % (ref 3.0–12.0)
Neutro Abs: 2.8 10*3/uL (ref 1.4–7.7)
Neutrophils Relative %: 61.1 % (ref 43.0–77.0)
Platelets: 226 10*3/uL (ref 150.0–400.0)
RBC: 4.02 Mil/uL (ref 3.87–5.11)
RDW: 13.5 % (ref 11.5–15.5)
WBC: 4.6 10*3/uL (ref 4.0–10.5)

## 2021-01-12 LAB — COMPREHENSIVE METABOLIC PANEL
ALT: 16 U/L (ref 0–35)
AST: 17 U/L (ref 0–37)
Albumin: 4.3 g/dL (ref 3.5–5.2)
Alkaline Phosphatase: 48 U/L (ref 39–117)
BUN: 17 mg/dL (ref 6–23)
CO2: 28 mEq/L (ref 19–32)
Calcium: 9.6 mg/dL (ref 8.4–10.5)
Chloride: 100 mEq/L (ref 96–112)
Creatinine, Ser: 0.88 mg/dL (ref 0.40–1.20)
GFR: 66.64 mL/min (ref >60.00)
Glucose, Bld: 87 mg/dL (ref 70–99)
Potassium: 4.1 mEq/L (ref 3.5–5.1)
Sodium: 136 mEq/L (ref 135–145)
Total Bilirubin: 0.5 mg/dL (ref 0.2–1.2)
Total Protein: 7.1 g/dL (ref 6.0–8.3)

## 2021-01-12 LAB — TSH: TSH: 1.33 u[IU]/mL (ref 0.35–5.50)

## 2021-01-12 LAB — LIPID PANEL
Cholesterol: 214 mg/dL — ABNORMAL HIGH (ref 0–200)
HDL: 64.2 mg/dL (ref >39.00)
LDL Cholesterol: 118 mg/dL — ABNORMAL HIGH (ref 0–99)
NonHDL: 149.93
Total CHOL/HDL Ratio: 3
Triglycerides: 159 mg/dL — ABNORMAL HIGH (ref 0.0–149.0)
VLDL: 31.8 mg/dL (ref 0.0–40.0)

## 2021-01-12 LAB — VITAMIN B12: Vitamin B-12: 337 pg/mL (ref 211–911)

## 2021-01-12 LAB — HEMOGLOBIN A1C: Hgb A1c MFr Bld: 5.6 % (ref 4.6–6.5)

## 2021-01-16 LAB — VITAMIN D 1,25 DIHYDROXY
Vitamin D 1, 25 (OH)2 Total: 46 pg/mL (ref 18–72)
Vitamin D2 1, 25 (OH)2: <8 pg/mL
Vitamin D3 1, 25 (OH)2: 46 pg/mL

## 2021-01-19 ENCOUNTER — Ambulatory Visit (INDEPENDENT_AMBULATORY_CARE_PROVIDER_SITE_OTHER): Payer: Medicare Other | Admitting: Family Medicine

## 2021-01-19 ENCOUNTER — Other Ambulatory Visit: Payer: Self-pay

## 2021-01-19 ENCOUNTER — Encounter: Payer: Self-pay | Admitting: Family Medicine

## 2021-01-19 ENCOUNTER — Encounter: Payer: Self-pay | Admitting: *Deleted

## 2021-01-19 VITALS — BP 106/62 | HR 108 | Temp 97.6°F | Resp 16 | Wt 165.6 lb

## 2021-01-19 DIAGNOSIS — D509 Iron deficiency anemia, unspecified: Secondary | ICD-10-CM

## 2021-01-19 DIAGNOSIS — R739 Hyperglycemia, unspecified: Secondary | ICD-10-CM | POA: Diagnosis not present

## 2021-01-19 DIAGNOSIS — I10 Essential (primary) hypertension: Secondary | ICD-10-CM | POA: Diagnosis not present

## 2021-01-19 DIAGNOSIS — R296 Repeated falls: Secondary | ICD-10-CM | POA: Diagnosis not present

## 2021-01-19 DIAGNOSIS — E538 Deficiency of other specified B group vitamins: Secondary | ICD-10-CM

## 2021-01-19 DIAGNOSIS — R2681 Unsteadiness on feet: Secondary | ICD-10-CM

## 2021-01-19 DIAGNOSIS — M25562 Pain in left knee: Secondary | ICD-10-CM

## 2021-01-19 DIAGNOSIS — R42 Dizziness and giddiness: Secondary | ICD-10-CM | POA: Diagnosis not present

## 2021-01-19 DIAGNOSIS — E559 Vitamin D deficiency, unspecified: Secondary | ICD-10-CM | POA: Diagnosis not present

## 2021-01-19 DIAGNOSIS — E785 Hyperlipidemia, unspecified: Secondary | ICD-10-CM | POA: Diagnosis not present

## 2021-01-19 NOTE — Patient Instructions (Addendum)
Bone density shows osteopenia, which is thinner than normal but not as bad as osteoporosis. Recommend calcium intake of 1200 to 1500 mg daily, divided into roughly 3 doses. Best source is the diet and a single dairy serving is about 500 mg, a supplement of calcium citrate once or twice daily to balance diet is fine if not getting enough in diet. Also need Vitamin D 2000 IU caps, 1 cap daily if not already taking vitamin D. Also recommend weight baring exercise on hips and upper body to keep bones strong   Consider a daily multivitamin with minerals (with selenium) daily  Allegra 180 mg once to twice daily and Pepcid/Famotidine 20 mg once to twice daily, dawn dish soap and Witch Hazel astringent  Contact Dermatitis Dermatitis is redness, soreness, and swelling (inflammation) of the skin. Contact dermatitis is a reaction to something that touches the skin. There are two types of contact dermatitis: Irritant contact dermatitis. This happens when something bothers (irritates) your skin, like soap. Allergic contact dermatitis. This is caused when you are exposed to something that you are allergic to, such as poison ivy. What are the causes? Common causes of irritant contact dermatitis include: Makeup. Soaps. Detergents. Bleaches. Acids. Metals, such as nickel. Common causes of allergic contact dermatitis include: Plants. Chemicals. Jewelry. Latex. Medicines. Preservatives in products, such as clothing. What increases the risk? Having a job that exposes you to things that bother your skin. Having asthma or eczema. What are the signs or symptoms? Symptoms may happen anywhere the irritant has touched your skin. Symptoms include: Dry or flaky skin. Redness. Cracks. Itching. Pain or a burning feeling. Blisters. Blood or clear fluid draining from skin cracks. With allergic contact dermatitis, swelling may occur. This may happen in places such as the eyelids, mouth, or genitals. How is  this treated? This condition is treated by checking for the cause of the reaction and protecting your skin. Treatment may also include: Steroid creams, ointments, or medicines. Antibiotic medicines or other ointments, if you have a skin infection. Lotion or medicines to help with itching. A bandage (dressing). Follow these instructions at home: Skin care Moisturize your skin as needed. Put cool cloths on your skin. Put a baking soda paste on your skin. Stir water into baking soda until it looks like a paste. Do not scratch your skin. Avoid having things rub up against your skin. Avoid the use of soaps, perfumes, and dyes. Medicines Take or apply over-the-counter and prescription medicines only as told by your doctor. If you were prescribed an antibiotic medicine, take or apply it as told by your doctor. Do not stop using it even if your condition starts to get better. Bathing Take a bath with: Epsom salts. Baking soda. Colloidal oatmeal. Bathe less often. Bathe in warm water. Avoid using hot water. Bandage care If you were given a bandage, change it as told by your health care provider. Wash your hands with soap and water before and after you change your bandage. If soap and water are not available, use hand sanitizer. General instructions Avoid the things that caused your reaction. If you do not know what caused it, keep a journal. Write down: What you eat. What skin products you use. What you drink. What you wear in the area that has symptoms. This includes jewelry. Check the affected areas every day for signs of infection. Check for: More redness, swelling, or pain. More fluid or blood. Warmth. Pus or a bad smell. Keep all follow-up visits as  told by your doctor. This is important. Contact a doctor if: You do not get better with treatment. Your condition gets worse. You have signs of infection, such as: More swelling. Tenderness. More redness. Soreness. Warmth. You  have a fever. You have new symptoms. Get help right away if: You have a very bad headache. You have neck pain. Your neck is stiff. You throw up (vomit). You feel very sleepy. You see red streaks coming from the area. Your bone or joint near the area hurts after the skin has healed. The area turns darker. You have trouble breathing. Summary Dermatitis is redness, soreness, and swelling of the skin. Symptoms may occur where the irritant has touched you. Treatment may include medicines and skin care. If you do not know what caused your reaction, keep a journal. Contact a doctor if your condition gets worse or you have signs of infection. This information is not intended to replace advice given to you by your health care provider. Make sure you discuss any questions you have with your health care provider. Document Revised: 07/02/2018 Document Reviewed: 09/25/2017 Elsevier Patient Education  [place].

## 2021-01-19 NOTE — Assessment & Plan Note (Signed)
Supplement and monitor 

## 2021-01-19 NOTE — Assessment & Plan Note (Signed)
hgba1c acceptable, minimize simple carbs. Increase exercise as tolerated.  

## 2021-01-19 NOTE — Assessment & Plan Note (Signed)
Well controlled, no changes to meds. Encouraged heart healthy diet such as the DASH diet and exercise as tolerated.  °

## 2021-01-19 NOTE — Progress Notes (Signed)
Patient ID: Carrie Elliott, female    DOB: 12-29-1950  Age: 70 y.o. MRN: [id number]    Subjective:   Chief Complaint  Patient presents with   Follow-up   Subjective   HPI Carrie Elliott presents for office visit today for follow up on HTN and recent fall. She reports that she had 2 recent falls and in both she injured her head. In the most recent fall, she broke her right wrist and her right 5th metacarpal bone. She followed up with PT and reports that her right grip strength increased from 7 lbs to 20 lbs. She states that the recent falls might be due to type of shoe, minor vertigo episodes, or unsteady gait. Denies CP/palp/SOB/HA/congestion/fevers/GI or GU c/o. Taking meds as prescribed.  She saw a specialist for cortisol injection on left knee 06/27/2020 which she reports improved her knee pain. However that was before her falls and since her falls her knees started bothering her again.  While gardening she has had contact dermatitis that she states was itchy and firey on left arm.   Review of Systems  Constitutional:  Negative for chills, fatigue and fever.  HENT:  Negative for congestion, rhinorrhea, sinus pressure, sinus pain and sore throat.   Eyes:  Negative for pain.  Respiratory:  Negative for cough and shortness of breath.   Cardiovascular:  Negative for chest pain, palpitations and leg swelling.  Gastrointestinal:  Negative for abdominal pain, blood in stool, diarrhea, nausea and vomiting.  Genitourinary:  Negative for decreased urine volume, flank pain, frequency, vaginal bleeding and vaginal discharge.  Musculoskeletal:  Positive for gait problem. Negative for back pain.  Neurological:  Positive for dizziness. Negative for headaches.   History Past Medical History:  Diagnosis Date   Allergic rhinitis    Allergy    Anemia 08/24/2014   Arthritis    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, shoulders, knees   Benign paroxysmal positional vertigo 07/12/2013   Chicken pox    Cystocele    grade 2    Endometriosis    with Menometrorhaghia    Gastric polyps 09/30/2015   Gastroesophageal reflux disease with hiatal hernia    Cough Sees Dr Laretta Bolster    GERD (gastroesophageal reflux disease)    Hemorrhoids    History of colonic polyps    History of colonic polyps 09/30/2015   adenoma   HTN (hypertension) 04/23/2013   Hypocalcemia 09/30/2015   Hyponatremia 11/23/2016   IBS (irritable bowel syndrome)    Iron deficiency anemia 06/11/2019   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   Measles    Mumps    Muscle spasm 09/30/2015   Osteopenia 10/09/2015   Rectocele    grade 2   Reflux    cough   Rosacea    Sternal fracture    1986   Sun-damaged skin 09/05/2014   Vertigo 11/23/2016   Vitamin D deficiency 04/06/2016   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   Her family history includes Cancer in her father, maternal grandmother, and mother; Colon polyps in her father and maternal grandmother; Diabetes in her paternal grandmother; Diverticulitis in her  maternal grandmother; Heart attack in her father; Heart disease in her father, maternal grandfather, and mother; Heart failure in her father; Hyperlipidemia in her mother; Hypertension in her father; Melanoma in her father; Meniere's disease in her father; Obesity in her brother; Osteoporosis in her mother; Stroke in her maternal grandfather and maternal grandmother; Transient ischemic attack in her mother.She  reports that she has never smoked. She has never used smokeless tobacco. She reports that she does not drink alcohol and does not use drugs.  Current Outpatient Medications on File Prior to Visit  Medication Sig Dispense Refill   benzocaine (ORAJEL) 10 % mucosal gel Use as directed 1 application in the mouth or throat as needed for mouth pain. Reported on 07/22/2015     Cholecalciferol (VITAMIN D3) 50 MCG (2000 UT) TABS Take by mouth. Take 1 tablet daily Monday-Friday. Take 2 tablets on Saturday.     COVID-19 mRNA Vac-TriS, Pfizer, (PFIZER-BIONT COVID-19 VAC-TRIS) SUSP injection Inject into the muscle. 0.3 mL 0   dicyclomine (BENTYL) 10 MG capsule Take 1-2 capsules by mouth 4 (four) times daily as needed.     esomeprazole (NEXIUM) 40 MG capsule TAKE 1 CAPSULE BY MOUTH ONCE A DAY 90 capsule 3   fexofenadine (ALLEGRA) 180 MG tablet Take 1 tablet (180 mg total) by mouth daily as needed for allergies or rhinitis. 30 tablet 5   fluticasone (FLONASE) 50 MCG/ACT nasal spray Place 2 sprays into both nostrils daily. 16 g 6   hydrochlorothiazide (MICROZIDE) 12.5 MG capsule TAKE 1 CAPSULE (12.5 MG TOTAL) BY MOUTH DAILY. TAKE IF > 110/70 90 capsule 1   Lidocaine-Hydrocortisone Ace 3-0.5 % CREA Apply 1 application topically daily as needed (hemmoriods.).      lisinopril (ZESTRIL) 10 MG tablet Take 1 tablet (10 mg total) by mouth 2 (two) times daily. 180 tablet 1   meclizine (ANTIVERT) 25 MG tablet Take 1 tablet (25 mg total) by mouth 3 (three) times daily as needed for dizziness. 30 tablet 2   metoprolol succinate (TOPROL-XL) 50 MG 24 hr tablet Take 1 tablet (50 mg total) by mouth in the morning and at bedtime. 180 tablet 1   ondansetron (ZOFRAN) 8 MG tablet Take 1 tablet (8 mg total) by mouth every 8 (eight) hours as needed for nausea or vomiting. 90 tablet 1   SIMPLY SALINE NA Place 1 each into the nose daily.      famotidine (PEPCID) 20 MG tablet TAKE 1 TABLET BY MOUTH AT BEDTIME AS NEEDED TWICE A DAY 60  tablet 3   No current facility-administered medications on file prior to visit.     Objective:  Objective  Physical Exam Constitutional:      General: She is not in acute distress.    Appearance: Normal appearance. She is not ill-appearing or toxic-appearing.  HENT:     Head: Normocephalic and atraumatic.     Right Ear: Tympanic membrane, ear canal and external ear normal.     Left Ear: Tympanic membrane, ear canal and external ear normal.     Nose: No congestion or rhinorrhea.  Eyes:     Extraocular Movements: Extraocular movements intact.     Pupils: Pupils are equal, round, and reactive to light.  Cardiovascular:     Rate and Rhythm: Normal rate and regular rhythm.     Pulses: Normal pulses.     Heart sounds: Normal heart sounds. No murmur heard. Pulmonary:     Effort: Pulmonary effort is normal. No respiratory distress.  Breath sounds: Normal breath sounds. No wheezing, rhonchi or rales.  Abdominal:     General: Bowel sounds are normal.     Palpations: Abdomen is soft. There is no mass.     Tenderness: There is no abdominal tenderness. There is no guarding.     Hernia: No hernia is present.  Musculoskeletal:        General: Normal range of motion.     Cervical back: Normal range of motion and neck supple.  Skin:    General: Skin is warm and dry.  Neurological:     Mental Status: She is alert and oriented to person, place, and time.  Psychiatric:        Behavior: Behavior normal.   BP 106/62   Pulse (!) 108   Temp 97.6 F (36.4 C)   Resp 16   Wt 165 lb 9.6 oz (75.1 kg)   SpO2 97%   BMI 31.81 kg/m  Wt Readings from Last 3 Encounters:  01/19/21 165 lb 9.6 oz (75.1 kg)  10/14/20 163 lb (73.9 kg)  10/13/20 163 lb 9.6 oz (74.2 kg)     Lab Results  Component Value Date   WBC 4.6 01/12/2021   HGB 12.2 01/12/2021   HCT 36.9 01/12/2021   PLT 226.0 01/12/2021   GLUCOSE 87 01/12/2021   CHOL 214 (H) 01/12/2021   TRIG 159.0 (H) 01/12/2021   HDL 64.20  01/12/2021   LDLDIRECT 106.0 02/18/2018   LDLCALC 118 (H) 01/12/2021   ALT 16 01/12/2021   AST 17 01/12/2021   NA 136 01/12/2021   K 4.1 01/12/2021   CL 100 01/12/2021   CREATININE 0.88 01/12/2021   BUN 17 01/12/2021   CO2 28 01/12/2021   TSH 1.33 01/12/2021   INR 1.19 02/07/2010   HGBA1C 5.6 01/12/2021    DG Bone Density  Result Date: 11/24/2020 EXAM: DUAL X-RAY ABSORPTIOMETRY (DXA) FOR BONE MINERAL DENSITY IMPRESSION: [place] A Damyn Weitzel Your patient Jaleya Pebley completed a BMD test on 11/24/2020 using the [place] (analysis version: 16.SP2) manufactured by [employer]. The following summarizes the results of our evaluation. [facility] PATIENT: Name: Antonya, Leeder Patient ID: [id number] Birth Date: 1950/10/24 Height: 60.5 in. Gender: Female Measured: 11/24/2020 Weight: 163.4 lbs. Indications: Advanced Age, Caucasian, Estrogen Deficiency, Family History of Osteoporosis, History of Osteopenia, Hysterectomy, Oophorectomy ( Bilateral), Post-Menopausal Fractures: Hand Treatments: HRT, Vitamin D ASSESSMENT: The BMD measured at Femur Neck Right is 0.790 g/cm2 with a T-score of -1.8. This patient is considered osteopenic according to  [facility]) criteria. Compared with the prior study on,11/22/2017 the BMD of the total mean shows no statistically signficant change. The scan quality is good. Site Region Measured Date Measured Age WHO YA BMD Classification T-score AP Spine L1-L4 11/24/[age] 70.1 Osteopenia -1.6 0.984 g/cm2 AP Spine L1-L4 11/22/2017 67.1 Osteopenia -1.9 0.961 g/cm2 AP Spine L1-L4 07/11/2012 61.7 Osteopenia -1.6 1.000 g/cm2 DualFemur Neck Right 11/24/2020 70.1 Osteopenia -1.8 0.790 g/cm2 DualFemur Neck Right 11/22/2017 67.1 Osteopenia -1.5 0.827 g/cm2 DualFemur Neck Right 07/11/2012 61.7 Osteopenia -1.4 0.846 g/cm2 DualFemur Total Mean 11/24/2020 70.1 Normal -0.2 0.981 g/cm2 DualFemur Total Mean 11/22/2017 67.1 Normal -0.2 0.987 g/cm2 DualFemur Total Mean 07/11/2012  61.7 Normal 0.0 1.008 g/cm2 World Health Organization [facility]) criteria for post-menopausal, Caucasian Women: Normal       T-score at or above -1 SD Osteopenia   T-score between -1 and -2.5 SD Osteoporosis T-score at or below -2.5 SD RECOMMENDATION: 1. All patients should optimize calcium and vitamin  D intake. 2. Consider FDA-approved medical therapies in postmenopausal women and men aged 49 years and older, based on the following: a. A hip or vertebral(clinical or morphometric) fracture. b. T-Score < -2.5 at the femoral neck or spine after appropriate evaluation to exclude secondary causes c. Low bone mass (T-score between -1.0 and -2.5 at the femoral neck or spine) and a 10 year probability of a hip fracture >3% or a 10 year probability of major osteoporosis-related fracture > 20% based on the [place]-adapted WHO algorithm d. Clinical judgement and/or patient preferences may indicate treatment for people with 10-year fracture probabilities above or below these levels FOLLOW-UP: Patients with diagnosis of osteoporosis or at high risk for fracture should have regular bone mineral density tests. For patients eligible for Medicare, routine testing is allowed once every 2 years. The testing frequency can be increased to one year for patients who have rapidly progressing disease, those who are receiving or discontinuing medical therapy to restore bone mass, or have additional risk factors. I have reviewed this report, and agree with the above findings. [facility] Patient: Carrie Elliott Referring Physician: Mosie Lukes Birth Date: 1950/06/13 [age] Patient ID: [id number] Height: 60.5 in. Weight: 163.4 lbs. Measured: 11/24/2020 10:04:28 AM (16 SP 4) Gender: Female Ethnicity: White Analyzed: 11/24/2020 10:15:57 AM (16 SP 4) FRAX* 10-year Probability of Fracture Based on femoral neck BMD: DualFemur (Right) Major Osteoporotic Fracture: 10.4% Hip Fracture:                1.8% Population:                   [place] (Caucasian) Risk Factors:                None *FRAX is a Materials engineer of the [employer] for Metabolic Bone Disease, a World Pharmacologist (WHO) [facility]. ASSESSMENT: The probability of a major osteoporotic fracture is 10.4% within the next ten years. The probability of a hip fracture is 1.8% within the next ten years. Electronically Signed   By: Rolm Baptise M.D.   On: 11/24/2020 14:57     Assessment & Plan:  Plan    No orders of the defined types were placed in this encounter.   Problem List Items Addressed This Visit     HTN (hypertension) (Chronic)    Well controlled, no changes to meds. Encouraged heart healthy diet such as the DASH diet and exercise as tolerated.       Relevant Orders   TSH   Hemoglobin A1c   Iron deficiency anemia (Chronic)   Relevant Orders   CBC with Differential/Platelet   Vitamin D deficiency    Supplement and monitor      Relevant Orders   VITAMIN D 25 Hydroxy (Vit-D Deficiency, Fractures)   Hyperlipidemia    Encourage heart healthy diet such as MIND or DASH diet, increase exercise, avoid trans fats, simple carbohydrates and processed foods, consider a krill or fish or flaxseed oil cap daily.       Relevant Orders   Comprehensive metabolic panel   Lipid panel   Hemoglobin A1c   Hyperglycemia - Primary    hgba1c acceptable, minimize simple carbs. Increase exercise as tolerated.       Relevant Orders   CBC with Differential/Platelet   Comprehensive metabolic panel   Lipid panel   Hemoglobin A1c   Vitamin B12 deficiency    Supplement and monitor  Relevant Orders   Vitamin B12   Left knee pain    Encouraged moist heat and gentle stretching as tolerated. May try NSAIDs and prescription meds as directed and report if symptoms worsen or seek immediate care      Relevant Orders   Ambulatory referral to Physical Therapy   Recurrent falls    With some disequilibrium and unsteady  gait. Previous work up unremarkable. Is referred for physical therapy evaluation      Relevant Orders   Ambulatory referral to Physical Therapy   Other Visit Diagnoses     Unsteady gait       Relevant Orders   Ambulatory referral to Physical Therapy   Disequilibrium       Relevant Orders   Ambulatory referral to Physical Therapy       Follow-up: Return in about 4 months (around 05/22/2021), or labs on Feb 20 before f/u visit Feb 27th.  I, Suezanne Jacquet, acting as a scribe for Penni Homans, MD, have documented all relevent documentation on behalf of Penni Homans, MD, as directed by Penni Homans, MD while in the presence of Penni Homans, MD. DO:01/22/21.  I, Mosie Lukes, MD personally performed the services described in this documentation. All medical record entries made by the scribe were at my direction and in my presence. I have reviewed the chart and agree that the record reflects my personal performance and is accurate and complete

## 2021-01-19 NOTE — Assessment & Plan Note (Signed)
Encourage heart healthy diet such as MIND or DASH diet, increase exercise, avoid trans fats, simple carbohydrates and processed foods, consider a krill or fish or flaxseed oil cap daily.  °

## 2021-01-20 ENCOUNTER — Encounter: Payer: Self-pay | Admitting: Family Medicine

## 2021-01-22 DIAGNOSIS — R296 Repeated falls: Secondary | ICD-10-CM | POA: Insufficient documentation

## 2021-01-22 DIAGNOSIS — M25562 Pain in left knee: Secondary | ICD-10-CM | POA: Insufficient documentation

## 2021-01-22 NOTE — Assessment & Plan Note (Signed)
Encouraged moist heat and gentle stretching as tolerated. May try NSAIDs and prescription meds as directed and report if symptoms worsen or seek immediate care 

## 2021-01-22 NOTE — Assessment & Plan Note (Signed)
With some disequilibrium and unsteady gait. Previous work up unremarkable. Is referred for physical therapy evaluation

## 2021-01-25 ENCOUNTER — Ambulatory Visit: Payer: Medicare Other | Attending: Internal Medicine

## 2021-01-25 DIAGNOSIS — Z23 Encounter for immunization: Secondary | ICD-10-CM

## 2021-01-25 NOTE — Progress Notes (Signed)
   Covid-19 Vaccination Clinic  Name:  Carrie Elliott    MRN: [id number] DOB: 04-27-1950  01/25/2021  [name] was observed post Covid-19 immunization for 15 minutes without incident. She was provided with Vaccine Information Sheet and instruction to access the V-Safe system.   [name] was instructed to call 911 with any severe reactions post vaccine: Difficulty breathing  Swelling of face and throat  A fast heartbeat  A bad rash all over body  Dizziness and weakness   Immunizations Administered     Name Date Dose VIS Date Route   Pfizer Covid-19 Vaccine Bivalent Booster 01/25/2021  9:00 AM 0.3 mL 11/23/2020 Intramuscular   Manufacturer: [place]   Lot: [id number]   [place]: [place], PharmD, MBA Clinical Acute Care Pharmacist

## 2021-02-08 DIAGNOSIS — H43811 Vitreous degeneration, right eye: Secondary | ICD-10-CM | POA: Diagnosis not present

## 2021-02-14 ENCOUNTER — Other Ambulatory Visit (HOSPITAL_BASED_OUTPATIENT_CLINIC_OR_DEPARTMENT_OTHER): Payer: Self-pay

## 2021-02-14 DIAGNOSIS — Z23 Encounter for immunization: Secondary | ICD-10-CM | POA: Diagnosis not present

## 2021-02-14 MED ORDER — PFIZER COVID-19 VAC BIVALENT 30 MCG/0.3ML IM SUSP
INTRAMUSCULAR | 0 refills | Status: DC
  Filled 2021-02-14: qty 0.3, 1d supply, fill #0

## 2021-02-21 ENCOUNTER — Ambulatory Visit (INDEPENDENT_AMBULATORY_CARE_PROVIDER_SITE_OTHER): Payer: Medicare Other

## 2021-02-21 VITALS — Ht 61.0 in | Wt 165.0 lb

## 2021-02-21 DIAGNOSIS — Z0000 Encounter for general adult medical examination without abnormal findings: Secondary | ICD-10-CM

## 2021-02-21 NOTE — Progress Notes (Signed)
Subjective:   Carrie Elliott is a 70 y.o. female who presents for an Initial Medicare Annual Wellness Visit.  I connected with Tinea today by telephone and verified that I am speaking with the correct person using two identifiers. Location patient: home Location provider: work Persons participating in the virtual visit: patient, Marine scientist.    I discussed the limitations, risks, security and privacy concerns of performing an evaluation and management service by telephone and the availability of in person appointments. I also discussed with the patient that there may be a patient responsible charge related to this service. The patient expressed understanding and verbally consented to this telephonic visit.    Interactive audio and video telecommunications were attempted between this provider and patient, however failed, due to patient having technical difficulties OR patient did not have access to video capability.  We continued and completed visit with audio only.  Some vital signs may be absent or patient reported.   Time Spent with patient on telephone encounter: 20 minutes   Review of Systems     Cardiac Risk Factors include: advanced age (>36men, >24 women);dyslipidemia;hypertension;obesity (BMI >30kg/m2);sedentary lifestyle     Objective:    Today's Vitals   02/21/21 1509  Weight: 165 lb (74.8 kg)  Height: 5\' 1"  (1.549 m)   Body mass index is 31.18 kg/m.  Advanced Directives 02/21/2021 10/14/2020 10/13/2019 07/23/2019 06/11/2019 09/03/2018 05/31/2016  Does Patient Have a Medical Advance Directive? Yes No Yes Yes No No Yes  Type of Paramedic of [place];Living will - [place];Living will [place];Living will - - [place];Living will  Does patient want to make changes to medical advance directive? - - No - Patient declined - - - -  Copy of [place] in Chart? Yes - validated most  recent copy scanned in chart (See row information) - - - - - -  Would patient like information on creating a medical advance directive? - No - Patient declined - - No - Patient declined No - Patient declined -    Current Medications (verified) Outpatient Encounter Medications as of 02/21/2021  Medication Sig   benzocaine (ORAJEL) 10 % mucosal gel Use as directed 1 application in the mouth or throat as needed for mouth pain. Reported on 07/22/2015   Cholecalciferol (VITAMIN D3) 50 MCG (2000 UT) TABS Take by mouth. Take 1 tablet daily Monday-Friday. Take 2 tablets on Saturday.   dicyclomine (BENTYL) 10 MG capsule Take 1-2 capsules by mouth 4 (four) times daily as needed.   esomeprazole (NEXIUM) 40 MG capsule TAKE 1 CAPSULE BY MOUTH ONCE A DAY   fexofenadine (ALLEGRA) 180 MG tablet Take 1 tablet (180 mg total) by mouth daily as needed for allergies or rhinitis.   fluticasone (FLONASE) 50 MCG/ACT nasal spray Place 2 sprays into both nostrils daily.   hydrochlorothiazide (MICROZIDE) 12.5 MG capsule TAKE 1 CAPSULE (12.5 MG TOTAL) BY MOUTH DAILY. TAKE IF > 110/70   Lidocaine-Hydrocortisone Ace 3-0.5 % CREA Apply 1 application topically daily as needed (hemmoriods.).    lisinopril (ZESTRIL) 10 MG tablet Take 1 tablet (10 mg total) by mouth 2 (two) times daily.   meclizine (ANTIVERT) 25 MG tablet Take 1 tablet (25 mg total) by mouth 3 (three) times daily as needed for dizziness.   metoprolol succinate (TOPROL-XL) 50 MG 24 hr tablet Take 1 tablet (50 mg total) by mouth in the morning and at bedtime.   ondansetron (ZOFRAN) 8  MG tablet Take 1 tablet (8 mg total) by mouth every 8 (eight) hours as needed for nausea or vomiting.   SIMPLY SALINE NA Place 1 each into the nose daily.    COVID-19 mRNA bivalent vaccine, Pfizer, (PFIZER COVID-19 VAC BIVALENT) injection Inject into the muscle. (Patient not taking: Reported on 02/21/2021)   COVID-19 mRNA Vac-TriS, Pfizer, (PFIZER-BIONT COVID-19 VAC-TRIS) SUSP injection  Inject into the muscle. (Patient not taking: Reported on 02/21/2021)   famotidine (PEPCID) 20 MG tablet TAKE 1 TABLET BY MOUTH AT BEDTIME AS NEEDED TWICE A DAY   No facility-administered encounter medications on file as of 02/21/2021.    Allergies (verified) Pseudoephedrine hcl er, Singulair [montelukast sodium], Cefdinir, Celebrex [celecoxib], Ciprofloxacin, and Codeine   History: Past Medical History:  Diagnosis Date   Allergic rhinitis    Allergy    Anemia 08/24/2014   Arthritis    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, shoulders, knees   Benign paroxysmal positional vertigo 07/12/2013   Chicken pox    Cystocele    grade 2   Endometriosis    with Menometrorhaghia    Gastric polyps 09/30/2015   Gastroesophageal reflux disease with hiatal hernia    Cough Sees Dr Laretta Bolster    GERD (gastroesophageal reflux disease)    Hemorrhoids    History of colonic polyps    History of colonic polyps 09/30/2015   adenoma   HTN (hypertension) 04/23/2013   Hypocalcemia 09/30/2015   Hyponatremia 11/23/2016   IBS (irritable bowel syndrome)    Iron deficiency anemia 06/11/2019   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   Measles    Mumps    Muscle spasm 09/30/2015   Osteopenia 10/09/2015   Rectocele    grade 2   Reflux    cough   Rosacea    Sternal fracture    1986   Sun-damaged skin 09/05/2014   Vertigo 11/23/2016   Vitamin D deficiency 04/06/2016   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   Past Surgical History:  Procedure Laterality Date   APPENDECTOMY  1989   atypical nevus excision     BREAST BIOPSY Right    CHOLECYSTECTOMY  1989   COLONOSCOPY  2011   COLONOSCOPY WITH PROPOFOL N/A 05/27/2015   Procedure: COLONOSCOPY WITH PROPOFOL;  Surgeon: Clarene Essex, MD;  Location: [facility];  Service: Endoscopy;  Laterality: N/A;   CYSTOSCOPY W/ URETERAL STENT REMOVAL  1999   ESOPHAGOGASTRODUODENOSCOPY  2011   with esophageal stent, perforation    ESOPHAGOGASTRODUODENOSCOPY (EGD) WITH PROPOFOL N/A 05/27/2015   Procedure: ESOPHAGOGASTRODUODENOSCOPY  (EGD) WITH PROPOFOL;  Surgeon: Clarene Essex, MD;  Location: [facility];  Service: Endoscopy;  Laterality: N/A;   HEMORRHOID SURGERY  2005   LEFT OOPHORECTOMY  2725   complicated by ureteral injuery requiring a ureteral stent    MMK / ANTERIOR VESICOURETHROPEXY / URETHROPEXY  2000   TONSILLECTOMY AND ADENOIDECTOMY     TOTAL ABDOMINAL HYSTERECTOMY  2000   TUBAL LIGATION  1981   wisdom teeth removal  1964   Family History  Problem Relation Age of Onset   Colon polyps Father    Hypertension Father    Heart failure Father    Melanoma Father        multiple   Heart attack Father        x2   Heart disease Father        Bundle Block   Meniere's disease Father    Cancer Father        melanoma   Osteoporosis Mother    Transient  ischemic attack Mother    Cancer Mother        colon   Heart disease Mother        afib   Hyperlipidemia Mother    Diabetes Paternal Grandmother    Stroke Maternal Grandmother    Cancer Maternal Grandmother        skin, melanoma, sarcoma   Colon polyps Maternal Grandmother    Diverticulitis Maternal Grandmother    Stroke Maternal Grandfather    Heart disease Maternal Grandfather    Obesity Brother    Social History   Socioeconomic History   Marital status: Married    Spouse name: Not on file   Number of children: Not on file   Years of education: Not on file   Highest education level: Not on file  Occupational History   Not on file  Tobacco Use   Smoking status: Never   Smokeless tobacco: Never  Vaping Use   Vaping Use: Never used  Substance and Sexual Activity   Alcohol use: No   Drug use: No   Sexual activity: Not on file    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, avoids spicy  Other Topics Concern   Not on file  Social History Narrative   Not on file   Social Determinants of Health   Financial Resource Strain: Low Risk    Difficulty of Paying Living Expenses: Not hard at all  Food Insecurity: No Food Insecurity    Worried About Charity fundraiser in the Last Year: Never true   Arboriculturist in the Last Year: Never true  Transportation Needs: No Transportation Needs   Lack of Transportation (Medical): No   Lack of Transportation (Non-Medical): No  Physical Activity: Inactive   Days of Exercise per Week: 0 days   Minutes of Exercise per Session: 0 min  Stress: No Stress Concern Present   Feeling of Stress : Not at all  Social Connections: Socially Integrated   Frequency of Communication with Friends and Family: More than three times a week   Frequency of Social Gatherings with Friends and Family: More than three times a week   Attends Religious Services: More than 4 times per year   Active Member of [employer] or Organizations: Yes   Attends Music therapist: More than 4 times per year   Marital Status: Married    Tobacco Counseling Counseling given: Not Answered   Clinical Intake:  Pre-visit preparation completed: Yes  Pain : No/denies pain     BMI - recorded: 31.18 Nutritional Status: BMI > 30  Obese Nutritional Risks: None Diabetes: No  How often do you need to have someone help you when you read instructions, pamphlets, or other written materials from your doctor or pharmacy?: 1 - Never  Diabetic?No  Interpreter Needed?: No  Information entered by :: Caroleen Hamman LPN   Activities of Daily Living In your present state of health, do you have any difficulty performing the following activities: 02/21/2021 10/13/2020  Hearing? Y N  Comment mild -  Vision? N N  Difficulty concentrating or making decisions? N N  Walking or climbing stairs? N Y  Comment - has a balance problem  Dressing or bathing? N N  Doing errands, shopping? N N  Preparing Food and eating ? N -  Using the Toilet? N -  In the past six months, have you accidently leaked urine? N -  Do you have problems with loss of bowel control? N -  Managing your Medications? N -  Managing your Finances? N -   Housekeeping or managing your Housekeeping? N -  Some recent data might be hidden    Patient Care Team: Mosie Lukes, MD as PCP - General (Family Medicine) Clarene Essex, MD as Consulting Physician (Gastroenterology)  Indicate any recent Medical Services you may have received from other than [facility] providers in the past year (date may be approximate).     Assessment:   This is a routine wellness examination for Ivor.  Hearing/Vision screen Hearing Screening - Comments:: C/o mild hearing loss Vision Screening - Comments:: Last eye exam-10/2020-Dr. McCuen  Dietary issues and exercise activities discussed: Current Exercise Habits: The patient does not participate in regular exercise at present, Exercise limited by: None identified   Goals Addressed             This Visit's Progress    Patient Stated       Increase activity & decrease portion sizes       Depression Screen PHQ 2/9 Scores 02/21/2021 01/19/2021 05/11/2019 05/24/2017 05/24/2017 07/22/2015  PHQ - 2 Score 0 0 0 0 0 0  PHQ- 9 Score - - - 1 - -    Fall Risk Fall Risk  02/21/2021 10/13/2020 10/13/2020 05/11/2019 05/24/2017  Falls in the past year? 1 0 1 0 No  Number falls in past yr: 1 - 0 0 -  Injury with Fall? 1 - 0 0 -  Risk for fall due to : History of fall(s) - - - -  Follow up Falls prevention discussed - - - -    FALL RISK PREVENTION PERTAINING TO THE HOME:  Any stairs in or around the home? Yes  If so, are there any without handrails? No  Home free of loose throw rugs in walkways, pet beds, electrical cords, etc? Yes  Adequate lighting in your home to reduce risk of falls? Yes   ASSISTIVE DEVICES UTILIZED TO PREVENT FALLS:  Life alert? No  Use of a cane, walker or w/c? No  Grab bars in the bathroom? Yes  Shower chair or bench in shower? Yes  Elevated toilet seat or a handicapped toilet? Yes   TIMED UP AND GO:  Was the test performed? No . Phone visit   Cognitive Function:Normal cognitive status  assessed by this Nurse Health Advisor. No abnormalities found.          Immunizations Immunization History  Administered Date(s) Administered   Hepatitis A 01/21/2004, 10/12/2004   Hepatitis B 01/14/2004, 03/01/2004, 10/12/2004   Influenza Split 12/28/2014, 12/31/2019, 01/10/2021   Influenza Whole 12/27/2010   Influenza, High Dose Seasonal PF 12/19/2015, 01/07/2017, 01/05/2019   Influenza-Unspecified 12/25/2011, 12/09/2012, 01/11/2014   PFIZER Comirnaty(Gray Top)Covid-19 Tri-Sucrose Vaccine 07/22/2020   PFIZER(Purple Top)SARS-COV-2 Vaccination 04/04/2019, 04/24/2019, 01/23/2020   Pfizer Covid-19 Vaccine Bivalent Booster 82yrs & up 01/25/2021   Pneumococcal Conjugate-13 09/30/2015   Pneumococcal Polysaccharide-23 11/23/2016   Td 05/24/1997, 07/11/2007   Tdap 11/15/2017, 10/14/2020   Zoster Recombinat (Shingrix) 03/01/2017, 07/26/2017   Zoster, Live 12/26/2012    TDAP status: Up to date  Flu Vaccine status: Up to date  Pneumococcal vaccine status: Up to date  Covid-19 vaccine status: Completed vaccines  Qualifies for Shingles Vaccine? No   Zostavax completed Yes   Shingrix Completed?: Yes  Screening Tests Health Maintenance  Topic Date Due   MAMMOGRAM  01/14/2022   COLONOSCOPY (Pts 45-42yrs Insurance coverage will need to be confirmed)  02/01/2029   TETANUS/TDAP  10/15/2030  Pneumonia Vaccine 54+ Years old  Completed   INFLUENZA VACCINE  Completed   DEXA SCAN  Completed   COVID-19 Vaccine  Completed   Hepatitis C Screening  Completed   Zoster Vaccines- Shingrix  Completed   HPV VACCINES  Aged Out    Health Maintenance  There are no preventive care reminders to display for this patient.  Colorectal cancer screening: Completed colonoscopy 02/02/2019. Patient states she is to repeat sooner than 10 years but unsure of exact date. She plans to see Gi for recommendations.  Mammogram status: Patient states she will call to schedule.  Bone Density status: Completed  11/24/2020. Results reflect: Bone density results: OSTEOPENIA. Repeat every 2 years.  Lung Cancer Screening: (Low Dose CT Chest recommended if Age 60-80 years, 30 pack-year currently smoking OR have quit w/in 15years.) does not qualify.    Additional Screening:  Hepatitis C Screening: Completed 04/06/2016  Vision Screening: Recommended annual ophthalmology exams for early detection of glaucoma and other disorders of the eye. Is the patient up to date with their annual eye exam?  Yes  Who is the provider or what is the name of the office in which the patient attends annual eye exams? Dr. Ellie Lunch   Dental Screening: Recommended annual dental exams for proper oral hygiene  Community Resource Referral / Chronic Care Management: CRR required this visit?  No   CCM required this visit?  No      Plan:     I have personally reviewed and noted the following in the patient's chart:   Medical and social history Use of alcohol, tobacco or illicit drugs  Current medications and supplements including opioid prescriptions. Patient is not currently taking opioid prescriptions. Functional ability and status Nutritional status Physical activity Advanced directives List of other physicians Hospitalizations, surgeries, and ER visits in previous 12 months Vitals Screenings to include cognitive, depression, and falls Referrals and appointments  In addition, I have reviewed and discussed with patient certain preventive protocols, quality metrics, and best practice recommendations. A written personalized care plan for preventive services as well as general preventive health recommendations were provided to patient.   Due to this being a telephonic visit, the after visit summary with patients personalized plan was offered to patient via mail or my-chart.  Patient would like to access on my-chart.   Marta Antu, LPN   28/00/3491  Nurse Health Advisor  Nurse Notes: None

## 2021-02-21 NOTE — Patient Instructions (Signed)
[name] , Thank you for taking time to complete your Medicare Wellness Visit. I appreciate your ongoing commitment to your health goals. Please review the following plan we discussed and let me know if I can assist you in the future.   Screening recommendations/referrals: Colonoscopy: Per our conversation, you will contact GI for instructions on when to repeat colonoscopy. Mammogram: Per our conversation, you will call to schedule. Bone Density: Completed 11/24/2020-Due 11/25/2022. Recommended yearly ophthalmology/optometry visit for glaucoma screening and checkup Recommended yearly dental visit for hygiene and checkup  Vaccinations: Influenza vaccine: Up to date Pneumococcal vaccine: Up to date Tdap vaccine: Up to date Shingles vaccine: Completed vaccines   Covid-19:Up to date  Advanced directives: Copy in chart  Conditions/risks identified: See problem list  Next appointment: Follow up in one year for your annual wellness visit    Preventive Care 70 Years and Older, Female Preventive care refers to lifestyle choices and visits with your health care provider that can promote health and wellness. What does preventive care include? A yearly physical exam. This is also called an annual well check. Dental exams once or twice a year. Routine eye exams. Ask your health care provider how often you should have your eyes checked. Personal lifestyle choices, including: Daily care of your teeth and gums. Regular physical activity. Eating a healthy diet. Avoiding tobacco and drug use. Limiting alcohol use. Practicing safe sex. Taking low-dose aspirin every day. Taking vitamin and mineral supplements as recommended by your health care provider. What happens during an annual well check? The services and screenings done by your health care provider during your annual well check will depend on your age, overall health, lifestyle risk factors, and family history of disease. Counseling  Your  health care provider may ask you questions about your: Alcohol use. Tobacco use. Drug use. Emotional well-being. Home and relationship well-being. Sexual activity. Eating habits. History of falls. Memory and ability to understand (cognition). Work and work Statistician. Reproductive health. Screening  You may have the following tests or measurements: Height, weight, and BMI. Blood pressure. Lipid and cholesterol levels. These may be checked every 5 years, or more frequently if you are over 10 years old. Skin check. Lung cancer screening. You may have this screening every year starting at age 70 if you have a 30-pack-year history of smoking and currently smoke or have quit within the past 15 years. Fecal occult blood test (FOBT) of the stool. You may have this test every year starting at age 70. Flexible sigmoidoscopy or colonoscopy. You may have a sigmoidoscopy every 5 years or a colonoscopy every 10 years starting at age 71. Hepatitis C blood test. Hepatitis B blood test. Sexually transmitted disease (STD) testing. Diabetes screening. This is done by checking your blood sugar (glucose) after you have not eaten for a while (fasting). You may have this done every 1-3 years. Bone density scan. This is done to screen for osteoporosis. You may have this done starting at age 70. Mammogram. This may be done every 1-2 years. Talk to your health care provider about how often you should have regular mammograms. Talk with your health care provider about your test results, treatment options, and if necessary, the need for more tests. Vaccines  Your health care provider may recommend certain vaccines, such as: Influenza vaccine. This is recommended every year. Tetanus, diphtheria, and acellular pertussis (Tdap, Td) vaccine. You may need a Td booster every 10 years. Zoster vaccine. You may need this after age 70. Pneumococcal  13-valent conjugate (PCV13) vaccine. One dose is recommended after age  70. Pneumococcal polysaccharide (PPSV23) vaccine. One dose is recommended after age 70. Talk to your health care provider about which screenings and vaccines you need and how often you need them. This information is not intended to replace advice given to you by your health care provider. Make sure you discuss any questions you have with your health care provider. Document Released: 04/08/2015 Document Revised: 11/30/2015 Document Reviewed: 01/11/2015 Elsevier Interactive Patient Education  2017 [place] Prevention in the Home Falls can cause injuries. They can happen to people of all ages. There are many things you can do to make your home safe and to help prevent falls. What can I do on the outside of my home? Regularly fix the edges of walkways and driveways and fix any cracks. Remove anything that might make you trip as you walk through a door, such as a raised step or threshold. Trim any bushes or trees on the path to your home. Use bright outdoor lighting. Clear any walking paths of anything that might make someone trip, such as rocks or tools. Regularly check to see if handrails are loose or broken. Make sure that both sides of any steps have handrails. Any raised decks and porches should have guardrails on the edges. Have any leaves, snow, or ice cleared regularly. Use sand or salt on walking paths during winter. Clean up any spills in your garage right away. This includes oil or grease spills. What can I do in the bathroom? Use night lights. Install grab bars by the toilet and in the tub and shower. Do not use towel bars as grab bars. Use non-skid mats or decals in the tub or shower. If you need to sit down in the shower, use a plastic, non-slip stool. Keep the floor dry. Clean up any water that spills on the floor as soon as it happens. Remove soap buildup in the tub or shower regularly. Attach bath mats securely with double-sided non-slip rug tape. Do not have throw  rugs and other things on the floor that can make you trip. What can I do in the bedroom? Use night lights. Make sure that you have a light by your bed that is easy to reach. Do not use any sheets or blankets that are too big for your bed. They should not hang down onto the floor. Have a firm chair that has side arms. You can use this for support while you get dressed. Do not have throw rugs and other things on the floor that can make you trip. What can I do in the kitchen? Clean up any spills right away. Avoid walking on wet floors. Keep items that you use a lot in easy-to-reach places. If you need to reach something above you, use a strong step stool that has a grab bar. Keep electrical cords out of the way. Do not use floor polish or wax that makes floors slippery. If you must use wax, use non-skid floor wax. Do not have throw rugs and other things on the floor that can make you trip. What can I do with my stairs? Do not leave any items on the stairs. Make sure that there are handrails on both sides of the stairs and use them. Fix handrails that are broken or loose. Make sure that handrails are as long as the stairways. Check any carpeting to make sure that it is firmly attached to the stairs. Fix any carpet  that is loose or worn. Avoid having throw rugs at the top or bottom of the stairs. If you do have throw rugs, attach them to the floor with carpet tape. Make sure that you have a light switch at the top of the stairs and the bottom of the stairs. If you do not have them, ask someone to add them for you. What else can I do to help prevent falls? Wear shoes that: Do not have high heels. Have rubber bottoms. Are comfortable and fit you well. Are closed at the toe. Do not wear sandals. If you use a stepladder: Make sure that it is fully opened. Do not climb a closed stepladder. Make sure that both sides of the stepladder are locked into place. Ask someone to hold it for you, if  possible. Clearly mark and make sure that you can see: Any grab bars or handrails. First and last steps. Where the edge of each step is. Use tools that help you move around (mobility aids) if they are needed. These include: Canes. Walkers. Scooters. Crutches. Turn on the lights when you go into a dark area. Replace any light bulbs as soon as they burn out. Set up your furniture so you have a clear path. Avoid moving your furniture around. If any of your floors are uneven, fix them. If there are any pets around you, be aware of where they are. Review your medicines with your doctor. Some medicines can make you feel dizzy. This can increase your chance of falling. Ask your doctor what other things that you can do to help prevent falls. This information is not intended to replace advice given to you by your health care provider. Make sure you discuss any questions you have with your health care provider. Document Released: 01/06/2009 Document Revised: 08/18/2015 Document Reviewed: 04/16/2014 Elsevier Interactive Patient Education  2017 [employer].

## 2021-02-22 ENCOUNTER — Encounter: Payer: Self-pay | Admitting: Physical Therapy

## 2021-02-22 ENCOUNTER — Other Ambulatory Visit: Payer: Self-pay

## 2021-02-22 ENCOUNTER — Ambulatory Visit: Payer: Medicare Other | Attending: Family Medicine | Admitting: Physical Therapy

## 2021-02-22 DIAGNOSIS — R296 Repeated falls: Secondary | ICD-10-CM | POA: Diagnosis not present

## 2021-02-22 DIAGNOSIS — R2681 Unsteadiness on feet: Secondary | ICD-10-CM | POA: Insufficient documentation

## 2021-02-22 DIAGNOSIS — M25562 Pain in left knee: Secondary | ICD-10-CM | POA: Diagnosis not present

## 2021-02-22 DIAGNOSIS — R42 Dizziness and giddiness: Secondary | ICD-10-CM | POA: Insufficient documentation

## 2021-02-22 DIAGNOSIS — R293 Abnormal posture: Secondary | ICD-10-CM | POA: Diagnosis not present

## 2021-02-22 NOTE — Therapy (Signed)
[facility] [address]  [place], [place], [address] Phone: [phone]   Fax:  [phone]  Physical Therapy Evaluation  Patient Details  Name: Carrie Elliott MRN: [id number] Date of Birth: 1968/10/05 Referring Provider (PT): Penni Homans   Encounter Date: 02/22/2021   PT End of Session - 02/22/21 1002     Visit Number 1    Number of Visits 16    Date for PT Re-Evaluation 04/19/21    Authorization Type Medicare + supplement    Progress Note Due on Visit 10    PT Start Time 0850    PT Stop Time 0935    PT Time Calculation (min) 45 min    Activity Tolerance Patient tolerated treatment well;Other (comment)   nausea with BPPV testing and treatment   Behavior During Therapy [facility] for tasks assessed/performed             Past Medical History:  Diagnosis Date   Allergic rhinitis    Allergy    Anemia 08/24/2014   Arthritis    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, shoulders, knees   Benign paroxysmal positional vertigo 07/12/2013   Chicken pox    Cystocele    grade 2   Endometriosis    with Menometrorhaghia    Gastric polyps 09/30/2015   Gastroesophageal reflux disease with hiatal hernia    Cough Sees Dr Laretta Bolster    GERD (gastroesophageal reflux disease)    Hemorrhoids    History of colonic polyps    History of colonic polyps 09/30/2015   adenoma   HTN (hypertension) 04/23/2013   Hypocalcemia 09/30/2015   Hyponatremia 11/23/2016   IBS (irritable bowel syndrome)    Iron deficiency anemia 06/11/2019   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   Measles    Mumps    Muscle spasm 09/30/2015   Osteopenia 10/09/2015   Rectocele    grade 2   Reflux    cough   Rosacea    Sternal fracture    1986   Sun-damaged skin 09/05/2014   Vertigo 11/23/2016   Vitamin D deficiency 04/06/2016   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    Past Surgical History:  Procedure Laterality Date   APPENDECTOMY  1989   atypical nevus excision     BREAST BIOPSY Right     CHOLECYSTECTOMY  1989   COLONOSCOPY  2011   COLONOSCOPY WITH PROPOFOL N/A 05/27/2015   Procedure: COLONOSCOPY WITH PROPOFOL;  Surgeon: Clarene Essex, MD;  Location: [facility];  Service: Endoscopy;  Laterality: N/A;   CYSTOSCOPY W/ URETERAL STENT REMOVAL  1999   ESOPHAGOGASTRODUODENOSCOPY  2011   with esophageal stent, perforation    ESOPHAGOGASTRODUODENOSCOPY (EGD) WITH PROPOFOL N/A 05/27/2015   Procedure: ESOPHAGOGASTRODUODENOSCOPY (EGD) WITH PROPOFOL;  Surgeon: Clarene Essex, MD;  Location: [facility];  Service: Endoscopy;  Laterality: N/A;   HEMORRHOID SURGERY  2005   LEFT OOPHORECTOMY  0045   complicated by ureteral injuery requiring a ureteral stent    MMK / ANTERIOR VESICOURETHROPEXY / URETHROPEXY  2000   TONSILLECTOMY AND ADENOIDECTOMY     TOTAL ABDOMINAL HYSTERECTOMY  2000   TUBAL LIGATION  1981   wisdom teeth removal  1964    There were no vitals filed for this visit.    Subjective Assessment - 02/22/21 0857     Subjective Patient reports that she has had BPPV several years, she had treatment in the past but forgot how to deal with it on her own.  She had L knee pain  seen by orthopedist who gave her a shot and told her she had arthritis in it but not bone on bone yet.  She was doing ok, but then had two recent falls, the first one on 10/14/20 a paver turned under foot and she fell and broke her R 5th MTC in hand.  She has just finished hand therapy for this injury.  She fell again after slipping, was wearing wrist brace so didn't injure hand that time.  She hit her face both times and broke glasses on second fall.  She also reports she has difficulty looking up as this also causes dizziness.  Reports history of R hip pain as well.    Pertinent History history of falls with injury    Limitations Standing;Walking;House hold activities;Other (comment)   shopping, stairs   How long can you walk comfortably? 10-15 minutes    Diagnostic tests 10/14/20- L knee Xrays 1. No fracture or  dislocation of the right knee.  2. Mild tricompartmental osteoarthritis.  10/14/20- cervical/head CT- Moderate multilevel degenerative disc disease is noted in the  cervical spine. No acute abnormality is noted.    Patient Stated Goals feel more steady    Currently in Pain? Yes    Pain Score 0-No pain   4-5/10 with stair climbing   Pain Location Knee    Pain Orientation Left    Pain Descriptors / Indicators Aching    Pain Type Chronic pain    Pain Onset Other (comment)    Aggravating Factors  kneeling, climbing stairs, walking too much ([employer] or [employer])    Pain Relieving Factors rest or tylenol    Multiple Pain Sites Yes    Pain Score 0   7-8/10 with climbing stairs   Pain Location Hip    Pain Orientation Right    Pain Descriptors / Indicators Aching    Pain Type Chronic pain    Pain Onset Other (comment)    Aggravating Factors  climbing stairs    Pain Relieving Factors massage ball                [facility] PT Assessment - 02/22/21 0001       Assessment   Medical Diagnosis M25.562 (ICD-10-CM) - Left knee pain, unspecified chronicity  R26.81 (ICD-10-CM) - Unsteady gait  R42 (ICD-10-CM) - Disequilibrium  R29.6 (ICD-10-CM) - Recurrent falls    Referring Provider (PT) Penni Homans    Onset Date/Surgical Date --   chronic knee pain + 6 months, but worsened with recent falls   Prior Therapy yes, just released from hand therapy for R 5th MTC fracture      Precautions   Precautions None      Restrictions   Weight Bearing Restrictions No      Balance Screen   Has the patient fallen in the past 6 months Yes    How many times? 2    Has the patient had a decrease in activity level because of a fear of falling?  Yes    Is the patient reluctant to leave their home because of a fear of falling?  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    Leisure read, Training and development officer, bible study      Cognition   Overall Cognitive Status Within Functional Limits for tasks assessed      Observation/Other Assessments   Focus on Therapeutic Outcomes (FOTO)  knee 60 (risk adjusted 49)   64 predicted after 12 visits   Other Surveys  Activities of Balance and Confidence Scale    Activities of Balance Confidence Scale (ABC Scale)  81%      ROM / Strength   AROM / PROM / Strength AROM;Strength      AROM   Overall AROM  Deficits    Overall AROM Comments tested in sitting, no pain just tightness    AROM Assessment Site Cervical    Cervical Flexion 40    Cervical Extension 35    Cervical - Right Rotation 60    Cervical - Left Rotation 50      Strength   Overall Strength Within functional limits for tasks performed    Overall Strength Comments tested in sitting    Strength Assessment Site Hip;Knee;Ankle    Right/Left Hip Right;Left    Right Hip Flexion 4+/5    Right Hip ABduction 5/5    Right Hip ADduction 5/5    Left Hip Flexion 4+/5    Left Hip ABduction 5/5    Left Hip ADduction 5/5    Right/Left Knee Right;Left    Right Knee Flexion 5/5    Right Knee Extension 5/5    Left Knee Flexion 5/5    Right/Left Ankle Right;Left    Right Ankle Dorsiflexion 5/5    Left Ankle Dorsiflexion 5/5      Palpation   Patella mobility WNL    Palpation comment tenderness over L MCL/pes anserine                    Vestibular Assessment - 02/22/21 0001       Vestibular Assessment   General Observation enters independent in no apparent distress.      Symptom Behavior   Subjective history of current problem BPPV for years, treated in past    Type of Dizziness  Spinning;Unsteady  with head/body turns;"Funny feeling in head"    Frequency of Dizziness BPPV several times, or anytime looking up for any length of time    Duration of Dizziness brief    Symptom Nature Motion provoked;Positional    Aggravating Factors Looking up to the ceiling;Rolling to right;Sitting with head tilted back    Relieving Factors Head stationary    Progression of Symptoms No change since onset    History of similar episodes yes, treated for BPPV in the past      Oculomotor Exam   Oculomotor Alignment Normal    Ocular ROM WFL although decreased convergence    Spontaneous Absent    Gaze-induced  Absent    Head shaking Horizontal Absent    Head Shaking Vertical Absent    Smooth Pursuits Saccades    Saccades Poor trajectory      Positional Testing   Dix-Hallpike Dix-Hallpike Right;Dix-Hallpike Left      Dix-Hallpike Right   Dix-Hallpike Right Duration 30 sec    Dix-Hallpike Right Symptoms No nystagmus;Other (comment)   reported feeling dizzy in position     Dix-Hallpike Left   Dix-Hallpike Left Duration 25 sec    Dix-Hallpike Left Symptoms  Upbeat, left rotatory nystagmus      Positional Sensitivities   Sit to Supine No dizziness    Supine to Left Side Severe dizziness    Supine to Right Side Mild dizziness    Head Turning x 5 No dizziness    Head Nodding x 5 No dizziness                Objective measurements completed on examination: See above findings.        Vestibular Treatment/Exercise - 02/22/21 0001       Vestibular Treatment/Exercise   Vestibular Treatment Provided Canalith Repositioning    Canalith Repositioning Epley Manuever Left       EPLEY MANUEVER LEFT   Number of Reps  1    Overall Response  Improved Symptoms     RESPONSE DETAILS LEFT dizzy and nauseous during procedure, given water and allowed to sit afterwards, not repeated today due to time constraints                    PT Education - 02/22/21 1519     Education Details educated  on POC and findings.  Educated to always contact PCP and request referral to PT if have signs of BPPV again after PT episode is done.  Educated to avoid large movements like bending over for the rest of the day, but otherwise no restrictions.    Person(s) Educated Patient    Methods Explanation    Comprehension Verbalized understanding              PT Short Term Goals - 02/22/21 1312       PT SHORT TERM GOAL #1   Title Pt. will be evaluated for balance.    Time 2    Period Weeks    Status New    Target Date 03/08/21               PT Long Term Goals - 02/22/21 1312       PT LONG TERM GOAL #1   Title Patient to be independent with advanced HEP to improve outcomes    Time 8    Period Weeks    Status New    Target Date 04/19/21      PT LONG TERM GOAL #2   Title Patient will demonstrate resolution of BPPV symptoms.    Time 8    Period Weeks    Status New    Target Date 04/19/21      PT LONG TERM GOAL #3   Title Patient will be able to ascend/descend stairs without increased L knee pain.    Time 8    Period Weeks    Status New    Target Date 04/19/21      PT LONG TERM GOAL #4   Title Patient will demonstrate improved cervical ROM by 10 deg all directions to improve functional mobility.    Baseline see flowsheet    Time 8    Period Weeks    Status New    Target Date 04/19/21      PT LONG TERM GOAL #5   Title Patient will be able to look upwards without limitation from dizziness to shop.    Baseline dizziness with prolonged neck extension.    Time 8    Period Weeks    Status New    Target Date 04/19/21  Plan - 02/22/21 1004     Clinical Impression Statement Corrinna Karapetyan is a 70 year old female referred after 2 recent falls, both of which she hit her head and knees, aggravating chronic L knee pain, just released from hand therapy following fracture of R hand.  She also reports history of BPPV and feeling disequilibrium with  looking up for any period, but this did not cause the falls.  She demonstrates tenderness over L MCL/pes anserine, increased L knee/R hip pain with activities like stair climing and prolonged walking, and bil hip flexor weakness.  She also demonstrated L posterior canalithiasis, and possible right side involvement as well as well, and was treated on L side with Epley manuever today.  She demonstrates foward head posture, decreased cervical ROM, and poor gaze stabilization with horizontal eye movements.  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    Personal Factors and Comorbidities Comorbidity 3+    Comorbidities falls with injury, vertigo, BPPV, osteoarthritis, HTN, chronic L knee pain    Examination-Activity Limitations Bed Mobility;Transfers;Locomotion Level;Stairs;Reach Overhead    Examination-Participation Restrictions Cleaning;Shop;Community Activity;Occupation    Stability/Clinical Decision Making Evolving/Moderate complexity    Clinical Decision Making Moderate    Rehab Potential Good    PT Frequency 2x / week    PT Duration 8 weeks    PT Treatment/Interventions ADLs/Self Care Home Management;Cryotherapy;Electrical Stimulation;Moist Heat;Iontophoresis 4mg /ml Dexamethasone;Ultrasound;Gait training;Stair training;Functional mobility training;Therapeutic activities;Therapeutic exercise;Balance training;Neuromuscular re-education;Patient/family education;Manual techniques;Passive range of motion;Dry needling;Taping;Vestibular;Spinal Manipulations;Joint Manipulations    PT Next Visit Plan reassess BPPV, VOR exercises, manual therapy for cervical.  Check balance - FGA    Consulted and Agree with Plan of Care Patient             Patient will benefit from skilled therapeutic  intervention in order to improve the following deficits and impairments:  Decreased balance, Decreased endurance, Decreased mobility, Difficulty walking, Increased muscle spasms, Decreased range of motion, Dizziness, Impaired perceived functional ability, Decreased activity tolerance, Decreased safety awareness, Decreased strength, Impaired flexibility, Postural dysfunction, Pain  Visit Diagnosis: Disequilibrium - Plan: Ambulatory referral to Physical Therapy  Left knee pain, unspecified chronicity - Plan: Ambulatory referral to Physical Therapy  Unsteady gait - Plan: Ambulatory referral to Physical Therapy  Recurrent falls - Plan: Ambulatory referral to Physical Therapy  Abnormal posture     Problem List Patient Active Problem List   Diagnosis Date Noted   Left knee pain 01/22/2021   Recurrent falls 01/22/2021   Cystocele, unspecified (CODE) 10/16/2020   Nausea 10/16/2020   Insomnia 10/16/2020   Eyelid abnormality 10/16/2020   Diverticulosis 12/09/2019   Iron deficiency anemia 06/11/2019   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   Trigger finger, right 10/12/2018   Sleep apnea 09/04/2018   UTI (urinary tract infection) 07/20/2018   Right lower quadrant abdominal pain 05/23/2018   Vitamin B12 deficiency 02/18/2018   Hyperglycemia 09/15/2017   Bruit of right carotid artery 05/26/2017   Hyponatremia 11/23/2016   Vertigo 11/23/2016   Hyperlipidemia 11/23/2016   Vitamin D deficiency 04/06/2016   Osteopenia 10/09/2015   Hypocalcemia 09/30/2015   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   History of colonic polyps 09/30/2015   Gastric polyps 09/30/2015   Hip pain 09/05/2014   Urinary frequency 09/05/2014   Preventative health care 09/05/2014   Sun-damaged skin 09/05/2014   Anemia 08/24/2014   Cervical cancer screening 08/24/2014  Myalgia and myositis 02/11/2014   Benign paroxysmal positional vertigo 07/12/2013   History of chicken pox    Chest pain 04/23/2013   HTN (hypertension) 04/23/2013    Headache 04/23/2013   Nodule of left lung 03/30/2011   Allergic rhinitis    Arthritis    Gastroesophageal reflux disease with hiatal hernia    IBS (irritable bowel syndrome)     Rennie Natter, PT, DPT 02/22/2021, 3:21 PM  [facility] [address]  [place], [place], [address] Phone: [phone]   Fax:  [phone]  Name: RIYAN GAVINA MRN: [id number] Date of Birth: 03/31/1950

## 2021-02-23 ENCOUNTER — Encounter (HOSPITAL_BASED_OUTPATIENT_CLINIC_OR_DEPARTMENT_OTHER): Payer: Self-pay | Admitting: Obstetrics & Gynecology

## 2021-02-23 ENCOUNTER — Ambulatory Visit (INDEPENDENT_AMBULATORY_CARE_PROVIDER_SITE_OTHER): Payer: Medicare Other | Admitting: Obstetrics & Gynecology

## 2021-02-23 VITALS — BP 135/67 | HR 71 | Ht 60.5 in | Wt 167.4 lb

## 2021-02-23 DIAGNOSIS — N8111 Cystocele, midline: Secondary | ICD-10-CM | POA: Diagnosis not present

## 2021-02-23 DIAGNOSIS — R3915 Urgency of urination: Secondary | ICD-10-CM

## 2021-02-23 DIAGNOSIS — Z90710 Acquired absence of both cervix and uterus: Secondary | ICD-10-CM

## 2021-02-23 NOTE — Progress Notes (Signed)
GYNECOLOGY  VISIT  CC:   prolapse  HPI: 70 y.o.  Married White or Caucasian female here for complaint of prolapse that she's felt off and on for many years.  Reports this summer when she was digging in the garden, she had a lot more issues .  At times she could feel the prolapse outside of the vagina.  Would have to go lie down on her bed for it to retract.  Now that garden season is over, symptoms are better.  But she thinks it will worsen again in the summer so wants to have this evaluated.    H/o TAH/USO, MMK procedure.  Feels the MMK never really helped.    She does have some mild leakage.  She feels this more when she holds her bladder too long.  She does not feel like she completely empties her bladder.  She double voids and this helps.  She does get up once or twice at night.  She does have a good void when she gets up in the middle of the night.  She doesn't leak much with cough or sneeze.  Denies GI issues.  Denies vaginal bleeding or discharge.    GYNECOLOGIC HISTORY: No LMP recorded. Patient has had a hysterectomy.  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   History of colonic polyps 09/30/2015   Gastric polyps 09/30/2015   Hip pain 09/05/2014   Urinary frequency 09/05/2014   Preventative health care 09/05/2014   Sun-damaged skin 09/05/2014   Myalgia and myositis 02/11/2014   Benign paroxysmal positional vertigo 07/12/2013   HTN (hypertension) 04/23/2013   Nodule of left lung 03/30/2011   Allergic rhinitis    Arthritis     Gastroesophageal reflux disease with hiatal hernia     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   Osteopenia 10/09/2015   Rectocele    grade 2   Sternal fracture    1986   Vitamin D deficiency 04/06/2016   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    Past Surgical History:  Procedure Laterality Date   APPENDECTOMY  1989   atypical nevus excision     BREAST BIOPSY Right    CHOLECYSTECTOMY  1989   COLONOSCOPY WITH PROPOFOL N/A 05/27/2015   Procedure: COLONOSCOPY WITH PROPOFOL;  Surgeon: Clarene Essex, MD;  Location: [facility];  Service: Endoscopy;  Laterality: N/A;   CYSTOSCOPY W/ URETERAL STENT REMOVAL  1999   ESOPHAGOGASTRODUODENOSCOPY  2011   with esophageal stent, perforation    ESOPHAGOGASTRODUODENOSCOPY (EGD) WITH PROPOFOL N/A 05/27/2015   Procedure: ESOPHAGOGASTRODUODENOSCOPY (EGD) WITH PROPOFOL;  Surgeon: Clarene Essex, MD;  Location: [facility];  Service: Endoscopy;  Laterality: N/A;   HEMORRHOID SURGERY  2005   LEFT OOPHORECTOMY  0102   complicated by ureteral injuery requiring a ureteral stent    MMK / ANTERIOR VESICOURETHROPEXY / URETHROPEXY  2000   TONSILLECTOMY AND ADENOIDECTOMY     TOTAL ABDOMINAL HYSTERECTOMY  2000   with USO   TUBAL LIGATION  1981   wisdom teeth removal  1964    MEDS:   Current Outpatient Medications on File Prior to Visit  Medication Sig Dispense Refill   benzocaine (ORAJEL) 10 % mucosal gel Use as directed 1 application in the mouth or throat as needed for mouth pain. Reported on 07/22/2015     Cholecalciferol (VITAMIN D3) 50 MCG (2000 UT) TABS Take by mouth. Take 1 tablet daily  Monday-Friday. Take 2 tablets on Saturday.     COVID-19 mRNA bivalent vaccine, Pfizer, (PFIZER COVID-19 VAC BIVALENT) injection Inject into the muscle. 0.3 mL 0   COVID-19 mRNA Vac-TriS, Pfizer, (PFIZER-BIONT COVID-19 VAC-TRIS) SUSP injection Inject into the muscle. 0.3 mL 0   dicyclomine (BENTYL) 10 MG capsule Take 1-2 capsules by mouth 4 (four) times daily as needed.     esomeprazole (NEXIUM) 40 MG capsule TAKE 1 CAPSULE BY MOUTH ONCE A DAY 90 capsule 3   fexofenadine (ALLEGRA) 180 MG tablet Take 1 tablet (180 mg total) by mouth daily as needed for allergies or rhinitis. 30 tablet 5   fluticasone (FLONASE) 50 MCG/ACT nasal spray Place 2 sprays into both nostrils daily. 16 g 6   hydrochlorothiazide (MICROZIDE) 12.5 MG capsule TAKE 1 CAPSULE (12.5 MG TOTAL) BY MOUTH DAILY. TAKE IF > 110/70 90 capsule 1   Lidocaine-Hydrocortisone Ace 3-0.5 % CREA Apply 1 application topically daily as needed (hemmoriods.).      lisinopril (ZESTRIL) 10 MG tablet Take 1 tablet (10 mg total) by mouth 2 (two) times daily. 180 tablet 1   metoprolol succinate (TOPROL-XL) 50 MG 24 hr tablet Take 1 tablet (50 mg total) by mouth in the morning and at bedtime. 180 tablet 1   ondansetron (ZOFRAN) 8 MG tablet Take 1 tablet (8 mg total) by mouth every 8 (eight) hours as needed for nausea or vomiting. 90 tablet 1   SIMPLY SALINE NA Place 1 each into the nose daily.      famotidine (PEPCID) 20 MG tablet TAKE 1 TABLET BY MOUTH AT BEDTIME AS NEEDED TWICE A DAY 60 tablet 3   No current facility-administered medications on file prior to visit.    ALLERGIES: Pseudoephedrine hcl er, Singulair [montelukast sodium], Cefdinir, Celebrex [celecoxib], Ciprofloxacin, and Codeine  Family History  Problem Relation Age of Onset   Colon polyps Father    Hypertension Father    Heart failure Father    Melanoma Father        multiple   Heart attack Father        x2   Heart disease Father        Bundle Block   Meniere's disease Father     Cancer Father        melanoma   Osteoporosis Mother    Transient ischemic attack Mother    Cancer Mother        colon   Heart disease Mother        afib   Hyperlipidemia Mother    Diabetes Paternal Grandmother    Stroke Maternal Grandmother    Cancer Maternal Grandmother        skin, melanoma, sarcoma   Colon polyps Maternal  Grandmother    Diverticulitis Maternal Grandmother    Stroke Maternal Grandfather    Heart disease Maternal Grandfather    Obesity Brother     SH:  married, non smoker  Review of Systems  Gastrointestinal: Negative.   Genitourinary:  Positive for urgency.   PHYSICAL EXAMINATION:    BP 135/67 (BP Location: Left Arm, Patient Position: Sitting, Cuff Size: Large)   Pulse 71   Ht 5' 0.5" (1.537 m) Comment: reported  Wt 167 lb 6.4 oz (75.9 kg)   BMI 32.15 kg/m     General appearance: alert, cooperative and appears stated age n-tender; bowel sounds normal; no masses,  no organomegaly Lymph:  no inguinal LAD noted  Pelvic: External genitalia:  no lesions              Urethra:  normal appearing urethra with no masses, tenderness or lesions              Bartholins and Skenes: normal                 Vagina: atrophic tissue, no lesions, 3rd degree cystocele with valsalva, posterior support is good, apex support appears adequate as well              Cervix: absent              Bimanual Exam:  Uterus:  uterus absent              Adnexa: no mass, fullness, tenderness              Anus:  normal sphincter tone, no lesions  Chaperone, Octaviano Batty, CMA, was present for exam.  Assessment/Plan: 1. Cystocele, midline - feel pt likely just needs anterior repair.  She is not interested in having mesh used.  We did discuss pessary use.  She just wants this fixed.  D/w pt probably vaginal estrogen use prior to surgery.  Will refer to Dr. Wannetta Sender for this. - Ambulatory referral to Urogynecology  2. Urinary urgency - pt aware will likely need urodynamics prior to  surgery  3. H/O abdominal hysterectomy

## 2021-02-28 ENCOUNTER — Other Ambulatory Visit: Payer: Self-pay

## 2021-02-28 ENCOUNTER — Encounter: Payer: Self-pay | Admitting: Physical Therapy

## 2021-02-28 ENCOUNTER — Ambulatory Visit: Payer: Medicare Other | Attending: Family Medicine | Admitting: Physical Therapy

## 2021-02-28 DIAGNOSIS — M25562 Pain in left knee: Secondary | ICD-10-CM | POA: Diagnosis not present

## 2021-02-28 DIAGNOSIS — R296 Repeated falls: Secondary | ICD-10-CM | POA: Insufficient documentation

## 2021-02-28 DIAGNOSIS — R42 Dizziness and giddiness: Secondary | ICD-10-CM | POA: Insufficient documentation

## 2021-02-28 DIAGNOSIS — R2681 Unsteadiness on feet: Secondary | ICD-10-CM | POA: Diagnosis not present

## 2021-02-28 DIAGNOSIS — R293 Abnormal posture: Secondary | ICD-10-CM | POA: Diagnosis not present

## 2021-02-28 NOTE — Therapy (Signed)
[facility] [address]  [place], [place], [address] Phone: [phone]   Fax:  [phone]  Physical Therapy Treatment  Patient Details  Name: Carrie Elliott MRN: [id number] Date of Birth: 09-12-1950 Referring Provider (PT): Penni Homans   Encounter Date: 02/28/2021   PT End of Session - 02/28/21 1230     Visit Number 2    Number of Visits 16    Date for PT Re-Evaluation 04/19/21    Authorization Type Medicare + supplement    Progress Note Due on Visit 10    PT Start Time 0933    PT Stop Time 0814    PT Time Calculation (min) 41 min    Activity Tolerance Patient tolerated treatment well   nausea with BPPV testing and treatment   Behavior During Therapy [facility] for tasks assessed/performed             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    Past Surgical History:  Procedure Laterality Date   APPENDECTOMY  1989   atypical nevus excision     BREAST BIOPSY Right    CHOLECYSTECTOMY  1989   COLONOSCOPY WITH PROPOFOL N/A 05/27/2015   Procedure: COLONOSCOPY WITH PROPOFOL;  Surgeon: Clarene Essex, MD;  Location: [facility];  Service: Endoscopy;  Laterality: N/A;   CYSTOSCOPY W/ URETERAL STENT REMOVAL  1999   ESOPHAGOGASTRODUODENOSCOPY  2011   with esophageal stent, perforation    ESOPHAGOGASTRODUODENOSCOPY (EGD) WITH PROPOFOL N/A 05/27/2015   Procedure: ESOPHAGOGASTRODUODENOSCOPY (EGD) WITH PROPOFOL;  Surgeon: Clarene Essex, MD;  Location: [facility];  Service: Endoscopy;  Laterality: N/A;   HEMORRHOID SURGERY  2005   LEFT OOPHORECTOMY  4818   complicated by ureteral injuery requiring a ureteral stent    MMK / ANTERIOR VESICOURETHROPEXY / URETHROPEXY  2000   TONSILLECTOMY AND ADENOIDECTOMY     TOTAL ABDOMINAL HYSTERECTOMY  2000   with USO   TUBAL LIGATION  1981   wisdom teeth removal  1964    There were no vitals filed for this visit.   Subjective Assessment - 02/28/21 0934     Subjective Patient reports no knee pain today, but yesterday it felt like she had a tight band around her knee.  She reports that she felt dizzy for several days after the canalith repositioning, however since that time she has not had any dizzyness rolling over in bed.    Pertinent History history of falls with injury    Limitations Standing;Walking;House hold activities;Other (comment)   shopping, stairs   How long can you walk comfortably? 10-15 minutes    Diagnostic tests 10/14/20- L knee Xrays 1. No fracture or dislocation of the right knee.  2. Mild tricompartmental osteoarthritis.  10/14/20- cervical/head CT- Moderate multilevel degenerative disc disease is noted in the  cervical spine. No acute abnormality is noted.    Patient Stated Goals feel more steady  Currently in Pain? Yes    Pain Onset Other (comment)    Pain Onset Other (comment)                [facility] PT Assessment - 02/28/21 0001       Standardized Balance Assessment   Standardized Balance Assessment Vestibular Evaluation    Balance Master Testing Other/comments   mCTSIB - condition 1 30 sec, condition 2 30 sec increased sawy, condition 3 30 sec increased sway, condition 4 -12 seconds     Functional Gait  Assessment   Gait assessed  Yes    Gait Level Surface Walks 20 ft in less than 5.5 sec, no assistive devices, good speed, no evidence for imbalance, normal gait pattern, deviates no more than 6 in outside of the 12 in walkway width.    Change  in Gait Speed Able to smoothly change walking speed without loss of balance or gait deviation. Deviate no more than 6 in outside of the 12 in walkway width.    Gait with Horizontal Head Turns Performs head turns smoothly with no change in gait. Deviates no more than 6 in outside 12 in walkway width    Gait with Vertical Head Turns Performs head turns with no change in gait. Deviates no more than 6 in outside 12 in walkway width.    Gait and Pivot Turn Pivot turns safely within 3 sec and stops quickly with no loss of balance.    Step Over Obstacle Is able to step over 2 stacked shoe boxes taped together (9 in total height) without changing gait speed. No evidence of imbalance.    Gait with Narrow Base of Support Ambulates less than 4 steps heel to toe or cannot perform without assistance.    Gait with Eyes Closed Walks 20 ft, slow speed, abnormal gait pattern, evidence for imbalance, deviates 10-15 in outside 12 in walkway width. Requires more than 9 sec to ambulate 20 ft.    Ambulating Backwards Walks 20 ft, slow speed, abnormal gait pattern, evidence for imbalance, deviates 10-15 in outside 12 in walkway width.    Steps Alternating feet, must use rail.    Total Score 22    FGA comment: < or = 22/30 predictive of falls                 Vestibular Assessment - 02/28/21 0001       Dix-Hallpike Right   Dix-Hallpike Right Duration 0    Dix-Hallpike Right Symptoms No nystagmus      Dix-Hallpike Left   Dix-Hallpike Left Duration 0    Dix-Hallpike Left Symptoms No nystagmus                       Vestibular Treatment/Exercise - 02/28/21 0001       Vestibular Treatment/Exercise   Vestibular Treatment Provided Habituation;Gaze    Habituation Exercises Standing Horizontal Head Turns;Standing Vertical Head Turns    Gaze Exercises X1 Viewing Vertical;X1 Viewing Horizontal      Standing Horizontal Head Turns   Number of Reps  10   In corner for safety, with eyes open x 30  sec, vertical head nods x 10, horizontal head turns x 10.  Repeated with eyes closed.   Symptom Description  no dizziness      Standing Vertical Head Turns   Number of Reps  10   In corner for safety, with eyes open x 30 sec, vertical head nods x 10, horizontal head turns  x 10.  Repeated with eyes closed.   Symptom Description  no dizziness, noted frequent saccades      X1 Viewing Horizontal   Foot Position seated    Time --   3 x 30 sec   Comments no dizziness, some saccades      X1 Viewing Vertical   Foot Position 10                      PT Short Term Goals - 02/28/21 1234       PT SHORT TERM GOAL #1   Title Pt. will be evaluated for balance.    Time 2    Period Weeks    Status Achieved   02/28/21 - completed FGA and mCTSIB   Target Date 03/08/21               PT Long Term Goals - 02/28/21 1235       PT LONG TERM GOAL #1   Title Patient to be independent with advanced HEP to improve outcomes    Time 8    Period Weeks    Status On-going    Target Date 04/19/21      PT LONG TERM GOAL #2   Title Patient will demonstrate resolution of BPPV symptoms.    Time 8    Period Weeks    Status Achieved   02/28/21- negative dix-hallpike bil   Target Date 04/19/21      PT LONG TERM GOAL #3   Title Patient will be able to ascend/descend stairs without increased L knee pain.    Time 8    Period Weeks    Status On-going   02/28/21- no knee pain with stairs today   Target Date 04/19/21      PT LONG TERM GOAL #4   Title Patient will demonstrate improved cervical ROM by 10 deg all directions to improve functional mobility.    Baseline see flowsheet    Time 8    Period Weeks    Status On-going    Target Date 04/19/21      PT LONG TERM GOAL #5   Title Patient will be able to look upwards without limitation from dizziness to shop.    Baseline dizziness with prolonged neck extension.    Time 8    Period Weeks    Status On-going    Target Date 04/19/21                    Plan - 02/28/21 1231     Clinical Impression Statement Denetta reported several days of discomfort following canalith repositioning for BPPV, but when rechecked today both canals were clear and no nystagmus.  She participated in further balance testing today, her score on FGA was 22/30 which indicates higher risk for falls, and she was challenged with tandem gait, retro stepping and walking with eyes closed.  On mCTSIB condition 4 (eyes closed on compliant surface) she was only able to maintain for 12 seconds before LOB, indicating vestibular dysfunction.  Today she was given standing balance exercises and VOR x 1 exercises to improve vestibular functioning.  She would benefit from continued skilled therapy.    Personal Factors and Comorbidities Comorbidity 3+    Comorbidities falls with injury, vertigo, BPPV, osteoarthritis, HTN, chronic L knee pain    Examination-Activity Limitations Bed Mobility;Transfers;Locomotion Level;Stairs;Reach Overhead    Examination-Participation Restrictions Cleaning;Shop;Community Activity;Occupation    Stability/Clinical Decision Making Evolving/Moderate complexity  Rehab Potential Good    PT Frequency 2x / week    PT Duration 8 weeks    PT Treatment/Interventions ADLs/Self Care Home Management;Cryotherapy;Electrical Stimulation;Moist Heat;Iontophoresis 4mg /ml Dexamethasone;Ultrasound;Gait training;Stair training;Functional mobility training;Therapeutic activities;Therapeutic exercise;Balance training;Neuromuscular re-education;Patient/family education;Manual techniques;Passive range of motion;Dry needling;Taping;Vestibular;Spinal Manipulations;Joint Manipulations    PT Next Visit Plan hip strengthening, tandem stance, manual therapy for cervical.    PT Home Exercise Plan Access Code: [email or web address]    Consulted and Agree with Plan of Care Patient             Patient will benefit from skilled therapeutic intervention in order to improve the  following deficits and impairments:  Decreased balance, Decreased endurance, Decreased mobility, Difficulty walking, Increased muscle spasms, Decreased range of motion, Dizziness, Impaired perceived functional ability, Decreased activity tolerance, Decreased safety awareness, Decreased strength, Impaired flexibility, Postural dysfunction, Pain  Visit Diagnosis: Disequilibrium  Left knee pain, unspecified chronicity  Recurrent falls  Unsteady gait  Abnormal posture     Problem List 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  [place], [place], [address] Phone: [phone]   Fax:  [phone]  Name: Carrie Elliott MRN: [id number] Date of Birth: [birth year]

## 2021-02-28 NOTE — Patient Instructions (Signed)
Access Code: [email or web address] URL: [email or web address] Date: 02/28/2021 Prepared by: Glenetta Hew  Exercises Seated Gaze Stabilization with Head Rotation - 3 x daily - 7 x weekly - 1 sets - 10 reps Seated Gaze Stabilization with Head Nod - 3 x daily - 7 x weekly - 1 sets - 10 reps Standing Balance in Corner - 3 x daily - 7 x weekly - 1 sets - 10 reps Standing Balance in Corner with Eyes Closed - 3 x daily - 7 x weekly - 1 sets - 10 reps

## 2021-03-03 ENCOUNTER — Encounter: Payer: Self-pay | Admitting: Physical Therapy

## 2021-03-03 ENCOUNTER — Ambulatory Visit: Payer: Medicare Other | Admitting: Physical Therapy

## 2021-03-03 ENCOUNTER — Other Ambulatory Visit: Payer: Self-pay

## 2021-03-03 DIAGNOSIS — R296 Repeated falls: Secondary | ICD-10-CM | POA: Diagnosis not present

## 2021-03-03 DIAGNOSIS — R293 Abnormal posture: Secondary | ICD-10-CM

## 2021-03-03 DIAGNOSIS — R42 Dizziness and giddiness: Secondary | ICD-10-CM | POA: Diagnosis not present

## 2021-03-03 DIAGNOSIS — M25562 Pain in left knee: Secondary | ICD-10-CM | POA: Diagnosis not present

## 2021-03-03 DIAGNOSIS — R2681 Unsteadiness on feet: Secondary | ICD-10-CM | POA: Diagnosis not present

## 2021-03-03 NOTE — Patient Instructions (Signed)
Access Code: [email or web address] URL: [email or web address] Date: 03/03/2021 Prepared by: Cockrell Hill with Counter Support - 1 x daily - 7 x weekly - 2 sets - 10 reps Standing Hip Extension with Counter Support - 1 x daily - 7 x weekly - 2 sets - 10 reps Standing Hip Abduction with Counter Support - 1 x daily - 7 x weekly - 2 sets - 10 reps Mini Squat with Counter Support - 1 x daily - 7 x weekly - 2 sets - 10 reps Shoulder External Rotation and Scapular Retraction with Resistance - 1 x daily - 7 x weekly - 2 sets - 10 reps Shoulder extension with resistance - Neutral - 1 x daily - 7 x weekly - 2 sets - 10 reps Scapular Retraction with Resistance - 1 x daily - 7 x weekly - 2 sets - 10 reps

## 2021-03-03 NOTE — Therapy (Signed)
[facility] [address]  [place], [place], [address] Phone: [phone]   Fax:  [phone]  Physical Therapy Treatment  Patient Details  Name: Carrie Elliott MRN: [id number] Date of Birth: January 28, 1951 Referring Provider (PT): Penni Homans   Encounter Date: 03/03/2021   PT End of Session - 03/03/21 0935     Visit Number 3    Number of Visits 16    Date for PT Re-Evaluation 04/19/21    Authorization Type Medicare + supplement    Progress Note Due on Visit 10    PT Start Time 0930    PT Stop Time 6440    PT Time Calculation (min) 45 min    Activity Tolerance Patient tolerated treatment well   nausea with BPPV testing and treatment   Behavior During Therapy [facility] for tasks assessed/performed             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    Past Surgical History:  Procedure Laterality Date   APPENDECTOMY  1989   atypical nevus excision     BREAST BIOPSY Right    CHOLECYSTECTOMY  1989   COLONOSCOPY WITH PROPOFOL N/A 05/27/2015   Procedure: COLONOSCOPY WITH PROPOFOL;  Surgeon: Clarene Essex, MD;  Location: [facility];  Service: Endoscopy;  Laterality: N/A;   CYSTOSCOPY W/ URETERAL STENT REMOVAL  1999   ESOPHAGOGASTRODUODENOSCOPY  2011   with esophageal stent, perforation    ESOPHAGOGASTRODUODENOSCOPY (EGD) WITH PROPOFOL N/A 05/27/2015   Procedure: ESOPHAGOGASTRODUODENOSCOPY (EGD) WITH PROPOFOL;  Surgeon: Clarene Essex, MD;  Location: [facility];  Service: Endoscopy;  Laterality: N/A;   HEMORRHOID SURGERY  2005   LEFT OOPHORECTOMY  3474   complicated by ureteral injuery requiring a ureteral stent    MMK / ANTERIOR VESICOURETHROPEXY / URETHROPEXY  2000   TONSILLECTOMY AND ADENOIDECTOMY     TOTAL ABDOMINAL HYSTERECTOMY  2000   with USO   TUBAL LIGATION  1981   wisdom teeth removal  1964    There were no vitals filed for this visit.   Subjective Assessment - 03/03/21 0932     Subjective Patient reports having shoulder pain today, not sure why.  Knee is feeling better.  Slight episode of dizziness getting into bed yesterday, but elevated head of bed and it went away.  Mild headache on R side.    Pertinent History history of falls with injury    Limitations Standing;Walking;House hold activities;Other (comment)   shopping, stairs   How long can you walk comfortably? 10-15 minutes    Diagnostic tests 10/14/20- L knee Xrays 1. No fracture or dislocation of the right knee.  2. Mild tricompartmental osteoarthritis.  10/14/20- cervical/head CT- Moderate multilevel degenerative disc disease is noted in the  cervical spine. No acute abnormality is noted.    Patient Stated Goals feel more steady    Pain Score 5  Pain Location Shoulder    Pain Orientation Right    Pain Descriptors / Indicators Aching    Pain Onset Other (comment)    Pain Onset Other (comment)                               [facility] Adult PT Treatment/Exercise - 03/03/21 0001       Exercises   Exercises Knee/Hip      Knee/Hip Exercises: Standing   Heel Raises Both;2 sets;10 reps    Hip Abduction Stengthening;Both;2 sets;10 reps    Hip Extension Stengthening;Both;2 sets;10 reps    Functional Squat 2 sets;10 reps    Functional Squat Limitations at counter, chair placed behind for safety      Shoulder Exercises: Standing   External Rotation Strengthening;Both;20 reps;Theraband    Theraband Level (Shoulder  External Rotation) Level 2 (Red)    Extension Strengthening;Both;20 reps;Theraband    Theraband Level (Shoulder Extension) Level 2 (Red)    Row Strengthening;Both;20 reps;Theraband    Theraband Level (Shoulder Row) Level 2 (Red)      Manual Therapy   Manual Therapy Soft tissue mobilization;Myofascial release    Manual therapy comments to neck and shoulders in sitting to decrease muscle spasm and headache    Soft tissue mobilization STM to bil UT, levator scapulae, cervical paraspinals    Myofascial Release TPR to R cervical multifidus C3-5             Vestibular Treatment/Exercise - 03/03/21 0001       Vestibular Treatment/Exercise   Vestibular Treatment Provided Habituation;Gaze    Gaze Exercises X1 Viewing Horizontal;X1 Viewing Vertical;X2 Viewing Horizontal;X2 Viewing Vertical      X1 Viewing Horizontal   Foot Position seated    Reps 30    Comments no dizziness or saccades      X1 Viewing Vertical   Foot Position seated    Reps 30   3 x 10   Comments increased lightheadedness, increased saccades when changing directions      X2 Viewing Horizontal   Foot Position seated    Reps 10     Comments no saccades, no dizziness      X2 Viewing Vertical   Foot Position seated    Reps 10    Comments no saccades no dizziness                    PT Education - 03/03/21 1025     Education Details Progressed HEP for hip/postural strengthening.  Issued RTB.  Access Code: [email or web address]    Person(s) Educated Patient    Methods Explanation;Demonstration;Verbal cues;Handout    Comprehension Verbalized understanding;Returned demonstration              PT Short Term Goals - 02/28/21 1234       PT SHORT TERM GOAL #1   Title Pt. will be evaluated for balance.    Time 2    Period Weeks    Status Achieved   02/28/21 - completed FGA and mCTSIB   Target Date 03/08/21               PT Long Term Goals - 02/28/21 1235       PT LONG TERM GOAL #1   Title Patient to  be independent with advanced HEP to improve outcomes    Time 8    Period Weeks    Status On-going    Target Date 04/19/21  PT LONG TERM GOAL #2   Title Patient will demonstrate resolution of BPPV symptoms.    Time 8    Period Weeks    Status Achieved   02/28/21- negative dix-hallpike bil   Target Date 04/19/21      PT LONG TERM GOAL #3   Title Patient will be able to ascend/descend stairs without increased L knee pain.    Time 8    Period Weeks    Status On-going   02/28/21- no knee pain with stairs today   Target Date 04/19/21      PT LONG TERM GOAL #4   Title Patient will demonstrate improved cervical ROM by 10 deg all directions to improve functional mobility.    Baseline see flowsheet    Time 8    Period Weeks    Status On-going    Target Date 04/19/21      PT LONG TERM GOAL #5   Title Patient will be able to look upwards without limitation from dizziness to shop.    Baseline dizziness with prolonged neck extension.    Time 8    Period Weeks    Status On-going    Target Date 04/19/21                   Plan - 03/03/21 1026     Clinical Impression Statement Mariene reports only 1 brief incident of dizziness since last session.  Today focused on progressing HEP for hip and postural strengthening, which she tolerated well with no complaint of increased knee or shoulder pain.  Reviewed VOR exercises, noted continued saccades and light headed feeling with vertical VOR x 1, but no difficulty with horizontal VOR or VOR x 2.   Reported decreased headache after manual therapy to neck.  She would benefit from continued skilled therapy.    Personal Factors and Comorbidities Comorbidity 3+    Comorbidities falls with injury, vertigo, BPPV, osteoarthritis, HTN, chronic L knee pain    Examination-Activity Limitations Bed Mobility;Transfers;Locomotion Level;Stairs;Reach Overhead    Examination-Participation Restrictions Cleaning;Shop;Community Activity;Occupation     Stability/Clinical Decision Making Evolving/Moderate complexity    Rehab Potential Good    PT Frequency 2x / week    PT Duration 8 weeks    PT Treatment/Interventions ADLs/Self Care Home Management;Cryotherapy;Electrical Stimulation;Moist Heat;Iontophoresis 4mg /ml Dexamethasone;Ultrasound;Gait training;Stair training;Functional mobility training;Therapeutic activities;Therapeutic exercise;Balance training;Neuromuscular re-education;Patient/family education;Manual techniques;Passive range of motion;Dry needling;Taping;Vestibular;Spinal Manipulations;Joint Manipulations    PT Next Visit Plan Recheck BPPV - no conversion to horizontal.  Tandem stance, steps, manual therapy for cervical.    PT Home Exercise Plan Access Code: [email or web address]    Consulted and Agree with Plan of Care Patient             Patient will benefit from skilled therapeutic intervention in order to improve the following deficits and impairments:  Decreased balance, Decreased endurance, Decreased mobility, Difficulty walking, Increased muscle spasms, Decreased range of motion, Dizziness, Impaired perceived functional ability, Decreased activity tolerance, Decreased safety awareness, Decreased strength, Impaired flexibility, Postural dysfunction, Pain  Visit Diagnosis: Disequilibrium  Left knee pain, unspecified chronicity  Recurrent falls  Unsteady gait  Abnormal posture     Problem List 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   Sleep apnea 09/04/2018   Vitamin B12 deficiency 02/18/2018  Bruit of right carotid artery 05/26/2017   Vertigo 11/23/2016   Vitamin D deficiency 04/06/2016   Osteopenia 10/09/2015   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  [place], [place], [address] Phone: [phone]   Fax:  [phone]  Name: AUDREA BOLTE MRN: [id number] Date of Birth: May 17, 1950

## 2021-03-07 ENCOUNTER — Encounter: Payer: Self-pay | Admitting: Physical Therapy

## 2021-03-07 ENCOUNTER — Ambulatory Visit: Payer: Medicare Other | Admitting: Physical Therapy

## 2021-03-07 ENCOUNTER — Other Ambulatory Visit: Payer: Self-pay

## 2021-03-07 DIAGNOSIS — M25562 Pain in left knee: Secondary | ICD-10-CM | POA: Diagnosis not present

## 2021-03-07 DIAGNOSIS — R296 Repeated falls: Secondary | ICD-10-CM | POA: Diagnosis not present

## 2021-03-07 DIAGNOSIS — R2681 Unsteadiness on feet: Secondary | ICD-10-CM | POA: Diagnosis not present

## 2021-03-07 DIAGNOSIS — R42 Dizziness and giddiness: Secondary | ICD-10-CM | POA: Diagnosis not present

## 2021-03-07 DIAGNOSIS — R293 Abnormal posture: Secondary | ICD-10-CM

## 2021-03-07 NOTE — Therapy (Signed)
[facility] [address]  [place], [place], [address] Phone: [phone]   Fax:  [phone]  Physical Therapy Evaluation  Patient Details  Name: Carrie Elliott MRN: [id number] Date of Birth: 07-29-1950 Referring Provider (PT): Penni Homans   Encounter Date: 03/07/2021   PT End of Session - 03/07/21 0934     Visit Number 4    Number of Visits 16    Date for PT Re-Evaluation 04/19/21    Authorization Type Medicare + supplement    Progress Note Due on Visit 10    PT Start Time 0934    PT Stop Time 1015    PT Time Calculation (min) 41 min    Activity Tolerance Patient tolerated treatment well   nausea with BPPV testing and treatment   Behavior During Therapy [facility] for tasks assessed/performed             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    Past Surgical History:  Procedure Laterality Date   APPENDECTOMY  1989   atypical nevus excision     BREAST BIOPSY Right    CHOLECYSTECTOMY  1989   COLONOSCOPY WITH PROPOFOL N/A 05/27/2015   Procedure: COLONOSCOPY WITH PROPOFOL;  Surgeon: Clarene Essex, MD;  Location: [facility];  Service: Endoscopy;  Laterality: N/A;   CYSTOSCOPY W/ URETERAL STENT REMOVAL  1999   ESOPHAGOGASTRODUODENOSCOPY  2011   with esophageal stent, perforation    ESOPHAGOGASTRODUODENOSCOPY (EGD) WITH PROPOFOL N/A 05/27/2015   Procedure: ESOPHAGOGASTRODUODENOSCOPY (EGD) WITH PROPOFOL;  Surgeon: Clarene Essex, MD;  Location: [facility];  Service: Endoscopy;  Laterality: N/A;   HEMORRHOID SURGERY  2005   LEFT OOPHORECTOMY  2992   complicated by ureteral injuery requiring a ureteral stent    MMK / ANTERIOR VESICOURETHROPEXY / URETHROPEXY  2000   TONSILLECTOMY AND ADENOIDECTOMY     TOTAL ABDOMINAL HYSTERECTOMY  2000   with USO   TUBAL LIGATION  1981   wisdom teeth removal  1964    There were no vitals filed for this visit.    Subjective Assessment - 03/07/21 0938     Subjective Patient reported she felt a bit weird getting out of bed this morning, just off, but not dizzy.  Didn't sleep well last night.   Otherwise no episodes of dizziness.   Exercises ok when does them.  Knee has not been bothering her.    Pertinent History history of falls with injury    Limitations Standing;Walking;House hold activities;Other (comment)   shopping, stairs   How long can you walk comfortably? 10-15 minutes    Diagnostic tests 10/14/20- L knee Xrays 1. No fracture or dislocation of the right knee.  2. Mild tricompartmental osteoarthritis.  10/14/20- cervical/head CT- Moderate multilevel degenerative disc disease is noted in the  cervical spine. No acute abnormality is noted.    Patient Stated Goals feel more steady  Currently in Pain? No/denies    Pain Onset Other (comment)    Pain Onset Other (comment)                            Objective measurements completed on examination: See above findings.       [facility] Adult PT Treatment/Exercise - 03/07/21 0934       Knee/Hip Exercises: Standing   Heel Raises Both;10 reps    Heel Raises Limitations heel and toe raises    Hip Abduction Stengthening;Both;2 sets;10 reps    Hip Extension Stengthening;Both;2 sets;10 reps    Other Standing Knee Exercises foward T-s 2 x 10 bil with UE support on counter      Shoulder Exercises: ROM/Strengthening   UBE (Upper Arm Bike) L1.0 x 6 min (3' each fwd & back)      Manual Therapy   Manual  Therapy Soft tissue mobilization;Myofascial release    Manual therapy comments to neck and shoulders in sitting to decrease muscle spasm and headache    Soft tissue mobilization STM to bil UT, levator scapulae, cervical paraspinals    Myofascial Release TPR to R cervical multifidus C3-5                 Balance Exercises - 03/07/21 0001       Balance Exercises: Standing   Standing Eyes Opened Foam/compliant surface;Head turns;Limitations    Standing Eyes Opened Limitations on airex eyes open x 30 sec, head turns x 10, head nods x 10, in corner with SBA for safety, increased sway with head nods.    Standing Eyes Closed Head turns;Foam/compliant surface;Limitations    Standing Eyes Closed Limitations on airex eyes closed x 30 sec, head turns x 10, head nods x 10, in corner with SBA for safety, increased sway with head movements, tactile cues to decrease sway as needed to prevent LOB    Tandem Stance 2 reps;30 secs;Limitations    Tandem Stance Time in corner for safety with SBA, reports easier with L foot foward                  PT Short Term Goals - 02/28/21 1234       PT SHORT TERM GOAL #1   Title Pt. will be evaluated for balance.    Time 2    Period Weeks    Status Achieved   02/28/21 - completed FGA and mCTSIB   Target Date 03/08/21               PT Long Term Goals - 02/28/21 1235       PT LONG TERM GOAL #1   Title Patient to be independent with advanced HEP to improve outcomes    Time 8    Period Weeks    Status On-going    Target Date 04/19/21      PT LONG TERM GOAL #2   Title Patient will demonstrate resolution of BPPV symptoms.    Time 8    Period Weeks    Status Achieved   02/28/21- negative dix-hallpike bil   Target Date 04/19/21      PT LONG TERM GOAL #3   Title Patient will be able to ascend/descend stairs without increased L knee pain.    Time 8    Period Weeks    Status On-going   02/28/21- no knee pain with stairs today   Target Date  04/19/21  PT LONG TERM GOAL #4   Title Patient will demonstrate improved cervical ROM by 10 deg all directions to improve functional mobility.    Baseline see flowsheet    Time 8    Period Weeks    Status On-going    Target Date 04/19/21      PT LONG TERM GOAL #5   Title Patient will be able to look upwards without limitation from dizziness to shop.    Baseline dizziness with prolonged neck extension.    Time 8    Period Weeks    Status On-going    Target Date 04/19/21                    Plan - 03/07/21 1300     Clinical Impression Statement Carrie Elliott reports no further episodes of spinning or dizziness, did have momentary light headedness this morning but this may be due to poor sleep quality.  Checked horizontal canals which were clear.  Focus of today's interventions was continuing to progress balance and strength, no difficulty with position changes with forward T-bends today, tandem stance is improving but still challenging, still challenged with eyes closed on compliant surface and head position changes on compliant surface.  End of session continued manual therapy to decrease neck tightness/stiffness, which she reports is also improving.  She would benefit from continued skilled therapy.    Personal Factors and Comorbidities Comorbidity 3+    Comorbidities falls with injury, vertigo, BPPV, osteoarthritis, HTN, chronic L knee pain    Examination-Activity Limitations Bed Mobility;Transfers;Locomotion Level;Stairs;Reach Overhead    Examination-Participation Restrictions Cleaning;Shop;Community Activity;Occupation    Stability/Clinical Decision Making Evolving/Moderate complexity    Rehab Potential Good    PT Frequency 2x / week    PT Duration 8 weeks    PT Treatment/Interventions ADLs/Self Care Home Management;Cryotherapy;Electrical Stimulation;Moist Heat;Iontophoresis 4mg /ml Dexamethasone;Ultrasound;Gait training;Stair training;Functional mobility training;Therapeutic  activities;Therapeutic exercise;Balance training;Neuromuscular re-education;Patient/family education;Manual techniques;Passive range of motion;Dry needling;Taping;Vestibular;Spinal Manipulations;Joint Manipulations    PT Next Visit Plan stepping strategy, manual therapy as needed.    PT Home Exercise Plan Access Code: [email or web address]    Consulted and Agree with Plan of Care Patient             Patient will benefit from skilled therapeutic intervention in order to improve the following deficits and impairments:  Decreased balance, Decreased endurance, Decreased mobility, Difficulty walking, Increased muscle spasms, Decreased range of motion, Dizziness, Impaired perceived functional ability, Decreased activity tolerance, Decreased safety awareness, Decreased strength, Impaired flexibility, Postural dysfunction, Pain  Visit Diagnosis: Disequilibrium  Left knee pain, unspecified chronicity  Recurrent falls  Unsteady gait  Abnormal posture     Problem List 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  [place], [place], [address] Phone: [phone]   Fax:  [phone]  Name: Carrie Elliott MRN: [id number] Date of Birth: 21-Mar-1951

## 2021-03-10 ENCOUNTER — Other Ambulatory Visit: Payer: Self-pay

## 2021-03-10 ENCOUNTER — Ambulatory Visit: Payer: Medicare Other | Admitting: Physical Therapy

## 2021-03-10 ENCOUNTER — Encounter: Payer: Self-pay | Admitting: Physical Therapy

## 2021-03-10 DIAGNOSIS — M25562 Pain in left knee: Secondary | ICD-10-CM

## 2021-03-10 DIAGNOSIS — R2681 Unsteadiness on feet: Secondary | ICD-10-CM

## 2021-03-10 DIAGNOSIS — R42 Dizziness and giddiness: Secondary | ICD-10-CM | POA: Diagnosis not present

## 2021-03-10 DIAGNOSIS — R293 Abnormal posture: Secondary | ICD-10-CM

## 2021-03-10 DIAGNOSIS — R296 Repeated falls: Secondary | ICD-10-CM | POA: Diagnosis not present

## 2021-03-10 NOTE — Therapy (Signed)
[facility] [address]  [place], [place], [address] Phone: [phone]   Fax:  [phone]  Physical Therapy Treatment  Patient Details  Name: Carrie Elliott MRN: [id number] Date of Birth: 1950-10-26 Referring Provider (PT): Penni Homans   Encounter Date: 03/10/2021   PT End of Session - 03/10/21 0933     Visit Number 5    Number of Visits 16    Date for PT Re-Evaluation 04/19/21    Authorization Type Medicare + supplement    Progress Note Due on Visit 10    PT Start Time 0930    PT Stop Time 1017    PT Time Calculation (min) 47 min    Activity Tolerance Patient tolerated treatment well   nausea with BPPV testing and treatment   Behavior During Therapy [facility] for tasks assessed/performed             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    Past Surgical History:  Procedure Laterality Date   APPENDECTOMY  1989   atypical nevus excision     BREAST BIOPSY Right    CHOLECYSTECTOMY  1989   COLONOSCOPY WITH PROPOFOL N/A 05/27/2015   Procedure: COLONOSCOPY WITH PROPOFOL;  Surgeon: Clarene Essex, MD;  Location: [facility];  Service: Endoscopy;  Laterality: N/A;   CYSTOSCOPY W/ URETERAL STENT REMOVAL  1999   ESOPHAGOGASTRODUODENOSCOPY  2011   with esophageal stent, perforation    ESOPHAGOGASTRODUODENOSCOPY (EGD) WITH PROPOFOL N/A 05/27/2015   Procedure: ESOPHAGOGASTRODUODENOSCOPY (EGD) WITH PROPOFOL;  Surgeon: Clarene Essex, MD;  Location: [facility];  Service: Endoscopy;  Laterality: N/A;   HEMORRHOID SURGERY  2005   LEFT OOPHORECTOMY  9381   complicated by ureteral injuery requiring a ureteral stent    MMK / ANTERIOR VESICOURETHROPEXY / URETHROPEXY  2000   TONSILLECTOMY AND ADENOIDECTOMY     TOTAL ABDOMINAL HYSTERECTOMY  2000   with USO   TUBAL LIGATION  1981   wisdom teeth removal  1964    There were no vitals filed for this visit.   Subjective Assessment - 03/10/21 0932     Subjective Patient reports she has not had any episodes of dizziness since last visit.  Had a little knee pain yesterday, but once moved around it got better.    Pertinent History history of falls with injury    Limitations Standing;Walking;House hold activities;Other (comment)   shopping, stairs   How long can you walk comfortably? 10-15 minutes    Diagnostic tests 10/14/20- L knee Xrays 1. No fracture or dislocation of the right knee.  2. Mild tricompartmental osteoarthritis.  10/14/20- cervical/head CT- Moderate multilevel degenerative disc disease is noted in the  cervical spine. No acute abnormality is noted.    Patient Stated Goals feel more steady    Currently in Pain? No/denies    Pain Onset Other (comment)    Pain Onset Other (comment)  [facility] Adult PT Treatment/Exercise - 03/10/21 0001       Exercises   Exercises Knee/Hip      Modalities   Modalities Iontophoresis      Iontophoresis   Type of Iontophoresis Dexamethasone    Location R medial knee    Dose 4 mg/ml    Time 4 hours wear time      Manual Therapy   Manual Therapy Soft tissue mobilization;Myofascial release    Manual therapy comments to neck and shoulders in sitting to decrease muscle spasm and headache    Soft tissue mobilization STM to bil UT, levator scapulae, cervical paraspinals    Myofascial Release TPR to R cervical multifidus C3-5             Vestibular Treatment/Exercise -  03/10/21 0001       Vestibular Treatment/Exercise   Vestibular Treatment Provided Habituation;Gaze    Gaze Exercises X2 Viewing Horizontal;X2 Viewing Vertical      X1 Viewing Horizontal   Foot Position walking    Reps 30    Comments walking with target popsicle stick with horizontal head turns, SBA for safety 2 x 50'      X1 Viewing Vertical   Foot Position walking    Reps 30    Comments walking with target popsicle stick with vertical head nods, SBA for safety 2 x 50'      X2 Viewing Horizontal   Foot Position seated    Reps 10     Comments no saccades, no dizziness      X2 Viewing Vertical   Foot Position seated    Reps 10    Comments no saccades no dizziness                Balance Exercises - 03/10/21 0001       Balance Exercises: Standing   Standing Eyes Opened Foam/compliant surface;Head turns;Limitations    Standing Eyes Opened Limitations on airex eyes open x 30 sec, head turns x 10, head nods x 10, in corner with SBA for safety    Standing Eyes Closed Head turns;Foam/compliant surface;Limitations    Standing Eyes Closed Limitations on airex eyes closed x 30 sec, head turns x 10, head nods x 10, in corner with SBA for safety, increased sway with head movements, tactile cues to decrease sway as needed to prevent LOB                  PT Short Term Goals - 02/28/21 1234       PT SHORT TERM GOAL #1   Title Pt. will be evaluated for balance.    Time 2    Period Weeks    Status Achieved   02/28/21 - completed FGA and mCTSIB   Target Date 03/08/21               PT Long Term Goals - 02/28/21 1235       PT LONG TERM GOAL #1   Title Patient to be independent with advanced HEP to improve outcomes    Time 8    Period Weeks    Status On-going    Target Date 04/19/21      PT LONG TERM GOAL #2   Title Patient will demonstrate resolution of BPPV symptoms.    Time 8    Period Weeks    Status Achieved   02/28/21- negative dix-hallpike bil    Target Date 04/19/21      PT LONG TERM GOAL #3  Title Patient will be able to ascend/descend stairs without increased L knee pain.    Time 8    Period Weeks    Status On-going   02/28/21- no knee pain with stairs today   Target Date 04/19/21      PT LONG TERM GOAL #4   Title Patient will demonstrate improved cervical ROM by 10 deg all directions to improve functional mobility.    Baseline see flowsheet    Time 8    Period Weeks    Status On-going    Target Date 04/19/21      PT LONG TERM GOAL #5   Title Patient will be able to look upwards without limitation from dizziness to shop.    Baseline dizziness with prolonged neck extension.    Time 8    Period Weeks    Status On-going    Target Date 04/19/21                   Plan - 03/10/21 1034     Clinical Impression Statement Carrie Elliott is making good progress and compliant with HEP.  She reports no further episodes of dizziness.  She did demonstrate tenderness L medial knee, trialed iontophoresis patch today after education on risks and benefits.  Reviewed and progressed VOR exercises to include walking in hallway, initially demonstrating more path deviations but improved as trial progressed.  Also more steady today on airex with eyes closed, however still has increased posterior sway with neck extension.  She will benefit from continued skilled therapy.    Personal Factors and Comorbidities Comorbidity 3+    Comorbidities falls with injury, vertigo, BPPV, osteoarthritis, HTN, chronic L knee pain    Examination-Activity Limitations Bed Mobility;Transfers;Locomotion Level;Stairs;Reach Overhead    Examination-Participation Restrictions Cleaning;Shop;Community Activity;Occupation    Stability/Clinical Decision Making Evolving/Moderate complexity    Rehab Potential Good    PT Frequency 2x / week    PT Duration 8 weeks    PT Treatment/Interventions ADLs/Self Care Home Management;Cryotherapy;Electrical Stimulation;Moist  Heat;Iontophoresis 4mg /ml Dexamethasone;Ultrasound;Gait training;Stair training;Functional mobility training;Therapeutic activities;Therapeutic exercise;Balance training;Neuromuscular re-education;Patient/family education;Manual techniques;Passive range of motion;Dry needling;Taping;Vestibular;Spinal Manipulations;Joint Manipulations    PT Next Visit Plan stepping strategy, manual therapy as needed.    PT Home Exercise Plan Access Code: [email or web address]    Consulted and Agree with Plan of Care Patient             Patient will benefit from skilled therapeutic intervention in order to improve the following deficits and impairments:  Decreased balance, Decreased endurance, Decreased mobility, Difficulty walking, Increased muscle spasms, Decreased range of motion, Dizziness, Impaired perceived functional ability, Decreased activity tolerance, Decreased safety awareness, Decreased strength, Impaired flexibility, Postural dysfunction, Pain  Visit Diagnosis: Disequilibrium  Left knee pain, unspecified chronicity  Recurrent falls  Unsteady gait  Abnormal posture     Problem List 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  [place], [place], [address] Phone: [phone]   Fax:  [phone]  Name: Carrie Elliott MRN: [id number] Date of Birth: 03-Apr-1950

## 2021-03-13 ENCOUNTER — Encounter: Payer: Self-pay | Admitting: Physical Therapy

## 2021-03-13 ENCOUNTER — Other Ambulatory Visit: Payer: Self-pay

## 2021-03-13 ENCOUNTER — Ambulatory Visit: Payer: Medicare Other | Admitting: Physical Therapy

## 2021-03-13 DIAGNOSIS — R42 Dizziness and giddiness: Secondary | ICD-10-CM

## 2021-03-13 DIAGNOSIS — R2681 Unsteadiness on feet: Secondary | ICD-10-CM

## 2021-03-13 DIAGNOSIS — M25562 Pain in left knee: Secondary | ICD-10-CM | POA: Diagnosis not present

## 2021-03-13 DIAGNOSIS — R293 Abnormal posture: Secondary | ICD-10-CM | POA: Diagnosis not present

## 2021-03-13 DIAGNOSIS — R296 Repeated falls: Secondary | ICD-10-CM | POA: Diagnosis not present

## 2021-03-13 NOTE — Therapy (Signed)
[facility] [address]  [place], [place], [address] Phone: [phone]   Fax:  [phone]  Physical Therapy Treatment  Patient Details  Name: Carrie Elliott MRN: [id number] Date of Birth: 06/03/1950 Referring Provider (PT): Penni Homans   Encounter Date: 03/13/2021   PT End of Session - 03/13/21 0933     Visit Number 6    Number of Visits 16    Date for PT Re-Evaluation 04/19/21    Authorization Type Medicare + supplement    Progress Note Due on Visit 10    PT Start Time 0931    PT Stop Time 1010    PT Time Calculation (min) 39 min    Activity Tolerance Patient tolerated treatment well   nausea with BPPV testing and treatment   Behavior During Therapy [facility] for tasks assessed/performed             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    Past Surgical History:  Procedure Laterality Date   APPENDECTOMY  1989   atypical nevus excision     BREAST BIOPSY Right    CHOLECYSTECTOMY  1989   COLONOSCOPY WITH PROPOFOL N/A 05/27/2015   Procedure: COLONOSCOPY WITH PROPOFOL;  Surgeon: Clarene Essex, MD;  Location: [facility];  Service: Endoscopy;  Laterality: N/A;   CYSTOSCOPY W/ URETERAL STENT REMOVAL  1999   ESOPHAGOGASTRODUODENOSCOPY  2011   with esophageal stent, perforation    ESOPHAGOGASTRODUODENOSCOPY (EGD) WITH PROPOFOL N/A 05/27/2015   Procedure: ESOPHAGOGASTRODUODENOSCOPY (EGD) WITH PROPOFOL;  Surgeon: Clarene Essex, MD;  Location: [facility];  Service: Endoscopy;  Laterality: N/A;   HEMORRHOID SURGERY  2005   LEFT OOPHORECTOMY  5176   complicated by ureteral injuery requiring a ureteral stent    MMK / ANTERIOR VESICOURETHROPEXY / URETHROPEXY  2000   TONSILLECTOMY AND ADENOIDECTOMY     TOTAL ABDOMINAL HYSTERECTOMY  2000   with USO   TUBAL LIGATION  1981   wisdom teeth removal  1964    There were no vitals filed for this visit.   Subjective Assessment - 03/13/21 0932     Subjective Patient has not had any dizziness over weekend.  Occasional twinges in L knee when wake up, but that isn't daily.  Tolerated the ionto patch well but has not noticed any difference.    Pertinent History history of falls with injury    Limitations Standing;Walking;House hold activities;Other (comment)   shopping, stairs   How long can you walk comfortably? 10-15 minutes    Diagnostic tests 10/14/20- L knee Xrays 1. No fracture or dislocation of the right knee.  2. Mild tricompartmental osteoarthritis.  10/14/20- cervical/head CT- Moderate multilevel degenerative disc disease is noted in the  cervical spine. No acute abnormality is noted.    Patient Stated Goals feel more steady    Currently in Pain? No/denies    Pain Onset Other (comment)  Pain Onset Other (comment)                               [facility] Adult PT Treatment/Exercise - 03/13/21 0001       Ambulation/Gait   Stairs Yes    Stairs Assistance 6: Modified independent (Device/Increase time)    Number of Stairs 12    Height of Stairs 8      Knee/Hip Exercises: Standing   Hip Abduction Stengthening;Both;2 sets;10 reps    Abduction Limitations GTB around thighs for strengthening    Hip Extension Stengthening;Both;2 sets;10 reps    Extension Limitations GTB around thighs    Step Down Both;2 sets;5 sets;Hand Hold: 1;Step Height: 2"    Step Down Limitations dips for eccentric control    Functional Squat 2 sets;10 reps    Functional  Squat Limitations GTB around thighs for cues to prevent knee valgus                 Balance Exercises - 03/13/21 0001       Balance Exercises: Standing   Standing Eyes Opened Foam/compliant surface;Head turns;Limitations    Standing Eyes Opened Limitations on airex eyes open x 30 sec, head turns x 10, head nods x 10, in corner with SBA for safety    Standing Eyes Closed Head turns;Foam/compliant surface;Limitations    Standing Eyes Closed Limitations on airex eyes closed x 30 sec, head turns x 10, head nods x 10, in corner with SBA for safety, increased sway with head movements, tactile cues to decrease sway as needed to prevent LOB    Gait with Head Turns Forward;Retro;Limitations    Gait with Head Turns Limitations with ball toss starting self ball toss progressing to toss back and forth between pt. and PT. 2 x 50' each    Tandem Gait Forward;Limitations    Tandem Gait Limitations 3 x 30'   improving                 PT Short Term Goals - 02/28/21 1234       PT SHORT TERM GOAL #1   Title Pt. will be evaluated for balance.    Time 2    Period Weeks    Status Achieved   02/28/21 - completed FGA and mCTSIB   Target Date 03/08/21               PT Long Term Goals - 03/13/21 0933       PT LONG TERM GOAL #1   Title Patient to be independent with advanced HEP to improve outcomes    Time 8    Period Weeks    Status On-going    Target Date 04/19/21      PT LONG TERM GOAL #2   Title Patient will demonstrate resolution of BPPV symptoms.    Time 8    Period Weeks    Status Achieved   02/28/21- negative dix-hallpike bil   Target Date 04/19/21      PT LONG TERM GOAL #3   Title Patient will be able to ascend/descend stairs without increased L knee pain.    Time 8    Period Weeks    Status On-going   02/28/21- no knee pain with stairs today   Target Date 04/19/21      PT LONG TERM GOAL #4   Title Patient will demonstrate improved cervical ROM by 10 deg all  directions to improve  functional mobility.    Baseline see flowsheet    Time 8    Period Weeks    Status On-going    Target Date 04/19/21      PT LONG TERM GOAL #5   Title Patient will be able to look upwards without limitation from dizziness to shop.    Baseline dizziness with prolonged neck extension.    Time 8    Period Weeks    Status On-going   03/13/21- no dizziness with looking up for shorter periods.   Target Date 04/19/21                   Plan - 03/13/21 1013     Clinical Impression Statement Alaysia continues to make good progress, with resolution of dizziness.  Today continued focus on LE strengthening and balance.  She reports continued discomfort with descent with stairs, noted knee valgus with eccentric dips on step, but when added Tband around knees with squats this improved.  Progressed exercises to add GTB to hip abduction, extension and squats.  She is also demonstrating improvement with tandem gait, able to take 5 steps before stepping off line.  Progressed VOR exercises to standing position as continues to have no dizziness or difficulty with current VOR.    Personal Factors and Comorbidities Comorbidity 3+    Comorbidities falls with injury, vertigo, BPPV, osteoarthritis, HTN, chronic L knee pain    Examination-Activity Limitations Bed Mobility;Transfers;Locomotion Level;Stairs;Reach Overhead    Examination-Participation Restrictions Cleaning;Shop;Community Activity;Occupation    Stability/Clinical Decision Making Evolving/Moderate complexity    Rehab Potential Good    PT Frequency 2x / week    PT Duration 8 weeks    PT Treatment/Interventions ADLs/Self Care Home Management;Cryotherapy;Electrical Stimulation;Moist Heat;Iontophoresis 4mg /ml Dexamethasone;Ultrasound;Gait training;Stair training;Functional mobility training;Therapeutic activities;Therapeutic exercise;Balance training;Neuromuscular re-education;Patient/family education;Manual techniques;Passive  range of motion;Dry needling;Taping;Vestibular;Spinal Manipulations;Joint Manipulations    PT Next Visit Plan stepping strategy, manual therapy as needed.    PT Home Exercise Plan Access Code: [email or web address]    Consulted and Agree with Plan of Care Patient             Patient will benefit from skilled therapeutic intervention in order to improve the following deficits and impairments:  Decreased balance, Decreased endurance, Decreased mobility, Difficulty walking, Increased muscle spasms, Decreased range of motion, Dizziness, Impaired perceived functional ability, Decreased activity tolerance, Decreased safety awareness, Decreased strength, Impaired flexibility, Postural dysfunction, Pain  Visit Diagnosis: Disequilibrium  Left knee pain, unspecified chronicity  Recurrent falls  Unsteady gait  Abnormal posture     Problem List 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   [place], [place], [address] Phone: [phone]   Fax:  [phone]  Name: Carrie Elliott MRN: [id number] Date of Birth: [birth year]

## 2021-03-14 DIAGNOSIS — H43811 Vitreous degeneration, right eye: Secondary | ICD-10-CM | POA: Diagnosis not present

## 2021-03-16 ENCOUNTER — Encounter: Payer: Medicare Other | Admitting: Physical Therapy

## 2021-03-21 ENCOUNTER — Encounter: Payer: Medicare Other | Admitting: Physical Therapy

## 2021-03-22 ENCOUNTER — Other Ambulatory Visit: Payer: Self-pay

## 2021-03-22 ENCOUNTER — Encounter: Payer: Self-pay | Admitting: Physical Therapy

## 2021-03-22 ENCOUNTER — Ambulatory Visit: Payer: Medicare Other | Admitting: Physical Therapy

## 2021-03-22 DIAGNOSIS — R2681 Unsteadiness on feet: Secondary | ICD-10-CM

## 2021-03-22 DIAGNOSIS — R42 Dizziness and giddiness: Secondary | ICD-10-CM | POA: Diagnosis not present

## 2021-03-22 DIAGNOSIS — R296 Repeated falls: Secondary | ICD-10-CM | POA: Diagnosis not present

## 2021-03-22 DIAGNOSIS — M25562 Pain in left knee: Secondary | ICD-10-CM

## 2021-03-22 DIAGNOSIS — R293 Abnormal posture: Secondary | ICD-10-CM | POA: Diagnosis not present

## 2021-03-22 NOTE — Therapy (Signed)
[facility] [address]  [place], [place], [address] Phone: [phone]   Fax:  [phone]  Physical Therapy Treatment  Patient Details  Name: Carrie Elliott MRN: [id number] Date of Birth: Jan 10, 1951 Referring Provider (PT): Penni Homans   Encounter Date: 03/22/2021   PT End of Session - 03/22/21 0939     Visit Number 7    Number of Visits 16    Date for PT Re-Evaluation 04/19/21    Authorization Type Medicare + supplement    Progress Note Due on Visit 10    PT Start Time 0934    PT Stop Time 1015    PT Time Calculation (min) 41 min    Activity Tolerance Patient tolerated treatment well   nausea with BPPV testing and treatment   Behavior During Therapy 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    Past Surgical History:  Procedure Laterality Date   APPENDECTOMY  1989   atypical nevus excision     BREAST BIOPSY Right    CHOLECYSTECTOMY  1989   COLONOSCOPY WITH PROPOFOL N/A 05/27/2015   Procedure: COLONOSCOPY WITH PROPOFOL;  Surgeon: Clarene Essex, MD;  Location: [facility];  Service: Endoscopy;  Laterality: N/A;   CYSTOSCOPY W/ URETERAL STENT REMOVAL  1999   ESOPHAGOGASTRODUODENOSCOPY  2011   with esophageal stent, perforation    ESOPHAGOGASTRODUODENOSCOPY (EGD) WITH PROPOFOL N/A 05/27/2015   Procedure: ESOPHAGOGASTRODUODENOSCOPY (EGD) WITH PROPOFOL;  Surgeon: Clarene Essex, MD;  Location: [facility];  Service: Endoscopy;  Laterality: N/A;   HEMORRHOID SURGERY  2005   LEFT OOPHORECTOMY  7026   complicated by ureteral injuery requiring a ureteral stent    MMK / ANTERIOR VESICOURETHROPEXY / URETHROPEXY  2000   TONSILLECTOMY AND ADENOIDECTOMY     TOTAL ABDOMINAL HYSTERECTOMY  2000   with USO   TUBAL LIGATION  1981   wisdom teeth removal  1964    There were no vitals filed for this visit.   Subjective Assessment - 03/22/21 0936     Subjective Patient reports no dizziness, but her right ear keeps popping.  L knee was painful when she woke up this morning.  Denies falls or LOB.    Pertinent History history of falls with injury    Limitations Standing;Walking;House hold activities;Other (comment)   shopping, stairs   How long can you walk comfortably? 10-15 minutes    Diagnostic tests 10/14/20- L knee Xrays 1. No fracture or dislocation of the right knee.  2. Mild tricompartmental osteoarthritis.  10/14/20- cervical/head CT- Moderate multilevel degenerative disc disease is noted in the  cervical spine. No acute abnormality is noted.    Patient Stated Goals feel more steady    Currently in Pain? Yes    Pain Score 3     Pain Location Knee  Pain Orientation Left    Pain Descriptors / Indicators Aching    Pain Onset Other (comment)    Pain Onset Other (comment)                [facility] PT Assessment - 03/22/21 0001       AROM   Cervical Flexion 50    Cervical Extension 40    Cervical - Right Rotation 60   tight   Cervical - Left Rotation 60                           [facility] Adult PT Treatment/Exercise - 03/22/21 0001       Exercises   Exercises Knee/Hip;Shoulder      Knee/Hip Exercises: Standing   Hip Abduction Stengthening;Both;2 sets;10 reps    Abduction Limitations GTB around thighs for strengthening    Hip Extension Stengthening;Both;2 sets;10 reps    Extension Limitations GTB around thighs    Step Down Both;2 sets;5  sets;Hand Hold: 1;Step Height: 2"    Step Down Limitations dips for eccentric control    Functional Squat 2 sets;10 reps    Functional Squat Limitations GTB around thighs for cues to prevent knee valgus      Shoulder Exercises: Standing   External Rotation Strengthening;Both;20 reps;Theraband    Theraband Level (Shoulder External Rotation) Level 2 (Red)    Extension Strengthening;Both;Theraband;10 reps    Theraband Level (Shoulder Extension) Level 2 (Red)    Row Strengthening;Both;20 reps;Theraband    Theraband Level (Shoulder Row) Level 2 (Red)      Manual Therapy   Manual Therapy Soft tissue mobilization;Myofascial release    Manual therapy comments to neck and shoulders in sitting to decrease muscle spasm and headache    Soft tissue mobilization STM to bil UT, levator scapulae, cervical paraspinals    Myofascial Release TPR to R cervical multifidus C3-5                       PT Short Term Goals - 02/28/21 1234       PT SHORT TERM GOAL #1   Title Pt. will be evaluated for balance.    Time 2    Period Weeks    Status Achieved   02/28/21 - completed FGA and mCTSIB   Target Date 03/08/21               PT Long Term Goals - 03/22/21 1217       PT LONG TERM GOAL #1   Title Patient to be independent with advanced HEP to improve outcomes    Time 8    Period Weeks    Status On-going    Target Date 04/19/21      PT LONG TERM GOAL #2   Title Patient will demonstrate resolution of BPPV symptoms.    Time 8    Period Weeks    Status Achieved   02/28/21- negative dix-hallpike bil   Target Date 04/19/21      PT LONG TERM GOAL #3   Title Patient will be able to ascend/descend stairs without increased L knee pain.    Time 8    Period Weeks    Status On-going   02/28/21- no knee pain with stairs today   Target Date 04/19/21      PT LONG TERM GOAL #4   Title Patient will demonstrate improved cervical ROM by 10 deg all directions to improve functional mobility.  Baseline see flowsheet    Time 8    Period Weeks    Status On-going   03/22/21- progress, 40 to 50 deg flexion, 35 to 40 deg extension, 60 deg R rotation (no change), 50 to 60 deg L rotation   Target Date 04/19/21      PT LONG TERM GOAL #5   Title Patient will be able to look upwards without limitation from dizziness to shop.    Baseline dizziness with prolonged neck extension.    Time 8    Period Weeks    Status On-going   03/13/21- no dizziness with looking up for shorter periods.  03/22/21- has not noted dizziness with shopping now   Target Date 04/19/21                   Plan - 03/22/21 0939     Clinical Impression Statement Carrie Elliott is making good progress, but had several questions on HEP today, so focused on reviewing exercises for both LE and posture.  She reports popping in her R ear, and noted with manual therapy significant tightness in R cervical paraspinals.  Her cervical ROM is improving although still tight especially with R rotation and extension.  Reports no noting dizziness anymore looking up when shopping.    Personal Factors and Comorbidities Comorbidity 3+    Comorbidities falls with injury, vertigo, BPPV, osteoarthritis, HTN, chronic L knee pain    Examination-Activity Limitations Bed Mobility;Transfers;Locomotion Level;Stairs;Reach Overhead    Examination-Participation Restrictions Cleaning;Shop;Community Activity;Occupation    Stability/Clinical Decision Making Evolving/Moderate complexity    Rehab Potential Good    PT Frequency 2x / week    PT Duration 8 weeks    PT Treatment/Interventions ADLs/Self Care Home Management;Cryotherapy;Electrical Stimulation;Moist Heat;Iontophoresis 4mg /ml Dexamethasone;Ultrasound;Gait training;Stair training;Functional mobility training;Therapeutic activities;Therapeutic exercise;Balance training;Neuromuscular re-education;Patient/family education;Manual techniques;Passive range of motion;Dry needling;Taping;Vestibular;Spinal  Manipulations;Joint Manipulations    PT Next Visit Plan stepping strategy, manual therapy as needed.    PT Home Exercise Plan Access Code: [email or web address]    Consulted and Agree with Plan of Care Patient             Patient will benefit from skilled therapeutic intervention in order to improve the following deficits and impairments:  Decreased balance, Decreased endurance, Decreased mobility, Difficulty walking, Increased muscle spasms, Decreased range of motion, Dizziness, Impaired perceived functional ability, Decreased activity tolerance, Decreased safety awareness, Decreased strength, Impaired flexibility, Postural dysfunction, Pain  Visit Diagnosis: Disequilibrium  Left knee pain, unspecified chronicity  Recurrent falls  Unsteady gait  Abnormal posture     Problem List 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  [place], [place], [address] Phone: [phone]    Fax:  [phone]  Name: Carrie Elliott MRN: [id number] Date of Birth: 21-Aug-1950

## 2021-03-24 ENCOUNTER — Ambulatory Visit: Payer: Medicare Other | Admitting: Physical Therapy

## 2021-03-24 ENCOUNTER — Other Ambulatory Visit: Payer: Self-pay

## 2021-03-24 ENCOUNTER — Encounter: Payer: Medicare Other | Admitting: Physical Therapy

## 2021-03-24 ENCOUNTER — Encounter: Payer: Self-pay | Admitting: Physical Therapy

## 2021-03-24 DIAGNOSIS — R42 Dizziness and giddiness: Secondary | ICD-10-CM

## 2021-03-24 DIAGNOSIS — R293 Abnormal posture: Secondary | ICD-10-CM

## 2021-03-24 DIAGNOSIS — R2681 Unsteadiness on feet: Secondary | ICD-10-CM

## 2021-03-24 DIAGNOSIS — M25562 Pain in left knee: Secondary | ICD-10-CM | POA: Diagnosis not present

## 2021-03-24 DIAGNOSIS — R296 Repeated falls: Secondary | ICD-10-CM | POA: Diagnosis not present

## 2021-03-24 NOTE — Therapy (Signed)
[facility] [address]  [place], [place], [address] Phone: [phone]   Fax:  [phone]  Physical Therapy Treatment  Patient Details  Name: Carrie Elliott MRN: [id number] Date of Birth: 06-10-1950 Referring Provider (PT): Penni Homans   Encounter Date: 03/24/2021   PT End of Session - 03/24/21 0934     Visit Number 8    Number of Visits 16    Date for PT Re-Evaluation 04/19/21    Authorization Type Medicare + supplement    Progress Note Due on Visit 10    PT Start Time 0930    PT Stop Time 1015    PT Time Calculation (min) 45 min    Activity Tolerance Patient tolerated treatment well   nausea with BPPV testing and treatment   Behavior During Therapy [facility] for tasks assessed/performed             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    Past Surgical History:  Procedure Laterality Date   APPENDECTOMY  1989   atypical nevus excision     BREAST BIOPSY Right    CHOLECYSTECTOMY  1989   COLONOSCOPY WITH PROPOFOL N/A 05/27/2015   Procedure: COLONOSCOPY WITH PROPOFOL;  Surgeon: Clarene Essex, MD;  Location: [facility];  Service: Endoscopy;  Laterality: N/A;   CYSTOSCOPY W/ URETERAL STENT REMOVAL  1999   ESOPHAGOGASTRODUODENOSCOPY  2011   with esophageal stent, perforation    ESOPHAGOGASTRODUODENOSCOPY (EGD) WITH PROPOFOL N/A 05/27/2015   Procedure: ESOPHAGOGASTRODUODENOSCOPY (EGD) WITH PROPOFOL;  Surgeon: Clarene Essex, MD;  Location: [facility];  Service: Endoscopy;  Laterality: N/A;   HEMORRHOID SURGERY  2005   LEFT OOPHORECTOMY  4235   complicated by ureteral injuery requiring a ureteral stent    MMK / ANTERIOR VESICOURETHROPEXY / URETHROPEXY  2000   TONSILLECTOMY AND ADENOIDECTOMY     TOTAL ABDOMINAL HYSTERECTOMY  2000   with USO   TUBAL LIGATION  1981   wisdom teeth removal  1964    There were no vitals filed for this visit.   Subjective Assessment - 03/24/21 0931     Subjective Patient reports she thought she was going to have some dizziness this morning at 4 AM when got into bed and rolled to the right, but she didn't.  She still has popping in R ear.  Noticed that knee does hurt mostly in first hour in morning, but once she gets moving goes away.    Pertinent History history of falls with injury    Limitations Standing;Walking;House hold activities;Other (comment)   shopping, stairs   How long can you walk comfortably? 10-15 minutes    Diagnostic tests 10/14/20- L knee Xrays 1. No fracture or dislocation of the right knee.  2. Mild tricompartmental osteoarthritis.  10/14/20- cervical/head CT- Moderate multilevel degenerative disc disease is noted in the  cervical spine. No acute abnormality is noted.  Patient Stated Goals feel more steady    Currently in Pain? No/denies    Pain Onset Other (comment)    Pain Onset Other (comment)                               [facility] Adult PT Treatment/Exercise - 03/24/21 0001       Knee/Hip Exercises: Machines for Strengthening   Cybex Knee Flexion 15# 2 x 10      Knee/Hip Exercises: Standing   Step Down Both;2 sets;10 reps;Hand Hold: 1;Step Height: 2"    Step Down Limitations dips for eccentric control      Shoulder Exercises: ROM/Strengthening   UBE (Upper Arm Bike) L1.5 x 6 min (3' each fwd & back)      Manual Therapy   Manual Therapy Soft tissue mobilization;Myofascial release    Manual therapy comments to neck and  shoulders in sitting to decrease muscle spasm and headache    Soft tissue mobilization STM to bil UT, levator scapulae, cervical paraspinals    Myofascial Release TPR to R levator             Vestibular Treatment/Exercise - 03/24/21 0001       Vestibular Treatment/Exercise   Canalith Repositioning Epley Manuever Right       EPLEY MANUEVER RIGHT   Number of Reps  2    Overall Response Improved Symptoms    Response Details  rotational upbeating nystagmus first manuever, no symptoms or nystagmus second.                Balance Exercises - 03/24/21 0001       Balance Exercises: Standing   Standing Eyes Opened Foam/compliant surface;Head turns;Limitations    Standing Eyes Opened Limitations on airex eyes open x 30 sec, head turns x 10, head nods x 10, in corner with SBA for safety    Standing Eyes Closed Head turns;Foam/compliant surface;Limitations    Standing Eyes Closed Limitations on airex eyes closed x 30 sec, head turns x 10, head nods x 10, in corner with SBA for safety, increased sway with head movements, tactile cues to decrease sway as needed to prevent LOB                  PT Short Term Goals - 02/28/21 1234       PT SHORT TERM GOAL #1   Title Pt. will be evaluated for balance.    Time 2    Period Weeks    Status Achieved   02/28/21 - completed FGA and mCTSIB   Target Date 03/08/21               PT Long Term Goals - 03/22/21 1217       PT LONG TERM GOAL #1   Title Patient to be independent with advanced HEP to improve outcomes    Time 8    Period Weeks    Status On-going    Target Date 04/19/21      PT LONG TERM GOAL #2   Title Patient will demonstrate resolution of BPPV symptoms.    Time 8    Period Weeks    Status Achieved   02/28/21- negative dix-hallpike bil   Target Date 04/19/21      PT LONG TERM GOAL #3   Title Patient will be able to ascend/descend stairs without increased L knee pain.    Time 8    Period Weeks  Status  On-going   02/28/21- no knee pain with stairs today   Target Date 04/19/21      PT LONG TERM GOAL #4   Title Patient will demonstrate improved cervical ROM by 10 deg all directions to improve functional mobility.    Baseline see flowsheet    Time 8    Period Weeks    Status On-going   03/22/21- progress, 40 to 50 deg flexion, 35 to 40 deg extension, 60 deg R rotation (no change), 50 to 60 deg L rotation   Target Date 04/19/21      PT LONG TERM GOAL #5   Title Patient will be able to look upwards without limitation from dizziness to shop.    Baseline dizziness with prolonged neck extension.    Time 8    Period Weeks    Status On-going   03/13/21- no dizziness with looking up for shorter periods.  03/22/21- has not noted dizziness with shopping now   Target Date 04/19/21                   Plan - 03/24/21 1158     Clinical Impression Statement Carrie Elliott reported feeling onset of dizziness with rolling to R in bed, so rested for R sided BPPV, positive today but after canalith repositioning no symptoms and no nystagmus with retest.  She demonstrated improved steadiness with head turns/eyes closed while on airex, but still had some sway, needed less tactile cues to correct.  She still demonstrates significant tightness in R cervical paraspinals/UT/LS.  She would benefit from continued skilled physical therapy.    Personal Factors and Comorbidities Comorbidity 3+    Comorbidities falls with injury, vertigo, BPPV, osteoarthritis, HTN, chronic L knee pain    Examination-Activity Limitations Bed Mobility;Transfers;Locomotion Level;Stairs;Reach Overhead    Examination-Participation Restrictions Cleaning;Shop;Community Activity;Occupation    Stability/Clinical Decision Making Evolving/Moderate complexity    Rehab Potential Good    PT Frequency 2x / week    PT Duration 8 weeks    PT Treatment/Interventions ADLs/Self Care Home Management;Cryotherapy;Electrical Stimulation;Moist  Heat;Iontophoresis 4mg /ml Dexamethasone;Ultrasound;Gait training;Stair training;Functional mobility training;Therapeutic activities;Therapeutic exercise;Balance training;Neuromuscular re-education;Patient/family education;Manual techniques;Passive range of motion;Dry needling;Taping;Vestibular;Spinal Manipulations;Joint Manipulations;Canalith Repostioning    PT Next Visit Plan stepping strategy, manual therapy as needed.  Recheck for BPPV both canals.    PT Home Exercise Plan Access Code: [email or web address]    Consulted and Agree with Plan of Care Patient             Patient will benefit from skilled therapeutic intervention in order to improve the following deficits and impairments:  Decreased balance, Decreased endurance, Decreased mobility, Difficulty walking, Increased muscle spasms, Decreased range of motion, Dizziness, Impaired perceived functional ability, Decreased activity tolerance, Decreased safety awareness, Decreased strength, Impaired flexibility, Postural dysfunction, Pain  Visit Diagnosis: Disequilibrium  Left knee pain, unspecified chronicity  Recurrent falls  Unsteady gait  Abnormal posture     Problem List 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  [place], [place], [address] Phone: [phone]   Fax:  [phone]  Name: Carrie Elliott MRN: [id number] Date of Birth: Jan 16, 1951

## 2021-03-30 ENCOUNTER — Encounter: Payer: Self-pay | Admitting: Family

## 2021-03-30 ENCOUNTER — Other Ambulatory Visit: Payer: Self-pay | Admitting: Family Medicine

## 2021-03-30 ENCOUNTER — Encounter: Payer: Self-pay | Admitting: Physical Therapy

## 2021-03-30 ENCOUNTER — Ambulatory Visit: Payer: Medicare Other | Attending: Family Medicine | Admitting: Physical Therapy

## 2021-03-30 ENCOUNTER — Other Ambulatory Visit (HOSPITAL_BASED_OUTPATIENT_CLINIC_OR_DEPARTMENT_OTHER): Payer: Self-pay

## 2021-03-30 ENCOUNTER — Other Ambulatory Visit: Payer: Self-pay

## 2021-03-30 DIAGNOSIS — M25562 Pain in left knee: Secondary | ICD-10-CM

## 2021-03-30 DIAGNOSIS — R293 Abnormal posture: Secondary | ICD-10-CM

## 2021-03-30 DIAGNOSIS — R2681 Unsteadiness on feet: Secondary | ICD-10-CM | POA: Diagnosis not present

## 2021-03-30 DIAGNOSIS — R42 Dizziness and giddiness: Secondary | ICD-10-CM

## 2021-03-30 DIAGNOSIS — R296 Repeated falls: Secondary | ICD-10-CM

## 2021-03-30 MED ORDER — METOPROLOL SUCCINATE ER 50 MG PO TB24
ORAL_TABLET | ORAL | 1 refills | Status: DC
  Filled 2021-03-30: qty 360, 90d supply, fill #0
  Filled 2021-09-28: qty 360, 90d supply, fill #1

## 2021-03-30 NOTE — Therapy (Signed)
[facility] [address]  [place], [place], [address] Phone: [phone]   Fax:  [phone]  Physical Therapy Treatment  Patient Details  Name: Carrie Elliott MRN: [id number] Date of Birth: [birth year] Referring Provider (PT): Penni Homans   Encounter Date: 03/30/2021   PT End of Session - 03/30/21 0952     Visit Number 9    Number of Visits 16    Date for PT Re-Evaluation 04/19/21    Authorization Type Medicare + supplement    Progress Note Due on Visit 10    PT Start Time 0935    PT Stop Time 1020    PT Time Calculation (min) 45 min    Activity Tolerance Patient tolerated treatment well   nausea with BPPV testing and treatment   Behavior During Therapy [facility] for tasks assessed/performed             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    Past Surgical History:  Procedure Laterality Date   APPENDECTOMY  1989   atypical nevus excision     BREAST BIOPSY Right    CHOLECYSTECTOMY  1989   COLONOSCOPY WITH PROPOFOL N/A 05/27/2015   Procedure: COLONOSCOPY WITH PROPOFOL;  Surgeon: Clarene Essex, MD;  Location: [facility];  Service: Endoscopy;  Laterality: N/A;   CYSTOSCOPY W/ URETERAL STENT REMOVAL  1999   ESOPHAGOGASTRODUODENOSCOPY  2011   with esophageal stent, perforation    ESOPHAGOGASTRODUODENOSCOPY (EGD) WITH PROPOFOL N/A 05/27/2015   Procedure: ESOPHAGOGASTRODUODENOSCOPY (EGD) WITH PROPOFOL;  Surgeon: Clarene Essex, MD;  Location: [facility];  Service: Endoscopy;  Laterality: N/A;   HEMORRHOID SURGERY  2005   LEFT OOPHORECTOMY  4818   complicated by ureteral injuery requiring a ureteral stent    MMK / ANTERIOR VESICOURETHROPEXY / URETHROPEXY  2000   TONSILLECTOMY AND ADENOIDECTOMY     TOTAL ABDOMINAL HYSTERECTOMY  2000   with USO   TUBAL LIGATION  1981   wisdom teeth removal  1964    There were no vitals filed for this visit.   Subjective Assessment - 03/30/21 0945     Subjective Patient reports dizziness much better, no spinning episodes.  R hip bothering her today.  Still getting popping in R ear.    Pertinent History history of falls with injury    Limitations Standing;Walking;House hold activities;Other (comment)   shopping, stairs   How long can you walk comfortably? 10-15 minutes    Diagnostic tests 10/14/20- L knee Xrays 1. No fracture or dislocation of the right knee.  2. Mild tricompartmental osteoarthritis.  10/14/20- cervical/head CT- Moderate multilevel degenerative disc disease is noted in the  cervical spine. No acute abnormality is noted.    Patient Stated Goals feel more steady    Currently in Pain? Yes    Pain Score 7     Pain Location Hip    Pain Orientation Right  Pain Onset Other (comment)    Pain Onset Other (comment)                     Vestibular Assessment - 03/30/21 0001       Dix-Hallpike Right   Dix-Hallpike Right Duration 0    Dix-Hallpike Right Symptoms No nystagmus      Dix-Hallpike Left   Dix-Hallpike Left Duration 0    Dix-Hallpike Left Symptoms No nystagmus                      [facility] Adult PT Treatment/Exercise - 03/30/21 0001       Knee/Hip Exercises: Supine   Bridges Strengthening;Both;2 sets;10 reps    Other Supine Knee/Hip Exercises supine ball squeeze 2 x 10 with 5 sec hold and cues for TrA activation      Knee/Hip Exercises: Sidelying   Clams 2 x 10 on R side, no resistance.  Not performed on L today due to  R hip pain with pressure      Manual Therapy   Manual Therapy Soft tissue mobilization;Myofascial release;Joint mobilization    Manual therapy comments to neck and shoulders in sitting to decrease muscle spasm and headache    Joint Mobilization PA mobs cervical spine, NAGs into rotation    Soft tissue mobilization STM to cervical paraspinals    Myofascial Release TPR to R levator                       PT Short Term Goals - 02/28/21 1234       PT SHORT TERM GOAL #1   Title Pt. will be evaluated for balance.    Time 2    Period Weeks    Status Achieved   02/28/21 - completed FGA and mCTSIB   Target Date 03/08/21               PT Long Term Goals - 03/30/21 1035       PT LONG TERM GOAL #1   Title Patient to be independent with advanced HEP to improve outcomes    Time 8    Period Weeks    Status On-going    Target Date 04/19/21      PT LONG TERM GOAL #2   Title Patient will demonstrate resolution of BPPV symptoms.    Time 8    Period Weeks    Status Achieved   02/28/21- negative dix-hallpike bil  03/30/21- neg dix-hallpike bil   Target Date 04/19/21      PT LONG TERM GOAL #3   Title Patient will be able to ascend/descend stairs without increased L knee pain.    Time 8    Period Weeks    Status On-going   02/28/21- no knee pain with stairs today   Target Date 04/19/21      PT LONG TERM GOAL #4   Title Patient will demonstrate improved cervical ROM by 10 deg all directions to improve functional mobility.    Baseline see flowsheet    Time 8    Period Weeks    Status On-going   03/22/21- progress, 40 to 50 deg flexion, 35 to 40 deg extension, 60 deg R rotation (no change), 50 to 60 deg L rotation   Target Date 04/19/21      PT LONG TERM GOAL #5   Title Patient will be able to look upwards without limitation from dizziness to shop.    Baseline  dizziness with prolonged neck extension.    Time 8    Period Weeks    Status On-going   03/13/21- no dizziness  with looking up for shorter periods.  03/22/21- has not noted dizziness with shopping now   Target Date 04/19/21                   Plan - 03/30/21 1032     Clinical Impression Statement Anniah reports no dizziness this week, and recheck of both posterior canals demonstrated resolution of BPPV.  Her knee pain is improving, more bothered by R hip pain today, so focused on hip strengthening exercises in supine to improve glut/extensor strength for balance.  Tolerated well and reported decreased hip pain with exercises, so added to HEP.  She still has popping in her R ear, but no tightness/tenderness in TMJ muscles.  Still had palpable tightness/trigger points in R cervical paraspinals, after manual therapy she did report decreased popping in ear. She would benefit from continued skilled therapy.    Personal Factors and Comorbidities Comorbidity 3+    Comorbidities falls with injury, vertigo, BPPV, osteoarthritis, HTN, chronic L knee pain    Examination-Activity Limitations Bed Mobility;Transfers;Locomotion Level;Stairs;Reach Overhead    Examination-Participation Restrictions Cleaning;Shop;Community Activity;Occupation    Stability/Clinical Decision Making Evolving/Moderate complexity    Rehab Potential Good    PT Frequency 2x / week    PT Duration 8 weeks    PT Treatment/Interventions ADLs/Self Care Home Management;Cryotherapy;Electrical Stimulation;Moist Heat;Iontophoresis 4mg /ml Dexamethasone;Ultrasound;Gait training;Stair training;Functional mobility training;Therapeutic activities;Therapeutic exercise;Balance training;Neuromuscular re-education;Patient/family education;Manual techniques;Passive range of motion;Dry needling;Taping;Vestibular;Spinal Manipulations;Joint Manipulations;Canalith Repostioning    PT Next Visit Plan stepping strategy, manual therapy as needed.  Recheck for BPPV both canals.    PT Home Exercise Plan Access Code: [email or web address]    Consulted and Agree with Plan of Care  Patient             Patient will benefit from skilled therapeutic intervention in order to improve the following deficits and impairments:  Decreased balance, Decreased endurance, Decreased mobility, Difficulty walking, Increased muscle spasms, Decreased range of motion, Dizziness, Impaired perceived functional ability, Decreased activity tolerance, Decreased safety awareness, Decreased strength, Impaired flexibility, Postural dysfunction, Pain  Visit Diagnosis: Disequilibrium  Left knee pain, unspecified chronicity  Recurrent falls  Unsteady gait  Abnormal posture     Problem List 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  [place], [place], [address] Phone: [phone]   Fax:  [phone]  Name: Carrie Elliott MRN: [id number] Date of Birth: 19-Jul-1950

## 2021-04-03 ENCOUNTER — Encounter: Payer: Self-pay | Admitting: Physical Therapy

## 2021-04-03 ENCOUNTER — Ambulatory Visit: Payer: Medicare Other | Admitting: Physical Therapy

## 2021-04-03 ENCOUNTER — Other Ambulatory Visit (HOSPITAL_BASED_OUTPATIENT_CLINIC_OR_DEPARTMENT_OTHER): Payer: Self-pay

## 2021-04-03 ENCOUNTER — Other Ambulatory Visit: Payer: Self-pay

## 2021-04-03 DIAGNOSIS — R2681 Unsteadiness on feet: Secondary | ICD-10-CM

## 2021-04-03 DIAGNOSIS — R296 Repeated falls: Secondary | ICD-10-CM | POA: Diagnosis not present

## 2021-04-03 DIAGNOSIS — R293 Abnormal posture: Secondary | ICD-10-CM

## 2021-04-03 DIAGNOSIS — M25562 Pain in left knee: Secondary | ICD-10-CM | POA: Diagnosis not present

## 2021-04-03 DIAGNOSIS — R42 Dizziness and giddiness: Secondary | ICD-10-CM

## 2021-04-03 NOTE — Therapy (Signed)
[facility] [address]  [place], [place], [address] Phone: [phone]   Fax:  [phone]  Physical Therapy Treatment Progress Note Reporting Period 02/22/2021  to 04/03/2021  See note below for Objective Data and Assessment of Progress/Goals.     Patient Details  Name: Carrie Elliott MRN: [id number] Date of Birth: 27-Aug-1950 Referring Provider (PT): Penni Homans   Encounter Date: 04/03/2021   PT End of Session - 04/03/21 1535     Visit Number 10    Number of Visits 16    Date for PT Re-Evaluation 04/19/21    Authorization Type Medicare + supplement    Progress Note Due on Visit 10    PT Start Time 1533    PT Stop Time 1615    PT Time Calculation (min) 42 min    Activity Tolerance Patient tolerated treatment well   nausea with BPPV testing and treatment   Behavior During Therapy 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    Past Surgical History:  Procedure Laterality Date   APPENDECTOMY  1989   atypical nevus excision     BREAST BIOPSY Right    CHOLECYSTECTOMY  1989   COLONOSCOPY WITH PROPOFOL N/A 05/27/2015   Procedure: COLONOSCOPY WITH PROPOFOL;  Surgeon: Clarene Essex, MD;  Location: [facility];  Service: Endoscopy;  Laterality: N/A;   CYSTOSCOPY W/ URETERAL STENT REMOVAL  1999   ESOPHAGOGASTRODUODENOSCOPY  2011   with esophageal stent, perforation     ESOPHAGOGASTRODUODENOSCOPY (EGD) WITH PROPOFOL N/A 05/27/2015   Procedure: ESOPHAGOGASTRODUODENOSCOPY (EGD) WITH PROPOFOL;  Surgeon: Clarene Essex, MD;  Location: [facility];  Service: Endoscopy;  Laterality: N/A;   HEMORRHOID SURGERY  2005   LEFT OOPHORECTOMY  3646   complicated by ureteral injuery requiring a ureteral stent    MMK / ANTERIOR VESICOURETHROPEXY / URETHROPEXY  2000   TONSILLECTOMY AND ADENOIDECTOMY     TOTAL ABDOMINAL HYSTERECTOMY  2000   with USO   TUBAL LIGATION  1981   wisdom teeth removal  1964    There were no vitals filed for this visit.   Subjective Assessment - 04/03/21 1536     Subjective Slight pain L knee and R shoulder after taking Christmas decorations down (reaching on ladder).  No episodes of dizziness.    Pertinent History history of falls with injury    Limitations Standing;Walking;House hold activities;Other (comment)   shopping, stairs   How long can you walk comfortably? 10-15 minutes    Diagnostic tests 10/14/20- L knee Xrays 1. No fracture or dislocation of the right knee.  2. Mild tricompartmental osteoarthritis.  10/14/20- cervical/head CT- Moderate multilevel degenerative disc disease is noted in the  cervical spine. No acute abnormality is noted.    Patient Stated Goals feel more steady    Currently in Pain?  Yes    Pain Score 3     Pain Location Knee    Pain Orientation Left    Pain Onset Other (comment)    Pain Onset Other (comment)                [facility] PT Assessment - 04/03/21 0001       Assessment   Medical Diagnosis M25.562 (ICD-10-CM) - Left knee pain, unspecified chronicity  R26.81 (ICD-10-CM) - Unsteady gait  R42 (ICD-10-CM) - Disequilibrium  R29.6 (ICD-10-CM) - Recurrent falls    Referring Provider (PT) Penni Homans      Observation/Other Assessments   Focus on Therapeutic Outcomes (FOTO)  Knee 62      AROM   Cervical Flexion 50    Cervical Extension 50    Cervical - Right Rotation 70    Cervical - Left Rotation 70                            [facility] Adult PT Treatment/Exercise - 04/03/21 0001       Knee/Hip Exercises: Aerobic   Nustep L5 x 6 min      Knee/Hip Exercises: Standing   Lateral Step Up Both;2 sets;10 reps;Hand Hold: 1;Step Height: 6"    Lateral Step Up Limitations 1 riser on step, cues for alignment    Forward Step Up 10 reps;Both;2 sets;Hand Hold: 1;Step Height: 6"    Forward Step Up Limitations 1 riser on step, cues    Step Down Both;2 sets;10 reps;Hand Hold: 1;Step Height: 2"    Step Down Limitations dips for eccentric control      Modalities   Modalities Ultrasound      Ultrasound   Ultrasound Location L medial knee    Ultrasound Parameters 3.3 MHz, 1.2w/cm2, cont, x 8 min to pes anserine    Ultrasound Goals Pain                       PT Short Term Goals - 02/28/21 1234       PT SHORT TERM GOAL #1   Title Pt. will be evaluated for balance.    Time 2    Period Weeks    Status Achieved   02/28/21 - completed FGA and mCTSIB   Target Date 03/08/21               PT Long Term Goals - 04/03/21 1539       PT LONG TERM GOAL #1   Title Patient to be independent with advanced HEP to improve outcomes    Time 8    Period Weeks    Status On-going   04/03/20- met for current   Target Date 04/19/21      PT LONG TERM GOAL #2   Title Patient will demonstrate resolution of BPPV symptoms.    Time 8    Period Weeks    Status Achieved   02/28/21- negative dix-hallpike bil  03/30/21- neg dix-hallpike bil   Target Date 04/19/21      PT LONG TERM GOAL #3   Title Patient will be able to ascend/descend stairs without increased L knee pain.    Time 8    Period Weeks    Status On-going   02/28/21- no knee pain with stairs today   Target Date 04/19/21      PT LONG TERM GOAL #4   Title Patient will demonstrate improved cervical ROM by 10 deg all  directions to improve functional mobility.    Baseline see flowsheet    Time 8    Period Weeks    Status  Achieved   03/22/21- progress, 40 to 50 deg flexion, 35 to 40 deg extension, 60 deg R rotation (no change), 50 to 60 deg L rotation 04/03/2020- 50 deg flexion and extension, 70 deg rotation bil   Target Date 04/19/21      PT LONG TERM GOAL #5   Title Patient will be able to look upwards without limitation from dizziness to shop.    Baseline dizziness with prolonged neck extension.    Time 8    Period Weeks    Status On-going   03/13/21- no dizziness with looking up for shorter periods.  03/22/21- has not noted dizziness with shopping now 04/03/2021- 1 brief episode last week looking up   Target Date 04/19/21                   Plan - 04/03/21 1808     Clinical Impression Statement Carrie Elliott reports no episodes of dizziness this week.  She reports some L knee pain today provoked mostly by climbing ladder at home.   She still tends to avoid stairs at home due to knee pain.  Her FOTO has improved slightly to 62% (only 64% expected at discharge), but her cervical ROM has improved singificantly meeting LTG #4.  Continued LE strengthening today for stairs, and then [place] to L medial knee at pes anserine to decrease pain and tenderness.  Pt. would benefit from continued skilled therapy.    Personal Factors and Comorbidities Comorbidity 3+    Comorbidities falls with injury, vertigo, BPPV, osteoarthritis, HTN, chronic L knee pain    Examination-Activity Limitations Bed Mobility;Transfers;Locomotion Level;Stairs;Reach Overhead    Examination-Participation Restrictions Cleaning;Shop;Community Activity;Occupation    Stability/Clinical Decision Making Evolving/Moderate complexity    Rehab Potential Good    PT Frequency 2x / week    PT Duration 8 weeks    PT Treatment/Interventions ADLs/Self Care Home Management;Cryotherapy;Electrical Stimulation;Moist Heat;Iontophoresis 86m/ml Dexamethasone;Ultrasound;Gait training;Stair training;Functional mobility training;Therapeutic activities;Therapeutic  exercise;Balance training;Neuromuscular re-education;Patient/family education;Manual techniques;Passive range of motion;Dry needling;Taping;Vestibular;Spinal Manipulations;Joint Manipulations;Canalith Repostioning    PT Next Visit Plan stepping strategy, manual therapy as needed.  Recheck for BPPV both canals.    PT Home Exercise Plan Access Code: [email or web address]    Consulted and Agree with Plan of Care Patient             Patient will benefit from skilled therapeutic intervention in order to improve the following deficits and impairments:  Decreased balance, Decreased endurance, Decreased mobility, Difficulty walking, Increased muscle spasms, Decreased range of motion, Dizziness, Impaired perceived functional ability, Decreased activity tolerance, Decreased safety awareness, Decreased strength, Impaired flexibility, Postural dysfunction, Pain  Visit Diagnosis: Disequilibrium  Left knee pain, unspecified chronicity  Recurrent falls  Unsteady gait  Abnormal posture     Problem List 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   History of colonic polyps 09/30/2015   Gastric polyps 09/30/2015   Hip pain 09/05/2014  Urinary frequency 09/05/2014   Preventative health care 09/05/2014   Sun-damaged skin 09/05/2014   Myalgia and myositis 02/11/2014   Benign paroxysmal positional vertigo 07/12/2013   HTN (hypertension) 04/23/2013   Nodule of left lung 03/30/2011   Allergic rhinitis    Arthritis    Gastroesophageal reflux disease with hiatal  hernia     Rennie Natter, PT, DPT  04/03/2021, 6:13 PM  [facility] [address]  [place], [place], [address] Phone: [phone]   Fax:  [phone]  Name: Carrie Elliott MRN: [id number] Date of Birth: [birth year]

## 2021-04-04 ENCOUNTER — Encounter: Payer: Medicare Other | Admitting: Physical Therapy

## 2021-04-06 ENCOUNTER — Encounter: Payer: Self-pay | Admitting: Physical Therapy

## 2021-04-06 ENCOUNTER — Ambulatory Visit: Payer: Medicare Other | Admitting: Physical Therapy

## 2021-04-06 ENCOUNTER — Other Ambulatory Visit: Payer: Self-pay

## 2021-04-06 DIAGNOSIS — R293 Abnormal posture: Secondary | ICD-10-CM | POA: Diagnosis not present

## 2021-04-06 DIAGNOSIS — R42 Dizziness and giddiness: Secondary | ICD-10-CM | POA: Diagnosis not present

## 2021-04-06 DIAGNOSIS — R296 Repeated falls: Secondary | ICD-10-CM

## 2021-04-06 DIAGNOSIS — M25562 Pain in left knee: Secondary | ICD-10-CM

## 2021-04-06 DIAGNOSIS — R2681 Unsteadiness on feet: Secondary | ICD-10-CM

## 2021-04-06 NOTE — Progress Notes (Signed)
[facility] New Patient Evaluation and Consultation  Referring Provider: Megan Salon, MD PCP: Mosie Lukes, MD Date of Service: 04/07/2021  SUBJECTIVE Chief Complaint: New Patient (Initial Visit) (prolapse)  History of Present Illness: Carrie Elliott is a 71 y.o. White or Caucasian female seen in consultation at the request of Dr. Sabra Heck for evaluation of prolapse.    Review of records from Dr Sabra Heck significant for: S/p TAH, USO and MMK procedure. Has some mild urinary leakage. Has to double void to empty bladder. Cystocele noted on exam.   Urinary Symptoms: Leaks urine with cough/ sneeze and with urgency. Leakage is usually only if bladder is full, but not as bad as it was before the procedure.  Leaks 0-1 time(s) per day.  Pad use: none She is not bothered by her UI symptoms.  Day time voids 6-8.  Nocturia: 2 times per night to void. Voiding dysfunction: she does not empty her bladder well.  does not use a catheter to empty bladder.  When urinating, she feels the need to urinate multiple times in a row  UTIs:  0  UTI's in the last year.   Denies history of blood in urine and kidney or bladder stones  Pelvic Organ Prolapse Symptoms:                  She Admits to a feeling of a bulge the vaginal area. It has been present for several years, worse with activity.  She Admits to seeing a bulge.  This bulge is bothersome.  Bowel Symptom: Bowel movements: 1-3 time(s) per day Stool consistency: soft  Straining: no.  Splinting: no.  Incomplete evacuation: no.  She Denies accidental bowel leakage / fecal incontinence Bowel regimen: none Last colonoscopy: n/a  Sexual Function Sexually active: yes.  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   Osteopenia 10/09/2015   Sternal fracture    1986   Vitamin D deficiency 04/06/2016   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     Past Surgical History:   Past Surgical History:  Procedure Laterality Date   APPENDECTOMY  1989   atypical nevus excision     BREAST BIOPSY Right    CHOLECYSTECTOMY  1989   COLONOSCOPY WITH PROPOFOL N/A 05/27/2015   Procedure: COLONOSCOPY WITH PROPOFOL;  Surgeon: Clarene Essex, MD;  Location: [facility];  Service: Endoscopy;  Laterality: N/A;   CYSTOSCOPY W/ URETERAL STENT REMOVAL  1999   ESOPHAGOGASTRODUODENOSCOPY  2011   with esophageal stent, perforation    ESOPHAGOGASTRODUODENOSCOPY (EGD) WITH PROPOFOL N/A 05/27/2015   Procedure: ESOPHAGOGASTRODUODENOSCOPY (EGD) WITH PROPOFOL;  Surgeon: Clarene Essex, MD;  Location: [facility];  Service: Endoscopy;  Laterality: N/A;   HEMORRHOID SURGERY  2005   LEFT OOPHORECTOMY  6440   complicated by ureteral injuery requiring a ureteral stent    MMK / ANTERIOR VESICOURETHROPEXY / URETHROPEXY  2000   TONSILLECTOMY AND ADENOIDECTOMY     TOTAL ABDOMINAL HYSTERECTOMY  2000   with USO   TUBAL LIGATION  1981   wisdom teeth removal  1964     Past OB/GYN History: OB History  Gravida Para Term Preterm AB Living  2 2 2     2   SAB IAB Ectopic Multiple Live Births          2    # Outcome Date GA Lbr Len/2nd Weight Sex Delivery Anes PTL Lv  2 Term 08/30/79   9 lb 1 oz (4.111 kg) M Vag-Spont   LIV  1 Term 05/02/78   8 lb 15 oz (4.054 kg) F Vag-Spont   LIV   S/p hysterectomy- abdominal and MMK procedure -Had an ovarian cystectomy. Needed a ureteral stent placement after this procedure).    Medications: She has a current medication list which  includes the following prescription(s): benzocaine, vitamin d3, esomeprazole, fluticasone, hydrochlorothiazide, lidocaine-hydrocortisone ace, lisinopril, metoprolol succinate, ondansetron, saline, and famotidine.   Allergies: Patient is allergic to pseudoephedrine hcl er, singulair [montelukast sodium], cefdinir, celebrex [celecoxib], ciprofloxacin, and codeine.   Social History:  Social History   Tobacco Use   Smoking status: Never   Smokeless tobacco: Never  Vaping Use   Vaping Use: Never used  Substance Use Topics   Alcohol use: No   Drug use: No    Relationship status: married She lives with husband.   She is employed as an Therapist, sports part time. Regular exercise: No History of abuse: No  Family History:   Family History  Problem Relation Age of Onset   Colon polyps Father    Hypertension Father    Heart failure Father    Melanoma Father        multiple   Heart attack Father        x2   Heart disease Father        Bundle Block   Meniere's disease Father    Cancer Father        melanoma   Osteoporosis Mother    Transient ischemic attack Mother    Cancer Mother        colon   Heart disease Mother        afib   Hyperlipidemia Mother    Diabetes Paternal Grandmother    Stroke Maternal Grandmother    Cancer Maternal Grandmother        skin, melanoma, sarcoma   Colon polyps Maternal Grandmother    Diverticulitis Maternal Grandmother    Stroke Maternal Grandfather    Heart disease Maternal Grandfather    Obesity Brother      Review of Systems: Review of Systems  Constitutional:  Negative for fever, malaise/fatigue and weight loss.  Respiratory:  Negative for cough, shortness of breath and wheezing.   Cardiovascular:  Negative for chest pain, palpitations and leg swelling.  Gastrointestinal:  Negative for abdominal pain and blood in stool.  Genitourinary:  Negative for dysuria.  Musculoskeletal:  Negative for myalgias.  Skin:  Negative for rash.  Neurological:   Negative for dizziness and headaches.  Endo/Heme/Allergies:  Does not bruise/bleed easily.  Psychiatric/Behavioral:  Negative for depression. The patient is not nervous/anxious.     OBJECTIVE Physical Exam: Vitals:   04/07/21 0951  BP: 136/69  Pulse: 67  Weight: 167 lb (75.8 kg)  Height: 5' 1.5" (1.562 m)    Physical Exam Constitutional:      General: She is not in acute distress. Pulmonary:     Effort: Pulmonary effort is normal.  Abdominal:  General: There is no distension.     Palpations: Abdomen is soft.     Tenderness: There is no abdominal tenderness. There is no rebound.  Musculoskeletal:        General: No swelling. Normal range of motion.  Skin:    General: Skin is warm and dry.     Findings: No rash.  Neurological:     Mental Status: She is alert and oriented to person, place, and time.  Psychiatric:        Mood and Affect: Mood normal.        Behavior: Behavior normal.     GU / Detailed Urogynecologic Evaluation:  Pelvic Exam: Normal external female genitalia; Bartholin's and Skene's glands normal in appearance; urethral meatus normal in appearance, no urethral masses or discharge.   CST: negative  s/p hysterectomy: Speculum exam reveals normal vaginal mucosa with  atrophy and normal vaginal cuff.  Adnexa no mass, fullness, tenderness.    Pelvic floor strength I/V,   Pelvic floor musculature: Right levator non-tender, Right obturator non-tender, Left levator non-tender, Left obturator non-tender  POP-Q:   POP-Q  0                                            Aa   0                                           Ba  -7                                              C   2                                            Gh  3                                            Pb  8                                            tvl   -2                                            Ap  -2                                            Bp  D     Rectal Exam:  Normal external rectum  Post-Void Residual (PVR) by Bladder Scan: In order to evaluate bladder emptying, we discussed obtaining a postvoid residual and she agreed to this procedure.  Procedure: The ultrasound unit was placed on the patient's abdomen in the suprapubic region after the patient had voided. A PVR of 59 ml was obtained by bladder scan.  Laboratory Results: Unable to provide urine sample  ASSESSMENT AND PLAN Ms. Vandermeulen is a 71 y.o. with:  1. Urinary frequency   2. SUI (stress urinary incontinence, female)   3. Prolapse of anterior vaginal wall    Stage II anterior, Stage I posterior, Stage I apical prolapse - For treatment of pelvic organ prolapse, we discussed options for management including expectant management, conservative management, and surgical management, such as Kegels, a pessary, pelvic floor physical therapy, and specific surgical procedures. - She would like to try physical therapy first, referral placed. If she does not see improvement, then is interested in surgery (anterior repair).   2. Mixed incontinence - Does not have frequent symptoms.  - We discussed that pelvic PT can help with pelvic floor strengthening and decrease leakage.  - if she is interested in surgery, would need UDS due to her history of MMK.   Return 3 months   Jaquita Folds, MD

## 2021-04-06 NOTE — Therapy (Signed)
[facility] [address]  [place], [place], [address] Phone: [phone]   Fax:  [phone]  Physical Therapy Treatment  Patient Details  Name: Carrie Elliott MRN: [id number] Date of Birth: 1950/11/10 Referring Provider (PT): Penni Homans   Encounter Date: 04/06/2021   PT End of Session - 04/06/21 0938     Visit Number 11    Number of Visits 16    Date for PT Re-Evaluation 04/19/21    Authorization Type Medicare + supplement    Progress Note Due on Visit 10    PT Start Time 0935    PT Stop Time 1015    PT Time Calculation (min) 40 min    Activity Tolerance Patient tolerated treatment well   nausea with BPPV testing and treatment   Behavior During Therapy [facility] for tasks assessed/performed             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    Past Surgical History:  Procedure Laterality Date   APPENDECTOMY  1989   atypical nevus excision     BREAST BIOPSY Right    CHOLECYSTECTOMY  1989   COLONOSCOPY WITH PROPOFOL N/A 05/27/2015   Procedure: COLONOSCOPY WITH PROPOFOL;  Surgeon: Clarene Essex, MD;  Location: [facility];  Service: Endoscopy;  Laterality: N/A;   CYSTOSCOPY W/ URETERAL STENT REMOVAL  1999   ESOPHAGOGASTRODUODENOSCOPY  2011   with esophageal stent, perforation    ESOPHAGOGASTRODUODENOSCOPY (EGD) WITH PROPOFOL N/A 05/27/2015   Procedure: ESOPHAGOGASTRODUODENOSCOPY (EGD) WITH PROPOFOL;  Surgeon: Clarene Essex, MD;  Location: [facility];  Service: Endoscopy;  Laterality: N/A;   HEMORRHOID SURGERY  2005   LEFT OOPHORECTOMY  4235   complicated by ureteral injuery requiring a ureteral stent    MMK / ANTERIOR VESICOURETHROPEXY / URETHROPEXY  2000   TONSILLECTOMY AND ADENOIDECTOMY     TOTAL ABDOMINAL HYSTERECTOMY  2000   with USO   TUBAL LIGATION  1981   wisdom teeth removal  1964    There were no vitals filed for this visit.   Subjective Assessment - 04/06/21 0937     Subjective Pt. reports no dizziness this week.  Gets occasional L knee and hip pain still "I think its my lot in life now"    Pertinent History history of falls with injury    Limitations Standing;Walking;House hold activities;Other (comment)   shopping, stairs   How long can you walk comfortably? 10-15 minutes    Diagnostic tests 10/14/20- L knee Xrays 1. No fracture or dislocation of the right knee.  2. Mild tricompartmental osteoarthritis.  10/14/20- cervical/head CT- Moderate multilevel degenerative disc disease is noted in the  cervical spine. No acute abnormality is noted.    Patient Stated Goals feel more steady    Currently in Pain? Yes    Pain Score 2     Pain Location Knee    Pain Orientation  Left    Pain Onset Other (comment)    Pain Onset Other (comment)                               [facility] Adult PT Treatment/Exercise - 04/06/21 0001       Knee/Hip Exercises: Aerobic   Nustep L5 x 6 min                 Balance Exercises - 04/06/21 0001       Balance Exercises: Standing   SLS with Vectors Limitations    SLS with Vectors Limitations tapping cones through 60-120 deg range, starting with tap and return, progressing to tapping 2 cones while maintaing SLS.  Mirror for posture, SBA for safety.  No LOB.    Standing, One Foot on a Step Foam/compliant surface;Head turns;Limitations    Standing, One Foot on a Step Limitations standing with one foot on step with vertical head nods x 10  bil, close SBA for safety.  Alternating toe taps on stool while on compliance surface x 10 bil.  No LOB    Stepping Strategy Anterior;Posterior;Lateral;10 reps;Limitations    Stepping Strategy Limitations stepping with arm movements (raising both with foward step and return 2 x 10 bil, crossed with back step and return x 10 bil, reaching to side with side step and return x 10 bil, then combination foward and backward stepping strategy x 10 bil).  SBA for safety, no LOB.    Sidestepping --    Lift / Chop --    Lift / Chop Limitations --    Other Standing Exercises standing on compliant surface reaching for weighted ball (2000g yellow) outside BOS all directions.  SBA for safety, no LOB                  PT Short Term Goals - 02/28/21 1234       PT SHORT TERM GOAL #1   Title Pt. will be evaluated for balance.    Time 2    Period Weeks    Status Achieved   02/28/21 - completed FGA and mCTSIB   Target Date 03/08/21               PT Long Term Goals - 04/03/21 1539       PT LONG TERM GOAL #1   Title Patient to be independent with advanced HEP to improve outcomes    Time 8    Period Weeks    Status On-going   04/03/20- met for current   Target Date 04/19/21      PT LONG TERM GOAL #2   Title Patient will demonstrate resolution of BPPV symptoms.    Time 8    Period Weeks    Status Achieved   02/28/21- negative dix-hallpike bil  03/30/21- neg dix-hallpike bil   Target Date 04/19/21      PT LONG TERM GOAL #3   Title Patient will be able to ascend/descend stairs without increased L knee pain.    Time 8    Period Weeks    Status On-going   02/28/21- no knee pain with stairs today   Target Date 04/19/21      PT LONG TERM GOAL #4   Title Patient will demonstrate improved cervical ROM by 10 deg all directions to improve functional mobility.    Baseline see flowsheet    Time 8    Period Weeks  Status Achieved   03/22/21- progress, 40 to 50 deg flexion, 35 to 40 deg extension,  60 deg R rotation (no change), 50 to 60 deg L rotation 04/03/2020- 50 deg flexion and extension, 70 deg rotation bil   Target Date 04/19/21      PT LONG TERM GOAL #5   Title Patient will be able to look upwards without limitation from dizziness to shop.    Baseline dizziness with prolonged neck extension.    Time 8    Period Weeks    Status On-going   03/13/21- no dizziness with looking up for shorter periods.  03/22/21- has not noted dizziness with shopping now 04/03/2021- 1 brief episode last week looking up   Target Date 04/19/21                   Plan - 04/06/21 1129     Clinical Impression Statement Carrie Elliott continues to have no dizziness and only mild L knee pain.  Today focused on progressing balance exercises to include much higher level balance activities including stepping strategy, hip strategy, reaching outside BOS while on compliant surface, and activities to encourage maintaining SLS.  She was able to complete all exercises with minimal balance "checks" no overt loss of balance and no assistance, demonstrating improving balance and motor control.    Personal Factors and Comorbidities Comorbidity 3+    Comorbidities falls with injury, vertigo, BPPV, osteoarthritis, HTN, chronic L knee pain    Examination-Activity Limitations Bed Mobility;Transfers;Locomotion Level;Stairs;Reach Overhead    Examination-Participation Restrictions Cleaning;Shop;Community Activity;Occupation    Stability/Clinical Decision Making Evolving/Moderate complexity    Rehab Potential Good    PT Frequency 2x / week    PT Duration 8 weeks    PT Treatment/Interventions ADLs/Self Care Home Management;Cryotherapy;Electrical Stimulation;Moist Heat;Iontophoresis 42m/ml Dexamethasone;Ultrasound;Gait training;Stair training;Functional mobility training;Therapeutic activities;Therapeutic exercise;Balance training;Neuromuscular re-education;Patient/family education;Manual techniques;Passive range of motion;Dry  needling;Taping;Vestibular;Spinal Manipulations;Joint Manipulations;Canalith Repostioning    PT Next Visit Plan stepping strategy, manual therapy as needed.  Recheck for BPPV both canals.    PT Home Exercise Plan Access Code: [email or web address]    Consulted and Agree with Plan of Care Patient             Patient will benefit from skilled therapeutic intervention in order to improve the following deficits and impairments:  Decreased balance, Decreased endurance, Decreased mobility, Difficulty walking, Increased muscle spasms, Decreased range of motion, Dizziness, Impaired perceived functional ability, Decreased activity tolerance, Decreased safety awareness, Decreased strength, Impaired flexibility, Postural dysfunction, Pain  Visit Diagnosis: Disequilibrium  Left knee pain, unspecified chronicity  Recurrent falls  Unsteady gait  Abnormal posture     Problem List 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  [place], [place], [address] Phone: [phone]   Fax:  [phone]  Name: Carrie Elliott MRN: [id number] Date of Birth: 18-Apr-1950

## 2021-04-07 ENCOUNTER — Encounter: Payer: Self-pay | Admitting: Obstetrics and Gynecology

## 2021-04-07 ENCOUNTER — Ambulatory Visit (INDEPENDENT_AMBULATORY_CARE_PROVIDER_SITE_OTHER): Payer: Medicare Other | Admitting: Obstetrics and Gynecology

## 2021-04-07 VITALS — BP 136/69 | HR 67 | Ht 61.5 in | Wt 167.0 lb

## 2021-04-07 DIAGNOSIS — N8110 Cystocele, unspecified: Secondary | ICD-10-CM | POA: Diagnosis not present

## 2021-04-07 DIAGNOSIS — R350 Frequency of micturition: Secondary | ICD-10-CM

## 2021-04-07 DIAGNOSIS — N393 Stress incontinence (female) (male): Secondary | ICD-10-CM | POA: Diagnosis not present

## 2021-04-18 ENCOUNTER — Other Ambulatory Visit: Payer: Self-pay

## 2021-04-18 ENCOUNTER — Ambulatory Visit: Payer: Medicare Other | Admitting: Physical Therapy

## 2021-04-18 ENCOUNTER — Encounter: Payer: Self-pay | Admitting: Physical Therapy

## 2021-04-18 DIAGNOSIS — R42 Dizziness and giddiness: Secondary | ICD-10-CM | POA: Diagnosis not present

## 2021-04-18 DIAGNOSIS — M25562 Pain in left knee: Secondary | ICD-10-CM

## 2021-04-18 DIAGNOSIS — R2681 Unsteadiness on feet: Secondary | ICD-10-CM | POA: Diagnosis not present

## 2021-04-18 DIAGNOSIS — R296 Repeated falls: Secondary | ICD-10-CM

## 2021-04-18 DIAGNOSIS — R293 Abnormal posture: Secondary | ICD-10-CM

## 2021-04-18 NOTE — Patient Instructions (Signed)
Access Code: [email or web address] URL: [email or web address] Date: 04/18/2021 Prepared by: Glenetta Hew  Exercises Step Up - 1 x daily - 7 x weekly - 3 sets - 10 reps Lateral Step Up - 1 x daily - 7 x weekly - 3 sets - 10 reps Step Sideways with Arms Reaching - 1 x daily - 7 x weekly - 3 sets - 10 reps Step Forward with Opposite Arm Reach - 1 x daily - 7 x weekly - 3 sets - 10 reps  Patient Education walking program

## 2021-04-18 NOTE — Therapy (Signed)
[facility] [address]  [place], [place], [address] Phone: [phone]   Fax:  [phone]  Physical Therapy Treatment PHYSICAL THERAPY DISCHARGE SUMMARY  Visits from Start of Care: 12  Current functional level related to goals / functional outcomes: All goals met or almost met. See below   Remaining deficits: Mild L knee pain   Education / Equipment: HEP  Plan: Patient agrees to discharge.  Patient is being discharged due to meeting the stated rehab goals.       Patient Details  Name: Carrie Elliott MRN: [id number] Date of Birth: [birth year] Referring Provider (PT): Penni Homans   Encounter Date: 04/18/2021   PT End of Session - 04/18/21 0935     Visit Number 12    Number of Visits 16    Date for PT Re-Evaluation 04/19/21    Authorization Type Medicare + supplement    Progress Note Due on Visit 10    PT Start Time 0930    PT Stop Time 1015    PT Time Calculation (min) 45 min    Activity Tolerance Patient tolerated treatment well   nausea with BPPV testing and treatment   Behavior During Therapy [facility] for tasks assessed/performed             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    Past Surgical History:  Procedure Laterality Date   APPENDECTOMY  1989   atypical nevus excision     BREAST BIOPSY Right    CHOLECYSTECTOMY  1989   COLONOSCOPY WITH PROPOFOL N/A 05/27/2015   Procedure: COLONOSCOPY WITH  PROPOFOL;  Surgeon: Clarene Essex, MD;  Location: [facility];  Service: Endoscopy;  Laterality: N/A;   CYSTOSCOPY W/ URETERAL STENT REMOVAL  1999   ESOPHAGOGASTRODUODENOSCOPY  2011   with esophageal stent, perforation    ESOPHAGOGASTRODUODENOSCOPY (EGD) WITH PROPOFOL N/A 05/27/2015   Procedure: ESOPHAGOGASTRODUODENOSCOPY (EGD) WITH PROPOFOL;  Surgeon: Clarene Essex, MD;  Location: [facility];  Service: Endoscopy;  Laterality: N/A;   HEMORRHOID SURGERY  2005   LEFT OOPHORECTOMY  0037   complicated by ureteral injuery requiring a ureteral stent    MMK / ANTERIOR VESICOURETHROPEXY / URETHROPEXY  2000   TONSILLECTOMY AND ADENOIDECTOMY     TOTAL ABDOMINAL HYSTERECTOMY  2000   with USO   TUBAL LIGATION  1981   wisdom teeth removal  1964    There were no vitals filed for this visit.   Subjective Assessment - 04/18/21 0934     Subjective Pt. reports she is doing well today.  No dizziness at all this week.    Pertinent History history of falls with injury    Limitations Standing;Walking;House hold activities;Other (comment)   shopping, stairs   How long can you walk comfortably? 10-15 minutes    Diagnostic tests 10/14/20- L knee Xrays 1. No fracture or dislocation of the right knee.  2. Mild  tricompartmental osteoarthritis.  10/14/20- cervical/head CT- Moderate multilevel degenerative disc disease is noted in the  cervical spine. No acute abnormality is noted.    Patient Stated Goals feel more steady    Currently in Pain? No/denies    Pain Onset Other (comment)    Pain Onset Other (comment)                [facility] PT Assessment - 04/18/21 0001       Assessment   Medical Diagnosis M25.562 (ICD-10-CM) - Left knee pain, unspecified chronicity  R26.81 (ICD-10-CM) - Unsteady gait  R42 (ICD-10-CM) - Disequilibrium  R29.6 (ICD-10-CM) - Recurrent falls    Referring Provider (PT) Penni Homans      Observation/Other Assessments   Focus on Therapeutic Outcomes (FOTO)  Knee 62      AROM    Cervical Flexion 50    Cervical Extension 50    Cervical - Right Rotation 70    Cervical - Left Rotation 70                           [facility] Adult PT Treatment/Exercise - 04/18/21 0001       Knee/Hip Exercises: Aerobic   Nustep L5 x 6 min      Knee/Hip Exercises: Standing   Lateral Step Up Both;2 sets;10 reps;Step Height: 8";Hand Hold: 2    Lateral Step Up Limitations on bottom step of stairway, cues for eccentric control    Forward Step Up 10 reps;Both;2 sets;Hand Hold: 1;Step Height: 8"    Forward Step Up Limitations on bottom step of stairway, cues for eccentric control      Manual Therapy   Manual Therapy Soft tissue mobilization;Myofascial release;Joint mobilization    Manual therapy comments to neck and shoulders in sitting to decrease muscle spasm and headache    Joint Mobilization PA mobs cervical spine, NAGs into rotation    Soft tissue mobilization STM to cervical paraspinals    Myofascial Release TPR to R levator                 Balance Exercises - 04/18/21 0001       Balance Exercises: Standing   Standing Eyes Opened Foam/compliant surface;Head turns;Limitations    Standing Eyes Opened Limitations on airex eyes open x 30 sec, head turns x 10, head nods x 10, in corner with SBA for safety    Standing Eyes Closed Head turns;Foam/compliant surface;Limitations    Standing Eyes Closed Limitations on airex eyes closed x 30 sec, head turns x 10, head nods x 10, in corner with SBA for safety    Stepping Strategy Anterior;Posterior;Lateral;10 reps;Limitations    Stepping Strategy Limitations stepping with arm movements (raising both with foward step and return 2 x 10 bil, crossed with back step and return x 10 bil, reaching to side with side step and return x 10 bil, then combination foward and backward stepping strategy x 10 bil).  SBA for safety, no LOB.                  PT Short Term Goals - 02/28/21 1234       PT SHORT TERM GOAL #1    Title Pt. will be evaluated for balance.    Time 2    Period Weeks    Status Achieved   02/28/21 - completed FGA and mCTSIB   Target Date 03/08/21  PT Long Term Goals - 04/18/21 1610       PT LONG TERM GOAL #1   Title Patient to be independent with advanced HEP to improve outcomes    Time 8    Period Weeks    Status Achieved   04/03/20- met for current   Target Date 04/19/21      PT LONG TERM GOAL #2   Title Patient will demonstrate resolution of BPPV symptoms.    Time 8    Period Weeks    Status Achieved   02/28/21- negative dix-hallpike bil  03/30/21- neg dix-hallpike bil   Target Date 04/19/21      PT LONG TERM GOAL #3   Title Patient will be able to ascend/descend stairs without increased L knee pain.    Time 8    Period Weeks    Status Partially Met   02/28/21- no knee pain with stairs today   Target Date 04/19/21      PT LONG TERM GOAL #4   Title Patient will demonstrate improved cervical ROM by 10 deg all directions to improve functional mobility.    Baseline see flowsheet    Time 8    Period Weeks    Status Achieved   03/22/21- progress, 40 to 50 deg flexion, 35 to 40 deg extension, 60 deg R rotation (no change), 50 to 60 deg L rotation 04/03/2020- 50 deg flexion and extension, 70 deg rotation bil   Target Date 04/19/21      PT LONG TERM GOAL #5   Title Patient will be able to look upwards without limitation from dizziness to shop.    Baseline dizziness with prolonged neck extension.    Time 8    Period Weeks    Status Achieved   03/13/21- no dizziness with looking up for shorter periods.  03/22/21- has not noted dizziness with shopping now 04/03/2021- 1 brief episode last week looking up   Target Date 04/19/21                   Plan - 04/18/21 1232     Clinical Impression Statement Patient reports no further incidents of dizziness, no further falls, and decreased L knee pain.  Today focused on reviewing balance exercises and stair for  continued strengthening, and also recommended considering starting a walking program.  She has made good progress with physical therapy, met or almost met all goals and is appropriate and agreeable to discharge.    Personal Factors and Comorbidities Comorbidity 3+    Comorbidities falls with injury, vertigo, BPPV, osteoarthritis, HTN, chronic L knee pain    Examination-Activity Limitations Bed Mobility;Transfers;Locomotion Level;Stairs;Reach Overhead    Examination-Participation Restrictions Cleaning;Shop;Community Activity;Occupation    Stability/Clinical Decision Making Evolving/Moderate complexity    Rehab Potential Good    PT Frequency 2x / week    PT Duration 8 weeks    PT Treatment/Interventions ADLs/Self Care Home Management;Cryotherapy;Electrical Stimulation;Moist Heat;Iontophoresis 5m/ml Dexamethasone;Ultrasound;Gait training;Stair training;Functional mobility training;Therapeutic activities;Therapeutic exercise;Balance training;Neuromuscular re-education;Patient/family education;Manual techniques;Passive range of motion;Dry needling;Taping;Vestibular;Spinal Manipulations;Joint Manipulations;Canalith Repostioning    PT Next Visit Plan discharge    PT Home Exercise Plan Access Code: [email or web address]    Consulted and Agree with Plan of Care Patient             Patient will benefit from skilled therapeutic intervention in order to improve the following deficits and impairments:  Decreased balance, Decreased endurance, Decreased mobility, Difficulty walking, Increased muscle spasms, Decreased range of motion, Dizziness, Impaired perceived functional ability,  Decreased activity tolerance, Decreased safety awareness, Decreased strength, Impaired flexibility, Postural dysfunction, Pain  Visit Diagnosis: Disequilibrium  Left knee pain, unspecified chronicity  Recurrent falls  Unsteady gait  Abnormal posture     Problem List Patient Active Problem List   Diagnosis Date Noted   Left  knee pain 01/22/2021   Cystocele, unspecified (CODE) 10/16/2020   Insomnia 10/16/2020   Eyelid abnormality 10/16/2020   Diverticulosis 12/09/2019   Iron deficiency anemia 06/11/2019   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   Sleep apnea 09/04/2018   Vitamin B12 deficiency 02/18/2018   Bruit of right carotid artery 05/26/2017   Vertigo 11/23/2016   Vitamin D deficiency 04/06/2016   Osteopenia 10/09/2015   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   History of colonic polyps 09/30/2015   Gastric polyps 09/30/2015   Hip pain 09/05/2014   Urinary frequency 09/05/2014   Preventative health care 09/05/2014   Sun-damaged skin 09/05/2014   Myalgia and myositis 02/11/2014   Benign paroxysmal positional vertigo 07/12/2013   HTN (hypertension) 04/23/2013   Nodule of left lung 03/30/2011   Allergic rhinitis    Arthritis    Gastroesophageal reflux disease with hiatal hernia     Rennie Natter, PT, DPT  04/18/2021, 12:40 PM  [facility] [address]  [place], [place], [address] Phone: [phone]   Fax:  [phone]  Name: MCKENIZE MEZERA MRN: [id number] Date of Birth: 1950-05-15

## 2021-04-19 ENCOUNTER — Other Ambulatory Visit (HOSPITAL_BASED_OUTPATIENT_CLINIC_OR_DEPARTMENT_OTHER): Payer: Self-pay

## 2021-04-19 ENCOUNTER — Encounter: Payer: Self-pay | Admitting: Family

## 2021-04-19 DIAGNOSIS — Z86010 Personal history of colonic polyps: Secondary | ICD-10-CM | POA: Diagnosis not present

## 2021-04-19 DIAGNOSIS — K317 Polyp of stomach and duodenum: Secondary | ICD-10-CM | POA: Diagnosis not present

## 2021-04-19 DIAGNOSIS — K219 Gastro-esophageal reflux disease without esophagitis: Secondary | ICD-10-CM | POA: Diagnosis not present

## 2021-04-19 MED ORDER — FAMOTIDINE 20 MG PO TABS
ORAL_TABLET | ORAL | 2 refills | Status: DC
  Filled 2021-04-19 – 2021-07-14 (×2): qty 180, 90d supply, fill #0
  Filled 2021-12-11: qty 180, 90d supply, fill #1
  Filled 2022-03-21: qty 180, 90d supply, fill #2

## 2021-04-19 MED ORDER — LIDOCAINE-HYDROCORT (PERIANAL) 3-0.5 % EX CREA
TOPICAL_CREAM | CUTANEOUS | 2 refills | Status: DC
  Filled 2021-04-19 (×2): qty 98, 30d supply, fill #0
  Filled 2021-04-21: qty 49, 30d supply, fill #0

## 2021-04-19 MED ORDER — ESOMEPRAZOLE MAGNESIUM 40 MG PO CPDR
DELAYED_RELEASE_CAPSULE | ORAL | 4 refills | Status: DC
  Filled 2021-04-19: qty 90, 90d supply, fill #0
  Filled 2021-07-14: qty 90, 90d supply, fill #1
  Filled 2021-10-17: qty 90, 90d supply, fill #2
  Filled 2022-01-15: qty 90, 90d supply, fill #3
  Filled 2022-04-10: qty 90, 90d supply, fill #4

## 2021-04-21 ENCOUNTER — Other Ambulatory Visit (HOSPITAL_BASED_OUTPATIENT_CLINIC_OR_DEPARTMENT_OTHER): Payer: Self-pay

## 2021-04-21 ENCOUNTER — Encounter: Payer: Self-pay | Admitting: Family

## 2021-05-04 ENCOUNTER — Other Ambulatory Visit (HOSPITAL_BASED_OUTPATIENT_CLINIC_OR_DEPARTMENT_OTHER): Payer: Self-pay

## 2021-05-04 ENCOUNTER — Other Ambulatory Visit: Payer: Self-pay | Admitting: Family Medicine

## 2021-05-04 MED ORDER — LISINOPRIL 10 MG PO TABS
ORAL_TABLET | Freq: Two times a day (BID) | ORAL | 1 refills | Status: DC
  Filled 2021-05-04: qty 180, 90d supply, fill #0

## 2021-05-08 ENCOUNTER — Encounter: Payer: Self-pay | Admitting: Physical Therapy

## 2021-05-08 ENCOUNTER — Ambulatory Visit: Payer: Medicare Other | Attending: Obstetrics and Gynecology | Admitting: Physical Therapy

## 2021-05-08 ENCOUNTER — Other Ambulatory Visit: Payer: Self-pay

## 2021-05-08 DIAGNOSIS — N393 Stress incontinence (female) (male): Secondary | ICD-10-CM | POA: Diagnosis present

## 2021-05-08 DIAGNOSIS — R293 Abnormal posture: Secondary | ICD-10-CM | POA: Diagnosis not present

## 2021-05-08 DIAGNOSIS — M6281 Muscle weakness (generalized): Secondary | ICD-10-CM | POA: Insufficient documentation

## 2021-05-08 DIAGNOSIS — N8110 Cystocele, unspecified: Secondary | ICD-10-CM | POA: Insufficient documentation

## 2021-05-08 DIAGNOSIS — R279 Unspecified lack of coordination: Secondary | ICD-10-CM | POA: Diagnosis not present

## 2021-05-08 NOTE — Patient Instructions (Signed)

## 2021-05-08 NOTE — Therapy (Signed)
[facility] @ [address] [place], [place], [address] Phone: [phone]   Fax:  [phone]  Physical Therapy Evaluation  Patient Details  Name: Carrie Elliott MRN: [id number] Date of Birth: Oct 05, 1950 Referring Provider (PT): Megan Salon, MD   Encounter Date: 05/08/2021   PT End of Session - 05/08/21 1549     Visit Number 1    Date for PT Re-Evaluation 08/05/21    Authorization Type Medicare    Progress Note Due on Visit 10    PT Start Time 1445    PT Stop Time 1528    PT Time Calculation (min) 43 min    Activity Tolerance Patient tolerated treatment well    Behavior During Therapy [facility] for tasks assessed/performed             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    Past Surgical History:  Procedure Laterality Date   APPENDECTOMY  1989   atypical nevus excision     BREAST BIOPSY Right    CHOLECYSTECTOMY  1989   COLONOSCOPY WITH PROPOFOL N/A 05/27/2015   Procedure: COLONOSCOPY WITH PROPOFOL;  Surgeon: Clarene Essex, MD;  Location: [facility];  Service: Endoscopy;  Laterality: N/A;   CYSTOSCOPY W/ URETERAL STENT REMOVAL  1999   ESOPHAGOGASTRODUODENOSCOPY  2011   with esophageal stent, perforation    ESOPHAGOGASTRODUODENOSCOPY (EGD) WITH PROPOFOL N/A 05/27/2015   Procedure: ESOPHAGOGASTRODUODENOSCOPY (EGD) WITH PROPOFOL;  Surgeon: Clarene Essex, MD;  Location: [facility];  Service: Endoscopy;  Laterality: N/A;    HEMORRHOID SURGERY  2005   LEFT OOPHORECTOMY  7939   complicated by ureteral injuery requiring a ureteral stent    MMK / ANTERIOR VESICOURETHROPEXY / URETHROPEXY  2000   TONSILLECTOMY AND ADENOIDECTOMY     TOTAL ABDOMINAL HYSTERECTOMY  2000   with USO   TUBAL LIGATION  1981   wisdom teeth removal  1964    There were no vitals filed for this visit.    Subjective Assessment - 05/08/21 1449     Subjective Pt reports she has been having a bulge between legs feeling it in 2020 and recenty confirmed stage 2 prolapse. Pt does history of perineal tearing grade 4 with second child and episotomy with first. Pt reports any lifting, housework, yard work, squating symptoms are worse.    Pertinent History history of falls with injury, tearing with second child and episotomy with first    Limitations Standing;Walking;House hold activities;Other (comment)    How long can you sit comfortably? no limits    How long can you stand comfortably? no limits    How long can you walk comfortably? no limits    Patient Stated Goals to have less heaviness or bulge at pelvis    Currently in Pain? No/denies                [facility] PT Assessment - 05/08/21 0001  Assessment   Medical Diagnosis N81.11 (ICD-10-CM) - Cystocele, midline    Referring Provider (PT) Megan Salon, MD    Onset Date/Surgical Date --   2020   Prior Therapy not PFPT      Precautions   Precautions None      Restrictions   Weight Bearing Restrictions No      Balance Screen   Has the patient fallen in the past 6 months No    How many times? --   did have a fall last summer (july 2022, aug 2022) did get PT for Rt wrist fracture with this and PT for balance   Has the patient had a decrease in activity level because of a fear of falling?  No    Is the patient reluctant to leave their home because of a fear of falling?  No      Home Ecologist residence    Living Arrangements Spouse/significant  other      Prior Function   Level of Independence Independent      Cognition   Overall Cognitive Status Within Functional Limits for tasks assessed      Sensation   Light Touch Appears Intact      Coordination   Gross Motor Movements are Fluid and Coordinated Yes    Fine Motor Movements are Fluid and Coordinated Yes      Posture/Postural Control   Posture/Postural Control Postural limitations    Postural Limitations Rounded Shoulders;Posterior pelvic tilt;Increased thoracic kyphosis      ROM / Strength   AROM / PROM / Strength AROM;Strength      AROM   Overall AROM Comments thoracic and lumbar spine WFL for all directions except side bending and rotation limited by 25% bil      Strength   Overall Strength Comments bil hips grossly 3+/5 in all directions      Flexibility   Soft Tissue Assessment /Muscle Length yes   bil hamstrings and adductors limited by 25%     Palpation   Palpation comment noted fascial restrictions throughout lower abdomen, along scar site in anterior pelvis from  hysterectomy, no other tenderness noted                        Objective measurements completed on examination: See above findings.     Pelvic Floor Special Questions - 05/08/21 0001     Prior Pelvic/Prostate Exam No    Are you Pregnant or attempting pregnancy? No    Prior Pregnancies Yes    Number of Pregnancies 2    Number of Vaginal Deliveries 2    Any difficulty with labor and deliveries --   grade 4 tearing with second   Episiotomy Performed Yes    Currently Sexually Active Yes    Is this Painful No    History of sexually transmitted disease No    Marinoff Scale no problems    Urinary Leakage Yes    How often very rare    Pad use n    Activities that cause leaking Coughing   only with very strong cough if sick   Urinary urgency Yes    Urinary frequency not ususally but sometimes - sometimes feels like she is not fully emptying    Fecal incontinence No     Fluid intake fair - not enough    Caffeine beverages sometimes - soda    Falling out feeling (prolapse) Yes  Activities that cause feeling of prolapse squating, lifting,    External Perineal Exam WFL, mild dryness noted    Prolapse Anterior Wall   grade 2   Pelvic Floor Internal Exam patient identified and patient confirms consent for PT to perform internal soft tissue work and muscle strength and integrity assessment    Exam Type Vaginal    Sensation WFL    Palpation no TTP    Strength fair squeeze, definite lift    Strength # of reps 6    Strength # of seconds 4    Tone WFL                       PT Education - 05/08/21 1548     Education Details Pt educated on exam findings, POC, HEP, and vaginal moisturizers.    Person(s) Educated Patient    Methods Explanation;Demonstration;Tactile cues;Verbal cues;Handout    Comprehension Returned demonstration;Verbalized understanding              PT Short Term Goals - 05/08/21 1601       PT SHORT TERM GOAL #1   Title pt to be I with HEP    Time 4    Period Weeks    Status New    Target Date 06/05/21      PT SHORT TERM GOAL #2   Title pt to demonstrate at least 3/5 pelvic floor strength with ability to hold contraction for 10s for improved pelvic floor strength and endurance for lifting without prolapse symptoms felt.    Time 4    Period Weeks    Status New    Target Date 06/05/21      PT SHORT TERM GOAL #3   Title pt to demonstrate improved coordination with pelvic floor contract/relax with breathing mechanics during functional tasks at least 50% of the timefor decrease strain at prolapse.    Time 4    Period Weeks    Status New    Target Date 06/05/21               PT Long Term Goals - 05/08/21 1603       PT LONG TERM GOAL #1   Title pt to be I with advanced HEP    Time 3    Status New    Target Date 08/05/21      PT LONG TERM GOAL #2   Title pt to demonstrate at least 4/5 pelvic floor  strength with ability to hold contraction for 10s for improved pelvic floor strength and endurance for lifting without prolapse symptoms felt.    Time 3    Period Months    Status New    Target Date 08/05/21      PT LONG TERM GOAL #3   Title pt to demonstrate improved coordination with pelvic floor contract/relax with breathing mechanics during functional tasks at least 75% of the timefor decrease strain at prolapse.    Time 3    Period Months    Status New    Target Date 08/05/21      PT LONG TERM GOAL #4   Title pt to demonstrate ability to lift 20# from ground level with proper lifting mechanics and breathing mechanics for decreased strain at pelvic floor    Time 3    Period Months    Status New    Target Date 08/05/21  Plan - 05/08/21 1550     Clinical Impression Statement Pt is 71yo female presenting to clinic with at least a 2.5 year h/o bulge felt between legs wose with any lifting, housework, yard work, Facilities manager and grade 2 cystocele confirmed at recent GYN appointment. Pt presents today with hopes of PT assisting in surgery prevention or at least prolonging if possible. Pt very motivated to attempt PT. Pt found to have decreased flexibility with bil hips and thoracic and lumbar spine in side bending and rotation; had noted fascial restictions in abdomen and anterior pelvis, consented to internal vaginal pelvic floor assessment and found to have decreased strength, coordination, and endurance and grade two prolapse noted as well. Pt benefited from cues for not holding breath with attempts to contract pelvic floor and less compensatory strategies with core and glute activiation. Pt tolerated well had no pain. Pt educated on vaginal moisturizers as she did have some mild external dryness, also POC, HEP. Pt would benefit from additional PT to further address deficits.    Personal Factors and Comorbidities Time since onset of injury/illness/exacerbation     Examination-Activity Limitations Continence;Lift;Squat;Carry    Examination-Participation Restrictions Community Activity;Shop;Yard Work;Cleaning    Stability/Clinical Decision Making Stable/Uncomplicated    Clinical Decision Making Low    Rehab Potential Good    PT Frequency 1x / week    PT Duration Other (comment)   10 visits   PT Treatment/Interventions ADLs/Self Care Home Management;Aquatic Therapy;Cryotherapy;Moist Heat;Functional mobility training;Stair training;Therapeutic activities;Therapeutic exercise;Neuromuscular re-education;Manual techniques;Patient/family education;Taping;Scar mobilization;Passive range of motion;Energy conservation;Joint Manipulations    PT Next Visit Plan go over handouts, breathing/core/PF coordination    PT Home Exercise Plan [id number]    [id number] and Agree with Plan of Care Patient             Patient will benefit from skilled therapeutic intervention in order to improve the following deficits and impairments:  Decreased coordination, Decreased endurance, Increased fascial restricitons, Improper body mechanics, Impaired flexibility, Decreased scar mobility, Decreased mobility, Decreased strength, Impaired sensation, Postural dysfunction  Visit Diagnosis: Muscle weakness (generalized) - Plan: PT plan of care cert/re-cert  Abnormal posture - Plan: PT plan of care cert/re-cert  Unspecified lack of coordination - Plan: PT plan of care cert/re-cert     Problem List 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   Sleep apnea 09/04/2018   Vitamin B12 deficiency 02/18/2018   Bruit of right carotid artery 05/26/2017   Vertigo 11/23/2016   Vitamin D deficiency 04/06/2016   Osteopenia 10/09/2015   Vocal cord polyp  09/30/2015   History of colonic polyps 09/30/2015   Gastric polyps 09/30/2015   Hip pain 09/05/2014   Urinary frequency 09/05/2014   Preventative health care 09/05/2014   Sun-damaged skin 09/05/2014   Myalgia and myositis 02/11/2014   Benign paroxysmal positional vertigo 07/12/2013   HTN (hypertension) 04/23/2013   Nodule of left lung 03/30/2011   Allergic rhinitis    Arthritis    Gastroesophageal reflux disease with hiatal hernia     No emotional/communication barriers or cognitive limitation. Patient is motivated to learn. Patient understands and agrees with treatment goals and plan. PT explains patient will be examined in standing, sitting, and lying down to see how their muscles and joints work. When they are ready, they will be  asked to remove their underwear so PT can examine their perineum. The patient is also given the option of providing their own chaperone as one is not provided in our facility. The patient also has the right and is explained the right to defer or refuse any part of the evaluation or treatment including the internal exam. With the patient's consent, PT will use one gloved finger to gently assess the muscles of the pelvic floor, seeing how well it contracts and relaxes and if there is muscle symmetry. After, the patient will get dressed and PT and patient will discuss exam findings and plan of care. PT and patient discuss plan of care, schedule, attendance policy and HEP activities.   Stacy Gardner, PT, DPT 02/13/234:06 PM   [facility] @ [address] [place], [place], [address] Phone: [phone]   Fax:  [phone]  Name: Carrie Elliott MRN: [id number] Date of Birth: 04-Dec-1950

## 2021-05-15 ENCOUNTER — Other Ambulatory Visit: Payer: Medicare Other

## 2021-05-16 ENCOUNTER — Ambulatory Visit: Payer: Medicare Other | Admitting: Physical Therapy

## 2021-05-16 ENCOUNTER — Other Ambulatory Visit: Payer: Self-pay

## 2021-05-16 DIAGNOSIS — R279 Unspecified lack of coordination: Secondary | ICD-10-CM | POA: Diagnosis not present

## 2021-05-16 DIAGNOSIS — M6281 Muscle weakness (generalized): Secondary | ICD-10-CM

## 2021-05-16 DIAGNOSIS — N393 Stress incontinence (female) (male): Secondary | ICD-10-CM | POA: Diagnosis not present

## 2021-05-16 DIAGNOSIS — R293 Abnormal posture: Secondary | ICD-10-CM | POA: Diagnosis not present

## 2021-05-16 DIAGNOSIS — N8110 Cystocele, unspecified: Secondary | ICD-10-CM | POA: Diagnosis not present

## 2021-05-16 NOTE — Therapy (Signed)
[facility] @ [address] [place], [place], [address] Phone: [phone]   Fax:  [phone]  Physical Therapy Treatment  Patient Details  Name: Carrie Elliott MRN: [id number] Date of Birth: [birth year] Referring Provider (PT): Megan Salon, MD   Encounter Date: 05/16/2021   PT End of Session - 05/16/21 1235     Visit Number 2    Date for PT Re-Evaluation 08/05/21    Authorization Type Medicare    Progress Note Due on Visit 10    PT Start Time 1233    PT Stop Time 1315    PT Time Calculation (min) 42 min    Activity Tolerance Patient tolerated treatment well    Behavior During Therapy [facility] for tasks assessed/performed             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    Past Surgical History:  Procedure Laterality Date   APPENDECTOMY  1989   atypical nevus excision     BREAST BIOPSY Right    CHOLECYSTECTOMY  1989   COLONOSCOPY WITH PROPOFOL N/A 05/27/2015   Procedure: COLONOSCOPY WITH PROPOFOL;  Surgeon: Clarene Essex, MD;  Location: [facility];  Service: Endoscopy;  Laterality: N/A;   CYSTOSCOPY W/ URETERAL STENT REMOVAL  1999   ESOPHAGOGASTRODUODENOSCOPY  2011   with esophageal stent, perforation    ESOPHAGOGASTRODUODENOSCOPY (EGD) WITH PROPOFOL N/A 05/27/2015   Procedure: ESOPHAGOGASTRODUODENOSCOPY (EGD) WITH PROPOFOL;  Surgeon: Clarene Essex, MD;  Location: [facility];  Service: Endoscopy;  Laterality: N/A;    HEMORRHOID SURGERY  2005   LEFT OOPHORECTOMY  8341   complicated by ureteral injuery requiring a ureteral stent    MMK / ANTERIOR VESICOURETHROPEXY / URETHROPEXY  2000   TONSILLECTOMY AND ADENOIDECTOMY     TOTAL ABDOMINAL HYSTERECTOMY  2000   with USO   TUBAL LIGATION  1981   wisdom teeth removal  1964    There were no vitals filed for this visit.   Subjective Assessment - 05/16/21 1235     Subjective Pt reports she would like to go over HEP more to make sure she is doing them correctly did have some abdominal soreness after doing some of the exercises.    Pertinent History history of falls with injury, tearing with second child and episotomy with first    Limitations Standing;Walking;House hold activities;Other (comment)    Diagnostic tests 10/14/20- L knee Xrays 1. No fracture or dislocation of the right knee.  2. Mild tricompartmental osteoarthritis.  10/14/20- cervical/head CT- Moderate multilevel degenerative disc disease is noted in the  cervical spine. No acute abnormality is noted.    Patient Stated Goals to have less heaviness or bulge at pelvis    Currently in Pain? No/denies  [facility] PT Treatment/Exercise - 05/16/21 0001       Self-Care   Self-Care Other Self-Care Comments    Other Self-Care Comments  Pt educated on prolapse relief positions and handout given      Neuro Re-ed    Neuro Re-ed Details  pt directed in 4x10 diaphragmatic breathing with tactile and VC and extra time to complete; pt also directed in coordinating pelvic floor contracting and relaxing with breathing with max cues. Pt also consented to external palpation over clothing at pelvic floor for contraction/relax as pt requested to wait on internal until next session.                     PT Education - 05/16/21 1319     Education Details Pt educated on coordination on pelvic floor with breathing mechanics and prolapse relief positions     Person(s) Educated Patient    Methods Explanation;Demonstration;Tactile cues;Verbal cues;Handout    Comprehension Returned demonstration;Verbalized understanding              PT Short Term Goals - 05/08/21 1601       PT SHORT TERM GOAL #1   Title pt to be I with HEP    Time 4    Period Weeks    Status New    Target Date 06/05/21      PT SHORT TERM GOAL #2   Title pt to demonstrate at least 3/5 pelvic floor strength with ability to hold contraction for 10s for improved pelvic floor strength and endurance for lifting without prolapse symptoms felt.    Time 4    Period Weeks    Status New    Target Date 06/05/21      PT SHORT TERM GOAL #3   Title pt to demonstrate improved coordination with pelvic floor contract/relax with breathing mechanics during functional tasks at least 50% of the timefor decrease strain at prolapse.    Time 4    Period Weeks    Status New    Target Date 06/05/21               PT Long Term Goals - 05/08/21 1603       PT LONG TERM GOAL #1   Title pt to be I with advanced HEP    Time 3    Status New    Target Date 08/05/21      PT LONG TERM GOAL #2   Title pt to demonstrate at least 4/5 pelvic floor strength with ability to hold contraction for 10s for improved pelvic floor strength and endurance for lifting without prolapse symptoms felt.    Time 3    Period Months    Status New    Target Date 08/05/21      PT LONG TERM GOAL #3   Title pt to demonstrate improved coordination with pelvic floor contract/relax with breathing mechanics during functional tasks at least 75% of the timefor decrease strain at prolapse.    Time 3    Period Months    Status New    Target Date 08/05/21      PT LONG TERM GOAL #4   Title pt to demonstrate ability to lift 20# from ground level with proper lifting mechanics and breathing mechanics for decreased strain at pelvic floor    Time 3    Period Months    Status New    Target Date 08/05/21  Plan - 05/16/21 1319     Clinical Impression Statement Pt presents to clinic with multiple questions about HEP and wants to go over this. Pt demonstrated great difficulty with coorindating breathing mechanics initially and this was primary focus ofsession. Once pt improved with this, addition to pelvic floor coordination with breathing and pt benefited from extra time for this to improve mechanics. Pt also educated on prolapse relief positions for end of day and handout given. Pt did not want to do internal this date but agreeable for external palpation over clothing at vaginal opening to insure contract/relax with breathing. Pt would benefit from additional PT to further address deficits.    Personal Factors and Comorbidities Time since onset of injury/illness/exacerbation    Examination-Activity Limitations Continence;Lift;Squat;Carry    Examination-Participation Restrictions Community Activity;Shop;Yard Work;Cleaning    Stability/Clinical Decision Making Stable/Uncomplicated    Rehab Potential Good    PT Frequency 1x / week    PT Duration Other (comment)   10 visits   PT Treatment/Interventions ADLs/Self Care Home Management;Aquatic Therapy;Cryotherapy;Moist Heat;Functional mobility training;Stair training;Therapeutic activities;Therapeutic exercise;Neuromuscular re-education;Manual techniques;Patient/family education;Taping;Scar mobilization;Passive range of motion;Energy conservation;Joint Manipulations    PT Next Visit Plan go over handouts, breathing/core/PF coordination    PT Home Exercise Plan [id number]    [id number] and Agree with Plan of Care Patient             Patient will benefit from skilled therapeutic intervention in order to improve the following deficits and impairments:  Decreased coordination, Decreased endurance, Increased fascial restricitons, Improper body mechanics, Impaired flexibility, Decreased scar mobility, Decreased mobility, Decreased strength,  Impaired sensation, Postural dysfunction  Visit Diagnosis: Muscle weakness (generalized)  Unspecified lack of coordination     Problem List 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   History of colonic polyps 09/30/2015   Gastric polyps 09/30/2015   Hip pain 09/05/2014   Urinary frequency 09/05/2014   Preventative health care 09/05/2014   Sun-damaged skin 09/05/2014   Myalgia and myositis 02/11/2014   Benign paroxysmal positional vertigo 07/12/2013   HTN (hypertension) 04/23/2013   Nodule of left lung 03/30/2011   Allergic rhinitis    Arthritis    Gastroesophageal reflux disease with hiatal hernia     Stacy Gardner, PT, DPT 02/21/231:25 PM   [facility] @ [address] [place], [place], [address] Phone: [phone]   Fax:  [phone]  Name: EVAMARIA DETORE MRN: [id number] Date of Birth: 1950-08-12

## 2021-05-24 ENCOUNTER — Encounter: Payer: Self-pay | Admitting: Physical Therapy

## 2021-05-24 ENCOUNTER — Other Ambulatory Visit: Payer: Self-pay

## 2021-05-24 ENCOUNTER — Ambulatory Visit: Payer: Medicare Other | Attending: Obstetrics and Gynecology | Admitting: Physical Therapy

## 2021-05-24 DIAGNOSIS — R278 Other lack of coordination: Secondary | ICD-10-CM | POA: Insufficient documentation

## 2021-05-24 DIAGNOSIS — R279 Unspecified lack of coordination: Secondary | ICD-10-CM | POA: Insufficient documentation

## 2021-05-24 DIAGNOSIS — M6281 Muscle weakness (generalized): Secondary | ICD-10-CM | POA: Diagnosis not present

## 2021-05-24 DIAGNOSIS — R293 Abnormal posture: Secondary | ICD-10-CM | POA: Insufficient documentation

## 2021-05-24 NOTE — Therapy (Signed)
[facility] @ [place], [place], [address] Phone: [phone]   Fax:  [phone]  Physical Therapy Treatment  Patient Details  Name: Carrie Elliott MRN: [id number] Date of Birth: 1950-04-05 Referring Provider (PT): Megan Salon, MD   Encounter Date: 05/24/2021   PT End of Session - 05/24/21 1017     Visit Number 3    Number of Visits 16    Date for PT Re-Evaluation 08/05/21    Authorization Type Medicare    Progress Note Due on Visit 10    PT Start Time 1015    PT Stop Time 1058    PT Time Calculation (min) 43 min    Activity Tolerance Patient tolerated treatment well    Behavior During Therapy [facility] for tasks assessed/performed             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    Past Surgical History:  Procedure Laterality Date   APPENDECTOMY  1989   atypical nevus excision     BREAST BIOPSY Right    CHOLECYSTECTOMY  1989   COLONOSCOPY WITH PROPOFOL N/A 05/27/2015   Procedure: COLONOSCOPY WITH PROPOFOL;  Surgeon: Clarene Essex, MD;  Location: [facility];  Service: Endoscopy;  Laterality: N/A;   CYSTOSCOPY W/ URETERAL STENT REMOVAL  1999   ESOPHAGOGASTRODUODENOSCOPY  2011   with esophageal stent, perforation    ESOPHAGOGASTRODUODENOSCOPY (EGD) WITH PROPOFOL N/A 05/27/2015   Procedure: ESOPHAGOGASTRODUODENOSCOPY (EGD) WITH PROPOFOL;  Surgeon: Clarene Essex, MD;  Location: [facility];  Service: Endoscopy;   Laterality: N/A;   HEMORRHOID SURGERY  2005   LEFT OOPHORECTOMY  1657   complicated by ureteral injuery requiring a ureteral stent    MMK / ANTERIOR VESICOURETHROPEXY / URETHROPEXY  2000   TONSILLECTOMY AND ADENOIDECTOMY     TOTAL ABDOMINAL HYSTERECTOMY  2000   with USO   TUBAL LIGATION  1981   wisdom teeth removal  1964    There were no vitals filed for this visit.   Subjective Assessment - 05/24/21 1018     Subjective Pt reports she has been doing HEP and getting better with this, did have more symptoms of prolapse with 3-4 hours of walking while visiting children in [place] but no leakage.    Pertinent History history of falls with injury, tearing with second child and episotomy with first    Limitations Standing;Walking;House hold activities;Other (comment)    Diagnostic tests 10/14/20- L knee Xrays 1. No fracture or dislocation of the right knee.  2. Mild tricompartmental osteoarthritis.  10/14/20- cervical/head CT- Moderate multilevel degenerative disc disease is noted in the  cervical spine. No acute abnormality is noted.    Patient Stated Goals to have less heaviness or bulge at pelvis    Currently in Pain? No/denies  Pelvic Floor Special Questions - 05/24/21 0001     Prolapse Anterior Wall   grade 2   Pelvic Floor Internal Exam patient identified and patient confirms consent for PT to perform internal soft tissue work and muscle strength and integrity assessment    Exam Type Vaginal    Sensation WFL    Palpation pain with initial insertion of gloved digit at 6' on clockface however relieved with repositioning and more lubricant.    Strength good squeeze, good lift, able to hold agaisnt strong resistance    Strength # of reps 5    Strength # of seconds 5    Tone WFL               [facility] Adult PT Treatment/Exercise - 05/24/21 0001       Self-Care   Self-Care Other Self-Care Comments    Other Self-Care Comments  Pt educated on  updated HEP with hansouts given      Neuro Re-ed    Neuro Re-ed Details  pt directed in 2x10 pelvic floor contractions, x5 isometric holds for 8s and 6N62 quick flicks with cues for consistent speed.                     PT Education - 05/24/21 1207     Education Details Pt educated on continued Hep and new updated stretches    Person(s) Educated Patient    Methods Explanation;Demonstration;Tactile cues;Verbal cues;Handout    Comprehension Verbalized understanding;Returned demonstration              PT Short Term Goals - 05/08/21 1601       PT SHORT TERM GOAL #1   Title pt to be I with HEP    Time 4    Period Weeks    Status New    Target Date 06/05/21      PT SHORT TERM GOAL #2   Title pt to demonstrate at least 3/5 pelvic floor strength with ability to hold contraction for 10s for improved pelvic floor strength and endurance for lifting without prolapse symptoms felt.    Time 4    Period Weeks    Status New    Target Date 06/05/21      PT SHORT TERM GOAL #3   Title pt to demonstrate improved coordination with pelvic floor contract/relax with breathing mechanics during functional tasks at least 50% of the timefor decrease strain at prolapse.    Time 4    Period Weeks    Status New    Target Date 06/05/21               PT Long Term Goals - 05/08/21 1603       PT LONG TERM GOAL #1   Title pt to be I with advanced HEP    Time 3    Status New    Target Date 08/05/21      PT LONG TERM GOAL #2   Title pt to demonstrate at least 4/5 pelvic floor strength with ability to hold contraction for 10s for improved pelvic floor strength and endurance for lifting without prolapse symptoms felt.    Time 3    Period Months    Status New    Target Date 08/05/21      PT LONG TERM GOAL #3   Title pt to demonstrate improved coordination with pelvic floor contract/relax with breathing mechanics during functional tasks at least 75% of the timefor decrease strain  at prolapse.  Time 3    Period Months    Status New    Target Date 08/05/21      PT LONG TERM GOAL #4   Title pt to demonstrate ability to lift 20# from ground level with proper lifting mechanics and breathing mechanics for decreased strain at pelvic floor    Time 3    Period Months    Status New    Target Date 08/05/21                   Plan - 05/24/21 1208     Clinical Impression Statement Pt presents to clinic reporting she has been doing kegels but unsure if she is doing them correctly, agreeable to internal this session. Pt did have pain with initial insertion of one gloved digit at lower 6 o' clock position of introitus but relieved with more lubricant and repositioning. Pt demonstrated improved strength, endurance and coordination of pelvic floor muscles and no TTP throughout internally. Pt benefited from [place] with cues and quick release to improve activiation and muscle recruitment. Pt tolerated well and all questions answered at end of session. Pt would benefit from additional PT to further address deficits.    Personal Factors and Comorbidities Time since onset of injury/illness/exacerbation    Examination-Activity Limitations Continence;Lift;Squat;Carry    Examination-Participation Restrictions Community Activity;Shop;Yard Work;Cleaning    Stability/Clinical Decision Making Stable/Uncomplicated    Rehab Potential Good    PT Frequency 1x / week    PT Duration Other (comment)   10 visits   PT Treatment/Interventions ADLs/Self Care Home Management;Aquatic Therapy;Cryotherapy;Moist Heat;Functional mobility training;Stair training;Therapeutic activities;Therapeutic exercise;Neuromuscular re-education;Manual techniques;Patient/family education;Taping;Scar mobilization;Passive range of motion;Energy conservation;Joint Manipulations    PT Next Visit Plan breathing/core/PF coordination    PT Home Exercise Plan [id number]    HERDEYCXK and Agree with Plan of Care Patient              Patient will benefit from skilled therapeutic intervention in order to improve the following deficits and impairments:  Decreased coordination, Decreased endurance, Increased fascial restricitons, Improper body mechanics, Impaired flexibility, Decreased scar mobility, Decreased mobility, Decreased strength, Impaired sensation, Postural dysfunction  Visit Diagnosis: Muscle weakness (generalized)  Abnormal posture  Other lack of coordination     Problem List 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 Patient is motivated to learn. Patient understands and agrees with treatment goals and plan. PT explains patient will be examined in standing, sitting, and lying down to see how their muscles and joints work. When they are ready, they will be asked to remove  their underwear so PT can examine their perineum. The patient is also given the option of providing their own chaperone as one is not provided in our facility. The patient also has the right and is  explained the right to defer or refuse any part of the evaluation or treatment including the internal exam. With the patient's consent, PT will use one gloved finger to gently assess the muscles of the pelvic floor, seeing how well it contracts and relaxes and if there is muscle symmetry. After, the patient will get dressed and PT and patient will discuss exam findings and plan of care. PT and patient discuss plan of care, schedule, attendance policy and HEP activities.   Stacy Gardner, PT, DPT 05/25/2310:11 PM   [facility] @ [place], [place], [address] Phone: [phone]   Fax:  [phone]  Name: Carrie Elliott MRN: [id number] Date of Birth: Apr 02, 1950

## 2021-05-30 ENCOUNTER — Other Ambulatory Visit: Payer: Self-pay

## 2021-05-30 ENCOUNTER — Ambulatory Visit: Payer: Medicare Other | Admitting: Physical Therapy

## 2021-05-30 DIAGNOSIS — R293 Abnormal posture: Secondary | ICD-10-CM | POA: Diagnosis not present

## 2021-05-30 DIAGNOSIS — R278 Other lack of coordination: Secondary | ICD-10-CM | POA: Diagnosis not present

## 2021-05-30 DIAGNOSIS — M6281 Muscle weakness (generalized): Secondary | ICD-10-CM

## 2021-05-30 DIAGNOSIS — R279 Unspecified lack of coordination: Secondary | ICD-10-CM | POA: Diagnosis not present

## 2021-05-30 NOTE — Therapy (Signed)
[facility] @ [place], [place], [address] Phone: [phone]   Fax:  [phone]  Physical Therapy Treatment  Patient Details   Name: Carrie Elliott MRN: [id number] Date of Birth: 1950/12/14 Referring Provider (PT): Megan Salon, MD   Encounter Date: 05/30/2021   PT End of Session - 05/30/21 1017     Visit Number 4    Number of Visits 16    Date for PT Re-Evaluation 08/05/21    Authorization Type Medicare    Progress Note Due on Visit 10    PT Start Time 1015    PT Stop Time 1056    PT Time Calculation (min) 41 min    Activity Tolerance Patient tolerated treatment well    Behavior During Therapy [facility] for tasks assessed/performed             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    Past Surgical History:  Procedure Laterality Date   APPENDECTOMY  1989   atypical nevus excision     BREAST BIOPSY Right    CHOLECYSTECTOMY  1989   COLONOSCOPY WITH PROPOFOL N/A 05/27/2015   Procedure: COLONOSCOPY WITH PROPOFOL;  Surgeon: Clarene Essex, MD;  Location: [facility];  Service: Endoscopy;  Laterality: N/A;   CYSTOSCOPY W/ URETERAL STENT REMOVAL  1999   ESOPHAGOGASTRODUODENOSCOPY  2011   with esophageal stent, perforation    ESOPHAGOGASTRODUODENOSCOPY (EGD) WITH PROPOFOL N/A 05/27/2015   Procedure: ESOPHAGOGASTRODUODENOSCOPY (EGD) WITH PROPOFOL;  Surgeon: Clarene Essex, MD;  Location: [facility];  Service: Endoscopy;   Laterality: N/A;   HEMORRHOID SURGERY  2005   LEFT OOPHORECTOMY  1245   complicated by ureteral injuery requiring a ureteral stent    MMK / ANTERIOR VESICOURETHROPEXY / URETHROPEXY  2000   TONSILLECTOMY AND ADENOIDECTOMY     TOTAL ABDOMINAL HYSTERECTOMY  2000   with USO   TUBAL LIGATION  1981   wisdom teeth removal  1964    There were no vitals filed for this visit.   Subjective Assessment - 05/30/21 1018     Subjective Pt reports she HEP, feels she is tolerating more activity before feeling the prolapse now, continues to have no leakage. Pt did feel prolapse yesterday after doing more lifting and climbing at home for repairs but improved with rest and hip elevation.    Pertinent History history of falls with injury, tearing with second child and episotomy with first    Limitations Standing;Walking;House hold activities;Other (comment)    Diagnostic tests 10/14/20- L knee Xrays 1. No fracture or dislocation of the right knee.  2. Mild tricompartmental osteoarthritis.  10/14/20- cervical/head CT- Moderate multilevel degenerative disc disease is noted in the  cervical spine. No acute abnormality is noted.    Patient Stated Goals to have less heaviness or bulge at pelvis    Currently in Pain? No/denies  [facility] Adult PT Treatment/Exercise - 05/30/21 0001       Exercises   Exercises Lumbar;Knee/Hip      Lumbar Exercises: Standing   Other Standing Lumbar Exercises 2x10 overhead punch 2# with coordinated breathing and pelvic floor; x10 standing unilateral marching with coordinating breathing and pelvic floor      Lumbar Exercises: Seated   Sit to Stand 20 reps    Sit to Stand Limitations body weight with coordinating pelvic floor and breathing    Other Seated Lumbar Exercises shoulder horizontal abduction red band 2x10 with coordinated breathing    Other Seated Lumbar Exercises same arm/knee press into ball 2x10 each with coordinated  breathing      Lumbar Exercises: Supine   Bridge with Ball Squeeze 20 reps      Lumbar Exercises: Quadruped   Single Arm Raise Right;Left;10 reps                     PT Education - 05/30/21 1100     Education Details Pt educated on breathing mechanics and incorperating pelvic floor into exercises    Person(s) Educated Patient    Methods Explanation;Demonstration;Tactile cues;Verbal cues    Comprehension Verbalized understanding;Returned demonstration              PT Short Term Goals - 05/08/21 1601       PT SHORT TERM GOAL #1   Title pt to be I with HEP    Time 4    Period Weeks    Status New    Target Date 06/05/21      PT SHORT TERM GOAL #2   Title pt to demonstrate at least 3/5 pelvic floor strength with ability to hold contraction for 10s for improved pelvic floor strength and endurance for lifting without prolapse symptoms felt.    Time 4    Period Weeks    Status New    Target Date 06/05/21      PT SHORT TERM GOAL #3   Title pt to demonstrate improved coordination with pelvic floor contract/relax with breathing mechanics during functional tasks at least 50% of the timefor decrease strain at prolapse.    Time 4    Period Weeks    Status New    Target Date 06/05/21               PT Long Term Goals - 05/08/21 1603       PT LONG TERM GOAL #1   Title pt to be I with advanced HEP    Time 3    Status New    Target Date 08/05/21      PT LONG TERM GOAL #2   Title pt to demonstrate at least 4/5 pelvic floor strength with ability to hold contraction for 10s for improved pelvic floor strength and endurance for lifting without prolapse symptoms felt.    Time 3    Period Months    Status New    Target Date 08/05/21      PT LONG TERM GOAL #3   Title pt to demonstrate improved coordination with pelvic floor contract/relax with breathing mechanics during functional tasks at least 75% of the timefor decrease strain at prolapse.    Time 3     Period Months    Status New    Target Date 08/05/21      PT LONG TERM GOAL #4   Title pt to demonstrate ability to lift 20# from ground level with proper lifting mechanics  and breathing mechanics for decreased strain at pelvic floor    Time 3    Period Months    Status New    Target Date 08/05/21                   Plan - 05/30/21 1100     Clinical Impression Statement Pt presents to clinic reporting she has ben doing HEP, has not had any leakage but does feel prolapse after spending a lot of time doing more activity in standing but improves with relief positions. Pt also reports she has a better understanding of pelvic floor exercises now. Pt session focused on breathing and pelvic floor coordination with exercises. Pt tolerated well with cues and extra time. Pt would benefit from additional PT to further address deficits.    Personal Factors and Comorbidities Time since onset of injury/illness/exacerbation    Examination-Activity Limitations Continence;Lift;Squat;Carry    Examination-Participation Restrictions Community Activity;Shop;Yard Work;Cleaning    Stability/Clinical Decision Making Stable/Uncomplicated    Rehab Potential Good    PT Frequency 1x / week    PT Duration Other (comment)   10 visits   PT Treatment/Interventions ADLs/Self Care Home Management;Aquatic Therapy;Cryotherapy;Moist Heat;Functional mobility training;Stair training;Therapeutic activities;Therapeutic exercise;Neuromuscular re-education;Manual techniques;Patient/family education;Taping;Scar mobilization;Passive range of motion;Energy conservation;Joint Manipulations    PT Next Visit Plan breathing/core/PF coordination    PT Home Exercise Plan [id number]    [id number] and Agree with Plan of Care Patient             Patient will benefit from skilled therapeutic intervention in order to improve the following deficits and impairments:  Decreased coordination, Decreased endurance, Increased fascial  restricitons, Improper body mechanics, Impaired flexibility, Decreased scar mobility, Decreased mobility, Decreased strength, Impaired sensation, Postural dysfunction  Visit Diagnosis: Muscle weakness (generalized)  Abnormal posture  Unspecified lack of coordination     Problem List 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   History of colonic polyps 09/30/2015   Gastric polyps 09/30/2015   Hip pain 09/05/2014   Urinary frequency 09/05/2014   Preventative health care 09/05/2014   Sun-damaged skin 09/05/2014   Myalgia and myositis 02/11/2014   Benign paroxysmal positional vertigo 07/12/2013   HTN (hypertension) 04/23/2013   Nodule of left lung 03/30/2011   Allergic rhinitis    Arthritis    Gastroesophageal reflux disease with hiatal hernia     Stacy Gardner, PT, DPT 05/30/2309:02 AM   [facility] @ [place], [place], [address] Phone: [phone]   Fax:  [phone]  Name: Carrie Elliott MRN: [id number] Date of Birth: November 27, 1950

## 2021-06-01 ENCOUNTER — Other Ambulatory Visit (INDEPENDENT_AMBULATORY_CARE_PROVIDER_SITE_OTHER): Payer: Medicare Other

## 2021-06-01 DIAGNOSIS — I10 Essential (primary) hypertension: Secondary | ICD-10-CM

## 2021-06-01 DIAGNOSIS — E538 Deficiency of other specified B group vitamins: Secondary | ICD-10-CM

## 2021-06-01 DIAGNOSIS — E785 Hyperlipidemia, unspecified: Secondary | ICD-10-CM

## 2021-06-01 DIAGNOSIS — D509 Iron deficiency anemia, unspecified: Secondary | ICD-10-CM

## 2021-06-01 DIAGNOSIS — R739 Hyperglycemia, unspecified: Secondary | ICD-10-CM

## 2021-06-01 DIAGNOSIS — E559 Vitamin D deficiency, unspecified: Secondary | ICD-10-CM | POA: Diagnosis not present

## 2021-06-01 LAB — COMPREHENSIVE METABOLIC PANEL
ALT: 17 U/L (ref 0–35)
AST: 18 U/L (ref 0–37)
Albumin: 4.1 g/dL (ref 3.5–5.2)
Alkaline Phosphatase: 48 U/L (ref 39–117)
BUN: 16 mg/dL (ref 6–23)
CO2: 27 mEq/L (ref 19–32)
Calcium: 9.3 mg/dL (ref 8.4–10.5)
Chloride: 102 mEq/L (ref 96–112)
Creatinine, Ser: 0.85 mg/dL (ref 0.40–1.20)
GFR: 69.28 mL/min (ref >60.00)
Glucose, Bld: 89 mg/dL (ref 70–99)
Potassium: 4.2 mEq/L (ref 3.5–5.1)
Sodium: 136 mEq/L (ref 135–145)
Total Bilirubin: 0.5 mg/dL (ref 0.2–1.2)
Total Protein: 6.7 g/dL (ref 6.0–8.3)

## 2021-06-01 LAB — CBC WITH DIFFERENTIAL/PLATELET
Basophils Absolute: 0 10*3/uL (ref 0.0–0.1)
Basophils Relative: 1 % (ref 0.0–3.0)
Eosinophils Absolute: 0.1 10*3/uL (ref 0.0–0.7)
Eosinophils Relative: 2.4 % (ref 0.0–5.0)
HCT: 35.9 % — ABNORMAL LOW (ref 36.0–46.0)
Hemoglobin: 12.1 g/dL (ref 12.0–15.0)
Lymphocytes Relative: 25.8 % (ref 12.0–46.0)
Lymphs Abs: 1.3 10*3/uL (ref 0.7–4.0)
MCHC: 33.8 g/dL (ref 30.0–36.0)
MCV: 90.3 fl (ref 78.0–100.0)
Monocytes Absolute: 0.5 10*3/uL (ref 0.1–1.0)
Monocytes Relative: 9.5 % (ref 3.0–12.0)
Neutro Abs: 3.2 10*3/uL (ref 1.4–7.7)
Neutrophils Relative %: 61.3 % (ref 43.0–77.0)
Platelets: 221 10*3/uL (ref 150.0–400.0)
RBC: 3.98 Mil/uL (ref 3.87–5.11)
RDW: 13.2 % (ref 11.5–15.5)
WBC: 5.1 10*3/uL (ref 4.0–10.5)

## 2021-06-01 LAB — TSH: TSH: 1.12 u[IU]/mL (ref 0.35–5.50)

## 2021-06-01 LAB — LIPID PANEL
Cholesterol: 211 mg/dL — ABNORMAL HIGH (ref 0–200)
HDL: 60 mg/dL (ref >39.00)
LDL Cholesterol: 119 mg/dL — ABNORMAL HIGH (ref 0–99)
NonHDL: 150.64
Total CHOL/HDL Ratio: 4
Triglycerides: 160 mg/dL — ABNORMAL HIGH (ref 0.0–149.0)
VLDL: 32 mg/dL (ref 0.0–40.0)

## 2021-06-01 LAB — VITAMIN B12: Vitamin B-12: 433 pg/mL (ref 211–911)

## 2021-06-01 LAB — HEMOGLOBIN A1C: Hgb A1c MFr Bld: 5.7 % (ref 4.6–6.5)

## 2021-06-01 LAB — VITAMIN D 25 HYDROXY (VIT D DEFICIENCY, FRACTURES): VITD: 36.63 ng/mL (ref 30.00–100.00)

## 2021-06-07 ENCOUNTER — Ambulatory Visit: Payer: Medicare Other | Admitting: Physical Therapy

## 2021-06-07 ENCOUNTER — Encounter: Payer: Self-pay | Admitting: Physical Therapy

## 2021-06-07 ENCOUNTER — Other Ambulatory Visit: Payer: Self-pay

## 2021-06-07 DIAGNOSIS — R279 Unspecified lack of coordination: Secondary | ICD-10-CM | POA: Diagnosis not present

## 2021-06-07 DIAGNOSIS — R293 Abnormal posture: Secondary | ICD-10-CM | POA: Diagnosis not present

## 2021-06-07 DIAGNOSIS — R278 Other lack of coordination: Secondary | ICD-10-CM | POA: Diagnosis not present

## 2021-06-07 DIAGNOSIS — M6281 Muscle weakness (generalized): Secondary | ICD-10-CM | POA: Diagnosis not present

## 2021-06-07 NOTE — Therapy (Signed)
[facility] ?[place], [place], [address] ?Phone: [phone]   Fax:  [phone] ? ?Physical Therapy Treatment ? ?Patient Details  ?Name: Carrie Elliott ?MRN: [id number] ?Date of Birth: 1951-02-05 ?Referring Provider (PT): Megan Salon, MD ? ? ?Encounter Date: 06/07/2021 ? ? PT End of Session - 06/07/21 1100   ? ? Visit Number 5   ? Number of Visits 16   ? Date for PT Re-Evaluation 08/05/21   ? Authorization Type Medicare   ? Progress Note Due on Visit 10   ? PT Start Time 1100   ? PT Stop Time 1140   ? PT Time Calculation (min) 40 min   ? Activity Tolerance Patient tolerated treatment well   ? Behavior During Therapy [facility] for tasks assessed/performed   ? ?  ?  ? ?  ? ? ?Past Medical History:  ?Diagnosis Date  ? Allergy   ? Arthritis   ? 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 Benign paroxysmal positional vertigo 07/12/2013  ? Cystocele   ? grade 2  ? Endometriosis   ? with Menometrorhaghia   ? Gastric polyps 09/30/2015  ? GERD (gastroesophageal reflux disease)   ? Hemorrhoids   ? History of colonic polyps 09/30/2015  ? adenoma  ? HTN (hypertension) 04/23/2013  ? 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 Osteopenia 10/09/2015  ? Sternal fracture   ? 1986  ? Vitamin D deficiency 04/06/2016  ? 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 ? ?Past Surgical History:  ?Procedure Laterality Date  ? APPENDECTOMY  1989  ? atypical nevus excision    ? BREAST BIOPSY Right   ? CHOLECYSTECTOMY  1989  ? COLONOSCOPY WITH PROPOFOL N/A 05/27/2015  ? Procedure: COLONOSCOPY WITH PROPOFOL;  Surgeon: Clarene Essex, MD;  Location: [facility];  Service: Endoscopy;  Laterality: N/A;  ? [place] REMOVAL  1999  ? ESOPHAGOGASTRODUODENOSCOPY  2011  ? with esophageal stent, perforation   ? ESOPHAGOGASTRODUODENOSCOPY (EGD) WITH PROPOFOL N/A 05/27/2015  ? Procedure: ESOPHAGOGASTRODUODENOSCOPY (EGD) WITH PROPOFOL;  Surgeon: Clarene Essex, MD;  Location: [facility];  Service: Endoscopy;   Laterality: N/A;  ? HEMORRHOID SURGERY  2005  ? LEFT OOPHORECTOMY  6063  ? complicated by ureteral injuery requiring a ureteral stent   ? MMK / ANTERIOR VESICOURETHROPEXY / URETHROPEXY  2000  ? TONSILLECTOMY AND ADENOIDECTOMY    ? TOTAL ABDOMINAL HYSTERECTOMY  2000  ? with USO  ? TUBAL LIGATION  1981  ? wisdom teeth removal  1964  ? ? ?There were no vitals filed for this visit. ? ? Subjective Assessment - 06/07/21 1101   ? ? Subjective Pt reports she has continues to have no leakage but prolapse has been "off and on" with a lot of cleaning and lifting things the past week, "I feel like I haven't had as many episodes".   ? Pertinent History history of falls with injury, tearing with second child and episotomy with first   ? Limitations Standing;Walking;House hold activities;Other (comment)   ? Diagnostic tests 10/14/20- L knee Xrays 1. No fracture or dislocation of the right knee.  2. Mild tricompartmental osteoarthritis.  10/14/20- cervical/head CT- Moderate multilevel degenerative disc disease is noted in the  cervical spine. No acute abnormality is noted.   ? Patient Stated Goals to have less heaviness or bulge at pelvis   ? Currently in Pain? No/denies   ? ?  ?  ? ?  ? ? ? ? ? ? ? ? ? ? ? ? ? ? ? ? ? ? ? ?  [facility] Adult PT Treatment/Exercise - 06/07/21 0001   ? ?  ? Exercises  ? Exercises Lumbar;Knee/Hip   ?  ? Lumbar Exercises: Standing  ? Functional Squats 20 reps   ? Other Standing Lumbar Exercises lateral stepping red loop 6x10'; overhead diagonal pulldown with opp hip flexion x10 each 2#   ? Other Standing Lumbar Exercises 2x10 overhead punch 2# with coordinated breathing and pelvic floor; x10 standing unilateral marching with coordinating breathing and pelvic floor   ?  ? Lumbar Exercises: Seated  ? Sit to Stand 20 reps   ? Sit to Stand Limitations body weight with coordinating pelvic floor and breathing   ? Other Seated Lumbar Exercises opp arm/knee press into ball 2x10 each with coordinated breathing   ?  ?  Lumbar Exercises: Supine  ? Clam 20 reps   ? Clam Limitations red loop   ? Other Supine Lumbar Exercises 2x10 TA activations with exhale, improved with "balloon" breathing technique   ?  ? Lumbar Exercises: Sidelying  ? Other Sidelying Lumbar Exercises ball press 2x10 each side with exhale for improved TA engagement   ? ?  ?  ? ?  ? ? ? ? ? ? ? ? ? ? PT Education - 06/07/21 1141   ? ? Education Details Pt educated on coordination of pelvic floor and breathing with exercises   ? Person(s) Educated Patient   ? Methods Explanation;Demonstration;Tactile cues;Verbal cues   ? Comprehension Returned demonstration;Verbalized understanding   ? ?  ?  ? ?  ? ? ? PT Short Term Goals - 05/08/21 1601   ? ?  ? PT SHORT TERM GOAL #1  ? Title pt to be I with HEP   ? Time 4   ? Period Weeks   ? Status New   ? Target Date 06/05/21   ?  ? PT SHORT TERM GOAL #2  ? Title pt to demonstrate at least 3/5 pelvic floor strength with ability to hold contraction for 10s for improved pelvic floor strength and endurance for lifting without prolapse symptoms felt.   ? Time 4   ? Period Weeks   ? Status New   ? Target Date 06/05/21   ?  ? PT SHORT TERM GOAL #3  ? Title pt to demonstrate improved coordination with pelvic floor contract/relax with breathing mechanics during functional tasks at least 50% of the timefor decrease strain at prolapse.   ? Time 4   ? Period Weeks   ? Status New   ? Target Date 06/05/21   ? ?  ?  ? ?  ? ? ? ? PT Long Term Goals - 05/08/21 1603   ? ?  ? PT LONG TERM GOAL #1  ? Title pt to be I with advanced HEP   ? Time 3   ? Status New   ? Target Date 08/05/21   ?  ? PT LONG TERM GOAL #2  ? Title pt to demonstrate at least 4/5 pelvic floor strength with ability to hold contraction for 10s for improved pelvic floor strength and endurance for lifting without prolapse symptoms felt.   ? Time 3   ? Period Months   ? Status New   ? Target Date 08/05/21   ?  ? PT LONG TERM GOAL #3  ? Title pt to demonstrate improved coordination  with pelvic floor contract/relax with breathing mechanics during functional tasks at least 75% of the timefor decrease strain at prolapse.   ? Time  3   ? Period Months   ? Status New   ? Target Date 08/05/21   ?  ? PT LONG TERM GOAL #4  ? Title pt to demonstrate ability to lift 20# from ground level with proper lifting mechanics and breathing mechanics for decreased strain at pelvic floor   ? Time 3   ? Period Months   ? Status New   ? Target Date 08/05/21   ? ?  ?  ? ?  ? ? ? ? ? ? ? ? Plan - 06/07/21 1142   ? ? Clinical Impression Statement Pt presents to clinic reporting she continued to HEP, no leakage and very minimal instances of prolapse as compared to prior to PT was feeling prolapse much more often. Pt session focused on hip and core strengthening with coordination of pelvic floor and breathing with all exerises. Pt tolerated well with cues and extra time. Pt would benefit from additional PT to further address deficits.   ? Personal Factors and Comorbidities Time since onset of injury/illness/exacerbation   ? Examination-Activity Limitations Continence;Lift;Squat;Carry   ? Examination-Participation Restrictions Community Activity;Shop;Yard Work;Cleaning   ? Stability/Clinical Decision Making Stable/Uncomplicated   ? Rehab Potential Good   ? PT Frequency 1x / week   ? PT Duration Other (comment)   10 visits  ? PT Treatment/Interventions ADLs/Self Care Home Management;Aquatic Therapy;Cryotherapy;Moist Heat;Functional mobility training;Stair training;Therapeutic activities;Therapeutic exercise;Neuromuscular re-education;Manual techniques;Patient/family education;Taping;Scar mobilization;Passive range of motion;Energy conservation;Joint Manipulations   ? PT Next Visit Plan breathing/core/PF coordination   ? PT Home Exercise Plan [id number]   ? Consulted and Agree with Plan of Care Patient   ? ?  ?  ? ?  ? ? ?Patient will benefit from skilled therapeutic intervention in order to improve the following deficits and  impairments:  Decreased coordination, Decreased endurance, Increased fascial restricitons, Improper body mechanics, Impaired flexibility, Decreased scar mobility, Decreased mobility, Decreased strength, I

## 2021-06-08 ENCOUNTER — Other Ambulatory Visit (HOSPITAL_BASED_OUTPATIENT_CLINIC_OR_DEPARTMENT_OTHER): Payer: Self-pay

## 2021-06-08 ENCOUNTER — Ambulatory Visit (INDEPENDENT_AMBULATORY_CARE_PROVIDER_SITE_OTHER): Payer: Medicare Other | Admitting: Family Medicine

## 2021-06-08 ENCOUNTER — Encounter: Payer: Self-pay | Admitting: Family Medicine

## 2021-06-08 VITALS — BP 134/70 | HR 58 | Resp 20 | Ht 61.5 in | Wt 171.8 lb

## 2021-06-08 DIAGNOSIS — E559 Vitamin D deficiency, unspecified: Secondary | ICD-10-CM | POA: Diagnosis not present

## 2021-06-08 DIAGNOSIS — I10 Essential (primary) hypertension: Secondary | ICD-10-CM

## 2021-06-08 DIAGNOSIS — R739 Hyperglycemia, unspecified: Secondary | ICD-10-CM

## 2021-06-08 DIAGNOSIS — E538 Deficiency of other specified B group vitamins: Secondary | ICD-10-CM

## 2021-06-08 DIAGNOSIS — D500 Iron deficiency anemia secondary to blood loss (chronic): Secondary | ICD-10-CM

## 2021-06-08 DIAGNOSIS — K219 Gastro-esophageal reflux disease without esophagitis: Secondary | ICD-10-CM

## 2021-06-08 DIAGNOSIS — R296 Repeated falls: Secondary | ICD-10-CM | POA: Diagnosis not present

## 2021-06-08 DIAGNOSIS — K449 Diaphragmatic hernia without obstruction or gangrene: Secondary | ICD-10-CM | POA: Diagnosis not present

## 2021-06-08 DIAGNOSIS — E785 Hyperlipidemia, unspecified: Secondary | ICD-10-CM | POA: Diagnosis not present

## 2021-06-08 MED ORDER — LISINOPRIL 10 MG PO TABS
ORAL_TABLET | Freq: Two times a day (BID) | ORAL | 1 refills | Status: DC
  Filled 2021-06-08: qty 180, fill #0
  Filled 2021-08-09: qty 180, 90d supply, fill #0
  Filled 2021-11-01: qty 180, 90d supply, fill #1

## 2021-06-08 MED ORDER — FLUTICASONE PROPIONATE 50 MCG/ACT NA SUSP
2.0000 | Freq: Every day | NASAL | 6 refills | Status: DC
  Filled 2021-06-08: qty 16, 30d supply, fill #0
  Filled 2021-08-09: qty 16, 30d supply, fill #1
  Filled 2021-09-20: qty 16, 30d supply, fill #2
  Filled 2021-10-17: qty 16, 30d supply, fill #3
  Filled 2021-12-11: qty 16, 30d supply, fill #4
  Filled 2022-01-15: qty 16, 30d supply, fill #5
  Filled 2022-03-07: qty 16, 30d supply, fill #6

## 2021-06-08 NOTE — Assessment & Plan Note (Signed)
Supplement and monitor 

## 2021-06-08 NOTE — Patient Instructions (Signed)

## 2021-06-08 NOTE — Progress Notes (Signed)
? ?Subjective:  ? ? Patient ID: Carrie Elliott, female    DOB: Mar 13, 1951, 71 y.o.   MRN: [id number] ? ?Chief Complaint  ?Patient presents with  ? Follow-up  ? ? ?HPI ?Patient is in today for a 4 month follow up on chronic conditions. No recent febrile illness or hospitalizations. No recent falls or trauma. She has been undergoing physical therapy for vertigo and debility and she notes she is doing better. No endorsement of any headache or new concerns. Denies CP/palp/SOB/HA/congestion/fevers/GI or GU c/o. Taking meds as prescribed  ? ?Past Medical History:  ?Diagnosis Date  ? Allergy   ? Arthritis   ? 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 Benign paroxysmal positional vertigo 07/12/2013  ? Cystocele   ? grade 2  ? Endometriosis   ? with Menometrorhaghia   ? Gastric polyps 09/30/2015  ? GERD (gastroesophageal reflux disease)   ? Hemorrhoids   ? History of colonic polyps 09/30/2015  ? adenoma  ? HTN (hypertension) 04/23/2013  ? 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 Osteopenia 10/09/2015  ? Sternal fracture   ? 1986  ? Vitamin D deficiency 04/06/2016  ? 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 ? ?Past Surgical History:  ?Procedure Laterality Date  ? APPENDECTOMY  1989  ? atypical nevus excision    ? BREAST BIOPSY Right   ? CHOLECYSTECTOMY  1989  ? COLONOSCOPY WITH PROPOFOL N/A 05/27/2015  ? Procedure: COLONOSCOPY WITH PROPOFOL;  Surgeon: Clarene Essex, MD;  Location: [facility];  Service: Endoscopy;  Laterality: N/A;  ? [place] REMOVAL  1999  ? ESOPHAGOGASTRODUODENOSCOPY  2011  ? with esophageal stent, perforation   ? ESOPHAGOGASTRODUODENOSCOPY (EGD) WITH PROPOFOL N/A 05/27/2015  ? Procedure: ESOPHAGOGASTRODUODENOSCOPY (EGD) WITH PROPOFOL;  Surgeon: Clarene Essex, MD;  Location: [facility];  Service: Endoscopy;  Laterality: N/A;  ? HEMORRHOID SURGERY  2005  ? LEFT OOPHORECTOMY  9470  ? complicated by ureteral injuery requiring a ureteral stent   ? MMK / ANTERIOR VESICOURETHROPEXY / URETHROPEXY  2000  ? TONSILLECTOMY  AND ADENOIDECTOMY    ? TOTAL ABDOMINAL HYSTERECTOMY  2000  ? with USO  ? TUBAL LIGATION  1981  ? wisdom teeth removal  1964  ? ? ?Family History  ?Problem Relation Age of Onset  ? Colon polyps Father   ? Hypertension Father   ? Heart failure Father   ? Melanoma Father   ?     multiple  ? Heart attack Father   ?     x2  ? Heart disease Father   ?     Bundle Block  ? Meniere's disease Father   ? Cancer Father   ?     melanoma  ? Osteoporosis Mother   ? Transient ischemic attack Mother   ? Cancer Mother   ?     colon  ? Heart disease Mother   ?     afib  ? Hyperlipidemia Mother   ? Diabetes Paternal Grandmother   ? Stroke Maternal Grandmother   ? Cancer Maternal Grandmother   ?     skin, melanoma, sarcoma  ? Colon polyps Maternal Grandmother   ? Diverticulitis Maternal Grandmother   ? Stroke Maternal Grandfather   ? Heart disease Maternal Grandfather   ? Obesity Brother   ? ? ?Social History  ? ?Socioeconomic History  ? Marital status: Married  ?  Spouse name: Not on file  ? Number of children: Not on file  ? Years of education: Not on  file  ? Highest education level: Not on file  ?Occupational History  ? Not on file  ?Tobacco Use  ? Smoking status: Never  ? Smokeless tobacco: Never  ?Vaping Use  ? Vaping Use: Never used  ?Substance and Sexual Activity  ? Alcohol use: No  ? Drug use: No  ? Sexual activity: Yes  ?  Birth control/protection: Surgical  ?  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 Not on file  ?Social History Narrative  ? Not on file  ? ?Social Determinants of Health  ? ?Financial Resource Strain: Low Risk   ? Difficulty of Paying Living Expenses: Not hard at all  ?Food Insecurity: No Food Insecurity  ? Worried About Charity fundraiser in the Last Year: Never true  ? Ran Out of Food in the Last Year: Never true  ?Transportation Needs: No Transportation Needs  ? Lack of Transportation (Medical): No  ? Lack of Transportation (Non-Medical): No  ?Physical  Activity: Inactive  ? Days of Exercise per Week: 0 days  ? Minutes of Exercise per Session: 0 min  ?Stress: No Stress Concern Present  ? Feeling of Stress : Not at all  ?Social Connections: Socially Integrated  ? Frequency of Communication with Friends and Family: More than three times a week  ? Frequency of Social Gatherings with Friends and Family: More than three times a week  ? Attends Religious Services: More than 4 times per year  ? Active Member of Clubs or Organizations: Yes  ? Attends Archivist Meetings: More than 4 times per year  ? Marital Status: Married  ?Intimate Partner Violence: Not At Risk  ? Fear of Current or Ex-Partner: No  ? Emotionally Abused: No  ? Physically Abused: No  ? Sexually Abused: No  ? ? ?Outpatient Medications Prior to Visit  ?Medication Sig Dispense Refill  ? benzocaine (ORAJEL) 10 % mucosal gel Use as directed 1 application in the mouth or throat as needed for mouth pain. Reported on 07/22/2015    ? Cholecalciferol (VITAMIN D3) 50 MCG (2000 UT) TABS Take by mouth. Take 1 tablet daily Monday-Friday. Take 2 tablets on Saturday.    ? esomeprazole (NEXIUM) 40 MG capsule TAKE 1 CAPSULE BY MOUTH ONCE A DAY 90 capsule 3  ? esomeprazole (NEXIUM) 40 MG capsule Take 1 capsule by mouth once a day 90 capsule 4  ? famotidine (PEPCID) 20 MG tablet Take 1 tablet by mouth twice a day as needed 180 tablet 2  ? hydrochlorothiazide (MICROZIDE) 12.5 MG capsule TAKE 1 CAPSULE (12.5 MG TOTAL) BY MOUTH DAILY. TAKE IF > 110/70 90 capsule 1  ? Lidocaine-Hydrocort, Perianal, 3-0.5 % CREA Apply rectally as directed once a day if needed 98 g 2  ? Lidocaine-Hydrocortisone Ace 3-0.5 % CREA Apply 1 application topically daily as needed (hemmoriods.).     ? metoprolol succinate (TOPROL-XL) 50 MG 24 hr tablet TAKE 1 - 2 TABLETS BY MOUTH TWO TIMES DAILY WITH OR IMMEDIATELY FOLLOWING A MEAL 360 tablet 1  ? ondansetron (ZOFRAN) 8 MG tablet Take 1 tablet (8 mg total) by mouth every 8 (eight) hours as needed  for nausea or vomiting. 90 tablet 1  ? SIMPLY SALINE NA Place 1 each into the nose daily.     ? fluticasone (FLONASE) 50 MCG/ACT nasal spray Place 2 sprays into both nostrils daily. 16 g 6  ? lisinopril (ZESTRIL) 10 MG tablet TAKE 1 TABLET (10 MG TOTAL)  BY MOUTH 2 (TWO) TIMES DAILY. 180 tablet 1  ? famotidine (PEPCID) 20 MG tablet TAKE 1 TABLET BY MOUTH AT BEDTIME AS NEEDED TWICE A DAY 60 tablet 3  ? ?No facility-administered medications prior to visit.  ? ? ?Allergies  ?Allergen Reactions  ? Pseudoephedrine Hcl Er Other (See Comments)  ?  TACHYCARDIA   ? Singulair [Montelukast Sodium] Other (See Comments)  ?  Mental status changes, irritable  ? Cefdinir Hives  ? Celebrex [Celecoxib] Other (See Comments)  ?  ABDOMINAL PAIN   ? Ciprofloxacin Other (See Comments)  ?  ABDOMINAL PAIN   ? Codeine Nausea And Vomiting  ?  NAUSEA VOMITING - can take with zofran  ? ? ?Review of Systems  ?Constitutional:  Negative for chills, fever and malaise/fatigue.  ?HENT:  Negative for congestion and hearing loss.   ?Eyes:  Negative for discharge.  ?Respiratory:  Negative for cough, sputum production and shortness of breath.   ?Cardiovascular:  Negative for chest pain, palpitations and leg swelling.  ?Gastrointestinal:  Negative for abdominal pain, blood in stool, constipation, diarrhea, heartburn, nausea and vomiting.  ?Genitourinary:  Negative for dysuria, frequency, hematuria and urgency.  ?Musculoskeletal:  Negative for back pain, falls and myalgias.  ?Skin:  Negative for rash.  ?Neurological:  Negative for dizziness, sensory change, loss of consciousness, weakness and headaches.  ?Endo/Heme/Allergies:  Negative for environmental allergies. Does not bruise/bleed easily.  ?Psychiatric/Behavioral:  Negative for depression and suicidal ideas. The patient is not nervous/anxious and does not have insomnia.   ? ?   ?Objective:  ?  ?Physical Exam ?Constitutional:   ?   General: She is not in acute distress. ?   Appearance: She is  well-developed.  ?HENT:  ?   Head: Normocephalic and atraumatic.  ?Eyes:  ?   Conjunctiva/sclera: Conjunctivae normal.  ?Neck:  ?   Thyroid: No thyromegaly.  ?Cardiovascular:  ?   Rate and Rhythm: Normal rate and

## 2021-06-08 NOTE — Assessment & Plan Note (Signed)
Follows with Dr Watt Climes of gastroenterology. Stable on Esomeprazole and Famotidine.  ?

## 2021-06-08 NOTE — Assessment & Plan Note (Signed)
Well controlled, no changes to meds. Encouraged heart healthy diet such as the DASH diet and exercise as tolerated.  °

## 2021-06-11 NOTE — Assessment & Plan Note (Signed)
No falls since starting physical therapy. She has been treated for vertigo and debility and she is doing better since she started managing these.  ?

## 2021-06-14 ENCOUNTER — Other Ambulatory Visit: Payer: Self-pay

## 2021-06-14 ENCOUNTER — Ambulatory Visit: Payer: Medicare Other | Admitting: Physical Therapy

## 2021-06-14 DIAGNOSIS — M6281 Muscle weakness (generalized): Secondary | ICD-10-CM

## 2021-06-14 DIAGNOSIS — R279 Unspecified lack of coordination: Secondary | ICD-10-CM | POA: Diagnosis not present

## 2021-06-14 DIAGNOSIS — R293 Abnormal posture: Secondary | ICD-10-CM

## 2021-06-14 DIAGNOSIS — R278 Other lack of coordination: Secondary | ICD-10-CM | POA: Diagnosis not present

## 2021-06-14 NOTE — Therapy (Signed)
?[facility] [place] ?[place], [place], [address] ?Phone: [phone]   Fax:  [phone] ? ?Physical Therapy Treatment ? ?Patient Details  ?Name: Carrie Elliott ?MRN: [id number] ?Date of Birth: July 19, 1950 ?Referring Provider (PT): Megan Salon, MD ? ? ?Encounter Date: 06/14/2021 ? ? PT End of Session - 06/14/21 1021   ? ? Visit Number 6   ? Number of Visits 16   ? Date for PT Re-Evaluation 08/05/21   ? Authorization Type Medicare   ? Progress Note Due on Visit 10   ? PT Start Time 1018   ? PT Stop Time 1100   ? PT Time Calculation (min) 42 min   ? Activity Tolerance Patient tolerated treatment well   ? Behavior During Therapy [facility] for tasks assessed/performed   ? ?  ?  ? ?  ? ? ?Past Medical History:  ?Diagnosis Date  ? Allergy   ? Arthritis   ? 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 Benign paroxysmal positional vertigo 07/12/2013  ? Cystocele   ? grade 2  ? Endometriosis   ? with Menometrorhaghia   ? Gastric polyps 09/30/2015  ? GERD (gastroesophageal reflux disease)   ? Hemorrhoids   ? History of colonic polyps 09/30/2015  ? adenoma  ? HTN (hypertension) 04/23/2013  ? 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 Osteopenia 10/09/2015  ? Sternal fracture   ? 1986  ? Vitamin D deficiency 04/06/2016  ? 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 ? ?Past Surgical History:  ?Procedure Laterality Date  ? APPENDECTOMY  1989  ? atypical nevus excision    ? BREAST BIOPSY Right   ? CHOLECYSTECTOMY  1989  ? COLONOSCOPY WITH PROPOFOL N/A 05/27/2015  ? Procedure: COLONOSCOPY WITH PROPOFOL;  Surgeon: Clarene Essex, MD;  Location: [facility];  Service: Endoscopy;  Laterality: N/A;  ? [place] REMOVAL  1999  ? ESOPHAGOGASTRODUODENOSCOPY  2011  ? with esophageal stent, perforation   ? ESOPHAGOGASTRODUODENOSCOPY (EGD) WITH PROPOFOL N/A 05/27/2015  ? Procedure: ESOPHAGOGASTRODUODENOSCOPY (EGD) WITH PROPOFOL;  Surgeon: Clarene Essex, MD;  Location: [facility];  Service: Endoscopy;   Laterality: N/A;  ? HEMORRHOID SURGERY  2005  ? LEFT OOPHORECTOMY  0938  ? complicated by ureteral injuery requiring a ureteral stent   ? MMK / ANTERIOR VESICOURETHROPEXY / URETHROPEXY  2000  ? TONSILLECTOMY AND ADENOIDECTOMY    ? TOTAL ABDOMINAL HYSTERECTOMY  2000  ? with USO  ? TUBAL LIGATION  1981  ? wisdom teeth removal  1964  ? ? ?There were no vitals filed for this visit. ? ? Subjective Assessment - 06/14/21 1023   ? ? Subjective Pt reports no leakage, have only noticed prolapse once since last session.   ? Pertinent History history of falls with injury, tearing with second child and episotomy with first   ? Limitations Standing;Walking;House hold activities;Other (comment)   ? Diagnostic tests 10/14/20- L knee Xrays 1. No fracture or dislocation of the right knee.  2. Mild tricompartmental osteoarthritis.  10/14/20- cervical/head CT- Moderate multilevel degenerative disc disease is noted in the  cervical spine. No acute abnormality is noted.   ? Patient Stated Goals to have less heaviness or bulge at pelvis   ? ?  ?  ? ?  ? ? ? ? ? ? ? ? ? ? ? ? ? ? ? ? ? ? ? ? [facility] Adult PT Treatment/Exercise - 06/14/21 0001   ? ?  ? Exercises  ?  Exercises Knee/Hip;Lumbar   ?  ? Lumbar Exercises: Standing  ? Other Standing Lumbar Exercises palloffs x10 green band   ? Other Standing Lumbar Exercises alt hip flexion with holding 4# bil arm extension x10 then 2# x10   ?  ? Lumbar Exercises: Seated  ? Sit to Stand 20 reps   ? Sit to Stand Limitations body weight with coordinating pelvic floor and breathing   ?  ? Lumbar Exercises: Supine  ? Bridge with [employer] 20 reps   ? Other Supine Lumbar Exercises ball press with opp hand/knee 2x10 each with exhale   ?  ? Lumbar Exercises: Sidelying  ? Other Sidelying Lumbar Exercises ball press 2x10 each side with exhale for improved TA engagement   ? ?  ?  ? ?  ? ? ? ? ? ? ? ? ? ? ? ? PT Short Term Goals - 05/08/21 1601   ? ?  ? PT SHORT TERM GOAL #1  ? Title pt to be I with HEP   ?  Time 4   ? Period Weeks   ? Status New   ? Target Date 06/05/21   ?  ? PT SHORT TERM GOAL #2  ? Title pt to demonstrate at least 3/5 pelvic floor strength with ability to hold contraction for 10s for improved pelvic floor strength and endurance for lifting without prolapse symptoms felt.   ? Time 4   ? Period Weeks   ? Status New   ? Target Date 06/05/21   ?  ? PT SHORT TERM GOAL #3  ? Title pt to demonstrate improved coordination with pelvic floor contract/relax with breathing mechanics during functional tasks at least 50% of the timefor decrease strain at prolapse.   ? Time 4   ? Period Weeks   ? Status New   ? Target Date 06/05/21   ? ?  ?  ? ?  ? ? ? ? PT Long Term Goals - 05/08/21 1603   ? ?  ? PT LONG TERM GOAL #1  ? Title pt to be I with advanced HEP   ? Time 3   ? Status New   ? Target Date 08/05/21   ?  ? PT LONG TERM GOAL #2  ? Title pt to demonstrate at least 4/5 pelvic floor strength with ability to hold contraction for 10s for improved pelvic floor strength and endurance for lifting without prolapse symptoms felt.   ? Time 3   ? Period Months   ? Status New   ? Target Date 08/05/21   ?  ? PT LONG TERM GOAL #3  ? Title pt to demonstrate improved coordination with pelvic floor contract/relax with breathing mechanics during functional tasks at least 75% of the timefor decrease strain at prolapse.   ? Time 3   ? Period Months   ? Status New   ? Target Date 08/05/21   ?  ? PT LONG TERM GOAL #4  ? Title pt to demonstrate ability to lift 20# from ground level with proper lifting mechanics and breathing mechanics for decreased strain at pelvic floor   ? Time 3   ? Period Months   ? Status New   ? Target Date 08/05/21   ? ?  ?  ? ?  ? ? ? ? ? ? ? ? Plan - 06/14/21 1108   ? ? Clinical Impression Statement pt presents to clinic continues to do HEP and attempt recommendations with prolapse relief  positions as needed and bladder voids as needed. Pt continues to have no leakage at all, only had one instance of  symptoms with prolapse since last visit and this was at the end of a more active day and pt reports she was tired and didn't do the prolase relief positions but they did resolve with rest and weren't as bad. Pt also reports she hasn't noticed need to double void recently. Pt session focused on hip/core strengthening exercises with coordination of pelvic floor for all exercises, HEP updated and PT went over these and completed them in clinic for carry over. Increased resistance given during exercises for added challenge and progression. Pt tolerated well, denied additional questions.   ? Personal Factors and Comorbidities Time since onset of injury/illness/exacerbation   ? Examination-Activity Limitations Continence;Lift;Squat;Carry   ? Examination-Participation Restrictions Community Activity;Shop;Yard Work;Cleaning   ? Stability/Clinical Decision Making Stable/Uncomplicated   ? Rehab Potential Good   ? PT Frequency 1x / week   ? PT Duration Other (comment)   10 visits  ? PT Treatment/Interventions ADLs/Self Care Home Management;Aquatic Therapy;Cryotherapy;Moist Heat;Functional mobility training;Stair training;Therapeutic activities;Therapeutic exercise;Neuromuscular re-education;Manual techniques;Patient/family education;Taping;Scar mobilization;Passive range of motion;Energy conservation;Joint Manipulations   ? PT Next Visit Plan breathing/core/PF coordination   ? PT Home Exercise Plan [id number]   ? Consulted and Agree with Plan of Care Patient   ? ?  ?  ? ?  ? ? ?Patient will benefit from skilled therapeutic intervention in order to improve the following deficits and impairments:  Decreased coordination, Decreased endurance, Increased fascial restricitons, Improper body mechanics, Impaired flexibility, Decreased scar mobility, Decreased mobility, Decreased strength, Impaired sensation, Postural dysfunction ? ?Visit Diagnosis: ?Muscle weakness (generalized) ? ?Abnormal posture ? ?Unspecified lack of  coordination ? ? ? ? ?Problem List ?Patient Active Problem List  ? Diagnosis Date Noted  ? Left knee pain 01/22/2021  ? Recurrent falls 01/22/2021  ? Cystocele, unspecified (CODE) 10/16/2020  ? Insomnia 10/16/2020  ? Eyelid ab

## 2021-06-20 ENCOUNTER — Other Ambulatory Visit: Payer: Self-pay

## 2021-06-20 ENCOUNTER — Ambulatory Visit: Payer: Medicare Other | Admitting: Physical Therapy

## 2021-06-20 DIAGNOSIS — R279 Unspecified lack of coordination: Secondary | ICD-10-CM | POA: Diagnosis not present

## 2021-06-20 DIAGNOSIS — M6281 Muscle weakness (generalized): Secondary | ICD-10-CM | POA: Diagnosis not present

## 2021-06-20 DIAGNOSIS — R293 Abnormal posture: Secondary | ICD-10-CM | POA: Diagnosis not present

## 2021-06-20 DIAGNOSIS — R278 Other lack of coordination: Secondary | ICD-10-CM | POA: Diagnosis not present

## 2021-06-20 NOTE — Therapy (Signed)
[facility] [place] ?[place], [place], [address] ?Phone: [phone]   Fax:  [phone] ? ?Physical Therapy Treatment ? ?Patient Details  ?Name: Carrie Elliott ?MRN: [id number] ?Date of Birth: 1950-09-05 ?Referring Provider (PT): Megan Salon, MD ? ? ?Encounter Date: 06/20/2021 ? ? PT End of Session - 06/20/21 1233   ? ? Visit Number 7   ? Number of Visits 16   ? Date for PT Re-Evaluation 08/05/21   ? Authorization Type Medicare   ? Progress Note Due on Visit 10   ? PT Start Time 1231   ? PT Stop Time 1310   ? PT Time Calculation (min) 39 min   ? Activity Tolerance Patient tolerated treatment well   ? Behavior During Therapy [facility] for tasks assessed/performed   ? ?  ?  ? ?  ? ? ?Past Medical History:  ?Diagnosis Date  ? Allergy   ? Arthritis   ? 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 Benign paroxysmal positional vertigo 07/12/2013  ? Cystocele   ? grade 2  ? Endometriosis   ? with Menometrorhaghia   ? Gastric polyps 09/30/2015  ? GERD (gastroesophageal reflux disease)   ? Hemorrhoids   ? History of colonic polyps 09/30/2015  ? adenoma  ? HTN (hypertension) 04/23/2013  ? 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 Osteopenia 10/09/2015  ? Sternal fracture   ? 1986  ? Vitamin D deficiency 04/06/2016  ? 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 ? ?Past Surgical History:  ?Procedure Laterality Date  ? APPENDECTOMY  1989  ? atypical nevus excision    ? BREAST BIOPSY Right   ? CHOLECYSTECTOMY  1989  ? COLONOSCOPY WITH PROPOFOL N/A 05/27/2015  ? Procedure: COLONOSCOPY WITH PROPOFOL;  Surgeon: Clarene Essex, MD;  Location: [facility];  Service: Endoscopy;  Laterality: N/A;  ? [place] REMOVAL  1999  ? ESOPHAGOGASTRODUODENOSCOPY  2011  ? with esophageal stent, perforation   ? ESOPHAGOGASTRODUODENOSCOPY (EGD) WITH PROPOFOL N/A 05/27/2015  ? Procedure: ESOPHAGOGASTRODUODENOSCOPY (EGD) WITH PROPOFOL;  Surgeon: Clarene Essex, MD;  Location: [facility];  Service: Endoscopy;   Laterality: N/A;  ? HEMORRHOID SURGERY  2005  ? LEFT OOPHORECTOMY  7619  ? complicated by ureteral injuery requiring a ureteral stent   ? MMK / ANTERIOR VESICOURETHROPEXY / URETHROPEXY  2000  ? TONSILLECTOMY AND ADENOIDECTOMY    ? TOTAL ABDOMINAL HYSTERECTOMY  2000  ? with USO  ? TUBAL LIGATION  1981  ? wisdom teeth removal  1964  ? ? ?There were no vitals filed for this visit. ? ? Subjective Assessment - 06/20/21 1236   ? ? Subjective Pt reports continued no leakage and only had prolapse symptoms with scrubing bathroom floor from standing/bent over.   ? Pertinent History history of falls with injury, tearing with second child and episotomy with first   ? Limitations Standing;Walking;House hold activities;Other (comment)   ? Diagnostic tests 10/14/20- L knee Xrays 1. No fracture or dislocation of the right knee.  2. Mild tricompartmental osteoarthritis.  10/14/20- cervical/head CT- Moderate multilevel degenerative disc disease is noted in the  cervical spine. No acute abnormality is noted.   ? Patient Stated Goals to have less heaviness or bulge at pelvis   ? Currently in Pain? No/denies   ? ?  ?  ? ?  ? ? ? ? ? ? ? ? ? ? ? ? ? ? ? ? ? ? ? ? [facility] Adult PT Treatment/Exercise -  06/20/21 0001   ? ?  ? Exercises  ? Exercises Lumbar;Knee/Hip   ?  ? Lumbar Exercises: Standing  ? Other Standing Lumbar Exercises shoulder taps 2x10 modifed quad at wall   ?  ? Lumbar Exercises: Seated  ? Sit to Stand 20 reps   ? Sit to Stand Limitations 5# DB x10 fully sitting; x10 lightly sitting at mat   ?  ? Lumbar Exercises: Supine  ? Bridge with [employer] 20 reps   ? Other Supine Lumbar Exercises 90/90 heel taps 2x10 each   ? Other Supine Lumbar Exercises bil tricep extension blue band 2x10   ? ?  ?  ? ?  ? ? ? ? ? ? ? ? ? ? ? ? PT Short Term Goals - 05/08/21 1601   ? ?  ? PT SHORT TERM GOAL #1  ? Title pt to be I with HEP   ? Time 4   ? Period Weeks   ? Status New   ? Target Date 06/05/21   ?  ? PT SHORT TERM GOAL #2  ? Title pt to  demonstrate at least 3/5 pelvic floor strength with ability to hold contraction for 10s for improved pelvic floor strength and endurance for lifting without prolapse symptoms felt.   ? Time 4   ? Period Weeks   ? Status New   ? Target Date 06/05/21   ?  ? PT SHORT TERM GOAL #3  ? Title pt to demonstrate improved coordination with pelvic floor contract/relax with breathing mechanics during functional tasks at least 50% of the timefor decrease strain at prolapse.   ? Time 4   ? Period Weeks   ? Status New   ? Target Date 06/05/21   ? ?  ?  ? ?  ? ? ? ? PT Long Term Goals - 05/08/21 1603   ? ?  ? PT LONG TERM GOAL #1  ? Title pt to be I with advanced HEP   ? Time 3   ? Status New   ? Target Date 08/05/21   ?  ? PT LONG TERM GOAL #2  ? Title pt to demonstrate at least 4/5 pelvic floor strength with ability to hold contraction for 10s for improved pelvic floor strength and endurance for lifting without prolapse symptoms felt.   ? Time 3   ? Period Months   ? Status New   ? Target Date 08/05/21   ?  ? PT LONG TERM GOAL #3  ? Title pt to demonstrate improved coordination with pelvic floor contract/relax with breathing mechanics during functional tasks at least 75% of the timefor decrease strain at prolapse.   ? Time 3   ? Period Months   ? Status New   ? Target Date 08/05/21   ?  ? PT LONG TERM GOAL #4  ? Title pt to demonstrate ability to lift 20# from ground level with proper lifting mechanics and breathing mechanics for decreased strain at pelvic floor   ? Time 3   ? Period Months   ? Status New   ? Target Date 08/05/21   ? ?  ?  ? ?  ? ? ? ? ? ? ? ? Plan - 06/20/21 1313   ? ? Clinical Impression Statement Pt presents to clinic reporting continued improvement with no leakage and minimal prolapse symptoms. Pt session focused on hip and core strengthening with coordination of pelvic floor and breathing for all exercises, minimal cues  with coordination needed. Pt tolerated well. Progression with increased resistance with  activities. Pt tolerated well, denied additional questions.   ? Personal Factors and Comorbidities Time since onset of injury/illness/exacerbation   ? Examination-Activity Limitations Continence;Lift;Squat;Carry   ? Examination-Participation Restrictions Community Activity;Shop;Yard Work;Cleaning   ? Stability/Clinical Decision Making Stable/Uncomplicated   ? Rehab Potential Good   ? PT Frequency 1x / week   ? PT Duration Other (comment)   10 visits  ? PT Treatment/Interventions ADLs/Self Care Home Management;Aquatic Therapy;Cryotherapy;Moist Heat;Functional mobility training;Stair training;Therapeutic activities;Therapeutic exercise;Neuromuscular re-education;Manual techniques;Patient/family education;Taping;Scar mobilization;Passive range of motion;Energy conservation;Joint Manipulations   ? PT Next Visit Plan breathing/core/PF coordination   ? PT Home Exercise Plan [email or web address]   ? Consulted and Agree with Plan of Care Patient   ? ?  ?  ? ?  ? ? ?Patient will benefit from skilled therapeutic intervention in order to improve the following deficits and impairments:  Decreased coordination, Decreased endurance, Increased fascial restricitons, Improper body mechanics, Impaired flexibility, Decreased scar mobility, Decreased mobility, Decreased strength, Impaired sensation, Postural dysfunction ? ?Visit Diagnosis: ?Muscle weakness (generalized) ? ?Unspecified lack of coordination ? ?Abnormal posture ? ? ? ? ?Problem List ?Patient Active Problem List  ? Diagnosis Date Noted  ? Left knee pain 01/22/2021  ? Recurrent falls 01/22/2021  ? Cystocele, unspecified (CODE) 10/16/2020  ? Insomnia 10/16/2020  ? Eyelid abnormality 10/16/2020  ? Diverticulosis 12/09/2019  ? Iron deficiency anemia 06/11/2019  ? 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 Sleep apnea 09/04/2018  ? Vitamin B12 deficiency 02/18/2018  ? Bruit of right carotid artery 05/26/2017  ? Vertigo 11/23/2016  ? Vitamin D deficiency 04/06/2016  ? Osteopenia 10/09/2015  ? Vocal  cord polyp 09/30/2015  ? History of colonic polyps 09/30/2015  ? Gastric polyps 09/30/2015  ? Hip pain 09/05/2014  ? Urinary frequency 09/05/2014  ? Preventative health care 09/05/2014  ? Sun-damaged skin 06

## 2021-06-21 ENCOUNTER — Ambulatory Visit: Payer: Medicare Other | Admitting: Physical Therapy

## 2021-06-22 ENCOUNTER — Ambulatory Visit: Payer: Medicare Other | Admitting: Physical Therapy

## 2021-06-28 ENCOUNTER — Ambulatory Visit: Payer: Medicare Other | Attending: Obstetrics and Gynecology | Admitting: Physical Therapy

## 2021-06-28 DIAGNOSIS — M6281 Muscle weakness (generalized): Secondary | ICD-10-CM | POA: Insufficient documentation

## 2021-06-28 DIAGNOSIS — R279 Unspecified lack of coordination: Secondary | ICD-10-CM | POA: Insufficient documentation

## 2021-06-28 DIAGNOSIS — R293 Abnormal posture: Secondary | ICD-10-CM | POA: Insufficient documentation

## 2021-06-28 NOTE — Therapy (Signed)
[facility] ?[place], [place], [address] ?Phone: [phone]   Fax:  [phone] ? ?Physical Therapy Treatment ? ?Patient Details  ?Name: Carrie Elliott ?MRN: [id number] ?Date of Birth: 02-24-1951 ?Referring Provider (PT): Megan Salon, MD ? ? ?Encounter Date: 06/28/2021 ? ? PT End of Session - 06/28/21 1020   ? ? Visit Number 8   ? Number of Visits 16   ? Date for PT Re-Evaluation 08/05/21   ? Authorization Type Medicare   ? Progress Note Due on Visit 10   ? PT Start Time 1015   ? PT Stop Time 1058   ? PT Time Calculation (min) 43 min   ? Activity Tolerance Patient tolerated treatment well   ? Behavior During Therapy [facility] for tasks assessed/performed   ? ?  ?  ? ?  ? ? ?Past Medical History:  ?Diagnosis Date  ? Allergy   ? Arthritis   ? 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 Benign paroxysmal positional vertigo 07/12/2013  ? Cystocele   ? grade 2  ? Endometriosis   ? with Menometrorhaghia   ? Gastric polyps 09/30/2015  ? GERD (gastroesophageal reflux disease)   ? Hemorrhoids   ? History of colonic polyps 09/30/2015  ? adenoma  ? HTN (hypertension) 04/23/2013  ? 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 Osteopenia 10/09/2015  ? Sternal fracture   ? 1986  ? Vitamin D deficiency 04/06/2016  ? 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 ? ?Past Surgical History:  ?Procedure Laterality Date  ? APPENDECTOMY  1989  ? atypical nevus excision    ? BREAST BIOPSY Right   ? CHOLECYSTECTOMY  1989  ? COLONOSCOPY WITH PROPOFOL N/A 05/27/2015  ? Procedure: COLONOSCOPY WITH PROPOFOL;  Surgeon: Clarene Essex, MD;  Location: [facility];  Service: Endoscopy;  Laterality: N/A;  ? Diamond REMOVAL  1999  ? ESOPHAGOGASTRODUODENOSCOPY  2011  ? with esophageal stent, perforation   ? ESOPHAGOGASTRODUODENOSCOPY (EGD) WITH PROPOFOL N/A 05/27/2015  ? Procedure: ESOPHAGOGASTRODUODENOSCOPY (EGD) WITH PROPOFOL;  Surgeon: Clarene Essex, MD;  Location: [facility];  Service: Endoscopy;   Laterality: N/A;  ? HEMORRHOID SURGERY  2005  ? LEFT OOPHORECTOMY  2703  ? complicated by ureteral injuery requiring a ureteral stent   ? MMK / ANTERIOR VESICOURETHROPEXY / URETHROPEXY  2000  ? TONSILLECTOMY AND ADENOIDECTOMY    ? TOTAL ABDOMINAL HYSTERECTOMY  2000  ? with USO  ? TUBAL LIGATION  1981  ? wisdom teeth removal  1964  ? ? ?There were no vitals filed for this visit. ? ? Subjective Assessment - 06/28/21 1021   ? ? Subjective Pt reports no leakage, prolapse symptoms minimally felt with lifting items from low cabinets   ? ?  ?  ? ?  ? ? ? ? ? ? ? ? ? ? ? ? ? ? ? ? ? ? ? ? [facility] Adult PT Treatment/Exercise - 06/28/21 0001   ? ?  ? Exercises  ? Exercises Lumbar;Knee/Hip   ?  ? Lumbar Exercises: Aerobic  ? Elliptical 5 mins L3   ?  ? Lumbar Exercises: Standing  ? Functional Squats 20 reps   ? Functional Squats Limitations body weight with coordination of pelvic floor and breathing   ? Other Standing Lumbar Exercises trampoline" staggered stance forward bounding x10 each leg leading, x10 wide stance lateral bounding   ? Other Standing Lumbar Exercises wall push up x10 for TA activation; 5# kettle  bell and 2# DM lifts grom varied heights from ground and 4" step to mat table for coordination of breathing and pelvic floor/core support while lifting items from low levels and returning them to place without prolapse symptoms x10   ?  ? Lumbar Exercises: Seated  ? Sit to Stand 20 reps   ? Sit to Stand Limitations lightly sitting on mat   ?  ? Lumbar Exercises: Supine  ? Single Leg Bridge 10 reps   alt hip flexion with bridge  ? Other Supine Lumbar Exercises 90/90 heel taps 2x10 each with needing to have one leg on mat at a time   ?  ? Lumbar Exercises: Sidelying  ? Other Sidelying Lumbar Exercises ball press 2x10 each side with exhale for improved TA engagement and coordination   ? ?  ?  ? ?  ? ? ? ? ? ? ? ? ? ? PT Education - 06/28/21 1058   ? ? Education Details Pt educated on functional tasks at home with  cooridnation of pelvic floor and core and breathing to decrease prolapse symptoms with household tasks   ? Person(s) Educated Patient   ? Methods Explanation;Demonstration;Tactile cues;Verbal cues;Handout   ? Comprehension Returned demonstration;Verbalized understanding   ? ?  ?  ? ?  ? ? ? PT Short Term Goals - 05/08/21 1601   ? ?  ? PT SHORT TERM GOAL #1  ? Title pt to be I with HEP   ? Time 4   ? Period Weeks   ? Status New   ? Target Date 06/05/21   ?  ? PT SHORT TERM GOAL #2  ? Title pt to demonstrate at least 3/5 pelvic floor strength with ability to hold contraction for 10s for improved pelvic floor strength and endurance for lifting without prolapse symptoms felt.   ? Time 4   ? Period Weeks   ? Status New   ? Target Date 06/05/21   ?  ? PT SHORT TERM GOAL #3  ? Title pt to demonstrate improved coordination with pelvic floor contract/relax with breathing mechanics during functional tasks at least 50% of the timefor decrease strain at prolapse.   ? Time 4   ? Period Weeks   ? Status New   ? Target Date 06/05/21   ? ?  ?  ? ?  ? ? ? ? PT Long Term Goals - 05/08/21 1603   ? ?  ? PT LONG TERM GOAL #1  ? Title pt to be I with advanced HEP   ? Time 3   ? Status New   ? Target Date 08/05/21   ?  ? PT LONG TERM GOAL #2  ? Title pt to demonstrate at least 4/5 pelvic floor strength with ability to hold contraction for 10s for improved pelvic floor strength and endurance for lifting without prolapse symptoms felt.   ? Time 3   ? Period Months   ? Status New   ? Target Date 08/05/21   ?  ? PT LONG TERM GOAL #3  ? Title pt to demonstrate improved coordination with pelvic floor contract/relax with breathing mechanics during functional tasks at least 75% of the timefor decrease strain at prolapse.   ? Time 3   ? Period Months   ? Status New   ? Target Date 08/05/21   ?  ? PT LONG TERM GOAL #4  ? Title pt to demonstrate ability to lift 20# from ground level with proper lifting  mechanics and breathing mechanics for decreased  strain at pelvic floor   ? Time 3   ? Period Months   ? Status New   ? Target Date 08/05/21   ? ?  ?  ? ?  ? ? ? ? ? ? ? ? Plan - 06/28/21 1059   ? ? Clinical Impression Statement Pt presents to clinic with continued improvement and only noting prolapse with household tasks reporting she thinks she is not coordinating/activation pelvic floor for these and they are very mild. Pt session had progression of tasks for increased challenge with standing activities with pt reporting slight "awareness" of vaginal area but didn't feel "heaviness or bulge". Pt denied leakage throughout and reports she feels better about coordination to relate these to household tasks. Pt tolerated well. Pt would benefit from additional Pt to reassess pelvic floor and ensure she has improved with this as well as proper technique.   ? Personal Factors and Comorbidities Time since onset of injury/illness/exacerbation   ? Examination-Activity Limitations Continence;Lift;Squat;Carry   ? Examination-Participation Restrictions Community Activity;Shop;Yard Work;Cleaning   ? Stability/Clinical Decision Making Stable/Uncomplicated   ? Rehab Potential Good   ? PT Frequency 1x / week   ? PT Duration Other (comment)   10 visits  ? PT Treatment/Interventions ADLs/Self Care Home Management;Aquatic Therapy;Cryotherapy;Moist Heat;Functional mobility training;Stair training;Therapeutic activities;Therapeutic exercise;Neuromuscular re-education;Manual techniques;Patient/family education;Taping;Scar mobilization;Passive range of motion;Energy conservation;Joint Manipulations   ? PT Next Visit Plan breathing/core/PF coordination   ? PT Home Exercise Plan [id number]   ? Consulted and Agree with Plan of Care Patient   ? ?  ?  ? ?  ? ? ?Patient will benefit from skilled therapeutic intervention in order to improve the following deficits and impairments:  Decreased coordination, Decreased endurance, Increased fascial restricitons, Improper body mechanics, Impaired  flexibility, Decreased scar mobility, Decreased mobility, Decreased strength, Impaired sensation, Postural dysfunction ? ?Visit Diagnosis: ?Muscle weakness (generalized) ? ?Abnormal posture ? ?Unspecified lack of co

## 2021-07-05 ENCOUNTER — Encounter: Payer: Self-pay | Admitting: Physical Therapy

## 2021-07-06 ENCOUNTER — Ambulatory Visit: Payer: Medicare Other | Admitting: Physical Therapy

## 2021-07-06 DIAGNOSIS — R293 Abnormal posture: Secondary | ICD-10-CM | POA: Diagnosis not present

## 2021-07-06 DIAGNOSIS — R279 Unspecified lack of coordination: Secondary | ICD-10-CM

## 2021-07-06 DIAGNOSIS — M6281 Muscle weakness (generalized): Secondary | ICD-10-CM | POA: Diagnosis not present

## 2021-07-06 NOTE — Therapy (Addendum)
[facility] [place], [place], [address] Phone: [phone]   Fax:  [phone]  Physical Therapy Treatment  Patient Details  Name: Carrie Elliott MRN: [id number] Date of Birth: 1950/10/17 Referring Provider (PT): Megan Salon, MD   Encounter Date: 07/06/2021   PT End of Session - 07/06/21 1018     Visit Number 9    Number of Visits 16    Date for PT Re-Evaluation 08/05/21    Authorization Type Medicare    Progress Note Due on Visit 10    PT Start Time 1016    PT Stop Time 1058    PT Time Calculation (min) 42 min    Activity Tolerance Patient tolerated treatment well    Behavior During Therapy [facility] for tasks assessed/performed             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    Past Surgical History:  Procedure Laterality Date   APPENDECTOMY  1989   atypical nevus excision     BREAST BIOPSY Right    CHOLECYSTECTOMY  1989   COLONOSCOPY WITH PROPOFOL N/A 05/27/2015   Procedure: COLONOSCOPY WITH PROPOFOL;  Surgeon: Clarene Essex, MD;  Location: [facility];  Service: Endoscopy;  Laterality: N/A;   CYSTOSCOPY W/ URETERAL STENT REMOVAL  1999   ESOPHAGOGASTRODUODENOSCOPY  2011   with esophageal stent, perforation    ESOPHAGOGASTRODUODENOSCOPY (EGD) WITH PROPOFOL N/A 05/27/2015   Procedure: ESOPHAGOGASTRODUODENOSCOPY (EGD) WITH PROPOFOL;  Surgeon: Clarene Essex, MD;  Location: [facility];  Service: Endoscopy;   Laterality: N/A;   HEMORRHOID SURGERY  2005   LEFT OOPHORECTOMY  1165   complicated by ureteral injuery requiring a ureteral stent    MMK / ANTERIOR VESICOURETHROPEXY / URETHROPEXY  2000   TONSILLECTOMY AND ADENOIDECTOMY     TOTAL ABDOMINAL HYSTERECTOMY  2000   with USO   TUBAL LIGATION  1981   wisdom teeth removal  1964    There were no vitals filed for this visit.                   Pelvic Floor Special Questions - 07/06/21 0001     Prolapse Anterior Wall   grade 1 with strong cough with pelvic floor contraction; no contraction or with third strong cough grade 2 in hooklying   Pelvic Floor Internal Exam patient identified and patient confirms consent for PT to perform internal soft tissue work and muscle strength and integrity assessment    Exam Type Vaginal    Sensation WFL    Palpation no TTP    Strength strong squeeze, against strong resistance    Strength # of reps 8    Strength # of seconds 7               [facility] Adult PT Treatment/Exercise - 07/06/21 0001       Neuro Re-ed  Neuro Re-ed Details  internal vaginal treatment with pt consent: 2x10 pelvic floor contractions, 9J09 quick flicks, and T26 71I isometric holds. Pt benefited from [facility] for timing and increased speed of quick flicks for improved coordination and x5 reps of contraction with cough for improved coordination to decrease prolapse symptoms.      Lumbar Exercises: Standing   Other Standing Lumbar Exercises standng marching 6# DM bil 2x10      Lumbar Exercises: Seated   Sit to Stand 20 reps   6# DM bil     Lumbar Exercises: Sidelying   Other Sidelying Lumbar Exercises ball press 2x10 each side with exhale for improved TA engagement and coordination                       PT Short Term Goals - 07/06/21 1100       PT SHORT TERM GOAL #1   Title pt to be I with HEP    Time 4    Period Weeks    Status Achieved    Target Date 06/05/21      PT SHORT TERM GOAL #2   Title  pt to demonstrate at least 3/5 pelvic floor strength with ability to hold contraction for 10s for improved pelvic floor strength and endurance for lifting without prolapse symptoms felt.    Time 4    Period Weeks    Status Achieved    Target Date 06/05/21      PT SHORT TERM GOAL #3   Title pt to demonstrate improved coordination with pelvic floor contract/relax with breathing mechanics during functional tasks at least 50% of the timefor decrease strain at prolapse.    Time 4    Period Weeks    Status Achieved    Target Date 06/05/21               PT Long Term Goals - 07/06/21 1100       PT LONG TERM GOAL #1   Title pt to be I with advanced HEP    Time 3    Status Achieved    Target Date 08/05/21      PT LONG TERM GOAL #2   Title pt to demonstrate at least 4/5 pelvic floor strength with ability to hold contraction for 10s for improved pelvic floor strength and endurance for lifting without prolapse symptoms felt.    Time 3    Period Months    Status Achieved    Target Date 08/05/21      PT LONG TERM GOAL #3   Title pt to demonstrate improved coordination with pelvic floor contract/relax with breathing mechanics during functional tasks at least 75% of the timefor decrease strain at prolapse.    Time 3    Period Months    Status Partially Met    Target Date 08/05/21      PT LONG TERM GOAL #4   Title pt to demonstrate ability to lift 20# from ground level with proper lifting mechanics and breathing mechanics for decreased strain at pelvic floor   12#   Time 3    Period Months    Status Partially Met    Target Date 08/05/21                   Plan - 07/06/21 1104     Clinical Impression Statement Pt presents to clinic reporting she has not had any leakage, did have a few instances of prolapse symptoms with  lifting intermittently at home and has had a cough which she thinks has caused her to feel the prolapse some as well. Pt session focused on coordination of  pelvic floor and coughing to decrease strain at pelvic floor. Pt did consent to internal treatment this date and demonstrated improved with strength, endurance, coordination, and lesser descent of tissue toward vaginal opening with coughing and overall. Pt pleased with progress. Pt's session then focused on strengthening exercises with increased weight resistance and with minimal cues for pelvic floor engagement and breathing technique pt denied any prolapse symptoms. Pt reports she understands how to complete this pattern and when she takes her time she is able to do all tasks without prolapse symptoms but needs to remember to engage pelvic floor.    Personal Factors and Comorbidities Time since onset of injury/illness/exacerbation    Examination-Activity Limitations Continence;Lift;Squat;Carry    Examination-Participation Restrictions Community Activity;Shop;Yard Work;Cleaning    Stability/Clinical Decision Making Stable/Uncomplicated    Rehab Potential Good    PT Frequency 1x / week    PT Duration Other (comment)   10 visits   PT Treatment/Interventions ADLs/Self Care Home Management;Aquatic Therapy;Cryotherapy;Moist Heat;Functional mobility training;Stair training;Therapeutic activities;Therapeutic exercise;Neuromuscular re-education;Manual techniques;Patient/family education;Taping;Scar mobilization;Passive range of motion;Energy conservation;Joint Manipulations    PT Next Visit Plan breathing/core/PF coordination    PT Home Exercise Plan [id number]    [id number] and Agree with Plan of Care Patient             Patient will benefit from skilled therapeutic intervention in order to improve the following deficits and impairments:  Decreased coordination, Decreased endurance, Increased fascial restricitons, Improper body mechanics, Impaired flexibility, Decreased scar mobility, Decreased mobility, Decreased strength, Impaired sensation, Postural dysfunction  Visit Diagnosis: Muscle weakness  (generalized)  Unspecified lack of coordination  Abnormal posture     Problem List Patient Active Problem List   Diagnosis Date Noted   Left knee pain 01/22/2021   Recurrent falls 01/22/2021   Cystocele, unspecified (CODE) 10/16/2020   Insomnia 10/16/2020   Eyelid abnormality 10/16/2020   Diverticulosis 12/09/2019   Iron deficiency anemia 06/11/2019   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   Sleep apnea 09/04/2018   Vitamin B12 deficiency 02/18/2018   Bruit of right carotid artery 05/26/2017   Vertigo 11/23/2016   Vitamin D deficiency 04/06/2016   Osteopenia 10/09/2015   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 Patient is motivated to learn. Patient understands and agrees with treatment goals and plan. PT explains patient will be examined in standing, sitting, and lying down to see how their muscles and joints work. When they are ready, they will be asked to remove their underwear so PT can examine their perineum. The patient is also given the option of providing their own chaperone as one is not provided in our facility. The patient also has the right and is explained the right to defer or refuse any part of the evaluation or treatment including the internal exam. With the patient's consent, PT will use one gloved finger to gently assess the muscles  of the pelvic floor, seeing how well it contracts and relaxes and if there is muscle symmetry. After, the patient will get dressed and PT and patient will discuss exam  findings and plan of care. PT and patient discuss plan of care, schedule, attendance policy and HEP activities.   Stacy Gardner, PT, DPT 07/06/2309:50 AM   PHYSICAL THERAPY DISCHARGE SUMMARY  Visits from Start of Care: 9  Current functional level related to goals / functional outcomes: Pt reporting improvement per session notes   Remaining deficits: Pt met all STG and fully met 1/3 LTG but partially met other 2 LTG   Education / Equipment: HEP   Patient agrees to discharge. Patient goals were partially met. Patient is being discharged due to being pleased with the current functional level.  Stacy Gardner, PT, DPT 05/23/233:28 PM    [facility] [place], [place], [address] Phone: [phone]   Fax:  [phone]  Name: Carrie Elliott MRN: [id number] Date of Birth: 02-Oct-1950

## 2021-07-07 ENCOUNTER — Ambulatory Visit: Payer: Medicare Other | Admitting: Obstetrics and Gynecology

## 2021-07-13 ENCOUNTER — Ambulatory Visit (INDEPENDENT_AMBULATORY_CARE_PROVIDER_SITE_OTHER): Payer: Medicare Other | Admitting: Obstetrics and Gynecology

## 2021-07-13 VITALS — BP 136/68 | HR 77

## 2021-07-13 DIAGNOSIS — N393 Stress incontinence (female) (male): Secondary | ICD-10-CM | POA: Diagnosis not present

## 2021-07-13 DIAGNOSIS — N8110 Cystocele, unspecified: Secondary | ICD-10-CM

## 2021-07-13 NOTE — Progress Notes (Signed)
[facility] ?Return Visit ? ?SUBJECTIVE  ?History of Present Illness: ?Carrie Elliott is a 71 y.o. female seen in follow-up for POP and mixed incontinence. Plan at last visit was to start pelvic PT. ? ?She has been attending pelvic PT. Reports that she has seen improvement in her symptoms, but not improved as much as she hoped.  She is continuing to do the exercises.  ? ? ? ?Past Medical History: ?Patient  has a past medical history of Allergy, Arthritis, Benign paroxysmal positional vertigo (07/12/2013), Cystocele, Endometriosis, Gastric polyps (09/30/2015), GERD (gastroesophageal reflux disease), Hemorrhoids, History of colonic polyps (09/30/2015), HTN (hypertension) (04/23/2013), Iron malabsorption (06/11/2019), Osteopenia (10/09/2015), Sternal fracture, Vitamin D deficiency (04/06/2016), and Vocal cord polyp (09/30/2015).  ? ?Past Surgical History: ?She  has a past surgical history that includes Appendectomy (1989); Cholecystectomy (1989); atypical nevus excision; Hemorrhoid surgery (2005); Left oophorectomy (1999); wisdom teeth removal (1964); Total abdominal hysterectomy (2000); Tubal ligation (1981); Esophagogastroduodenoscopy (2011); MMK / anterior vesicourethropexy / urethropexy (2000); Cystoscopy w/ ureteral stent removal (1999); Tonsillectomy and adenoidectomy; Esophagogastroduodenoscopy (egd) with propofol (N/A, 05/27/2015); Colonoscopy with propofol (N/A, 05/27/2015); and Breast biopsy (Right).  ? ?Medications: ?She has a current medication list which includes the following prescription(s): benzocaine, vitamin d3, esomeprazole, esomeprazole, famotidine, fluticasone, hydrochlorothiazide, lidocaine-hydrocort (perianal), lidocaine-hydrocortisone ace, lisinopril, metoprolol succinate, ondansetron, saline, and famotidine.  ? ?Allergies: ?Patient is allergic to pseudoephedrine hcl er, singulair [montelukast sodium], cefdinir, celebrex [celecoxib], ciprofloxacin, and codeine.  ? ?Social  History: ?Patient  reports that she has never smoked. She has never used smokeless tobacco. She reports that she does not drink alcohol and does not use drugs.  ?  ?  ?OBJECTIVE  ?  ? ?Physical Exam: ?Vitals:  ? 07/13/21 1602  ?BP: 136/68  ?Pulse: 77  ? ?Gen: No apparent distress, A&O x 3. ? ?Detailed Urogynecologic Evaluation:  ?Deferred. Prior exam showed: ? ?POP-Q:  ?  ?POP-Q ?  ?0  ?                                          Aa   ?0 ?                                          Ba   ?-7  ?                                            C  ?  ?2  ?                                          Gh   ?3  ?                                          Pb   ?8  ?  tvl  ?  ?-2  ?                                          Ap   ?-2  ?                                          Bp   ?   ?                                            D  ?   ? ?ASSESSMENT AND PLAN  ?  ?Carrie Elliott is a 71 y.o. with:  ?1. Prolapse of anterior vaginal wall   ?2. SUI (stress urinary incontinence, female)   ? ?- We reviewed options of pessary vs surgery (anterior repair with possible SSLF).  ?- She is considering her options. If she decided she wants surgery, she will need urodynamic testing due to history of MMK procedure. May be a candidate for an additional anti-incontinence procedure.  ? ?She will notify us if she decides to proceed ? ?Jaquita Folds, MD ? ?Time spent: I spent 20 minutes dedicated to the care of this patient on the date of this encounter to include pre-visit review of records, face-to-face time with the patient and post visit documentation ? ?

## 2021-07-14 ENCOUNTER — Other Ambulatory Visit (HOSPITAL_BASED_OUTPATIENT_CLINIC_OR_DEPARTMENT_OTHER): Payer: Self-pay

## 2021-07-15 ENCOUNTER — Encounter: Payer: Self-pay | Admitting: Obstetrics and Gynecology

## 2021-08-09 ENCOUNTER — Other Ambulatory Visit (HOSPITAL_BASED_OUTPATIENT_CLINIC_OR_DEPARTMENT_OTHER): Payer: Self-pay

## 2021-09-20 ENCOUNTER — Other Ambulatory Visit: Payer: Self-pay | Admitting: Family Medicine

## 2021-09-20 ENCOUNTER — Other Ambulatory Visit (HOSPITAL_BASED_OUTPATIENT_CLINIC_OR_DEPARTMENT_OTHER): Payer: Self-pay

## 2021-09-20 MED ORDER — HYDROCHLOROTHIAZIDE 12.5 MG PO CAPS
ORAL_CAPSULE | ORAL | 2 refills | Status: DC
  Filled 2021-09-20: qty 90, 90d supply, fill #0
  Filled 2022-03-21: qty 90, 90d supply, fill #1
  Filled 2022-08-06: qty 90, 90d supply, fill #2

## 2021-09-28 ENCOUNTER — Other Ambulatory Visit (HOSPITAL_BASED_OUTPATIENT_CLINIC_OR_DEPARTMENT_OTHER): Payer: Self-pay

## 2021-10-05 ENCOUNTER — Other Ambulatory Visit (INDEPENDENT_AMBULATORY_CARE_PROVIDER_SITE_OTHER): Payer: Medicare Other

## 2021-10-05 DIAGNOSIS — I10 Essential (primary) hypertension: Secondary | ICD-10-CM | POA: Diagnosis not present

## 2021-10-05 DIAGNOSIS — E785 Hyperlipidemia, unspecified: Secondary | ICD-10-CM | POA: Diagnosis not present

## 2021-10-05 DIAGNOSIS — R739 Hyperglycemia, unspecified: Secondary | ICD-10-CM

## 2021-10-05 DIAGNOSIS — E538 Deficiency of other specified B group vitamins: Secondary | ICD-10-CM | POA: Diagnosis not present

## 2021-10-05 DIAGNOSIS — E559 Vitamin D deficiency, unspecified: Secondary | ICD-10-CM | POA: Diagnosis not present

## 2021-10-05 LAB — TSH: TSH: 1.46 u[IU]/mL (ref 0.35–5.50)

## 2021-10-05 LAB — COMPREHENSIVE METABOLIC PANEL
ALT: 16 U/L (ref 0–35)
AST: 17 U/L (ref 0–37)
Albumin: 4.3 g/dL (ref 3.5–5.2)
Alkaline Phosphatase: 48 U/L (ref 39–117)
BUN: 16 mg/dL (ref 6–23)
CO2: 28 mEq/L (ref 19–32)
Calcium: 9.6 mg/dL (ref 8.4–10.5)
Chloride: 101 mEq/L (ref 96–112)
Creatinine, Ser: 0.93 mg/dL (ref 0.40–1.20)
GFR: 62.04 mL/min (ref >60.00)
Glucose, Bld: 89 mg/dL (ref 70–99)
Potassium: 4.5 mEq/L (ref 3.5–5.1)
Sodium: 135 mEq/L (ref 135–145)
Total Bilirubin: 0.4 mg/dL (ref 0.2–1.2)
Total Protein: 7 g/dL (ref 6.0–8.3)

## 2021-10-05 LAB — LIPID PANEL
Cholesterol: 205 mg/dL — ABNORMAL HIGH (ref 0–200)
HDL: 54.2 mg/dL (ref >39.00)
NonHDL: 150.44
Total CHOL/HDL Ratio: 4
Triglycerides: 208 mg/dL — ABNORMAL HIGH (ref 0.0–149.0)
VLDL: 41.6 mg/dL — ABNORMAL HIGH (ref 0.0–40.0)

## 2021-10-05 LAB — LDL CHOLESTEROL, DIRECT: Direct LDL: 120 mg/dL

## 2021-10-05 LAB — CBC
HCT: 37.3 % (ref 36.0–46.0)
Hemoglobin: 12.4 g/dL (ref 12.0–15.0)
MCHC: 33.2 g/dL (ref 30.0–36.0)
MCV: 91.3 fl (ref 78.0–100.0)
Platelets: 234 10*3/uL (ref 150.0–400.0)
RBC: 4.08 Mil/uL (ref 3.87–5.11)
RDW: 13 % (ref 11.5–15.5)
WBC: 5.9 10*3/uL (ref 4.0–10.5)

## 2021-10-05 LAB — VITAMIN D 25 HYDROXY (VIT D DEFICIENCY, FRACTURES): VITD: 41.49 ng/mL (ref 30.00–100.00)

## 2021-10-05 LAB — HEMOGLOBIN A1C: Hgb A1c MFr Bld: 5.8 % (ref 4.6–6.5)

## 2021-10-05 LAB — VITAMIN B12: Vitamin B-12: 507 pg/mL (ref 211–911)

## 2021-10-11 NOTE — Progress Notes (Deleted)
Subjective:    Patient ID: Carrie Elliott, female    DOB: [birth year], 71 y.o.   MRN: [id number]  No chief complaint on file.   HPI Patient is in today for a follow up.  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    Past Surgical History:  Procedure Laterality Date   APPENDECTOMY  1989   atypical nevus excision     BREAST BIOPSY Right    CHOLECYSTECTOMY  1989   COLONOSCOPY WITH PROPOFOL N/A 05/27/2015   Procedure: COLONOSCOPY WITH PROPOFOL;  Surgeon: Clarene Essex, MD;  Location: [facility];  Service: Endoscopy;  Laterality: N/A;   CYSTOSCOPY W/ URETERAL STENT REMOVAL  1999   ESOPHAGOGASTRODUODENOSCOPY  2011   with esophageal stent, perforation    ESOPHAGOGASTRODUODENOSCOPY (EGD) WITH PROPOFOL N/A 05/27/2015   Procedure: ESOPHAGOGASTRODUODENOSCOPY (EGD) WITH PROPOFOL;  Surgeon: Clarene Essex, MD;  Location: [facility];  Service: Endoscopy;  Laterality: N/A;   HEMORRHOID SURGERY  2005   LEFT OOPHORECTOMY  9528   complicated by ureteral injuery requiring a ureteral stent    MMK / ANTERIOR VESICOURETHROPEXY / URETHROPEXY  2000   TONSILLECTOMY AND ADENOIDECTOMY     TOTAL ABDOMINAL HYSTERECTOMY  2000   with USO   TUBAL LIGATION  1981   wisdom teeth removal  1964    Family History  Problem Relation Age of Onset   Colon polyps Father    Hypertension Father    Heart failure Father    Melanoma Father        multiple   Heart attack Father        x2   Heart disease Father        Bundle  Block   Meniere's disease Father    Cancer Father        melanoma   Osteoporosis Mother    Transient ischemic attack Mother    Cancer Mother        colon   Heart disease Mother        afib   Hyperlipidemia Mother    Diabetes Paternal Grandmother    Stroke Maternal Grandmother    Cancer Maternal Grandmother        skin, melanoma, sarcoma   Colon polyps Maternal Grandmother    Diverticulitis Maternal Grandmother    Stroke Maternal Grandfather    Heart disease Maternal Grandfather    Obesity Brother     Social History   Socioeconomic History   Marital status: Married    Spouse name: Not on file   Number of children: Not on file   Years of education: Not on file   Highest education level: Not on file  Occupational History   Not on file  Tobacco Use   Smoking status: Never   Smokeless tobacco: Never  Vaping Use   Vaping Use: Never used  Substance and Sexual Activity   Alcohol use: No   Drug use: No  Sexual activity: Yes    Birth control/protection: Surgical    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, avoids spicy  Other Topics Concern   Not on file  Social History Narrative   Not on file   Social Determinants of Health   Financial Resource Strain: Low Risk  (02/21/2021)   Overall Financial Resource Strain (CARDIA)    Difficulty of Paying Living Expenses: Not hard at all  Food Insecurity: No Food Insecurity (02/21/2021)   Hunger Vital Sign    Worried About Running Out of Food in the Last Year: Never true    [place] in the Last Year: Never true  Transportation Needs: No Transportation Needs (02/21/2021)   PRAPARE - Hydrologist (Medical): No    Lack of Transportation (Non-Medical): No  Physical Activity: Inactive (02/21/2021)   Exercise Vital Sign    Days of Exercise per Week: 0 days    Minutes of Exercise per Session: 0 min  Stress: No Stress Concern Present (02/21/2021)   [place]    Feeling of Stress : Not at all  Social Connections: [place] (02/21/2021)   Social Connection and Isolation Panel [NHANES]    Frequency of Communication with Friends and Family: More than three times a week    Frequency of Social Gatherings with Friends and Family: More than three times a week    Attends Religious Services: More than 4 times per year    Active Member of [employer] or Organizations: Yes    Attends Music therapist: More than 4 times per year    Marital Status: Married  Human resources officer Violence: Not At Risk (02/21/2021)   Humiliation, Afraid, Rape, and Kick questionnaire    Fear of Current or Ex-Partner: No    Emotionally Abused: No    Physically Abused: No    Sexually Abused: No    Outpatient Medications Prior to Visit  Medication Sig Dispense Refill   benzocaine (ORAJEL) 10 % mucosal gel Use as directed 1 application in the mouth or throat as needed for mouth pain. Reported on 07/22/2015     Cholecalciferol (VITAMIN D3) 50 MCG (2000 UT) TABS Take by mouth. Take 1 tablet daily Monday-Friday. Take 2 tablets on Saturday.     esomeprazole (NEXIUM) 40 MG capsule TAKE 1 CAPSULE BY MOUTH ONCE A DAY 90 capsule 3   esomeprazole (NEXIUM) 40 MG capsule Take 1 capsule by mouth once a day 90 capsule 4   famotidine (PEPCID) 20 MG tablet TAKE 1 TABLET BY MOUTH AT BEDTIME AS NEEDED TWICE A DAY 60 tablet 3   famotidine (PEPCID) 20 MG tablet Take 1 tablet by mouth twice a day as needed 180 tablet 2   fluticasone (FLONASE) 50 MCG/ACT nasal spray Place 2 sprays into both nostrils daily. 16 g 6   hydrochlorothiazide (MICROZIDE) 12.5 MG capsule TAKE 1 CAPSULE (12.5 MG TOTAL) BY MOUTH DAILY. TAKE IF > 110/70 90 capsule 1   hydrochlorothiazide (MICROZIDE) 12.5 MG capsule TAKE 1 CAPSULE (12.5 MG TOTAL) BY MOUTH DAILY. TAKE IF > 110/70 90 capsule 2   Lidocaine-Hydrocort, Perianal, 3-0.5 % CREA Apply rectally as directed once a day  if needed 98 g 2   Lidocaine-Hydrocortisone Ace 3-0.5 % CREA Apply 1 application topically daily as needed (hemmoriods.).      lisinopril (ZESTRIL) 10 MG tablet TAKE 1 TABLET (10 MG TOTAL) BY MOUTH 2 (TWO) TIMES DAILY. 180 tablet 1  metoprolol succinate (TOPROL-XL) 50 MG 24 hr tablet TAKE 1 - 2 TABLETS BY MOUTH TWO TIMES DAILY WITH OR IMMEDIATELY FOLLOWING A MEAL 360 tablet 1   ondansetron (ZOFRAN) 8 MG tablet Take 1 tablet (8 mg total) by mouth every 8 (eight) hours as needed for nausea or vomiting. 90 tablet 1   SIMPLY SALINE NA Place 1 each into the nose daily.      No facility-administered medications prior to visit.    Allergies  Allergen Reactions   Pseudoephedrine Hcl Er Other (See Comments)    TACHYCARDIA    Singulair [Montelukast Sodium] Other (See Comments)    Mental status changes, irritable   Cefdinir Hives   Celebrex [Celecoxib] Other (See Comments)    ABDOMINAL PAIN    Ciprofloxacin Other (See Comments)    ABDOMINAL PAIN    Codeine Nausea And Vomiting    NAUSEA VOMITING - can take with zofran    ROS     Objective:    Physical Exam  There were no vitals taken for this visit. Wt Readings from Last 3 Encounters:  06/08/21 171 lb 12.8 oz (77.9 kg)  04/07/21 167 lb (75.8 kg)  02/23/21 167 lb 6.4 oz (75.9 kg)    Diabetic Foot Exam - Simple   No data filed    Lab Results  Component Value Date   WBC 5.9 10/05/2021   HGB 12.4 10/05/2021   HCT 37.3 10/05/2021   PLT 234.0 10/05/2021   GLUCOSE 89 10/05/2021   CHOL 205 (H) 10/05/2021   TRIG 208.0 (H) 10/05/2021   HDL 54.20 10/05/2021   LDLDIRECT 120.0 10/05/2021   LDLCALC 119 (H) 06/01/2021   ALT 16 10/05/2021   AST 17 10/05/2021   NA 135 10/05/2021   K 4.5 10/05/2021   CL 101 10/05/2021   CREATININE 0.93 10/05/2021   BUN 16 10/05/2021   CO2 28 10/05/2021   TSH 1.46 10/05/2021   INR 1.19 02/07/2010   HGBA1C 5.8 10/05/2021    Lab Results  Component Value Date   TSH 1.46 10/05/2021   Lab Results   Component Value Date   WBC 5.9 10/05/2021   HGB 12.4 10/05/2021   HCT 37.3 10/05/2021   MCV 91.3 10/05/2021   PLT 234.0 10/05/2021   Lab Results  Component Value Date   NA 135 10/05/2021   K 4.5 10/05/2021   CO2 28 10/05/2021   GLUCOSE 89 10/05/2021   BUN 16 10/05/2021   CREATININE 0.93 10/05/2021   BILITOT 0.4 10/05/2021   ALKPHOS 48 10/05/2021   AST 17 10/05/2021   ALT 16 10/05/2021   PROT 7.0 10/05/2021   ALBUMIN 4.3 10/05/2021   CALCIUM 9.6 10/05/2021   ANIONGAP 8 10/13/2019   GFR 62.04 10/05/2021   Lab Results  Component Value Date   CHOL 205 (H) 10/05/2021   Lab Results  Component Value Date   HDL 54.20 10/05/2021   Lab Results  Component Value Date   LDLCALC 119 (H) 06/01/2021   Lab Results  Component Value Date   TRIG 208.0 (H) 10/05/2021   Lab Results  Component Value Date   CHOLHDL 4 10/05/2021   Lab Results  Component Value Date   HGBA1C 5.8 10/05/2021       Assessment & Plan:      Problem List Items Addressed This Visit   None   I am having Christean Grief. Laramee maintain her benzocaine, Lidocaine-Hydrocortisone Ace, SIMPLY SALINE NA, Vitamin D3, famotidine, esomeprazole, hydrochlorothiazide, ondansetron, metoprolol succinate, esomeprazole, famotidine,  Lidocaine-Hydrocort (Perianal), lisinopril, fluticasone, and hydrochlorothiazide.  No orders of the defined types were placed in this encounter.

## 2021-10-12 ENCOUNTER — Encounter: Payer: Self-pay | Admitting: Family Medicine

## 2021-10-12 ENCOUNTER — Ambulatory Visit (INDEPENDENT_AMBULATORY_CARE_PROVIDER_SITE_OTHER): Payer: Medicare Other | Admitting: Family Medicine

## 2021-10-12 VITALS — BP 116/74 | HR 77 | Resp 20 | Ht 61.5 in | Wt 175.0 lb

## 2021-10-12 DIAGNOSIS — E785 Hyperlipidemia, unspecified: Secondary | ICD-10-CM

## 2021-10-12 DIAGNOSIS — K219 Gastro-esophageal reflux disease without esophagitis: Secondary | ICD-10-CM

## 2021-10-12 DIAGNOSIS — K449 Diaphragmatic hernia without obstruction or gangrene: Secondary | ICD-10-CM

## 2021-10-12 DIAGNOSIS — E538 Deficiency of other specified B group vitamins: Secondary | ICD-10-CM

## 2021-10-12 DIAGNOSIS — Z1231 Encounter for screening mammogram for malignant neoplasm of breast: Secondary | ICD-10-CM | POA: Diagnosis not present

## 2021-10-12 DIAGNOSIS — I10 Essential (primary) hypertension: Secondary | ICD-10-CM

## 2021-10-12 DIAGNOSIS — R739 Hyperglycemia, unspecified: Secondary | ICD-10-CM | POA: Diagnosis not present

## 2021-10-12 DIAGNOSIS — R350 Frequency of micturition: Secondary | ICD-10-CM

## 2021-10-12 DIAGNOSIS — E559 Vitamin D deficiency, unspecified: Secondary | ICD-10-CM | POA: Diagnosis not present

## 2021-10-12 DIAGNOSIS — D500 Iron deficiency anemia secondary to blood loss (chronic): Secondary | ICD-10-CM

## 2021-10-12 DIAGNOSIS — Z1239 Encounter for other screening for malignant neoplasm of breast: Secondary | ICD-10-CM | POA: Diagnosis not present

## 2021-10-12 NOTE — Patient Instructions (Addendum)
Yerba Matte tea do not drink  PURE diet    Allergies, Adult An allergy is a condition in which the body's defense system (immune system) comes in contact with an allergen and reacts to it. An allergen is anything that causes an allergic reaction. Allergens cause the immune system to make proteins for fighting infections (antibodies). These antibodies cause cells to release chemicals called histamines that set off the symptoms of an allergic reaction. Allergies often affect the nasal passages (allergic rhinitis), eyes (allergic conjunctivitis), skin (atopic dermatitis), and stomach. Allergies can be mild, moderate, or severe. They cannot spread from person to person. Allergies can develop at any age and may be outgrown. What are the causes? This condition is caused by allergens. Common allergens include: Outdoor allergens, such as pollen, car fumes, and mold. Indoor allergens, such as dust, smoke, mold, and pet dander. Other allergens, such as foods, medicines, scents, insect bites or stings, and other skin irritants. What increases the risk? You are more likely to develop this condition if you have: Family members with allergies. Family members who have any condition that may be caused by allergens, such as asthma. This may make you more likely to have other allergies. What are the signs or symptoms? Symptoms of this condition depend on the severity of the allergy. Mild to moderate symptoms Runny nose, stuffy nose (nasal congestion), or sneezing. Itchy mouth, ears, or throat. A feeling of mucus dripping down the back of your throat (postnasal drip). Sore throat. Itchy, red, watery, or puffy eyes. Skin rash, or itchy, red, swollen areas of skin (hives). Stomach cramps or bloating. Severe symptoms Severe allergies to food, medicine, or insect bites may cause anaphylaxis, which can be life-threatening. Symptoms include: A red (flushed) face. Wheezing or coughing. Swollen lips, tongue, or  mouth. Tight or swollen throat. Chest pain or tightness, or rapid heartbeat. Trouble breathing or shortness of breath. Pain in the abdomen, vomiting, or diarrhea. Dizziness or fainting. How is this diagnosed? This condition is diagnosed based on your symptoms, your family and medical history, and a physical exam. You may also have tests, including: Skin tests to see how your skin reacts to allergens that may be causing your symptoms. Tests include: Skin prick test. For this test, an allergen is introduced to your body through a small opening in the skin. Intradermal skin test. For this test, a small amount of allergen is injected under the first layer of your skin. Patch test. For this test, a small amount of allergen is placed on your skin. The area is covered and then checked after a few days. Blood tests. A challenge test. For this test, you will eat or breathe in a small amount of allergen to see if you have an allergic reaction. You may also be asked to: Keep a food diary. This is a record of all the foods, drinks, and symptoms you have in a day. Try an elimination diet. To do this: Remove certain foods from your diet. Add those foods back one by one to find out if any foods cause an allergic reaction. How is this treated?     Treatment for allergies depends on your symptoms. Treatment may include: Cold, wet cloths (cold compresses) to soothe itching and swelling. Eye drops or nasal sprays. Nasal irrigation to help clear your mucus or keep the nasal passages moist. A humidifier to add moisture to the air. Skin creams to treat rashes or itching. Oral antihistamines or other medicines to block the reaction or  to treat inflammation. Diet changes to remove foods that cause allergies. Being exposed again and again to tiny amounts of allergens to help you build a defense against it (tolerance). This is called immunotherapy. Examples include: Allergy shot. You receive an injection that  contains an allergen. Sublingual immunotherapy. You take a small dose of allergen under your tongue. Emergency injection for anaphylaxis. You give yourself a shot using a syringe (auto-injector) that contains the amount of medicine you need. Your health care provider will teach you how to give yourself an injection. Follow these instructions at home: Medicines  Take or apply over-the-counter and prescription medicines only as told by your health care provider. Always carry your auto-injector pen if you are at risk of anaphylaxis. Give yourself an injection as told by your health care provider. Eating and drinking Follow instructions from your health care provider about eating or drinking restrictions. Drink enough fluid to keep your urine pale yellow. General instructions Wear a medical alert bracelet or necklace to let others know that you have had anaphylaxis before. Avoid known allergens whenever possible. Keep all follow-up visits as told by your health care provider. This is important. Contact a health care provider if: Your symptoms do not get better with treatment. Get help right away if: You have symptoms of anaphylaxis. These include: Swollen mouth, tongue, or throat. Pain or tightness in your chest. Trouble breathing or shortness of breath. Dizziness or fainting. Severe abdominal pain, vomiting, or diarrhea. These symptoms may represent a serious problem that is an emergency. Do not wait to see if the symptoms will go away. Get medical help right away. Call your local emergency services (911 in the [place] Do not drive yourself to the hospital. Summary Take or apply over-the-counter and prescription medicines only as told by your health care provider. Avoid known allergens when possible. Always carry your auto-injector pen if you are at risk of anaphylaxis. Give yourself an injection as told by your health care provider. Wear a medical alert bracelet or necklace to let others  know that you have had anaphylaxis before. Anaphylaxis is a life-threatening emergency. Get help right away. This information is not intended to replace advice given to you by your health care provider. Make sure you discuss any questions you have with your health care provider. Document Revised: 11/09/2019 Document Reviewed: 01/21/2019 Elsevier Patient Education  [place].

## 2021-10-12 NOTE — Progress Notes (Signed)
Subjective:   By signing my name below, I, Kellie Simmering, attest that this documentation has been prepared under the direction and in the presence of Mosie Lukes, MD 10/12/2021.   Patient ID: Carrie Elliott, female    DOB: 1950-08-16, 71 y.o.   MRN: [id number]  Chief Complaint  Patient presents with   Follow-up    HPI Patient is in today for an office visit.  Joint pain: She reports that she is experiencing join point but is able to walk normally.  Diet: She reports that her diet is relatively balanced but she can improve it. She has been informed about incorporating a PURE diet.  Cholesterol: Her cholesterol levels have been relatively well maintained.  Lab Results  Component Value Date   CHOL 205 (H) 10/05/2021   CHOL 211 (H) 06/01/2021   CHOL 214 (H) 01/12/2021   Lab Results  Component Value Date   TRIG 208.0 (H) 10/05/2021   TRIG 160.0 (H) 06/01/2021   TRIG 159.0 (H) 01/12/2021   Immunizations: She is UTD on her pneumonia and tetanus immunizations. She has been informed about receiving RSV, COVID-19 and flu  immunizations in the fall.   Stomach polyp: She has an appointment scheduled in the fall with Dr. Watt Climes to follow up with the polyp of her stomach.    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    Past Surgical History:  Procedure Laterality Date   APPENDECTOMY  1989   atypical nevus excision     BREAST BIOPSY Right    CHOLECYSTECTOMY  1989   COLONOSCOPY WITH  PROPOFOL N/A 05/27/2015   Procedure: COLONOSCOPY WITH PROPOFOL;  Surgeon: Clarene Essex, MD;  Location: [facility];  Service: Endoscopy;  Laterality: N/A;   CYSTOSCOPY W/ URETERAL STENT REMOVAL  1999   ESOPHAGOGASTRODUODENOSCOPY  2011   with esophageal stent, perforation    ESOPHAGOGASTRODUODENOSCOPY (EGD) WITH PROPOFOL N/A 05/27/2015   Procedure: ESOPHAGOGASTRODUODENOSCOPY (EGD) WITH PROPOFOL;  Surgeon: Clarene Essex, MD;  Location: [facility];  Service: Endoscopy;  Laterality: N/A;   HEMORRHOID SURGERY  2005   LEFT OOPHORECTOMY  3300   complicated by ureteral injuery requiring a ureteral stent    MMK / ANTERIOR VESICOURETHROPEXY / URETHROPEXY  2000   TONSILLECTOMY AND ADENOIDECTOMY     TOTAL ABDOMINAL HYSTERECTOMY  2000   with USO   TUBAL LIGATION  1981   wisdom teeth removal  1964    Family History  Problem Relation Age of Onset   Colon polyps Father    Hypertension Father    Heart failure Father    Melanoma Father        multiple   Heart attack Father        x2   Heart disease Father        Bundle Block   Meniere's disease Father    Cancer Father        melanoma   Osteoporosis Mother  Transient ischemic attack Mother    Cancer Mother        colon   Heart disease Mother        afib   Hyperlipidemia Mother    Diabetes Paternal Grandmother    Stroke Maternal Grandmother    Cancer Maternal Grandmother        skin, melanoma, sarcoma   Colon polyps Maternal Grandmother    Diverticulitis Maternal Grandmother    Stroke Maternal Grandfather    Heart disease Maternal Grandfather    Obesity Brother     Social History   Socioeconomic History   Marital status: Married    Spouse name: Not on file   Number of children: Not on file   Years of education: Not on file   Highest education level: Not on file  Occupational History   Not on file  Tobacco Use   Smoking status: Never   Smokeless tobacco: Never  Vaping Use   Vaping Use: Never used  Substance and Sexual  Activity   Alcohol use: No   Drug use: No   Sexual activity: Yes    Birth control/protection: Surgical    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, avoids spicy  Other Topics Concern   Not on file  Social History Narrative   Not on file   Social Determinants of Health   Financial Resource Strain: Low Risk  (02/21/2021)   Overall Financial Resource Strain (CARDIA)    Difficulty of Paying Living Expenses: Not hard at all  Food Insecurity: No Food Insecurity (02/21/2021)   Hunger Vital Sign    Worried About Running Out of Food in the Last Year: Never true    [place] in the Last Year: Never true  Transportation Needs: No Transportation Needs (02/21/2021)   PRAPARE - Hydrologist (Medical): No    Lack of Transportation (Non-Medical): No  Physical Activity: Inactive (02/21/2021)   Exercise Vital Sign    Days of Exercise per Week: 0 days    Minutes of Exercise per Session: 0 min  Stress: No Stress Concern Present (02/21/2021)   [place]    Feeling of Stress : Not at all  Social Connections: [place] (02/21/2021)   Social Connection and Isolation Panel [NHANES]    Frequency of Communication with Friends and Family: More than three times a week    Frequency of Social Gatherings with Friends and Family: More than three times a week    Attends Religious Services: More than 4 times per year    Active Member of [employer] or Organizations: Yes    Attends Music therapist: More than 4 times per year    Marital Status: Married  Human resources officer Violence: Not At Risk (02/21/2021)   Humiliation, Afraid, Rape, and Kick questionnaire    Fear of Current or Ex-Partner: No    Emotionally Abused: No    Physically Abused: No    Sexually Abused: No    Outpatient Medications Prior to Visit  Medication Sig Dispense Refill   benzocaine (ORAJEL) 10 % mucosal gel  Use as directed 1 application in the mouth or throat as needed for mouth pain. Reported on 07/22/2015     Cholecalciferol (VITAMIN D3) 50 MCG (2000 UT) TABS Take by mouth. Take 1 tablet daily Monday-Friday. Take 2 tablets on Saturday.     esomeprazole (NEXIUM) 40 MG capsule TAKE 1 CAPSULE BY MOUTH ONCE A DAY  90 capsule 3   esomeprazole (NEXIUM) 40 MG capsule Take 1 capsule by mouth once a day 90 capsule 4   famotidine (PEPCID) 20 MG tablet Take 1 tablet by mouth twice a day as needed 180 tablet 2   fluticasone (FLONASE) 50 MCG/ACT nasal spray Place 2 sprays into both nostrils daily. 16 g 6   hydrochlorothiazide (MICROZIDE) 12.5 MG capsule TAKE 1 CAPSULE (12.5 MG TOTAL) BY MOUTH DAILY. TAKE IF > 110/70 90 capsule 1   hydrochlorothiazide (MICROZIDE) 12.5 MG capsule TAKE 1 CAPSULE (12.5 MG TOTAL) BY MOUTH DAILY. TAKE IF > 110/70 90 capsule 2   Lidocaine-Hydrocort, Perianal, 3-0.5 % CREA Apply rectally as directed once a day if needed 98 g 2   Lidocaine-Hydrocortisone Ace 3-0.5 % CREA Apply 1 application topically daily as needed (hemmoriods.).      lisinopril (ZESTRIL) 10 MG tablet TAKE 1 TABLET (10 MG TOTAL) BY MOUTH 2 (TWO) TIMES DAILY. 180 tablet 1   metoprolol succinate (TOPROL-XL) 50 MG 24 hr tablet TAKE 1 - 2 TABLETS BY MOUTH TWO TIMES DAILY WITH OR IMMEDIATELY FOLLOWING A MEAL 360 tablet 1   ondansetron (ZOFRAN) 8 MG tablet Take 1 tablet (8 mg total) by mouth every 8 (eight) hours as needed for nausea or vomiting. 90 tablet 1   SIMPLY SALINE NA Place 1 each into the nose daily.      famotidine (PEPCID) 20 MG tablet TAKE 1 TABLET BY MOUTH AT BEDTIME AS NEEDED TWICE A DAY 60 tablet 3   No facility-administered medications prior to visit.    Allergies  Allergen Reactions   Pseudoephedrine Hcl Er Other (See Comments)    TACHYCARDIA    Singulair [Montelukast Sodium] Other (See Comments)    Mental status changes, irritable   Cefdinir Hives   Celebrex [Celecoxib] Other (See Comments)     ABDOMINAL PAIN    Ciprofloxacin Other (See Comments)    ABDOMINAL PAIN    Codeine Nausea And Vomiting    NAUSEA VOMITING - can take with zofran    Review of Systems  Musculoskeletal:  Positive for joint pain.       Objective:    Physical Exam Constitutional:      General: She is not in acute distress.    Appearance: Normal appearance. She is not ill-appearing.  HENT:     Head: Normocephalic and atraumatic.     Right Ear: External ear normal.     Left Ear: External ear normal.  Eyes:     Extraocular Movements: Extraocular movements intact.     Pupils: Pupils are equal, round, and reactive to light.  Cardiovascular:     Rate and Rhythm: Normal rate and regular rhythm.     Pulses: Normal pulses.     Heart sounds: Normal heart sounds. No murmur heard.    No gallop.  Pulmonary:     Effort: Pulmonary effort is normal. No respiratory distress.     Breath sounds: Normal breath sounds. No wheezing or rales.  Abdominal:     General: Bowel sounds are normal.  Skin:    General: Skin is warm and dry.  Neurological:     Mental Status: She is alert and oriented to person, place, and time.  Psychiatric:        Mood and Affect: Mood normal.        Behavior: Behavior normal.        Judgment: Judgment normal.     BP 116/74 (BP Location: Left Arm, Patient Position: Sitting, Cuff  Size: Normal)   Pulse 77   Resp 20   Ht 5' 1.5" (1.562 m)   Wt 175 lb (79.4 kg)   SpO2 99%   BMI 32.53 kg/m  Wt Readings from Last 3 Encounters:  10/12/21 175 lb (79.4 kg)  06/08/21 171 lb 12.8 oz (77.9 kg)  04/07/21 167 lb (75.8 kg)    Diabetic Foot Exam - Simple   No data filed    Lab Results  Component Value Date   WBC 5.9 10/05/2021   HGB 12.4 10/05/2021   HCT 37.3 10/05/2021   PLT 234.0 10/05/2021   GLUCOSE 89 10/05/2021   CHOL 205 (H) 10/05/2021   TRIG 208.0 (H) 10/05/2021   HDL 54.20 10/05/2021   LDLDIRECT 120.0 10/05/2021   LDLCALC 119 (H) 06/01/2021   ALT 16 10/05/2021   AST  17 10/05/2021   NA 135 10/05/2021   K 4.5 10/05/2021   CL 101 10/05/2021   CREATININE 0.93 10/05/2021   BUN 16 10/05/2021   CO2 28 10/05/2021   TSH 1.46 10/05/2021   INR 1.19 02/07/2010   HGBA1C 5.8 10/05/2021    Lab Results  Component Value Date   TSH 1.46 10/05/2021   Lab Results  Component Value Date   WBC 5.9 10/05/2021   HGB 12.4 10/05/2021   HCT 37.3 10/05/2021   MCV 91.3 10/05/2021   PLT 234.0 10/05/2021   Lab Results  Component Value Date   NA 135 10/05/2021   K 4.5 10/05/2021   CO2 28 10/05/2021   GLUCOSE 89 10/05/2021   BUN 16 10/05/2021   CREATININE 0.93 10/05/2021   BILITOT 0.4 10/05/2021   ALKPHOS 48 10/05/2021   AST 17 10/05/2021   ALT 16 10/05/2021   PROT 7.0 10/05/2021   ALBUMIN 4.3 10/05/2021   CALCIUM 9.6 10/05/2021   ANIONGAP 8 10/13/2019   GFR 62.04 10/05/2021   Lab Results  Component Value Date   CHOL 205 (H) 10/05/2021   Lab Results  Component Value Date   HDL 54.20 10/05/2021   Lab Results  Component Value Date   LDLCALC 119 (H) 06/01/2021   Lab Results  Component Value Date   TRIG 208.0 (H) 10/05/2021   Lab Results  Component Value Date   CHOLHDL 4 10/05/2021   Lab Results  Component Value Date   HGBA1C 5.8 10/05/2021   Mammogram: Last completed on 01/15/2020. No mammographic evidence of malignancy. Repeat in 1-2 years. Ordered.     Assessment & Plan:   Problem List Items Addressed This Visit     HTN (hypertension) - Primary (Chronic)    Well controlled, no changes to meds. Encouraged heart healthy diet such as the DASH diet and exercise as tolerated.       Iron deficiency anemia (Chronic)    Increase leafy greens, consider increased lean red meat and using cast iron cookware. Continue to monitor, report any concerns      Gastroesophageal reflux disease with hiatal hernia   Urinary frequency    She has been seen by urogyn and they have decided to hold off on a surgical option for now.       Vitamin D  deficiency    Supplement and monitor      Vitamin B12 deficiency    Supplement and monitor      Other Visit Diagnoses     Hyperlipidemia, unspecified hyperlipidemia type       Hyperglycemia       Encounter for screening for malignant neoplasm of  breast, unspecified screening modality       Relevant Orders   MM 3D SCREEN BREAST BILATERAL   Breast cancer screening by mammogram       Relevant Orders   MM 3D SCREEN BREAST BILATERAL       No orders of the defined types were placed in this encounter.  I, Penni Homans, MD, personally preformed the services described in this documentation.  All medical record entries made by the scribe were at my direction and in my presence.  I have reviewed the chart and discharge instructions (if applicable) and agree that the record reflects my personal performance and is accurate and complete. 10/12/2021.  I,Mohammed Iqbal,acting as a scribe for Penni Homans, MD.,have documented all relevant documentation on the behalf of Penni Homans, MD,as directed by  Penni Homans, MD while in the presence of Penni Homans, MD.  Penni Homans, MD

## 2021-10-12 NOTE — Assessment & Plan Note (Signed)
Supplement and monitor 

## 2021-10-13 ENCOUNTER — Other Ambulatory Visit: Payer: Self-pay

## 2021-10-13 ENCOUNTER — Encounter: Payer: Self-pay | Admitting: Family Medicine

## 2021-10-13 DIAGNOSIS — D500 Iron deficiency anemia secondary to blood loss (chronic): Secondary | ICD-10-CM

## 2021-10-13 DIAGNOSIS — I10 Essential (primary) hypertension: Secondary | ICD-10-CM

## 2021-10-13 DIAGNOSIS — E785 Hyperlipidemia, unspecified: Secondary | ICD-10-CM

## 2021-10-13 DIAGNOSIS — E559 Vitamin D deficiency, unspecified: Secondary | ICD-10-CM

## 2021-10-13 DIAGNOSIS — M8580 Other specified disorders of bone density and structure, unspecified site: Secondary | ICD-10-CM

## 2021-10-13 DIAGNOSIS — E538 Deficiency of other specified B group vitamins: Secondary | ICD-10-CM

## 2021-10-13 NOTE — Assessment & Plan Note (Signed)
She has been seen by urogyn and they have decided to hold off on a surgical option for now.

## 2021-10-13 NOTE — Assessment & Plan Note (Signed)
Well controlled, no changes to meds. Encouraged heart healthy diet such as the DASH diet and exercise as tolerated.  °

## 2021-10-13 NOTE — Assessment & Plan Note (Signed)
Increase leafy greens, consider increased lean red meat and using cast iron cookware. Continue to monitor, report any concerns 

## 2021-10-17 ENCOUNTER — Other Ambulatory Visit (HOSPITAL_BASED_OUTPATIENT_CLINIC_OR_DEPARTMENT_OTHER): Payer: Self-pay

## 2021-11-01 ENCOUNTER — Other Ambulatory Visit (HOSPITAL_BASED_OUTPATIENT_CLINIC_OR_DEPARTMENT_OTHER): Payer: Self-pay

## 2021-11-30 ENCOUNTER — Ambulatory Visit
Admission: RE | Admit: 2021-11-30 | Discharge: 2021-11-30 | Disposition: A | Discharge status: Home / Self Care | Payer: Medicare Other | Source: Ambulatory Visit | Attending: Family Medicine | Admitting: Family Medicine

## 2021-11-30 DIAGNOSIS — Z1231 Encounter for screening mammogram for malignant neoplasm of breast: Secondary | ICD-10-CM

## 2021-11-30 DIAGNOSIS — Z1239 Encounter for other screening for malignant neoplasm of breast: Secondary | ICD-10-CM

## 2021-12-05 DIAGNOSIS — H2513 Age-related nuclear cataract, bilateral: Secondary | ICD-10-CM | POA: Diagnosis not present

## 2021-12-05 DIAGNOSIS — H52203 Unspecified astigmatism, bilateral: Secondary | ICD-10-CM | POA: Diagnosis not present

## 2021-12-11 ENCOUNTER — Other Ambulatory Visit (HOSPITAL_BASED_OUTPATIENT_CLINIC_OR_DEPARTMENT_OTHER): Payer: Self-pay

## 2021-12-27 ENCOUNTER — Encounter: Payer: Self-pay | Admitting: Family Medicine

## 2021-12-29 ENCOUNTER — Ambulatory Visit (INDEPENDENT_AMBULATORY_CARE_PROVIDER_SITE_OTHER): Payer: Medicare Other | Admitting: *Deleted

## 2021-12-29 DIAGNOSIS — Z23 Encounter for immunization: Secondary | ICD-10-CM | POA: Diagnosis not present

## 2021-12-29 NOTE — Progress Notes (Signed)
Patient here for high dose flu vaccine.  Vaccine given in left deltoid and patient tolerated well.  

## 2022-01-09 ENCOUNTER — Ambulatory Visit (INDEPENDENT_AMBULATORY_CARE_PROVIDER_SITE_OTHER): Payer: Medicare Other | Admitting: Pulmonary Disease

## 2022-01-09 ENCOUNTER — Encounter: Payer: Self-pay | Admitting: Pulmonary Disease

## 2022-01-09 VITALS — BP 112/68 | HR 77 | Ht 61.0 in | Wt 176.4 lb

## 2022-01-09 DIAGNOSIS — G4733 Obstructive sleep apnea (adult) (pediatric): Secondary | ICD-10-CM

## 2022-01-09 NOTE — Progress Notes (Signed)
Subjective:     Patient ID: Carrie Elliott, female   DOB: [birth year], 71 y.o.   MRN: [id number]   Patient being seen for follow-up of mild obstructive sleep apnea  Continues to be compliant with CPAP on a regular basis  Denies any significant changes in her health  Continues to benefit from the machine  No significant daytime sleepiness No medication changes  Sleep pattern has not really changed much Usually tries to go to bed between 10 and 10:30 PM Takes about 30 minutes to 1 hour to fall asleep Wakes up about once to 3 times during the night Wakes up about 6 AM on workdays  Hypertension Never smoked  Not having any issues   Review of Systems  Constitutional: Negative.  Negative for fatigue.  Eyes: Negative.   Respiratory:  Positive for apnea. Negative for cough.   Gastrointestinal: Negative.   Endocrine: Negative.   Genitourinary: Negative.   Musculoskeletal: Negative.   Skin: Negative.   Hematological: Negative.   Psychiatric/Behavioral:  Positive for sleep disturbance.   All other systems reviewed and are negative.  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, avoids spicy  Other Topics Concern   Not on file  Social History Narrative   Not on file   Social Determinants of Health   Financial Resource Strain: Low Risk  (02/21/2021)   Overall Financial Resource Strain (CARDIA)    Difficulty of Paying Living Expenses: Not hard at all  Food Insecurity: No Food Insecurity (02/21/2021)   Hunger Vital Sign    Worried About Running Out of Food in the Last Year: Never true    [place] in the Last Year: Never true  Transportation Needs: No Transportation Needs (02/21/2021)   PRAPARE - Hydrologist (Medical): No    Lack of Transportation (Non-Medical): No  Physical Activity: Inactive (02/21/2021)   Exercise Vital Sign    Days of Exercise per Week: 0 days    Minutes of Exercise per Session: 0 min  Stress: No Stress Concern Present (02/21/2021)   [place]    Feeling of Stress : Not at all  Social Connections: [place] (02/21/2021)   Social Connection and Isolation Panel [NHANES]  Frequency of Communication with Friends and Family: More than three times a week    Frequency of Social Gatherings with Friends and Family: More than three times a week    Attends Religious Services: More than 4 times per year    Active Member of [employer] or Organizations: Yes    Attends Music therapist: More than 4 times per year    Marital Status: Married  Human resources officer Violence: Not At Risk (02/21/2021)   Humiliation, Afraid, Rape, and Kick questionnaire    Fear of Current or Ex-Partner: No    Emotionally Abused: No    Physically Abused: No    Sexually Abused: No   Family History  Problem Relation Age of Onset   Colon  polyps Father    Hypertension Father    Heart failure Father    Melanoma Father        multiple   Heart attack Father        x2   Heart disease Father        Bundle Block   Meniere's disease Father    Cancer Father        melanoma   Osteoporosis Mother    Transient ischemic attack Mother    Cancer Mother        colon   Heart disease Mother        afib   Hyperlipidemia Mother    Diabetes Paternal Grandmother    Stroke Maternal Grandmother    Cancer Maternal Grandmother        skin, melanoma, sarcoma   Colon polyps Maternal Grandmother    Diverticulitis Maternal Grandmother    Stroke Maternal Grandfather    Heart disease Maternal Grandfather    Obesity Brother        Objective:   Physical Exam Constitutional:      Appearance: Normal appearance. She is obese.  HENT:     Head: Normocephalic and atraumatic.     Mouth/Throat:     Mouth: Mucous membranes are moist.  Eyes:     Pupils: Pupils are equal, round, and reactive to light.  Cardiovascular:     Rate and Rhythm: Normal rate and regular rhythm.     Heart sounds: No murmur heard.    No friction rub.  Pulmonary:     Effort: Pulmonary effort is normal. No respiratory distress.     Breath sounds: Normal breath sounds. No stridor. No wheezing or rhonchi.  Musculoskeletal:     Cervical back: No rigidity or tenderness. No muscular tenderness.  Neurological:     Mental Status: She is alert.  Psychiatric:        Mood and Affect: Mood normal.     Vitals:   01/09/22 1006  BP: 112/68  Pulse: 77  SpO2: 99%      09/30/2018    1:00 PM  Results of the Epworth flowsheet  Sitting and reading 1  Watching TV 1  Sitting, inactive in a public place (e.g. a theatre or a meeting) 1  As a passenger in a car for an hour without a break 2  Lying down to rest in the afternoon when circumstances permit 2  Sitting and talking to someone 0  Sitting quietly after a lunch without alcohol 1  In a car, while stopped for a few  minutes in traffic 0  Total score 8   Sleep study reviewed in epic  Compliance data shows excellent compliance with [employer] of 5-17, 95 percentile pressure of 12.4  Residual AHI of 3.0     Assessment:     Mild obstructive sleep apnea -Symptoms are well controlled  Daytime sleepiness -Appears better  Encouraged to continue use of CPAP     Plan:     Continue CPAP nightly  Weight loss as tolerated  No changes to CPAP pressures  Follow-up a year from now

## 2022-01-09 NOTE — Patient Instructions (Addendum)
Sleep apnea appears well treated  Call with significant concerns  I will see you a year from now   Continue nightly use of your CPAP

## 2022-01-15 ENCOUNTER — Encounter: Payer: Self-pay | Admitting: *Deleted

## 2022-01-15 ENCOUNTER — Other Ambulatory Visit (HOSPITAL_BASED_OUTPATIENT_CLINIC_OR_DEPARTMENT_OTHER): Payer: Self-pay

## 2022-01-22 ENCOUNTER — Telehealth: Payer: Self-pay | Admitting: Family Medicine

## 2022-01-22 NOTE — Telephone Encounter (Signed)
Called pt was advised of the diet and  Pt stated understand.

## 2022-01-22 NOTE — Telephone Encounter (Signed)
Pt has had stomach pains for the past couple days with gas and a little bit of diarrhea. She is scheduled to see Percell Miller tomorrow but would like to know what to do in the meantime.  Please advise.

## 2022-01-23 ENCOUNTER — Ambulatory Visit (INDEPENDENT_AMBULATORY_CARE_PROVIDER_SITE_OTHER): Payer: Medicare Other | Admitting: Medical

## 2022-01-23 ENCOUNTER — Encounter: Payer: Self-pay | Admitting: Medical

## 2022-01-23 ENCOUNTER — Other Ambulatory Visit (HOSPITAL_BASED_OUTPATIENT_CLINIC_OR_DEPARTMENT_OTHER): Payer: Self-pay

## 2022-01-23 VITALS — BP 104/60 | HR 88 | Temp 98.3°F | Resp 18 | Ht 61.0 in | Wt 168.8 lb

## 2022-01-23 DIAGNOSIS — R3129 Other microscopic hematuria: Secondary | ICD-10-CM | POA: Diagnosis not present

## 2022-01-23 DIAGNOSIS — R319 Hematuria, unspecified: Secondary | ICD-10-CM | POA: Diagnosis not present

## 2022-01-23 DIAGNOSIS — R1030 Lower abdominal pain, unspecified: Secondary | ICD-10-CM | POA: Diagnosis not present

## 2022-01-23 DIAGNOSIS — R195 Other fecal abnormalities: Secondary | ICD-10-CM

## 2022-01-23 DIAGNOSIS — R110 Nausea: Secondary | ICD-10-CM | POA: Diagnosis not present

## 2022-01-23 DIAGNOSIS — R1032 Left lower quadrant pain: Secondary | ICD-10-CM | POA: Diagnosis not present

## 2022-01-23 LAB — CBC WITH DIFFERENTIAL/PLATELET
Basophils Absolute: 0 10*3/uL (ref 0.0–0.1)
Basophils Relative: 0.4 % (ref 0.0–3.0)
Eosinophils Absolute: 0 10*3/uL (ref 0.0–0.7)
Eosinophils Relative: 0.3 % (ref 0.0–5.0)
HCT: 36.1 % (ref 36.0–46.0)
Hemoglobin: 12 g/dL (ref 12.0–15.0)
Lymphocytes Relative: 9.6 % — ABNORMAL LOW (ref 12.0–46.0)
Lymphs Abs: 1.2 10*3/uL (ref 0.7–4.0)
MCHC: 33.3 g/dL (ref 30.0–36.0)
MCV: 89.8 fl (ref 78.0–100.0)
Monocytes Absolute: 1.4 10*3/uL — ABNORMAL HIGH (ref 0.1–1.0)
Monocytes Relative: 11.5 % (ref 3.0–12.0)
Neutro Abs: 9.8 10*3/uL — ABNORMAL HIGH (ref 1.4–7.7)
Neutrophils Relative %: 78.2 % — ABNORMAL HIGH (ref 43.0–77.0)
Platelets: 315 10*3/uL (ref 150.0–400.0)
RBC: 4.03 Mil/uL (ref 3.87–5.11)
RDW: 12.6 % (ref 11.5–15.5)
WBC: 12.5 10*3/uL — ABNORMAL HIGH (ref 4.0–10.5)

## 2022-01-23 LAB — COMPREHENSIVE METABOLIC PANEL
ALT: 96 U/L — ABNORMAL HIGH (ref 0–35)
AST: 57 U/L — ABNORMAL HIGH (ref 0–37)
Albumin: 4.1 g/dL (ref 3.5–5.2)
Alkaline Phosphatase: 178 U/L — ABNORMAL HIGH (ref 39–117)
BUN: 15 mg/dL (ref 6–23)
CO2: 27 mEq/L (ref 19–32)
Calcium: 10 mg/dL (ref 8.4–10.5)
Chloride: 95 mEq/L — ABNORMAL LOW (ref 96–112)
Creatinine, Ser: 1.03 mg/dL (ref 0.40–1.20)
GFR: 54.77 mL/min — ABNORMAL LOW (ref >60.00)
Glucose, Bld: 106 mg/dL — ABNORMAL HIGH (ref 70–99)
Potassium: 3.5 mEq/L (ref 3.5–5.1)
Sodium: 133 mEq/L — ABNORMAL LOW (ref 135–145)
Total Bilirubin: 0.7 mg/dL (ref 0.2–1.2)
Total Protein: 8.1 g/dL (ref 6.0–8.3)

## 2022-01-23 LAB — POC URINALSYSI DIPSTICK (AUTOMATED)
Glucose, UA: NEGATIVE
Ketones, UA: 15
Nitrite, UA: NEGATIVE
Protein, UA: NEGATIVE
Spec Grav, UA: 1.02 (ref 1.010–1.025)
Urobilinogen, UA: 0.2 E.U./dL
pH, UA: 5 (ref 5.0–8.0)

## 2022-01-23 LAB — LIPASE: Lipase: 55 U/L (ref 11.0–59.0)

## 2022-01-23 MED ORDER — AMOXICILLIN-POT CLAVULANATE 875-125 MG PO TABS
1.0000 | ORAL_TABLET | Freq: Two times a day (BID) | ORAL | 0 refills | Status: DC
  Filled 2022-01-23: qty 14, 7d supply, fill #0

## 2022-01-23 MED ORDER — METRONIDAZOLE 500 MG PO TABS
500.0000 mg | ORAL_TABLET | Freq: Two times a day (BID) | ORAL | 0 refills | Status: AC
  Filled 2022-01-23: qty 14, 7d supply, fill #0

## 2022-01-23 NOTE — Progress Notes (Signed)
Subjective:    Patient ID: Carrie Elliott, female    DOB: [birth year], 71 y.o.   MRN: [id number]  HPI  Pt in with lower abdomen discomfrot that started past wed. At  first had loose stools. Pt states the loose stools have improved. She is eating small amounts of bland foods. Pt states attempting to stay hydrated.    Pt states currently whenever she eats abdomen pain in lower abdomen will hurts. No pain on urination and no cva area pain.   Nausea couple of day Thursday and frdiay. Low grade fever Thursday, Friday and Saturday. Temp was 99.4.   2020. Multiple small diverticula in sigmoid colon.  On review pt not having epiagastic pain.  No antibiotics in past month.  Current level of abd pain 5/10. But was worse early on.  Review of Systems  Constitutional:  Negative for chills, fatigue and fever.  HENT:  Negative for congestion and dental problem.   Respiratory:  Negative for cough, chest tightness, shortness of breath and wheezing.   Cardiovascular:  Negative for chest pain and palpitations.  Gastrointestinal:  Positive for abdominal pain and nausea. Negative for abdominal distention, blood in stool, constipation, rectal pain and vomiting.       Earl on loose stool and nauseu.  No black or red bloody stools.  Genitourinary:  Negative for dysuria and flank pain.  Musculoskeletal:  Negative for back pain.  Skin:  Negative for rash.  Neurological:  Negative for dizziness, speech difficulty, weakness, numbness and headaches.  Hematological:  Negative for adenopathy. Does not bruise/bleed easily.  Psychiatric/Behavioral:  Negative for behavioral problems and decreased concentration.     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, avoids spicy  Other Topics Concern   Not on file  Social History Narrative   Not on file   Social Determinants of Health   Financial Resource Strain: Low Risk  (02/21/2021)   Overall Financial Resource Strain (CARDIA)    Difficulty of Paying Living Expenses: Not hard at all  Food Insecurity: No Food Insecurity (02/21/2021)   Hunger Vital Sign    Worried About Running Out of Food in the Last Year: Never true    Ran Out of Food in the Last Year: Never true  Transportation  Needs: No Transportation Needs (02/21/2021)   PRAPARE - Hydrologist (Medical): No    Lack of Transportation (Non-Medical): No  Physical Activity: Inactive (02/21/2021)   Exercise Vital Sign    Days of Exercise per Week: 0 days    Minutes of Exercise per Session: 0 min  Stress: No Stress Concern Present (02/21/2021)   [place]    Feeling of Stress : Not at all  Social Connections: [place] (02/21/2021)   Social Connection and Isolation Panel [NHANES]    Frequency of Communication with Friends and Family: More than three times a week     Frequency of Social Gatherings with Friends and Family: More than three times a week    Attends Religious Services: More than 4 times per year    Active Member of Clubs or Organizations: Yes    Attends Archivist Meetings: More than 4 times per year    Marital Status: Married  Human resources officer Violence: Not At Risk (02/21/2021)   Humiliation, Afraid, Rape, and Kick questionnaire    Fear of Current or Ex-Partner: No    Emotionally Abused: No    Physically Abused: No    Sexually Abused: No    Past Surgical History:  Procedure Laterality Date   APPENDECTOMY  1989   atypical nevus excision     BREAST BIOPSY Right    CHOLECYSTECTOMY  1989   COLONOSCOPY WITH PROPOFOL N/A 05/27/2015   Procedure: COLONOSCOPY WITH PROPOFOL;  Surgeon: Clarene Essex, MD;  Location: [facility];  Service: Endoscopy;  Laterality: N/A;   CYSTOSCOPY W/ URETERAL STENT REMOVAL  1999   ESOPHAGOGASTRODUODENOSCOPY  2011   with esophageal stent, perforation    ESOPHAGOGASTRODUODENOSCOPY (EGD) WITH PROPOFOL N/A 05/27/2015   Procedure: ESOPHAGOGASTRODUODENOSCOPY (EGD) WITH PROPOFOL;  Surgeon: Clarene Essex, MD;  Location: [facility];  Service: Endoscopy;  Laterality: N/A;   HEMORRHOID SURGERY  2005   LEFT OOPHORECTOMY  8786   complicated by ureteral injuery requiring a ureteral stent    MMK / ANTERIOR VESICOURETHROPEXY / URETHROPEXY  2000   TONSILLECTOMY AND ADENOIDECTOMY     TOTAL ABDOMINAL HYSTERECTOMY  2000   with USO   TUBAL LIGATION  1981   wisdom teeth removal  1964    Family History  Problem Relation Age of Onset   Colon polyps Father    Hypertension Father    Heart failure Father    Melanoma Father        multiple   Heart attack Father        x2   Heart disease Father        Bundle Block   Meniere's disease Father    Cancer Father        melanoma   Osteoporosis Mother    Transient ischemic attack Mother    Cancer Mother        colon   Heart disease Mother        afib   Hyperlipidemia  Mother    Diabetes Paternal Grandmother    Stroke Maternal Grandmother    Cancer Maternal Grandmother        skin, melanoma, sarcoma   Colon polyps Maternal Grandmother    Diverticulitis Maternal Grandmother    Stroke Maternal Grandfather    Heart disease Maternal Grandfather    Obesity Brother     Allergies  Allergen Reactions   Pseudoephedrine Hcl Er Other (See Comments)    TACHYCARDIA  Singulair [Montelukast Sodium] Other (See Comments)    Mental status changes, irritable   Cefdinir Hives   Celebrex [Celecoxib] Other (See Comments)    ABDOMINAL PAIN    Ciprofloxacin Other (See Comments)    ABDOMINAL PAIN    Codeine Nausea And Vomiting    NAUSEA VOMITING - can take with zofran    Current Outpatient Medications on File Prior to Visit  Medication Sig Dispense Refill   benzocaine (ORAJEL) 10 % mucosal gel Use as directed 1 application in the mouth or throat as needed for mouth pain. Reported on 07/22/2015     Cholecalciferol (VITAMIN D3) 50 MCG (2000 UT) TABS Take by mouth. Take 1 tablet daily Monday-Friday. Take 2 tablets on Saturday.     esomeprazole (NEXIUM) 40 MG capsule TAKE 1 CAPSULE BY MOUTH ONCE A DAY 90 capsule 3   esomeprazole (NEXIUM) 40 MG capsule Take 1 capsule by mouth once a day 90 capsule 4   famotidine (PEPCID) 20 MG tablet TAKE 1 TABLET BY MOUTH AT BEDTIME AS NEEDED TWICE A DAY 60 tablet 3   famotidine (PEPCID) 20 MG tablet Take 1 tablet by mouth twice a day as needed 180 tablet 2   fluticasone (FLONASE) 50 MCG/ACT nasal spray Place 2 sprays into both nostrils daily. 16 g 6   hydrochlorothiazide (MICROZIDE) 12.5 MG capsule TAKE 1 CAPSULE (12.5 MG TOTAL) BY MOUTH DAILY. TAKE IF > 110/70 90 capsule 1   hydrochlorothiazide (MICROZIDE) 12.5 MG capsule TAKE 1 CAPSULE (12.5 MG TOTAL) BY MOUTH DAILY. TAKE IF > 110/70 90 capsule 2   Lidocaine-Hydrocort, Perianal, 3-0.5 % CREA Apply rectally as directed once a day if needed 98 g 2   Lidocaine-Hydrocortisone Ace 3-0.5 %  CREA Apply 1 application topically daily as needed (hemmoriods.).      lisinopril (ZESTRIL) 10 MG tablet TAKE 1 TABLET (10 MG TOTAL) BY MOUTH 2 (TWO) TIMES DAILY. 180 tablet 1   metoprolol succinate (TOPROL-XL) 50 MG 24 hr tablet TAKE 1 - 2 TABLETS BY MOUTH TWO TIMES DAILY WITH OR IMMEDIATELY FOLLOWING A MEAL 360 tablet 1   ondansetron (ZOFRAN) 8 MG tablet Take 1 tablet (8 mg total) by mouth every 8 (eight) hours as needed for nausea or vomiting. 90 tablet 1   SIMPLY SALINE NA Place 1 each into the nose daily.      No current facility-administered medications on file prior to visit.    BP 104/60   Pulse 88   Temp 98.3 F (36.8 C)   Resp 18   Ht '5\' 1"'$  (1.549 m)   Wt 168 lb 12.8 oz (76.6 kg)   SpO2 97%   BMI 31.89 kg/m        Objective:   Physical Exam  General Mental Status- Alert. General Appearance- Not in acute distress.   Skin General: Color- Normal Color. Moisture- Normal Moisture.  Neck Carotid Arteries- Normal color. Moisture- Normal Moisture. No carotid bruits. No JVD.  Chest and Lung Exam Auscultation: Breath Sounds:-Normal.  Cardiovascular Auscultation:Rythm- Regular. Murmurs & Other Heart Sounds:Auscultation of the heart reveals- No Murmurs.  Abdomen Inspection:-Inspeection Normal. Palpation/Percussion:Note:No mass. Palpation and Percussion of the abdomen reveal- faint tender diffuse lower abdomen Tender but definitely more pain llq compared to mid or rlq, Non Distended + BS, no rebound or guarding.   Back- no cva area tenderness.  Neurologic Cranial Nerve exam:- CN III-XII intact(No nystagmus), symmetric smile. Strength:- 5/5 equal and symmetric strength both upper and lower extremities.       Assessment &  Plan:   Patient Instructions  Abdomen pain lower abd. Most in llq. Known diverticulum in sigmoid colon. Will get cbc, cmp, lipase stat.  Will get ua and urine culture.  If your loose stools reoccur then collect stool studies and return.    Make sure eating more and staying hydrated.  If signs/symptoms worsen or change then be seen in ED.  Ct abdomen pelvis to evaluate diverticulitis.  Rx flagyl antibiotic and augmentin.(Some challenge on choosing second antibiotic after flagyl due to allergy hx. Looks like you have been on levfloxin. On further discussion pt is sure she has been on augmentin so I decided to go ahead and prescribe.  Follow up date to be determined after lab and imaging review.     Mackie Pai, PA-C

## 2022-01-23 NOTE — Patient Instructions (Addendum)
Abdomen pain lower abd. Most in llq. Known diverticulum in sigmoid colon. Will get cbc, cmp, lipase stat.  Will get ua and urine culture.  If your loose stools reoccur then collect stool studies and return.   Make sure eating more and staying hydrated.  If signs/symptoms worsen or change then be seen in ED.  Ct abdomen pelvis to evaluate diverticulitis.  Rx flagyl antibiotic and augmentin.(Some challenge on choosing second antibiotic after flagyl due to allergy hx. Looks like you have been on levfloxin. On further discussion pt is sure she has been on augmentin so I decided to go ahead and prescribe.  Follow up date to be determined after lab and imaging review.

## 2022-01-24 LAB — URINE CULTURE
MICRO NUMBER:: 14124390
Result:: NO GROWTH
SPECIMEN QUALITY:: ADEQUATE

## 2022-01-25 ENCOUNTER — Ambulatory Visit (HOSPITAL_BASED_OUTPATIENT_CLINIC_OR_DEPARTMENT_OTHER)
Admission: RE | Admit: 2022-01-25 | Discharge: 2022-01-25 | Disposition: A | Discharge status: Home / Self Care | Payer: Medicare Other | Source: Ambulatory Visit | Attending: Medical | Admitting: Medical

## 2022-01-25 DIAGNOSIS — R1032 Left lower quadrant pain: Secondary | ICD-10-CM | POA: Insufficient documentation

## 2022-01-25 DIAGNOSIS — K760 Fatty (change of) liver, not elsewhere classified: Secondary | ICD-10-CM | POA: Diagnosis not present

## 2022-01-25 DIAGNOSIS — N2889 Other specified disorders of kidney and ureter: Secondary | ICD-10-CM | POA: Diagnosis not present

## 2022-01-25 MED ORDER — IOHEXOL 300 MG/ML  SOLN
100.0000 mL | Freq: Once | INTRAMUSCULAR | Status: AC | PRN
  Administered 2022-01-25: 100 mL via INTRAVENOUS

## 2022-02-01 ENCOUNTER — Other Ambulatory Visit: Payer: Self-pay | Admitting: Family Medicine

## 2022-02-01 ENCOUNTER — Other Ambulatory Visit (HOSPITAL_BASED_OUTPATIENT_CLINIC_OR_DEPARTMENT_OTHER): Payer: Self-pay

## 2022-02-01 MED ORDER — LISINOPRIL 10 MG PO TABS
10.0000 mg | ORAL_TABLET | Freq: Two times a day (BID) | ORAL | 1 refills | Status: DC
  Filled 2022-02-01: qty 180, 90d supply, fill #0
  Filled 2022-05-04: qty 180, 90d supply, fill #1

## 2022-02-01 NOTE — Addendum Note (Signed)
Addended by: Anabel Halon on: 02/01/2022 05:08 PM   Modules accepted: Orders

## 2022-02-05 ENCOUNTER — Other Ambulatory Visit (HOSPITAL_BASED_OUTPATIENT_CLINIC_OR_DEPARTMENT_OTHER): Payer: Self-pay

## 2022-02-05 ENCOUNTER — Other Ambulatory Visit (INDEPENDENT_AMBULATORY_CARE_PROVIDER_SITE_OTHER): Payer: Medicare Other

## 2022-02-05 ENCOUNTER — Encounter: Payer: Self-pay | Admitting: Medical

## 2022-02-05 DIAGNOSIS — E559 Vitamin D deficiency, unspecified: Secondary | ICD-10-CM

## 2022-02-05 DIAGNOSIS — Z23 Encounter for immunization: Secondary | ICD-10-CM | POA: Diagnosis not present

## 2022-02-05 DIAGNOSIS — E538 Deficiency of other specified B group vitamins: Secondary | ICD-10-CM | POA: Diagnosis not present

## 2022-02-05 DIAGNOSIS — I10 Essential (primary) hypertension: Secondary | ICD-10-CM | POA: Diagnosis not present

## 2022-02-05 DIAGNOSIS — E785 Hyperlipidemia, unspecified: Secondary | ICD-10-CM | POA: Diagnosis not present

## 2022-02-05 DIAGNOSIS — R3129 Other microscopic hematuria: Secondary | ICD-10-CM

## 2022-02-05 DIAGNOSIS — D500 Iron deficiency anemia secondary to blood loss (chronic): Secondary | ICD-10-CM

## 2022-02-05 LAB — LIPID PANEL
Cholesterol: 200 mg/dL (ref 0–200)
HDL: 53.6 mg/dL (ref >39.00)
LDL Cholesterol: 116 mg/dL — ABNORMAL HIGH (ref 0–99)
NonHDL: 146.84
Total CHOL/HDL Ratio: 4
Triglycerides: 155 mg/dL — ABNORMAL HIGH (ref 0.0–149.0)
VLDL: 31 mg/dL (ref 0.0–40.0)

## 2022-02-05 LAB — POC URINALSYSI DIPSTICK (AUTOMATED)
Bilirubin, UA: NEGATIVE
Glucose, UA: NEGATIVE
Ketones, UA: NEGATIVE
Leukocytes, UA: NEGATIVE
Nitrite, UA: NEGATIVE
Protein, UA: NEGATIVE
Spec Grav, UA: 1.01 (ref 1.010–1.025)
Urobilinogen, UA: 0.2 E.U./dL
pH, UA: 6.5 (ref 5.0–8.0)

## 2022-02-05 LAB — COMPREHENSIVE METABOLIC PANEL
ALT: 20 U/L (ref 0–35)
AST: 15 U/L (ref 0–37)
Albumin: 3.9 g/dL (ref 3.5–5.2)
Alkaline Phosphatase: 52 U/L (ref 39–117)
BUN: 10 mg/dL (ref 6–23)
CO2: 29 mEq/L (ref 19–32)
Calcium: 9 mg/dL (ref 8.4–10.5)
Chloride: 102 mEq/L (ref 96–112)
Creatinine, Ser: 0.7 mg/dL (ref 0.40–1.20)
GFR: 87.04 mL/min (ref >60.00)
Glucose, Bld: 85 mg/dL (ref 70–99)
Potassium: 3.6 mEq/L (ref 3.5–5.1)
Sodium: 138 mEq/L (ref 135–145)
Total Bilirubin: 0.4 mg/dL (ref 0.2–1.2)
Total Protein: 6.4 g/dL (ref 6.0–8.3)

## 2022-02-05 LAB — CBC WITH DIFFERENTIAL/PLATELET
Basophils Absolute: 0.1 10*3/uL (ref 0.0–0.1)
Basophils Relative: 1.1 % (ref 0.0–3.0)
Eosinophils Absolute: 0.1 10*3/uL (ref 0.0–0.7)
Eosinophils Relative: 1.2 % (ref 0.0–5.0)
HCT: 34.1 % — ABNORMAL LOW (ref 36.0–46.0)
Hemoglobin: 11.2 g/dL — ABNORMAL LOW (ref 12.0–15.0)
Lymphocytes Relative: 23 % (ref 12.0–46.0)
Lymphs Abs: 1.5 10*3/uL (ref 0.7–4.0)
MCHC: 32.9 g/dL (ref 30.0–36.0)
MCV: 90.7 fl (ref 78.0–100.0)
Monocytes Absolute: 0.5 10*3/uL (ref 0.1–1.0)
Monocytes Relative: 8 % (ref 3.0–12.0)
Neutro Abs: 4.3 10*3/uL (ref 1.4–7.7)
Neutrophils Relative %: 66.7 % (ref 43.0–77.0)
Platelets: 314 10*3/uL (ref 150.0–400.0)
RBC: 3.76 Mil/uL — ABNORMAL LOW (ref 3.87–5.11)
RDW: 13.5 % (ref 11.5–15.5)
WBC: 6.4 10*3/uL (ref 4.0–10.5)

## 2022-02-05 LAB — VITAMIN D 25 HYDROXY (VIT D DEFICIENCY, FRACTURES): VITD: 32.69 ng/mL (ref 30.00–100.00)

## 2022-02-05 LAB — TSH: TSH: 0.78 u[IU]/mL (ref 0.35–5.50)

## 2022-02-05 LAB — VITAMIN B12: Vitamin B-12: 352 pg/mL (ref 211–911)

## 2022-02-05 MED ORDER — COMIRNATY 30 MCG/0.3ML IM SUSY
PREFILLED_SYRINGE | INTRAMUSCULAR | 0 refills | Status: DC
  Filled 2022-02-05: qty 0.3, 1d supply, fill #0

## 2022-02-05 NOTE — Addendum Note (Signed)
Addended by: Anabel Halon on: 02/05/2022 05:25 PM   Modules accepted: Orders

## 2022-02-05 NOTE — Addendum Note (Signed)
Addended by: Manuela Schwartz on: 02/05/2022 09:29 AM   Modules accepted: Orders

## 2022-02-06 ENCOUNTER — Other Ambulatory Visit: Payer: Self-pay

## 2022-02-06 DIAGNOSIS — I10 Essential (primary) hypertension: Secondary | ICD-10-CM

## 2022-02-06 LAB — URINE CULTURE
MICRO NUMBER:: 14180256
SPECIMEN QUALITY:: ADEQUATE

## 2022-02-11 NOTE — Assessment & Plan Note (Signed)
Well controlled, no changes to meds. Encouraged heart healthy diet such as the DASH diet and exercise as tolerated.  °

## 2022-02-11 NOTE — Assessment & Plan Note (Addendum)
Bone density shows osteopenia, which is thinner than normal but not as bad as osteoporosis. Recommend calcium intake of 1200 to 1500 mg daily, divided into roughly 3 doses. Best source is the diet and a single dairy serving is about 500 mg, a supplement of calcium citrate once or twice daily to balance diet is fine if not getting enough in diet. Also need Vitamin D 2000 IU caps, 1 cap daily if not already taking vitamin D. Also recommend weight baring exercise on hips and upper body to keep bones strong  consider repeating Dexa in 2024 or 2025

## 2022-02-11 NOTE — Assessment & Plan Note (Signed)
Supplement and monitor 

## 2022-02-12 ENCOUNTER — Ambulatory Visit (INDEPENDENT_AMBULATORY_CARE_PROVIDER_SITE_OTHER): Payer: Medicare Other | Admitting: Family Medicine

## 2022-02-12 ENCOUNTER — Encounter: Payer: Self-pay | Admitting: Family Medicine

## 2022-02-12 VITALS — BP 122/64 | HR 70 | Temp 98.0°F | Resp 16 | Ht 61.0 in | Wt 173.0 lb

## 2022-02-12 DIAGNOSIS — I10 Essential (primary) hypertension: Secondary | ICD-10-CM

## 2022-02-12 DIAGNOSIS — R319 Hematuria, unspecified: Secondary | ICD-10-CM | POA: Diagnosis not present

## 2022-02-12 DIAGNOSIS — K5790 Diverticulosis of intestine, part unspecified, without perforation or abscess without bleeding: Secondary | ICD-10-CM

## 2022-02-12 DIAGNOSIS — E538 Deficiency of other specified B group vitamins: Secondary | ICD-10-CM | POA: Diagnosis not present

## 2022-02-12 DIAGNOSIS — M8580 Other specified disorders of bone density and structure, unspecified site: Secondary | ICD-10-CM

## 2022-02-12 DIAGNOSIS — E559 Vitamin D deficiency, unspecified: Secondary | ICD-10-CM | POA: Diagnosis not present

## 2022-02-12 NOTE — Assessment & Plan Note (Signed)
Has worked with Dr Wannetta Sender (urogyn) and Dr Sabra Heck (gyn) and for now the patient is choosing not to proceed with surgery

## 2022-02-12 NOTE — Patient Instructions (Addendum)
Call urology to get your appt.   Diverticulitis  Diverticulitis is infection or inflammation of small pouches (diverticula) in the colon that form due to a condition called diverticulosis. Diverticula can trap stool (feces) and bacteria, causing infection and inflammation. Diverticulitis may cause severe stomach pain and diarrhea. It may lead to tissue damage in the colon that causes bleeding or blockage. The diverticula may also burst (rupture) and cause infected stool to enter other areas of the abdomen. What are the causes? This condition is caused by stool becoming trapped in the diverticula, which allows bacteria to grow in the diverticula. This leads to inflammation and infection. What increases the risk? You are more likely to develop this condition if you have diverticulosis. The risk increases if you: Are overweight or obese. Do not get enough exercise. Drink alcohol. Use tobacco products. Eat a diet that has a lot of red meat such as beef, pork, or lamb. Eat a diet that does not include enough fiber. High-fiber foods include fruits, vegetables, beans, nuts, and whole grains. Are over 69 years of age. What are the signs or symptoms? Symptoms of this condition may include: Pain and tenderness in the abdomen. The pain is normally located on the left side of the abdomen, but it may occur in other areas. Fever and chills. Nausea. Vomiting. Cramping. Bloating. Changes in bowel routines. Blood in your stool. How is this diagnosed? This condition is diagnosed based on: Your medical history. A physical exam. Tests to make sure there is nothing else causing your condition. These tests may include: Blood tests. Urine tests. CT scan of the abdomen. How is this treated? Most cases of this condition are mild and can be treated at home. Treatment may include: Taking over-the-counter pain medicines. Following a clear liquid diet. Taking antibiotic medicines by mouth. Resting. More  severe cases may need to be treated at a hospital. Treatment may include: Not eating or drinking. Taking prescription pain medicine. Receiving antibiotic medicines through an IV. Receiving fluids and nutrition through an IV. Surgery. When your condition is under control, your health care provider may recommend that you have a colonoscopy. This is an exam to look at the entire large intestine. During the exam, a lubricated, bendable tube is inserted into the anus and then passed into the rectum, colon, and other parts of the large intestine. A colonoscopy can show how severe your diverticula are and whether something else may be causing your symptoms. Follow these instructions at home: Medicines Take over-the-counter and prescription medicines only as told by your health care provider. These include fiber supplements, probiotics, and stool softeners. If you were prescribed an antibiotic medicine, take it as told by your health care provider. Do not stop taking the antibiotic even if you start to feel better. Ask your health care provider if the medicine prescribed to you requires you to avoid driving or using machinery. Eating and drinking  Follow a full liquid diet or another diet as directed by your health care provider. After your symptoms improve, your health care provider may tell you to change your diet. He or she may recommend that you eat a diet that contains at least 25 grams (25 g) of fiber daily. Fiber makes it easier to pass stool. Healthy sources of fiber include: Berries. One cup contains 4-8 grams of fiber. Beans or lentils. One-half cup contains 5-8 grams of fiber. Green vegetables. One cup contains 4 grams of fiber. Avoid eating red meat. General instructions Do not use any  products that contain nicotine or tobacco, such as cigarettes, e-cigarettes, and chewing tobacco. If you need help quitting, ask your health care provider. Exercise for at least 30 minutes, 3 times each week.  You should exercise hard enough to raise your heart rate and break a sweat. Keep all follow-up visits as told by your health care provider. This is important. You may need to have a colonoscopy. Contact a health care provider if: Your pain does not improve. Your bowel movements do not return to normal. Get help right away if: Your pain gets worse. Your symptoms do not get better with treatment. Your symptoms suddenly get worse. You have a fever. You vomit more than one time. You have stools that are bloody, black, or tarry. Summary Diverticulitis is infection or inflammation of small pouches (diverticula) in the colon that form due to a condition called diverticulosis. Diverticula can trap stool (feces) and bacteria, causing infection and inflammation. You are at higher risk for this condition if you have diverticulosis and you eat a diet that does not include enough fiber. Most cases of this condition are mild and can be treated at home. More severe cases may need to be treated at a hospital. When your condition is under control, your health care provider may recommend that you have an exam called a colonoscopy. This exam can show how severe your diverticula are and whether something else may be causing your symptoms. Keep all follow-up visits as told by your health care provider. This is important. This information is not intended to replace advice given to you by your health care provider. Make sure you discuss any questions you have with your health care provider. Document Revised: 12/22/2018 Document Reviewed: 12/22/2018 Elsevier Patient Education  [place].

## 2022-02-12 NOTE — Progress Notes (Signed)
Subjective:   By signing my name below, I, Carrie Elliott, attest that this documentation has been prepared under the direction and in the presence of Willette Alma MD, 02/12/2022     Patient ID: Carrie Elliott, female    DOB: Jul 16, 1950, 71 y.o.   MRN: [id number]  Chief Complaint  Patient presents with   Follow-up    Follow up     HPI Patient is in today for an office visit. She is accompanied by her husband.   Diverticulum She has a history of diverticulum in the sigmoid colon. She reports that she was seen by [facility] PA for lower abdominal pain on 01/23/2022. She states that traces of blood was found in her urine. She was prescribed 875-125 mg Amoxicillin and 500 mg of Metronidazole. She states that symptoms are subsiding but more prevalent. She is consuming adequate amounts of water. She is producing a bowel movement daily. She states that she previously used a [employer] branded digestive supplement and it caused her worsening side effects. She notes that she has a history of traces of blood that later was resolved. She is not sure if she should follow through with more studies due to the traces of blood found.   Stool Test She states that her stools are normal.   She reports no recent febrile illness or hospitalizations. Denies CP/ palp/ SOB/ HA/ congestion/fevers/GI or GU c/o. Taking meds as prescribed.   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    Past Surgical History:  Procedure  Laterality Date   APPENDECTOMY  1989   atypical nevus excision     BREAST BIOPSY Right    CHOLECYSTECTOMY  1989   COLONOSCOPY WITH PROPOFOL N/A 05/27/2015   Procedure: COLONOSCOPY WITH PROPOFOL;  Surgeon: Clarene Essex, MD;  Location: [facility];  Service: Endoscopy;  Laterality: N/A;   CYSTOSCOPY W/ URETERAL STENT REMOVAL  1999   ESOPHAGOGASTRODUODENOSCOPY  2011   with esophageal stent, perforation    ESOPHAGOGASTRODUODENOSCOPY (EGD) WITH PROPOFOL N/A 05/27/2015   Procedure: ESOPHAGOGASTRODUODENOSCOPY (EGD) WITH PROPOFOL;  Surgeon: Clarene Essex, MD;  Location: [facility];  Service: Endoscopy;  Laterality: N/A;   HEMORRHOID SURGERY  2005   LEFT OOPHORECTOMY  2694   complicated by ureteral injuery requiring a ureteral stent    MMK / ANTERIOR VESICOURETHROPEXY / URETHROPEXY  2000   TONSILLECTOMY AND ADENOIDECTOMY     TOTAL ABDOMINAL HYSTERECTOMY  2000   with USO   TUBAL LIGATION  1981   wisdom teeth removal  1964    Family History  Problem Relation Age of Onset   Colon polyps Father    Hypertension Father    Heart failure Father    Melanoma Father        multiple   Heart attack Father        x2   Heart disease Father        Bundle Block   Meniere's disease  Father    Cancer Father        melanoma   Osteoporosis Mother    Transient ischemic attack Mother    Cancer Mother        colon   Heart disease Mother        afib   Hyperlipidemia Mother    Diabetes Paternal Grandmother    Stroke Maternal Grandmother    Cancer Maternal Grandmother        skin, melanoma, sarcoma   Colon polyps Maternal Grandmother    Diverticulitis Maternal Grandmother    Stroke Maternal Grandfather    Heart disease Maternal Grandfather    Obesity Brother     Social History   Socioeconomic History   Marital status: Married    Spouse name: Not on file   Number of children: Not on file   Years of education: Not on file   Highest education level: Not on file  Occupational History   Not on  file  Tobacco Use   Smoking status: Never   Smokeless tobacco: Never  Vaping Use   Vaping Use: Never used  Substance and Sexual Activity   Alcohol use: No   Drug use: No   Sexual activity: Yes    Birth control/protection: Surgical    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, avoids spicy  Other Topics Concern   Not on file  Social History Narrative   Not on file   Social Determinants of Health   Financial Resource Strain: Low Risk  (02/21/2021)   Overall Financial Resource Strain (CARDIA)    Difficulty of Paying Living Expenses: Not hard at all  Food Insecurity: No Food Insecurity (02/21/2021)   Hunger Vital Sign    Worried About Running Out of Food in the Last Year: Never true    [place] in the Last Year: Never true  Transportation Needs: No Transportation Needs (02/21/2021)   PRAPARE - Hydrologist (Medical): No    Lack of Transportation (Non-Medical): No  Physical Activity: Inactive (02/21/2021)   Exercise Vital Sign    Days of Exercise per Week: 0 days    Minutes of Exercise per Session: 0 min  Stress: No Stress Concern Present (02/21/2021)   [place]    Feeling of Stress : Not at all  Social Connections: [place] (02/21/2021)   Social Connection and Isolation Panel [NHANES]    Frequency of Communication with Friends and Family: More than three times a week    Frequency of Social Gatherings with Friends and Family: More than three times a week    Attends Religious Services: More than 4 times per year    Active Member of [employer] or Organizations: Yes    Attends Music therapist: More than 4 times per year    Marital Status: Married  Human resources officer Violence: Not At Risk (02/21/2021)   Humiliation, Afraid, Rape, and Kick questionnaire    Fear of Current or Ex-Partner: No    Emotionally Abused: No    Physically Abused: No     Sexually Abused: No    Outpatient Medications Prior to Visit  Medication Sig Dispense Refill   benzocaine (ORAJEL) 10 % mucosal gel Use as directed 1 application in the mouth or throat as needed for mouth pain. Reported on 07/22/2015     Cholecalciferol (VITAMIN D3) 50 MCG (2000 UT) TABS Take by mouth. Take 1 tablet daily Monday-Friday. Take  2 tablets on Saturday.     COVID-19 mRNA vaccine 2023-2024 (COMIRNATY) syringe Inject into the muscle. 0.3 mL 0   esomeprazole (NEXIUM) 40 MG capsule TAKE 1 CAPSULE BY MOUTH ONCE A DAY 90 capsule 3   esomeprazole (NEXIUM) 40 MG capsule Take 1 capsule by mouth once a day 90 capsule 4   famotidine (PEPCID) 20 MG tablet Take 1 tablet by mouth twice a day as needed 180 tablet 2   fluticasone (FLONASE) 50 MCG/ACT nasal spray Place 2 sprays into both nostrils daily. 16 g 6   hydrochlorothiazide (MICROZIDE) 12.5 MG capsule TAKE 1 CAPSULE (12.5 MG TOTAL) BY MOUTH DAILY. TAKE IF > 110/70 90 capsule 2   Lidocaine-Hydrocort, Perianal, 3-0.5 % CREA Apply rectally as directed once a day if needed 98 g 2   Lidocaine-Hydrocortisone Ace 3-0.5 % CREA Apply 1 application topically daily as needed (hemmoriods.).      lisinopril (ZESTRIL) 10 MG tablet Take 1 tablet (10 mg total) by mouth 2 (two) times daily. 180 tablet 1   metoprolol succinate (TOPROL-XL) 50 MG 24 hr tablet TAKE 1 - 2 TABLETS BY MOUTH TWO TIMES DAILY WITH OR IMMEDIATELY FOLLOWING A MEAL 360 tablet 1   ondansetron (ZOFRAN) 8 MG tablet Take 1 tablet (8 mg total) by mouth every 8 (eight) hours as needed for nausea or vomiting. 90 tablet 1   SIMPLY SALINE NA Place 1 each into the nose daily.      amoxicillin-clavulanate (AUGMENTIN) 875-125 MG tablet Take 1 tablet by mouth 2 (two) times daily. (Patient not taking: Reported on 02/12/2022) 14 tablet 0   famotidine (PEPCID) 20 MG tablet TAKE 1 TABLET BY MOUTH AT BEDTIME AS NEEDED TWICE A DAY 60 tablet 3   hydrochlorothiazide (MICROZIDE) 12.5 MG capsule TAKE 1 CAPSULE  (12.5 MG TOTAL) BY MOUTH DAILY. TAKE IF > 110/70 90 capsule 1   No facility-administered medications prior to visit.    Allergies  Allergen Reactions   Pseudoephedrine Hcl Er Other (See Comments)    TACHYCARDIA    Singulair [Montelukast Sodium] Other (See Comments)    Mental status changes, irritable   Cefdinir Hives   Celebrex [Celecoxib] Other (See Comments)    ABDOMINAL PAIN    Ciprofloxacin Other (See Comments)    ABDOMINAL PAIN    Codeine Nausea And Vomiting    NAUSEA VOMITING - can take with zofran    Review of Systems  Constitutional:  Negative for fever.  HENT:  Negative for congestion.   Respiratory:  Negative for shortness of breath.   Cardiovascular:  Negative for chest pain and palpitations.  Gastrointestinal: Negative.   Genitourinary: Negative.   Neurological:  Negative for headaches.       Objective:    Physical Exam Constitutional:      General: She is not in acute distress.    Appearance: Normal appearance. She is not ill-appearing.  HENT:     Head: Normocephalic and atraumatic.     Right Ear: External ear normal.     Left Ear: External ear normal.  Eyes:     Extraocular Movements: Extraocular movements intact.     Pupils: Pupils are equal, round, and reactive to light.  Cardiovascular:     Rate and Rhythm: Normal rate and regular rhythm.     Heart sounds: Normal heart sounds. No murmur heard.    No gallop.  Pulmonary:     Effort: Pulmonary effort is normal. No respiratory distress.     Breath sounds: Normal breath sounds.  No wheezing or rales.  Abdominal:     General: Bowel sounds are normal.     Tenderness: There is no abdominal tenderness.  Musculoskeletal:     Right lower leg: No edema.     Left lower leg: No edema.  Skin:    General: Skin is warm and dry.  Neurological:     Mental Status: She is alert and oriented to person, place, and time.  Psychiatric:        Judgment: Judgment normal.    BP 122/64 (BP Location: Right Arm,  Patient Position: Sitting, Cuff Size: Normal)   Pulse 70   Temp 98 F (36.7 C) (Oral)   Resp 16   Ht '5\' 1"'$  (1.549 m)   Wt 173 lb (78.5 kg)   SpO2 98%   BMI 32.69 kg/m  Wt Readings from Last 3 Encounters:  02/12/22 173 lb (78.5 kg)  01/23/22 168 lb 12.8 oz (76.6 kg)  01/09/22 176 lb 6.4 oz (80 kg)    Diabetic Foot Exam - Simple   No data filed    Lab Results  Component Value Date   WBC 6.4 02/05/2022   HGB 11.2 (L) 02/05/2022   HCT 34.1 (L) 02/05/2022   PLT 314.0 02/05/2022   GLUCOSE 85 02/05/2022   CHOL 200 02/05/2022   TRIG 155.0 (H) 02/05/2022   HDL 53.60 02/05/2022   LDLDIRECT 120.0 10/05/2021   LDLCALC 116 (H) 02/05/2022   ALT 20 02/05/2022   AST 15 02/05/2022   NA 138 02/05/2022   K 3.6 02/05/2022   CL 102 02/05/2022   CREATININE 0.70 02/05/2022   BUN 10 02/05/2022   CO2 29 02/05/2022   TSH 0.78 02/05/2022   INR 1.19 02/07/2010   HGBA1C 5.8 10/05/2021    Lab Results  Component Value Date   TSH 0.78 02/05/2022   Lab Results  Component Value Date   WBC 6.4 02/05/2022   HGB 11.2 (L) 02/05/2022   HCT 34.1 (L) 02/05/2022   MCV 90.7 02/05/2022   PLT 314.0 02/05/2022   Lab Results  Component Value Date   NA 138 02/05/2022   K 3.6 02/05/2022   CO2 29 02/05/2022   GLUCOSE 85 02/05/2022   BUN 10 02/05/2022   CREATININE 0.70 02/05/2022   BILITOT 0.4 02/05/2022   ALKPHOS 52 02/05/2022   AST 15 02/05/2022   ALT 20 02/05/2022   PROT 6.4 02/05/2022   ALBUMIN 3.9 02/05/2022   CALCIUM 9.0 02/05/2022   ANIONGAP 8 10/13/2019   GFR 87.04 02/05/2022   Lab Results  Component Value Date   CHOL 200 02/05/2022   Lab Results  Component Value Date   HDL 53.60 02/05/2022   Lab Results  Component Value Date   LDLCALC 116 (H) 02/05/2022   Lab Results  Component Value Date   TRIG 155.0 (H) 02/05/2022   Lab Results  Component Value Date   CHOLHDL 4 02/05/2022   Lab Results  Component Value Date   HGBA1C 5.8 10/05/2021       Assessment & Plan:    Problem List Items Addressed This Visit     HTN (hypertension) - Primary (Chronic)    Well controlled, no changes to meds. Encouraged heart healthy diet such as the DASH diet and exercise as tolerated.        Osteopenia    Bone density shows osteopenia, which is thinner than normal but not as bad as osteoporosis. Recommend calcium intake of 1200 to 1500 mg daily, divided into roughly  3 doses. Best source is the diet and a single dairy serving is about 500 mg, a supplement of calcium citrate once or twice daily to balance diet is fine if not getting enough in diet. Also need Vitamin D 2000 IU caps, 1 cap daily if not already taking vitamin D. Also recommend weight baring exercise on hips and upper body to keep bones strong  consider repeating Dexa in 2024 or 2025      Vitamin D deficiency    Supplement and monitor       Vitamin B12 deficiency    Supplement and monitor       Diverticulosis    Had a recent diverticular flare but responded well to Augmentin and Flagyl, reminded to hydrate well and consider adding a fiber supplement. Report any concners      Hematuria    Notes trace hematuria in the distant pass but has reoccured several times recently without other new symptoms. She has been referred to urology for evaluation and is encouraged to proceed with consultation      No orders of the defined types were placed in this encounter.   I, Penni Homans, MD, personally preformed the services described in this documentation.  All medical record entries made by the scribe were at my direction and in my presence.  I have reviewed the chart and discharge instructions (if applicable) and agree that the record reflects my personal performance and is accurate and complete. 02/12/2022   I,Amber Collins,acting as a scribe for Penni Homans, MD.,have documented all relevant documentation on the behalf of Penni Homans, MD,as directed by  Penni Homans, MD while in the presence of Penni Homans,  MD.    Penni Homans, MD

## 2022-02-12 NOTE — Assessment & Plan Note (Signed)
Had a recent diverticular flare but responded well to Augmentin and Flagyl, reminded to hydrate well and consider adding a fiber supplement. Report any concners

## 2022-02-12 NOTE — Assessment & Plan Note (Signed)
Notes trace hematuria in the distant pass but has reoccured several times recently without other new symptoms. She has been referred to urology for evaluation and is encouraged to proceed with consultation

## 2022-02-19 ENCOUNTER — Other Ambulatory Visit (HOSPITAL_BASED_OUTPATIENT_CLINIC_OR_DEPARTMENT_OTHER): Payer: Self-pay

## 2022-02-21 ENCOUNTER — Ambulatory Visit (INDEPENDENT_AMBULATORY_CARE_PROVIDER_SITE_OTHER): Payer: Medicare Other | Admitting: *Deleted

## 2022-02-21 DIAGNOSIS — Z0000 Encounter for general adult medical examination without abnormal findings: Secondary | ICD-10-CM

## 2022-02-21 NOTE — Patient Instructions (Signed)
[name] , Thank you for taking time to come for your Medicare Wellness Visit. I appreciate your ongoing commitment to your health goals. Please review the following plan we discussed and let me know if I can assist you in the future.   These are the goals we discussed:  Goals      Patient Stated     Increase activity & decrease portion sizes        This is a list of the screening recommended for you and due dates:  Health Maintenance  Topic Date Due   COVID-19 Vaccine (7 - 2023-24 season) 04/02/2022   Medicare Annual Wellness Visit  02/22/2023   Mammogram  12/01/2023   Colon Cancer Screening  02/01/2029   Pneumonia Vaccine  Completed   Flu Shot  Completed   DEXA scan (bone density measurement)  Completed   Hepatitis C Screening: USPSTF Recommendation to screen - Ages 64-79 yo.  Completed   Zoster (Shingles) Vaccine  Completed   HPV Vaccine  Aged Out     Next appointment: Follow up in one year for your annual wellness visit    Preventive Care 65 Years and Older, Female Preventive care refers to lifestyle choices and visits with your health care provider that can promote health and wellness. What does preventive care include? A yearly physical exam. This is also called an annual well check. Dental exams once or twice a year. Routine eye exams. Ask your health care provider how often you should have your eyes checked. Personal lifestyle choices, including: Daily care of your teeth and gums. Regular physical activity. Eating a healthy diet. Avoiding tobacco and drug use. Limiting alcohol use. Practicing safe sex. Taking low-dose aspirin every day. Taking vitamin and mineral supplements as recommended by your health care provider. What happens during an annual well check? The services and screenings done by your health care provider during your annual well check will depend on your age, overall health, lifestyle risk factors, and family history of disease. Counseling   Your health care provider may ask you questions about your: Alcohol use. Tobacco use. Drug use. Emotional well-being. Home and relationship well-being. Sexual activity. Eating habits. History of falls. Memory and ability to understand (cognition). Work and work Statistician. Reproductive health. Screening  You may have the following tests or measurements: Height, weight, and BMI. Blood pressure. Lipid and cholesterol levels. These may be checked every 5 years, or more frequently if you are over 11 years old. Skin check. Lung cancer screening. You may have this screening every year starting at age 36 if you have a 30-pack-year history of smoking and currently smoke or have quit within the past 15 years. Fecal occult blood test (FOBT) of the stool. You may have this test every year starting at age 58. Flexible sigmoidoscopy or colonoscopy. You may have a sigmoidoscopy every 5 years or a colonoscopy every 10 years starting at age 44. Hepatitis C blood test. Hepatitis B blood test. Sexually transmitted disease (STD) testing. Diabetes screening. This is done by checking your blood sugar (glucose) after you have not eaten for a while (fasting). You may have this done every 1-3 years. Bone density scan. This is done to screen for osteoporosis. You may have this done starting at age 64. Mammogram. This may be done every 1-2 years. Talk to your health care provider about how often you should have regular mammograms. Talk with your health care provider about your test results, treatment options, and if necessary, the need  for more tests. Vaccines  Your health care provider may recommend certain vaccines, such as: Influenza vaccine. This is recommended every year. Tetanus, diphtheria, and acellular pertussis (Tdap, Td) vaccine. You may need a Td booster every 10 years. Zoster vaccine. You may need this after age 42. Pneumococcal 13-valent conjugate (PCV13) vaccine. One dose is recommended  after age 50. Pneumococcal polysaccharide (PPSV23) vaccine. One dose is recommended after age 22. Talk to your health care provider about which screenings and vaccines you need and how often you need them. This information is not intended to replace advice given to you by your health care provider. Make sure you discuss any questions you have with your health care provider. Document Released: 04/08/2015 Document Revised: 11/30/2015 Document Reviewed: 01/11/2015 Elsevier Interactive Patient Education  2017 [place] Prevention in the Home Falls can cause injuries. They can happen to people of all ages. There are many things you can do to make your home safe and to help prevent falls. What can I do on the outside of my home? Regularly fix the edges of walkways and driveways and fix any cracks. Remove anything that might make you trip as you walk through a door, such as a raised step or threshold. Trim any bushes or trees on the path to your home. Use bright outdoor lighting. Clear any walking paths of anything that might make someone trip, such as rocks or tools. Regularly check to see if handrails are loose or broken. Make sure that both sides of any steps have handrails. Any raised decks and porches should have guardrails on the edges. Have any leaves, snow, or ice cleared regularly. Use sand or salt on walking paths during winter. Clean up any spills in your garage right away. This includes oil or grease spills. What can I do in the bathroom? Use night lights. Install grab bars by the toilet and in the tub and shower. Do not use towel bars as grab bars. Use non-skid mats or decals in the tub or shower. If you need to sit down in the shower, use a plastic, non-slip stool. Keep the floor dry. Clean up any water that spills on the floor as soon as it happens. Remove soap buildup in the tub or shower regularly. Attach bath mats securely with double-sided non-slip rug tape. Do not  have throw rugs and other things on the floor that can make you trip. What can I do in the bedroom? Use night lights. Make sure that you have a light by your bed that is easy to reach. Do not use any sheets or blankets that are too big for your bed. They should not hang down onto the floor. Have a firm chair that has side arms. You can use this for support while you get dressed. Do not have throw rugs and other things on the floor that can make you trip. What can I do in the kitchen? Clean up any spills right away. Avoid walking on wet floors. Keep items that you use a lot in easy-to-reach places. If you need to reach something above you, use a strong step stool that has a grab bar. Keep electrical cords out of the way. Do not use floor polish or wax that makes floors slippery. If you must use wax, use non-skid floor wax. Do not have throw rugs and other things on the floor that can make you trip. What can I do with my stairs? Do not leave any items on the stairs.  Make sure that there are handrails on both sides of the stairs and use them. Fix handrails that are broken or loose. Make sure that handrails are as long as the stairways. Check any carpeting to make sure that it is firmly attached to the stairs. Fix any carpet that is loose or worn. Avoid having throw rugs at the top or bottom of the stairs. If you do have throw rugs, attach them to the floor with carpet tape. Make sure that you have a light switch at the top of the stairs and the bottom of the stairs. If you do not have them, ask someone to add them for you. What else can I do to help prevent falls? Wear shoes that: Do not have high heels. Have rubber bottoms. Are comfortable and fit you well. Are closed at the toe. Do not wear sandals. If you use a stepladder: Make sure that it is fully opened. Do not climb a closed stepladder. Make sure that both sides of the stepladder are locked into place. Ask someone to hold it for  you, if possible. Clearly mark and make sure that you can see: Any grab bars or handrails. First and last steps. Where the edge of each step is. Use tools that help you move around (mobility aids) if they are needed. These include: Canes. Walkers. Scooters. Crutches. Turn on the lights when you go into a dark area. Replace any light bulbs as soon as they burn out. Set up your furniture so you have a clear path. Avoid moving your furniture around. If any of your floors are uneven, fix them. If there are any pets around you, be aware of where they are. Review your medicines with your doctor. Some medicines can make you feel dizzy. This can increase your chance of falling. Ask your doctor what other things that you can do to help prevent falls. This information is not intended to replace advice given to you by your health care provider. Make sure you discuss any questions you have with your health care provider. Document Released: 01/06/2009 Document Revised: 08/18/2015 Document Reviewed: 04/16/2014 Elsevier Interactive Patient Education  2017 [employer].

## 2022-02-21 NOTE — Progress Notes (Signed)
Subjective:   Carrie Elliott is a 71 y.o. female who presents for Medicare Annual (Subsequent) preventive examination.  I connected with  Christean Grief Gasser on 02/21/22 by a audio enabled telemedicine application and verified that I am speaking with the correct person using two identifiers.  Patient Location: Home  Provider Location: Office/Clinic  I discussed the limitations of evaluation and management by telemedicine. The patient expressed understanding and agreed to proceed.   Review of Systems    Defer to PCP Cardiac Risk Factors include: advanced age (>10mn, >>70women);hypertension     Objective:    Today's Vitals   02/21/22 1402  PainSc: 4    There is no height or weight on file to calculate BMI.     02/21/2022    2:04 PM 05/08/2021    2:57 PM 02/22/2021    9:12 AM 02/21/2021    3:12 PM 10/14/2020    2:53 PM 10/13/2019   10:28 AM 07/23/2019    8:18 AM  Advanced Directives  Does Patient Have a Medical Advance Directive? Yes Yes Yes Yes No Yes Yes  Type of AParamedicof AMink[place]Living will  HW[place]aLiving will HHeeiaLiving will  [place]Living will H[place]andy ParkLiving will  Does patient want to make changes to medical advance directive? No - Patient declined No - Patient declined No - Patient declined   No - Patient declined   Copy of HC[place]in Chart? Yes - validated most recent copy scanned in chart (See row information)  Yes - validated most recent copy scanned in chart (See row information) Yes - validated most recent copy scanned in chart (See row information)     Would patient like information on creating a medical advance directive?     No - Patient declined      Current Medications (verified) Outpatient Encounter Medications as of 02/21/2022  Medication Sig   benzocaine (ORAJEL) 10 % mucosal gel Use as directed 1 application in the mouth  or throat as needed for mouth pain. Reported on 07/22/2015   Cholecalciferol (VITAMIN D3) 50 MCG (2000 UT) TABS Take by mouth. Take 1 tablet daily Monday-Friday. Take 2 tablets on Saturday.   COVID-19 mRNA vaccine 2023-2024 (COMIRNATY) syringe Inject into the muscle.   esomeprazole (NEXIUM) 40 MG capsule Take 1 capsule by mouth once a day   famotidine (PEPCID) 20 MG tablet Take 1 tablet by mouth twice a day as needed   fluticasone (FLONASE) 50 MCG/ACT nasal spray Place 2 sprays into both nostrils daily.   hydrochlorothiazide (MICROZIDE) 12.5 MG capsule TAKE 1 CAPSULE (12.5 MG TOTAL) BY MOUTH DAILY. TAKE IF > 110/70   Lidocaine-Hydrocort, Perianal, 3-0.5 % CREA Apply rectally as directed once a day if needed   lisinopril (ZESTRIL) 10 MG tablet Take 1 tablet (10 mg total) by mouth 2 (two) times daily.   metoprolol succinate (TOPROL-XL) 50 MG 24 hr tablet TAKE 1 - 2 TABLETS BY MOUTH TWO TIMES DAILY WITH OR IMMEDIATELY FOLLOWING A MEAL   ondansetron (ZOFRAN) 8 MG tablet Take 1 tablet (8 mg total) by mouth every 8 (eight) hours as needed for nausea or vomiting.   SIMPLY SALINE NA Place 1 each into the nose daily.    [DISCONTINUED] amoxicillin-clavulanate (AUGMENTIN) 875-125 MG tablet Take 1 tablet by mouth 2 (two) times daily. (Patient not taking: Reported on 02/12/2022)   [DISCONTINUED] esomeprazole (NEXIUM) 40 MG capsule TAKE 1 CAPSULE BY  MOUTH ONCE A DAY   [DISCONTINUED] famotidine (PEPCID) 20 MG tablet TAKE 1 TABLET BY MOUTH AT BEDTIME AS NEEDED TWICE A DAY   [DISCONTINUED] hydrochlorothiazide (MICROZIDE) 12.5 MG capsule TAKE 1 CAPSULE (12.5 MG TOTAL) BY MOUTH DAILY. TAKE IF > 110/70   [DISCONTINUED] Lidocaine-Hydrocortisone Ace 3-0.5 % CREA Apply 1 application topically daily as needed (hemmoriods.).    No facility-administered encounter medications on file as of 02/21/2022.    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   Past Surgical History:  Procedure Laterality Date   APPENDECTOMY  1989   atypical nevus excision     BREAST BIOPSY Right    CHOLECYSTECTOMY  1989   COLONOSCOPY WITH PROPOFOL N/A 05/27/2015   Procedure: COLONOSCOPY WITH PROPOFOL;  Surgeon: Clarene Essex, MD;  Location: [facility];  Service: Endoscopy;  Laterality: N/A;   CYSTOSCOPY W/ URETERAL STENT REMOVAL  1999   ESOPHAGOGASTRODUODENOSCOPY  2011   with esophageal stent, perforation    ESOPHAGOGASTRODUODENOSCOPY (EGD) WITH PROPOFOL N/A 05/27/2015   Procedure: ESOPHAGOGASTRODUODENOSCOPY (EGD) WITH PROPOFOL;  Surgeon: Clarene Essex, MD;  Location: [facility];  Service: Endoscopy;  Laterality: N/A;   HEMORRHOID SURGERY  2005   LEFT OOPHORECTOMY  6063   complicated by ureteral injuery requiring a ureteral stent    MMK / ANTERIOR VESICOURETHROPEXY / URETHROPEXY  2000   TONSILLECTOMY AND ADENOIDECTOMY     TOTAL ABDOMINAL HYSTERECTOMY  2000   with USO   TUBAL LIGATION  1981   wisdom teeth removal  1964   Family History  Problem Relation Age of Onset   Colon polyps Father    Hypertension Father    Heart failure Father    Melanoma Father        multiple   Heart attack Father        x2   Heart disease Father        Bundle Block   Meniere's disease Father    Cancer Father        melanoma   Osteoporosis Mother    Transient ischemic attack Mother    Cancer Mother         colon   Heart disease Mother        afib   Hyperlipidemia Mother    Diabetes Paternal Grandmother    Stroke Maternal Grandmother    Cancer Maternal Grandmother        skin, melanoma, sarcoma   Colon polyps Maternal Grandmother    Diverticulitis Maternal Grandmother    Stroke Maternal Grandfather    Heart disease Maternal Grandfather    Obesity Brother    Social History   Socioeconomic History   Marital status: Married    Spouse name: Not on file   Number of children: Not on file   Years of education: Not on file   Highest education  level: Not on file  Occupational History   Not on file  Tobacco Use   Smoking status: Never   Smokeless tobacco: Never  Vaping Use   Vaping Use: Never used  Substance and Sexual Activity   Alcohol use: No   Drug use: No   Sexual activity: Yes    Birth control/protection: Surgical    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, avoids spicy  Other Topics Concern   Not on file  Social History Narrative   Not on file   Social Determinants of Health   Financial Resource Strain: Low Risk  (02/21/2021)   Overall Financial Resource Strain (CARDIA)    Difficulty of Paying Living Expenses: Not hard at all  Food Insecurity: No Food Insecurity (02/21/2022)   Hunger Vital Sign    Worried About Running Out of Food in the Last Year: Never true    Ran Out of Food in the Last Year: Never true  Transportation Needs: No Transportation Needs (02/21/2022)   PRAPARE - Hydrologist (Medical): No    Lack of Transportation (Non-Medical): No  Physical Activity: Inactive (02/21/2021)   Exercise Vital Sign    Days of Exercise per Week: 0 days    Minutes of Exercise per Session: 0 min  Stress: No Stress Concern Present (02/21/2021)   [place]    Feeling of Stress : Not at all  Social Connections: [place] (02/21/2021)   Social  Connection and Isolation Panel [NHANES]    Frequency of Communication with Friends and Family: More than three times a week    Frequency of Social Gatherings with Friends and Family: More than three times a week    Attends Religious Services: More than 4 times per year    Active Member of [employer] or Organizations: Yes    Attends Music therapist: More than 4 times per year    Marital Status: Married    Tobacco Counseling Counseling given: Not Answered   Clinical Intake:  Pre-visit preparation completed: Yes  Pain : 0-10 Pain Score: 4  Pain Location: Abdomen Pain Descriptors / Indicators: Aching   Diabetes: No  How often do you need to have someone help you when you read instructions, pamphlets, or other written materials from your doctor or pharmacy?: 1 - Never  Activities of Daily Living    02/21/2022    2:05 PM  In your present state of health, do you have any difficulty performing the following activities:  Hearing? 0  Vision? 0  Difficulty concentrating or making decisions? 0  Walking or climbing stairs? 0  Dressing or bathing? 0  Doing errands, shopping? 0  Preparing Food and eating ? N  Using the Toilet? N  In the past six months, have you accidently leaked urine? N  Do you have problems with loss of bowel control? N  Managing your Medications? N  Managing your Finances? N  Housekeeping or managing your Housekeeping? N    Patient Care Team: Mosie Lukes, MD as PCP - General (Family Medicine) Clarene Essex, MD as Consulting Physician (Gastroenterology)  Indicate any recent Medical Services you may have received from other than [facility] providers in the past year (date may be approximate).     Assessment:   This is a routine wellness examination for [place].  Hearing/Vision screen No results found.  Dietary issues and exercise activities discussed: Current Exercise Habits: The patient does not participate in  regular exercise at present, Exercise  limited by: None identified   Goals Addressed   None    Depression Screen    02/21/2022    2:01 PM 02/12/2022    9:27 AM 10/12/2021   11:20 AM 02/23/2021   10:58 AM 02/21/2021    3:18 PM 01/19/2021    9:18 AM 05/11/2019    2:22 PM  PHQ 2/9 Scores  PHQ - 2 Score 0 0 0 0 0 0 0    Fall Risk    02/21/2022    2:04 PM 02/12/2022    9:27 AM 10/12/2021   11:20 AM 02/21/2021    3:14 PM 10/13/2020   11:24 AM  Fall Risk   Falls in the past year? 0 0 0 1 0  Number falls in past yr: 0 0 0 1   Injury with Fall? 0 0 0 1   Risk for fall due to : No Fall Risks  History of fall(s) History of fall(s)   Follow up Falls evaluation completed Falls evaluation completed Falls evaluation completed Falls prevention discussed     FALL RISK PREVENTION PERTAINING TO THE HOME:  Any stairs in or around the home? Yes  If so, are there any without handrails? No  Home free of loose throw rugs in walkways, pet beds, electrical cords, etc? Yes  Adequate lighting in your home to reduce risk of falls? Yes   ASSISTIVE DEVICES UTILIZED TO PREVENT FALLS:  Life alert? No  Use of a cane, walker or w/c? No  Grab bars in the bathroom? Yes  Shower chair or bench in shower? Yes  Elevated toilet seat or a handicapped toilet? Yes   TIMED UP AND GO:  Was the test performed?  No, audio visit .    Cognitive Function:        02/21/2022    2:16 PM  6CIT Screen  What Year? 0 points  What month? 0 points  What time? 0 points  Count back from 20 0 points  Months in reverse 0 points  Repeat phrase 0 points  Total Score 0 points    Immunizations Immunization History  Administered Date(s) Administered   COVID-19, mRNA, vaccine(Comirnaty)12 years and older 02/05/2022   Fluad Quad(high Dose 65+) 12/29/2021   Hepatitis A 01/21/2004, 10/12/2004   Hepatitis B 01/14/2004, 03/01/2004, 10/12/2004   Influenza Split 12/28/2014, 12/31/2019, 01/10/2021   Influenza Whole 12/27/2010   Influenza, High Dose Seasonal  PF 12/19/2015, 01/07/2017, 01/05/2019   Influenza-Unspecified 12/25/2011, 12/09/2012, 01/11/2014   PFIZER Comirnaty(Gray Top)Covid-19 Tri-Sucrose Vaccine 07/22/2020   PFIZER(Purple Top)SARS-COV-2 Vaccination 04/04/2019, 04/24/2019, 01/23/2020, 02/05/2022   Pfizer Covid-19 Vaccine Bivalent Booster 55yr & up 01/25/2021   Pneumococcal Conjugate-13 09/30/2015   Pneumococcal Polysaccharide-23 11/23/2016   Td 05/24/1997, 07/11/2007   Tdap 11/15/2017, 10/14/2020   Zoster Recombinat (Shingrix) 03/01/2017, 07/26/2017   Zoster, Live 12/26/2012    TDAP status: Up to date  Flu Vaccine status: Up to date  Pneumococcal vaccine status: Up to date  Covid-19 vaccine status: Information provided on how to obtain vaccines.   Qualifies for Shingles Vaccine? Yes   Zostavax completed Yes   Shingrix Completed?: Yes  Screening Tests Health Maintenance  Topic Date Due   Medicare Annual Wellness (AWV)  02/21/2022   COVID-19 Vaccine (7 - 2023-24 season) 04/02/2022   MAMMOGRAM  12/01/2023   COLONOSCOPY (Pts 45-42yrInsurance coverage will need to be confirmed)  02/01/2029   Pneumonia Vaccine 6512Years old  Completed   INFLUENZA VACCINE  Completed  DEXA SCAN  Completed   Hepatitis C Screening  Completed   Zoster Vaccines- Shingrix  Completed   HPV VACCINES  Aged Out    Health Maintenance  Health Maintenance Due  Topic Date Due   Medicare Annual Wellness (AWV)  02/21/2022    Colorectal cancer screening: Type of screening: Colonoscopy. Completed 02/02/19. Repeat every 10 years  Mammogram status: Completed 11/30/21. Repeat every year  Bone Density status: Completed 11/24/20. Results reflect: Bone density results: OSTEOPENIA. Repeat every 2 years.  Lung Cancer Screening: (Low Dose CT Chest recommended if Age 17-80 years, 30 pack-year currently smoking OR have quit w/in 15years.) does not qualify.    Additional Screening:  Hepatitis C Screening: does qualify; Completed 04/06/16  Vision  Screening: Recommended annual ophthalmology exams for early detection of glaucoma and other disorders of the eye. Is the patient up to date with their annual eye exam?  Yes  Who is the provider or what is the name of the office in which the patient attends annual eye exams? [facility] If pt is not established with a provider, would they like to be referred to a provider to establish care? No .   Dental Screening: Recommended annual dental exams for proper oral hygiene  Community Resource Referral / Chronic Care Management: CRR required this visit?  No   CCM required this visit?  No      Plan:     I have personally reviewed and noted the following in the patient's chart:   Medical and social history Use of alcohol, tobacco or illicit drugs  Current medications and supplements including opioid prescriptions. Patient is not currently taking opioid prescriptions. Functional ability and status Nutritional status Physical activity Advanced directives List of other physicians Hospitalizations, surgeries, and ER visits in previous 12 months Vitals Screenings to include cognitive, depression, and falls Referrals and appointments  In addition, I have reviewed and discussed with patient certain preventive protocols, quality metrics, and best practice recommendations. A written personalized care plan for preventive services as well as general preventive health recommendations were provided to patient.   Due to this being a telephonic visit, the after visit summary with patients personalized plan was offered to patient via mail or my-chart. Patient would like to access on my-chart.  Beatris Ship, [place]   02/21/2022   Nurse Notes: None

## 2022-02-22 ENCOUNTER — Other Ambulatory Visit (HOSPITAL_BASED_OUTPATIENT_CLINIC_OR_DEPARTMENT_OTHER): Payer: Self-pay

## 2022-02-22 DIAGNOSIS — K5792 Diverticulitis of intestine, part unspecified, without perforation or abscess without bleeding: Secondary | ICD-10-CM | POA: Diagnosis not present

## 2022-02-22 MED ORDER — AMOXICILLIN-POT CLAVULANATE 875-125 MG PO TABS
1.0000 | ORAL_TABLET | Freq: Two times a day (BID) | ORAL | 0 refills | Status: DC
  Filled 2022-02-22: qty 14, 7d supply, fill #0

## 2022-03-07 ENCOUNTER — Other Ambulatory Visit (HOSPITAL_BASED_OUTPATIENT_CLINIC_OR_DEPARTMENT_OTHER): Payer: Self-pay

## 2022-03-07 ENCOUNTER — Other Ambulatory Visit: Payer: Self-pay | Admitting: Family Medicine

## 2022-03-07 MED ORDER — METOPROLOL SUCCINATE ER 50 MG PO TB24
50.0000 mg | ORAL_TABLET | Freq: Two times a day (BID) | ORAL | 1 refills | Status: DC
  Filled 2022-03-07: qty 360, 90d supply, fill #0
  Filled 2022-09-24: qty 360, 90d supply, fill #1

## 2022-03-08 ENCOUNTER — Other Ambulatory Visit (INDEPENDENT_AMBULATORY_CARE_PROVIDER_SITE_OTHER): Payer: Medicare Other

## 2022-03-08 ENCOUNTER — Encounter: Payer: Self-pay | Admitting: Urology

## 2022-03-08 ENCOUNTER — Encounter: Payer: Self-pay | Admitting: Family

## 2022-03-08 ENCOUNTER — Ambulatory Visit (INDEPENDENT_AMBULATORY_CARE_PROVIDER_SITE_OTHER): Payer: Medicare Other | Admitting: Urology

## 2022-03-08 VITALS — BP 153/82 | HR 71 | Ht 61.0 in | Wt 170.0 lb

## 2022-03-08 DIAGNOSIS — N281 Cyst of kidney, acquired: Secondary | ICD-10-CM | POA: Diagnosis not present

## 2022-03-08 DIAGNOSIS — R319 Hematuria, unspecified: Secondary | ICD-10-CM

## 2022-03-08 DIAGNOSIS — I10 Essential (primary) hypertension: Secondary | ICD-10-CM

## 2022-03-08 DIAGNOSIS — E785 Hyperlipidemia, unspecified: Secondary | ICD-10-CM | POA: Diagnosis not present

## 2022-03-08 DIAGNOSIS — R195 Other fecal abnormalities: Secondary | ICD-10-CM

## 2022-03-08 LAB — CBC WITH DIFFERENTIAL/PLATELET
Basophils Absolute: 0 10*3/uL (ref 0.0–0.1)
Basophils Relative: 0.8 % (ref 0.0–3.0)
Eosinophils Absolute: 0.1 10*3/uL (ref 0.0–0.7)
Eosinophils Relative: 1.4 % (ref 0.0–5.0)
HCT: 38.2 % (ref 36.0–46.0)
Hemoglobin: 12.9 g/dL (ref 12.0–15.0)
Lymphocytes Relative: 29.7 % (ref 12.0–46.0)
Lymphs Abs: 1.7 10*3/uL (ref 0.7–4.0)
MCHC: 33.8 g/dL (ref 30.0–36.0)
MCV: 88.6 fl (ref 78.0–100.0)
Monocytes Absolute: 0.5 10*3/uL (ref 0.1–1.0)
Monocytes Relative: 8.3 % (ref 3.0–12.0)
Neutro Abs: 3.5 10*3/uL (ref 1.4–7.7)
Neutrophils Relative %: 59.8 % (ref 43.0–77.0)
Platelets: 278 10*3/uL (ref 150.0–400.0)
RBC: 4.31 Mil/uL (ref 3.87–5.11)
RDW: 13.3 % (ref 11.5–15.5)
WBC: 5.9 10*3/uL (ref 4.0–10.5)

## 2022-03-08 LAB — URINALYSIS
Bilirubin, UA: NEGATIVE
Blood, UA: POSITIVE
Glucose, UA: NEGATIVE mg/dL
Ketones, UA: NEGATIVE
Leukocytes, UA: NEGATIVE
Nitrite, UA: NEGATIVE
Protein, UA: NEGATIVE
Spec Grav, UA: 1.01 (ref 1.010–1.025)
Urobilinogen, UA: 0.2 E.U./dL
pH, UA: 5.5 (ref 5.0–8.0)

## 2022-03-08 LAB — LDL CHOLESTEROL, DIRECT: Direct LDL: 108 mg/dL

## 2022-03-08 NOTE — Progress Notes (Signed)
Assessment: 1. Hematuria - on dipstick   2. Bilateral renal cysts     Plan: I personally reviewed the patient's chart including provider notes, lab results, and imaging results. I personally viewed the CT study from 01/25/2022 with results as noted below. Today I had a discussion with the patient regarding the findings of hematuria including the implications and differential diagnoses associated with it.  I also discussed recommendations for further evaluation including the rationale for upper tract imaging and cystoscopy.  I discussed the nature of these procedures including potential risk and complications.  The patient expressed an understanding of these issues. Microscopic U/A today - will call with results   Chief Complaint  Patient presents with   Hematuria    History of Present Illness:  Carrie Elliott is a 71 y.o. female who is seen in consultation from Mosie Lukes, MD for evaluation of hematuria. She was found to have blood on a dipstick urinalysis in October 2023.  Dipstick urinalysis from 01/23/2022 showed trace blood.  Urine culture showed no growth.  Repeat urinalysis from 02/05/2022 showed small blood.  Urine culture grew <10K colonies. No gross hematuria.  No flank pain.  CT abdomen and pelvis with contrast from 01/25/2022 showed no renal or ureteral calculi, no hydronephrosis, bilateral renal cysts, and diverticulitis in the sigmoid colon. Treated with antibiotics.  Her left lower abdominal pain has improved.  She has a history of a cystocele.  She underwent a hysterectomy and MMK procedure a number of years ago.  She has noted a recurrent bulge in the vaginal area.  This is previously been evaluated by urogynecology.  She has nocturia 1-3 times and rare urinary incontinence associated with coughing and sneezing.  No history of UTIs.  She has occasional discomfort associated with the vaginal bulge.   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    Past Surgical History:  Past Surgical History:  Procedure Laterality Date   APPENDECTOMY  1989   atypical nevus excision     BREAST BIOPSY Right    CHOLECYSTECTOMY  1989   COLONOSCOPY WITH PROPOFOL N/A 05/27/2015   Procedure: COLONOSCOPY WITH PROPOFOL;  Surgeon: Clarene Essex, MD;  Location: [facility];  Service: Endoscopy;  Laterality: N/A;   CYSTOSCOPY W/ URETERAL STENT REMOVAL  1999   ESOPHAGOGASTRODUODENOSCOPY  2011   with esophageal stent, perforation    ESOPHAGOGASTRODUODENOSCOPY (EGD) WITH PROPOFOL N/A 05/27/2015   Procedure: ESOPHAGOGASTRODUODENOSCOPY (EGD) WITH PROPOFOL;  Surgeon: Clarene Essex, MD;  Location: [facility];  Service: Endoscopy;  Laterality: N/A;   HEMORRHOID SURGERY  2005   LEFT OOPHORECTOMY  9798   complicated by ureteral injuery requiring a ureteral stent    MMK / ANTERIOR VESICOURETHROPEXY / URETHROPEXY  2000   TONSILLECTOMY AND ADENOIDECTOMY     TOTAL ABDOMINAL HYSTERECTOMY  2000   with USO   TUBAL LIGATION  1981   wisdom teeth removal  1964    Allergies:  Allergies  Allergen Reactions   Pseudoephedrine Hcl Er Other (See Comments)    TACHYCARDIA    Singulair [Montelukast Sodium] Other (See Comments)    Mental status changes, irritable   Cefdinir Hives   Celebrex [Celecoxib] Other (See Comments)    ABDOMINAL PAIN    Ciprofloxacin Other (See Comments)    ABDOMINAL PAIN    Codeine Nausea And Vomiting    NAUSEA VOMITING - can take with zofran    Family  History:  Family History  Problem Relation Age of Onset   Colon polyps Father    Hypertension Father    Heart failure Father    Melanoma Father        multiple   Heart attack Father        x2   Heart disease Father        Bundle Block   Meniere's disease Father    Cancer Father        melanoma   Osteoporosis Mother    Transient ischemic attack Mother    Cancer Mother        colon   Heart disease Mother        afib   Hyperlipidemia Mother    Diabetes Paternal Grandmother    Stroke Maternal Grandmother    Cancer Maternal Grandmother        skin, melanoma, sarcoma   Colon polyps Maternal Grandmother    Diverticulitis Maternal Grandmother    Stroke Maternal Grandfather    Heart disease Maternal Grandfather    Obesity Brother     Social History:  Social History   Tobacco Use   Smoking status: Never   Smokeless tobacco: Never  Vaping Use   Vaping Use: Never used  Substance Use Topics   Alcohol use: No   Drug use: No    Review of symptoms:  Constitutional:  Negative for unexplained weight loss, night sweats, fever, chills ENT:  Negative for nose bleeds, sinus pain, painful swallowing CV:  Negative for chest pain, shortness of breath, exercise intolerance, palpitations, loss of consciousness Resp:  Negative for cough, wheezing, shortness of breath GI:  Negative for nausea, vomiting, diarrhea, bloody stools GU:  Positives noted in HPI; otherwise negative for gross hematuria, dysuria Neuro:  Negative for seizures, poor balance, limb weakness, slurred speech Psych:  Negative for lack of energy, depression, anxiety Endocrine:  Negative for polydipsia, polyuria, symptoms of hypoglycemia (dizziness, hunger, sweating) Hematologic:  Negative for anemia, purpura, petechia, prolonged or excessive bleeding, use of anticoagulants  Allergic:  Negative for difficulty breathing or choking as a result of exposure to anything; no shellfish allergy; no allergic response (rash/itch) to  materials, foods  Physical exam: BP (!) 153/82   Pulse 71   Ht '5\' 1"'$  (1.549 m)   Wt 170 lb (77.1 kg)   BMI 32.12 kg/m  GENERAL APPEARANCE:  Well appearing, well developed, well nourished, NAD HEENT: Atraumatic, Normocephalic, oropharynx clear. NECK: Supple without lymphadenopathy or thyromegaly. LUNGS: Clear to auscultation bilaterally. HEART: Regular Rate and Rhythm without murmurs, gallops, or rubs. ABDOMEN: Soft, non-tender, No Masses. EXTREMITIES: Moves all extremities well.  Without clubbing, cyanosis, or edema. NEUROLOGIC:  Alert and oriented x 3, normal gait, CN II-XII grossly intact.  MENTAL STATUS:  Appropriate. BACK:  Non-tender to palpation.  No CVAT SKIN:  Warm, dry and intact.    Results: U/A dipstick: 1+ blood

## 2022-03-09 LAB — URINALYSIS, MICROSCOPIC ONLY
Bacteria, UA: NONE SEEN
Casts: NONE SEEN /lpf
Epithelial Cells (non renal): NONE SEEN /hpf (ref 0–10)
RBC, Urine: NONE SEEN /hpf (ref 0–2)
WBC, UA: NONE SEEN /hpf (ref 0–5)

## 2022-03-10 ENCOUNTER — Encounter: Payer: Self-pay | Admitting: Family

## 2022-03-10 ENCOUNTER — Encounter: Payer: Self-pay | Admitting: Urology

## 2022-03-12 ENCOUNTER — Telehealth: Payer: Self-pay

## 2022-03-12 NOTE — Telephone Encounter (Signed)
LMOM asking pt to return call. Sent pt mychart msg notifying her of msg and asked her to call office to make f/u appt.

## 2022-03-12 NOTE — Telephone Encounter (Signed)
-----   Message from Primus Bravo, MD sent at 03/10/2022  7:43 PM EST ----- Please notify patient that her microscopic urinalysis did not show evidence of blood. Recommend a f/u appt in 1 month for a repeat U/A.

## 2022-03-21 ENCOUNTER — Other Ambulatory Visit (HOSPITAL_BASED_OUTPATIENT_CLINIC_OR_DEPARTMENT_OTHER): Payer: Self-pay

## 2022-04-06 ENCOUNTER — Other Ambulatory Visit (HOSPITAL_BASED_OUTPATIENT_CLINIC_OR_DEPARTMENT_OTHER): Payer: Self-pay

## 2022-04-06 ENCOUNTER — Encounter: Payer: Self-pay | Admitting: Family

## 2022-04-06 ENCOUNTER — Encounter: Payer: Self-pay | Admitting: Family Medicine

## 2022-04-06 DIAGNOSIS — Z2911 Encounter for prophylactic immunotherapy for respiratory syncytial virus (RSV): Secondary | ICD-10-CM

## 2022-04-06 NOTE — Telephone Encounter (Signed)
Called pt was advised it was sent to pharmacy  [facility] was sent

## 2022-04-10 ENCOUNTER — Ambulatory Visit (INDEPENDENT_AMBULATORY_CARE_PROVIDER_SITE_OTHER): Payer: Medicare Other | Admitting: Urology

## 2022-04-10 ENCOUNTER — Encounter: Payer: Self-pay | Admitting: Urology

## 2022-04-10 ENCOUNTER — Other Ambulatory Visit (HOSPITAL_BASED_OUTPATIENT_CLINIC_OR_DEPARTMENT_OTHER): Payer: Self-pay

## 2022-04-10 VITALS — BP 114/76 | HR 76 | Ht 61.0 in | Wt 175.0 lb

## 2022-04-10 DIAGNOSIS — R319 Hematuria, unspecified: Secondary | ICD-10-CM | POA: Diagnosis not present

## 2022-04-10 DIAGNOSIS — N281 Cyst of kidney, acquired: Secondary | ICD-10-CM

## 2022-04-10 LAB — URINALYSIS
Bilirubin, UA: NEGATIVE
Glucose, UA: NEGATIVE mg/dL
Ketones, POC UA: NEGATIVE mg/dL
Leukocytes, UA: NEGATIVE
Nitrite, UA: NEGATIVE
Protein Ur, POC: NEGATIVE mg/dL
Spec Grav, UA: 1.01 (ref 1.010–1.025)
Urobilinogen, UA: 0.2 E.U./dL
pH, UA: 6 (ref 5.0–8.0)

## 2022-04-10 MED ORDER — AREXVY 120 MCG/0.5ML IM SUSR
INTRAMUSCULAR | 0 refills | Status: DC
  Filled 2022-04-10: qty 1, 1d supply, fill #0

## 2022-04-10 NOTE — Progress Notes (Signed)
Assessment: 1. Hematuria - on dipstick   2. Bilateral renal cysts     Plan: Microscopic U/A today - will call with results and to arrange follow-up.   Chief Complaint:   Chief Complaint  Patient presents with   Hematuria   History of Present Illness:  Carrie Elliott is a 72 y.o. female who is seen for further evaluation of hematuria. She was found to have blood on a dipstick urinalysis in October 2023.  Dipstick urinalysis from 01/23/2022 showed trace blood.  Urine culture showed no growth.  Repeat urinalysis from 02/05/2022 showed small blood.  Urine culture grew <10K colonies. No gross hematuria.  No flank pain. U/A 03/08/22: 0-2 RBC   CT abdomen and pelvis with contrast from 01/25/2022 showed no renal or ureteral calculi, no hydronephrosis, bilateral renal cysts, and diverticulitis in the sigmoid colon. Treated with antibiotics.  Her left lower abdominal pain improved.  She has a history of a cystocele.  She underwent a hysterectomy and MMK procedure a number of years ago.  She has noted a recurrent bulge in the vaginal area.  This is previously been evaluated by urogynecology.  She has nocturia 1-3 times and rare urinary incontinence associated with coughing and sneezing.  No history of UTIs.  She has occasional discomfort associated with the vaginal bulge.  Previously underwent hematuria evaluation by Dr. Estill Dooms in 2007.  No abnormalities were found on cystoscopy.  Urine cytology was negative.  She returns today for follow-up.  No change in urinary symptoms.  No dysuria or gross hematuria.  No flank pain.  Portions of the above documentation were copied from a prior visit for review purposes only.  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    Past Surgical History:  Past Surgical History:  Procedure Laterality Date   APPENDECTOMY  1989   atypical nevus excision     BREAST BIOPSY Right    CHOLECYSTECTOMY  1989   COLONOSCOPY WITH PROPOFOL N/A 05/27/2015   Procedure: COLONOSCOPY WITH PROPOFOL;  Surgeon: Clarene Essex, MD;  Location: [facility];  Service: Endoscopy;  Laterality: N/A;   CYSTOSCOPY W/ URETERAL STENT REMOVAL  1999   ESOPHAGOGASTRODUODENOSCOPY  2011   with esophageal stent, perforation    ESOPHAGOGASTRODUODENOSCOPY (EGD) WITH PROPOFOL N/A 05/27/2015   Procedure: ESOPHAGOGASTRODUODENOSCOPY (EGD) WITH PROPOFOL;  Surgeon: Clarene Essex, MD;  Location: [facility];  Service: Endoscopy;  Laterality: N/A;   HEMORRHOID SURGERY  2005   LEFT OOPHORECTOMY  2671   complicated by ureteral injuery requiring a ureteral stent    MMK / ANTERIOR VESICOURETHROPEXY / URETHROPEXY  2000   TONSILLECTOMY AND ADENOIDECTOMY     TOTAL ABDOMINAL HYSTERECTOMY  2000   with USO   TUBAL LIGATION  1981   wisdom teeth removal  1964    Allergies:  Allergies  Allergen Reactions   Pseudoephedrine Hcl Er Other (See Comments)    TACHYCARDIA  Singulair [Montelukast Sodium] Other (See Comments)    Mental status changes, irritable   Cefdinir Hives   Celebrex [Celecoxib] Other (See Comments)    ABDOMINAL PAIN    Ciprofloxacin Other (See Comments)    ABDOMINAL PAIN    Codeine Nausea And Vomiting    NAUSEA VOMITING - can take with zofran    Family History:  Family History  Problem Relation Age of Onset   Colon polyps Father    Hypertension Father    Heart failure Father    Melanoma Father        multiple   Heart attack Father        x2   Heart disease  Father        Bundle Block   Meniere's disease Father    Cancer Father        melanoma   Osteoporosis Mother    Transient ischemic attack Mother    Cancer Mother        colon   Heart disease Mother        afib   Hyperlipidemia Mother    Diabetes Paternal Grandmother    Stroke Maternal Grandmother    Cancer Maternal Grandmother        skin, melanoma, sarcoma   Colon polyps Maternal Grandmother    Diverticulitis Maternal Grandmother    Stroke Maternal Grandfather    Heart disease Maternal Grandfather    Obesity Brother     Social History:  Social History   Tobacco Use   Smoking status: Never   Smokeless tobacco: Never  Vaping Use   Vaping Use: Never used  Substance Use Topics   Alcohol use: No   Drug use: No    ROS: Constitutional:  Negative for fever, chills, weight loss CV: Negative for chest pain, previous MI, hypertension Respiratory:  Negative for shortness of breath, wheezing, sleep apnea, frequent cough GI:  Negative for nausea, vomiting, bloody stool, GERD  Physical exam: BP 114/76   Pulse 76   Ht '5\' 1"'$  (1.549 m)   Wt 175 lb (79.4 kg)   BMI 33.07 kg/m  GENERAL APPEARANCE:  Well appearing, well developed, well nourished, NAD HEENT:  Atraumatic, normocephalic, oropharynx clear NECK:  Supple without lymphadenopathy or thyromegaly ABDOMEN:  Soft, non-tender, no masses EXTREMITIES:  Moves all extremities well, without clubbing, cyanosis, or edema NEUROLOGIC:  Alert and oriented x 3, normal gait, CN II-XII grossly intact MENTAL STATUS:  appropriate BACK:  Non-tender to palpation, No CVAT SKIN:  Warm, dry, and intact  Results: U/A dipstick: 1+ blood

## 2022-04-11 ENCOUNTER — Encounter: Payer: Self-pay | Admitting: Urology

## 2022-04-11 LAB — URINALYSIS, MICROSCOPIC ONLY
Bacteria, UA: NONE SEEN
Casts: NONE SEEN /lpf
RBC, Urine: NONE SEEN /hpf (ref 0–2)

## 2022-05-04 ENCOUNTER — Encounter: Payer: Self-pay | Admitting: Family

## 2022-05-04 ENCOUNTER — Other Ambulatory Visit (HOSPITAL_BASED_OUTPATIENT_CLINIC_OR_DEPARTMENT_OTHER): Payer: Self-pay

## 2022-05-11 ENCOUNTER — Other Ambulatory Visit (HOSPITAL_BASED_OUTPATIENT_CLINIC_OR_DEPARTMENT_OTHER): Payer: Self-pay

## 2022-05-15 ENCOUNTER — Ambulatory Visit: Payer: Medicare Other | Admitting: Family Medicine

## 2022-06-06 DIAGNOSIS — L820 Inflamed seborrheic keratosis: Secondary | ICD-10-CM | POA: Diagnosis not present

## 2022-06-26 DIAGNOSIS — L814 Other melanin hyperpigmentation: Secondary | ICD-10-CM | POA: Diagnosis not present

## 2022-06-26 DIAGNOSIS — D225 Melanocytic nevi of trunk: Secondary | ICD-10-CM | POA: Diagnosis not present

## 2022-06-26 DIAGNOSIS — D2261 Melanocytic nevi of right upper limb, including shoulder: Secondary | ICD-10-CM | POA: Diagnosis not present

## 2022-06-26 DIAGNOSIS — L821 Other seborrheic keratosis: Secondary | ICD-10-CM | POA: Diagnosis not present

## 2022-06-26 DIAGNOSIS — L820 Inflamed seborrheic keratosis: Secondary | ICD-10-CM | POA: Diagnosis not present

## 2022-07-04 ENCOUNTER — Other Ambulatory Visit (HOSPITAL_BASED_OUTPATIENT_CLINIC_OR_DEPARTMENT_OTHER): Payer: Self-pay

## 2022-07-04 ENCOUNTER — Other Ambulatory Visit: Payer: Self-pay | Admitting: Family Medicine

## 2022-07-04 MED ORDER — FLUTICASONE PROPIONATE 50 MCG/ACT NA SUSP
2.0000 | Freq: Every day | NASAL | 6 refills | Status: DC
  Filled 2022-07-04: qty 16, 30d supply, fill #0
  Filled 2022-09-24: qty 16, 30d supply, fill #1
  Filled 2022-11-05: qty 16, 30d supply, fill #2
  Filled 2023-01-09: qty 16, 30d supply, fill #3
  Filled 2023-02-05: qty 16, 30d supply, fill #4

## 2022-07-04 MED ORDER — ESOMEPRAZOLE MAGNESIUM 40 MG PO CPDR
40.0000 mg | DELAYED_RELEASE_CAPSULE | Freq: Every day | ORAL | 3 refills | Status: DC
  Filled 2022-07-04: qty 90, 90d supply, fill #0
  Filled 2022-10-08: qty 90, 90d supply, fill #1
  Filled 2023-01-09: qty 90, 90d supply, fill #2
  Filled 2023-04-08: qty 90, 90d supply, fill #3
  Filled ????-??-??: fill #3

## 2022-07-25 ENCOUNTER — Other Ambulatory Visit: Payer: Self-pay | Admitting: Family Medicine

## 2022-07-25 ENCOUNTER — Other Ambulatory Visit (HOSPITAL_BASED_OUTPATIENT_CLINIC_OR_DEPARTMENT_OTHER): Payer: Self-pay

## 2022-07-25 MED ORDER — LISINOPRIL 10 MG PO TABS
10.0000 mg | ORAL_TABLET | Freq: Two times a day (BID) | ORAL | 1 refills | Status: DC
  Filled 2022-07-25: qty 180, 90d supply, fill #0
  Filled 2022-11-05: qty 180, 90d supply, fill #1

## 2022-07-26 ENCOUNTER — Other Ambulatory Visit (HOSPITAL_BASED_OUTPATIENT_CLINIC_OR_DEPARTMENT_OTHER): Payer: Self-pay

## 2022-08-06 ENCOUNTER — Other Ambulatory Visit (INDEPENDENT_AMBULATORY_CARE_PROVIDER_SITE_OTHER): Payer: Medicare Other

## 2022-08-06 ENCOUNTER — Telehealth: Payer: Self-pay | Admitting: *Deleted

## 2022-08-06 ENCOUNTER — Other Ambulatory Visit (HOSPITAL_BASED_OUTPATIENT_CLINIC_OR_DEPARTMENT_OTHER): Payer: Self-pay

## 2022-08-06 DIAGNOSIS — E785 Hyperlipidemia, unspecified: Secondary | ICD-10-CM

## 2022-08-06 DIAGNOSIS — I10 Essential (primary) hypertension: Secondary | ICD-10-CM

## 2022-08-06 LAB — CBC WITH DIFFERENTIAL/PLATELET
Basophils Absolute: 0.1 10*3/uL (ref 0.0–0.1)
Basophils Relative: 1.2 % (ref 0.0–3.0)
Eosinophils Absolute: 0.2 10*3/uL (ref 0.0–0.7)
Eosinophils Relative: 4.1 % (ref 0.0–5.0)
HCT: 37.5 % (ref 36.0–46.0)
Hemoglobin: 12.4 g/dL (ref 12.0–15.0)
Lymphocytes Relative: 32 % (ref 12.0–46.0)
Lymphs Abs: 1.6 10*3/uL (ref 0.7–4.0)
MCHC: 33.1 g/dL (ref 30.0–36.0)
MCV: 91 fl (ref 78.0–100.0)
Monocytes Absolute: 0.5 10*3/uL (ref 0.1–1.0)
Monocytes Relative: 10.5 % (ref 3.0–12.0)
Neutro Abs: 2.6 10*3/uL (ref 1.4–7.7)
Neutrophils Relative %: 52.2 % (ref 43.0–77.0)
Platelets: 259 10*3/uL (ref 150.0–400.0)
RBC: 4.12 Mil/uL (ref 3.87–5.11)
RDW: 13.9 % (ref 11.5–15.5)
WBC: 4.9 10*3/uL (ref 4.0–10.5)

## 2022-08-06 LAB — COMPREHENSIVE METABOLIC PANEL
ALT: 16 U/L (ref 0–35)
AST: 17 U/L (ref 0–37)
Albumin: 4 g/dL (ref 3.5–5.2)
Alkaline Phosphatase: 54 U/L (ref 39–117)
BUN: 17 mg/dL (ref 6–23)
CO2: 27 mEq/L (ref 19–32)
Calcium: 9.7 mg/dL (ref 8.4–10.5)
Chloride: 100 mEq/L (ref 96–112)
Creatinine, Ser: 0.8 mg/dL (ref 0.40–1.20)
GFR: 73.9 mL/min (ref >60.00)
Glucose, Bld: 89 mg/dL (ref 70–99)
Potassium: 4.5 mEq/L (ref 3.5–5.1)
Sodium: 135 mEq/L (ref 135–145)
Total Bilirubin: 0.4 mg/dL (ref 0.2–1.2)
Total Protein: 7.1 g/dL (ref 6.0–8.3)

## 2022-08-06 LAB — LIPID PANEL
Cholesterol: 193 mg/dL (ref 0–200)
HDL: 56.2 mg/dL (ref >39.00)
LDL Cholesterol: 110 mg/dL — ABNORMAL HIGH (ref 0–99)
NonHDL: 137.25
Total CHOL/HDL Ratio: 3
Triglycerides: 138 mg/dL (ref 0.0–149.0)
VLDL: 27.6 mg/dL (ref 0.0–40.0)

## 2022-08-06 NOTE — Telephone Encounter (Signed)
Pt came in for lab visit only prior to OV with PCP on 08/13/22.  No future orders were in EPIC.  I drew an SST and lavender on pt.  Please place future orders if appropriate,

## 2022-08-06 NOTE — Telephone Encounter (Signed)
Orders in 

## 2022-08-12 NOTE — Assessment & Plan Note (Signed)
Supplement and monitor 

## 2022-08-12 NOTE — Assessment & Plan Note (Signed)
Encouraged to get adequate exercise, calcium and vitamin d intake 

## 2022-08-12 NOTE — Assessment & Plan Note (Signed)
Well controlled, no changes to meds. Encouraged heart healthy diet such as the DASH diet and exercise as tolerated.  °

## 2022-08-13 ENCOUNTER — Ambulatory Visit (INDEPENDENT_AMBULATORY_CARE_PROVIDER_SITE_OTHER): Payer: Medicare Other | Admitting: Family Medicine

## 2022-08-13 VITALS — BP 124/72 | HR 64 | Temp 97.5°F | Resp 16 | Ht 61.0 in | Wt 174.8 lb

## 2022-08-13 DIAGNOSIS — E538 Deficiency of other specified B group vitamins: Secondary | ICD-10-CM | POA: Diagnosis not present

## 2022-08-13 DIAGNOSIS — E559 Vitamin D deficiency, unspecified: Secondary | ICD-10-CM

## 2022-08-13 DIAGNOSIS — R739 Hyperglycemia, unspecified: Secondary | ICD-10-CM

## 2022-08-13 DIAGNOSIS — E785 Hyperlipidemia, unspecified: Secondary | ICD-10-CM | POA: Diagnosis not present

## 2022-08-13 DIAGNOSIS — E669 Obesity, unspecified: Secondary | ICD-10-CM

## 2022-08-13 DIAGNOSIS — I10 Essential (primary) hypertension: Secondary | ICD-10-CM

## 2022-08-13 DIAGNOSIS — M8580 Other specified disorders of bone density and structure, unspecified site: Secondary | ICD-10-CM | POA: Diagnosis not present

## 2022-08-13 DIAGNOSIS — Z6833 Body mass index (BMI) 33.0-33.9, adult: Secondary | ICD-10-CM | POA: Diagnosis not present

## 2022-08-13 NOTE — Progress Notes (Unsigned)
Subjective:    Patient ID: Carrie Elliott, female    DOB: 10-15-1950, 72 y.o.   MRN: [id number]  Chief Complaint  Patient presents with   Follow-up    Follow up    HPI Patient is in today for follow up on chronic medical concerns. No recent febrile illness or hospitalizations. Denies CP/palp/SOB/HA/congestion/fevers/GI or GU c/o. Taking meds as prescribed. No complaints of polyuria or polydipsia. No acute concerns. She is trying to stay busy but is not exercising routinely. Diet quality varies. Her vertigo symptoms have been minimized and she acknowledges occurs more with rapid movements. No falls or associated symptoms and happens infrequently.   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    Past Surgical History:  Procedure Laterality Date   APPENDECTOMY  1989   atypical nevus excision     BREAST BIOPSY Right    CHOLECYSTECTOMY  1989   COLONOSCOPY WITH PROPOFOL N/A 05/27/2015   Procedure: COLONOSCOPY WITH PROPOFOL;  Surgeon: Vida Rigger, MD;  Location: [facility];  Service: Endoscopy;  Laterality: N/A;   CYSTOSCOPY W/ URETERAL STENT REMOVAL  1999   ESOPHAGOGASTRODUODENOSCOPY  2011   with esophageal stent, perforation    ESOPHAGOGASTRODUODENOSCOPY (EGD) WITH PROPOFOL N/A 05/27/2015   Procedure: ESOPHAGOGASTRODUODENOSCOPY (EGD) WITH PROPOFOL;  Surgeon: Vida Rigger, MD;  Location: [facility];  Service: Endoscopy;  Laterality: N/A;   HEMORRHOID SURGERY  2005   LEFT OOPHORECTOMY  1999    complicated by ureteral injuery requiring a ureteral stent    MMK / ANTERIOR VESICOURETHROPEXY / URETHROPEXY  2000   TONSILLECTOMY AND ADENOIDECTOMY     TOTAL ABDOMINAL HYSTERECTOMY  2000   with USO   TUBAL LIGATION  1981   wisdom teeth removal  1964    Family History  Problem Relation Age of Onset   Colon polyps Father    Hypertension Father    Heart failure Father    Melanoma Father        multiple   Heart attack Father        x2   Heart disease Father        Bundle Block   Meniere's disease Father    Cancer Father        melanoma   Osteoporosis Mother    Transient ischemic attack Mother    Cancer Mother        colon   Heart disease Mother        afib   Hyperlipidemia Mother    Diabetes Paternal Grandmother    Stroke Maternal Grandmother    Cancer Maternal Grandmother        skin, melanoma, sarcoma   Colon polyps Maternal Grandmother    Diverticulitis Maternal Grandmother    Stroke Maternal Grandfather    Heart disease Maternal Grandfather    Obesity Brother     Social History   Socioeconomic History   Marital status: Married    Spouse name:  Not on file   Number of children: Not on file   Years of education: Not on file   Highest education level: Not on file  Occupational History   Not on file  Tobacco Use   Smoking status: Never   Smokeless tobacco: Never  Vaping Use   Vaping Use: Never used  Substance and Sexual Activity   Alcohol use: No   Drug use: No   Sexual activity: Yes    Birth control/protection: Surgical    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, avoids spicy  Other Topics Concern   Not on file  Social History Narrative   Not on file   Social Determinants of Health   Financial Resource Strain: Low Risk  (02/21/2021)   Overall Financial Resource Strain (CARDIA)    Difficulty of Paying Living Expenses: Not hard at all  Food Insecurity: No Food Insecurity (02/21/2022)   Hunger Vital Sign    Worried About Running Out of  Food in the Last Year: Never true    Ran Out of Food in the Last Year: Never true  Transportation Needs: No Transportation Needs (02/21/2022)   PRAPARE - Administrator, Civil Service (Medical): No    Lack of Transportation (Non-Medical): No  Physical Activity: Inactive (02/21/2021)   Exercise Vital Sign    Days of Exercise per Week: 0 days    Minutes of Exercise per Session: 0 min  Stress: No Stress Concern Present (02/21/2021)   [employer] of Occupational Health - Occupational Stress Questionnaire    Feeling of Stress : Not at all  Social Connections: Socially Integrated (02/21/2021)   Social Connection and Isolation Panel [NHANES]    Frequency of Communication with Friends and Family: More than three times a week    Frequency of Social Gatherings with Friends and Family: More than three times a week    Attends Religious Services: More than 4 times per year    Active Member of [employer] or Organizations: Yes    Attends Engineer, structural: More than 4 times per year    Marital Status: Married  Catering manager Violence: Not At Risk (02/21/2022)   Humiliation, Afraid, Rape, and Kick questionnaire    Fear of Current or Ex-Partner: No    Emotionally Abused: No    Physically Abused: No    Sexually Abused: No    Outpatient Medications Prior to Visit  Medication Sig Dispense Refill   amoxicillin-clavulanate (AUGMENTIN) 875-125 MG tablet Take 1 tablet by mouth 2 (two) times daily for 7 days. 14 tablet 0   benzocaine (ORAJEL) 10 % mucosal gel Use as directed 1 application in the mouth or throat as needed for mouth pain. Reported on 07/22/2015     Cholecalciferol (VITAMIN D3) 50 MCG (2000 UT) TABS Take by mouth. Take 1 tablet daily Monday-Friday. Take 2 tablets on Saturday.     COVID-19 mRNA vaccine 2023-2024 (COMIRNATY) syringe Inject into the muscle. 0.3 mL 0   esomeprazole (NEXIUM) 40 MG capsule Take 1 capsule by mouth once a day 90 capsule 3   famotidine  (PEPCID) 20 MG tablet Take 1 tablet by mouth twice a day as needed 180 tablet 2   fluticasone (FLONASE) 50 MCG/ACT nasal spray Place 2 sprays into both nostrils daily. 16 g 6   hydrochlorothiazide (MICROZIDE) 12.5 MG capsule TAKE 1 CAPSULE (12.5 MG TOTAL) BY MOUTH DAILY. TAKE IF > 110/70 90 capsule 2   Lidocaine-Hydrocort, Perianal, 3-0.5 % CREA Apply rectally as directed  once a day if needed 98 g 2   lisinopril (ZESTRIL) 10 MG tablet Take 1 tablet (10 mg total) by mouth 2 (two) times daily. 180 tablet 1   metoprolol succinate (TOPROL-XL) 50 MG 24 hr tablet Take 1-2 tablets (50-100 mg total) by mouth 2 (two) times daily with or immediately following a meal. 360 tablet 1   ondansetron (ZOFRAN) 8 MG tablet Take 1 tablet (8 mg total) by mouth every 8 (eight) hours as needed for nausea or vomiting. 90 tablet 1   RSV vaccine recomb adjuvanted (AREXVY) 120 MCG/0.5ML injection Inject into the muscle. 1 each 0   SIMPLY SALINE NA Place 1 each into the nose daily.      No facility-administered medications prior to visit.    Allergies  Allergen Reactions   Pseudoephedrine Hcl Er Other (See Comments)    TACHYCARDIA    Singulair [Montelukast Sodium] Other (See Comments)    Mental status changes, irritable   Cefdinir Hives   Celebrex [Celecoxib] Other (See Comments)    ABDOMINAL PAIN    Ciprofloxacin Other (See Comments)    ABDOMINAL PAIN    Codeine Nausea And Vomiting    NAUSEA VOMITING - can take with zofran    Review of Systems  Constitutional:  Negative for fever and malaise/fatigue.  HENT:  Negative for congestion.   Eyes:  Negative for blurred vision.  Respiratory:  Negative for shortness of breath.   Cardiovascular:  Negative for chest pain, palpitations and leg swelling.  Gastrointestinal:  Negative for abdominal pain, blood in stool and nausea.  Genitourinary:  Negative for dysuria and frequency.  Musculoskeletal:  Negative for falls.  Skin:  Negative for rash.  Neurological:   Negative for dizziness, loss of consciousness and headaches.  Endo/Heme/Allergies:  Negative for environmental allergies.  Psychiatric/Behavioral:  Negative for depression. The patient is not nervous/anxious.        Objective:    Physical Exam Constitutional:      General: She is not in acute distress (00).    Appearance: Normal appearance. She is well-developed. She is obese. She is not toxic-appearing.  HENT:     Head: Normocephalic and atraumatic.     Right Ear: External ear normal.     Left Ear: External ear normal.     Nose: Nose normal.  Eyes:     General:        Right eye: No discharge.        Left eye: No discharge.     Conjunctiva/sclera: Conjunctivae normal.  Neck:     Thyroid: No thyromegaly.  Cardiovascular:     Rate and Rhythm: Normal rate and regular rhythm.     Heart sounds: Normal heart sounds. No murmur heard. Pulmonary:     Effort: Pulmonary effort is normal. No respiratory distress.     Breath sounds: Normal breath sounds.  Abdominal:     General: Bowel sounds are normal.     Palpations: Abdomen is soft.     Tenderness: There is no abdominal tenderness. There is no guarding.  Musculoskeletal:        General: Normal range of motion.     Cervical back: Neck supple.  Lymphadenopathy:     Cervical: No cervical adenopathy.  Skin:    General: Skin is warm and dry.  Neurological:     Mental Status: She is alert and oriented to person, place, and time.  Psychiatric:        Mood and Affect: Mood normal.  Behavior: Behavior normal.        Thought Content: Thought content normal.        Judgment: Judgment normal.     BP 124/72 (BP Location: Right Arm, Patient Position: Sitting, Cuff Size: Normal)   Pulse 64   Temp (!) 97.5 F (36.4 C) (Oral)   Resp 16   Ht 5\' 1"  (1.549 m)   Wt 174 lb 12.8 oz (79.3 kg)   SpO2 98%   BMI 33.03 kg/m  Wt Readings from Last 3 Encounters:  08/13/22 174 lb 12.8 oz (79.3 kg)  04/10/22 175 lb (79.4 kg)  03/08/22 170  lb (77.1 kg)    Diabetic Foot Exam - Simple   No data filed    Lab Results  Component Value Date   WBC 4.9 08/06/2022   HGB 12.4 08/06/2022   HCT 37.5 08/06/2022   PLT 259.0 08/06/2022   GLUCOSE 89 08/06/2022   CHOL 193 08/06/2022   TRIG 138.0 08/06/2022   HDL 56.20 08/06/2022   LDLDIRECT 108.0 03/08/2022   LDLCALC 110 (H) 08/06/2022   ALT 16 08/06/2022   AST 17 08/06/2022   NA 135 08/06/2022   K 4.5 08/06/2022   CL 100 08/06/2022   CREATININE 0.80 08/06/2022   BUN 17 08/06/2022   CO2 27 08/06/2022   TSH 0.78 02/05/2022   INR 1.19 02/07/2010   HGBA1C 5.8 10/05/2021    Lab Results  Component Value Date   TSH 0.78 02/05/2022   Lab Results  Component Value Date   WBC 4.9 08/06/2022   HGB 12.4 08/06/2022   HCT 37.5 08/06/2022   MCV 91.0 08/06/2022   PLT 259.0 08/06/2022   Lab Results  Component Value Date   NA 135 08/06/2022   K 4.5 08/06/2022   CO2 27 08/06/2022   GLUCOSE 89 08/06/2022   BUN 17 08/06/2022   CREATININE 0.80 08/06/2022   BILITOT 0.4 08/06/2022   ALKPHOS 54 08/06/2022   AST 17 08/06/2022   ALT 16 08/06/2022   PROT 7.1 08/06/2022   ALBUMIN 4.0 08/06/2022   CALCIUM 9.7 08/06/2022   ANIONGAP 8 10/13/2019   GFR 73.90 08/06/2022   Lab Results  Component Value Date   CHOL 193 08/06/2022   Lab Results  Component Value Date   HDL 56.20 08/06/2022   Lab Results  Component Value Date   LDLCALC 110 (H) 08/06/2022   Lab Results  Component Value Date   TRIG 138.0 08/06/2022   Lab Results  Component Value Date   CHOLHDL 3 08/06/2022   Lab Results  Component Value Date   HGBA1C 5.8 10/05/2021       Assessment & Plan:  Primary hypertension Assessment & Plan: Well controlled, no changes to meds. Encouraged heart healthy diet such as the DASH diet and exercise as tolerated.    Orders: -     CBC with Differential/Platelet; Future -     Comprehensive metabolic panel; Future -     TSH; Future  Osteopenia, unspecified  location Assessment & Plan: Encouraged to get adequate exercise, calcium and vitamin d intake    Vitamin B12 deficiency Assessment & Plan: Supplement and monitor   Orders: -     Vitamin B12; Future  Vitamin D deficiency Assessment & Plan: Supplement and monitor   Orders: -     VITAMIN D 25 Hydroxy (Vit-D Deficiency, Fractures); Future  Hyperglycemia Assessment & Plan: hgba1c acceptable, minimize simple carbs. Increase exercise as tolerated.  Orders: -     Hemoglobin  A1c; Future  Hyperlipidemia, unspecified hyperlipidemia type Assessment & Plan: Encourage heart healthy diet such as MIND or DASH diet, increase exercise, avoid trans fats, simple carbohydrates and processed foods, consider a krill or fish or flaxseed oil cap daily.   Orders: -     Lipid panel; Future  Obesity, unspecified classification, unspecified obesity type, unspecified whether serious comorbidity present Assessment & Plan: Encouraged DASH or MIND diet, decrease po intake and increase exercise as tolerated. Needs 7-8 hours of sleep nightly. Avoid trans fats, eat small, frequent meals every 4-5 hours with lean proteins, complex carbs and healthy fats. Minimize simple carbs, high fat foods and processed foods. Offered referral to healthy weight and wellness and declines for now     Danise Edge, MD

## 2022-08-13 NOTE — Assessment & Plan Note (Signed)
Encouraged DASH or MIND diet, decrease po intake and increase exercise as tolerated. Needs 7-8 hours of sleep nightly. Avoid trans fats, eat small, frequent meals every 4-5 hours with lean proteins, complex carbs and healthy fats. Minimize simple carbs, high fat foods and processed foods. Offered referral to healthy weight and wellness and declines for now

## 2022-08-13 NOTE — Patient Instructions (Signed)

## 2022-08-14 ENCOUNTER — Encounter: Payer: Self-pay | Admitting: Family Medicine

## 2022-08-14 NOTE — Assessment & Plan Note (Signed)
Encourage heart healthy diet such as MIND or DASH diet, increase exercise, avoid trans fats, simple carbohydrates and processed foods, consider a krill or fish or flaxseed oil cap daily.  °

## 2022-08-14 NOTE — Assessment & Plan Note (Signed)
hgba1c acceptable, minimize simple carbs. Increase exercise as tolerated.  

## 2022-09-24 ENCOUNTER — Other Ambulatory Visit (HOSPITAL_BASED_OUTPATIENT_CLINIC_OR_DEPARTMENT_OTHER): Payer: Self-pay

## 2022-10-08 ENCOUNTER — Other Ambulatory Visit (HOSPITAL_BASED_OUTPATIENT_CLINIC_OR_DEPARTMENT_OTHER): Payer: Self-pay

## 2022-10-08 MED ORDER — FAMOTIDINE 20 MG PO TABS
20.0000 mg | ORAL_TABLET | Freq: Two times a day (BID) | ORAL | 2 refills | Status: DC | PRN
  Filled 2022-10-08: qty 180, 90d supply, fill #0
  Filled 2023-03-11: qty 180, 90d supply, fill #1

## 2022-10-09 ENCOUNTER — Other Ambulatory Visit (HOSPITAL_BASED_OUTPATIENT_CLINIC_OR_DEPARTMENT_OTHER): Payer: Self-pay

## 2022-10-09 ENCOUNTER — Encounter: Payer: Self-pay | Admitting: Urology

## 2022-10-09 ENCOUNTER — Ambulatory Visit (INDEPENDENT_AMBULATORY_CARE_PROVIDER_SITE_OTHER): Payer: Medicare Other | Admitting: Urology

## 2022-10-09 VITALS — BP 125/82 | HR 71 | Ht 61.0 in | Wt 170.0 lb

## 2022-10-09 DIAGNOSIS — R319 Hematuria, unspecified: Secondary | ICD-10-CM | POA: Diagnosis not present

## 2022-10-09 DIAGNOSIS — N281 Cyst of kidney, acquired: Secondary | ICD-10-CM | POA: Diagnosis not present

## 2022-10-09 DIAGNOSIS — R3129 Other microscopic hematuria: Secondary | ICD-10-CM

## 2022-10-09 LAB — MICROSCOPIC EXAMINATION
Cast Type: NONE SEEN
Casts: NONE SEEN /lpf
Crystal Type: NONE SEEN
Crystals: NONE SEEN
Mucus, UA: NONE SEEN
Trichomonas, UA: NONE SEEN
Yeast, UA: NONE SEEN

## 2022-10-09 LAB — URINALYSIS, ROUTINE W REFLEX MICROSCOPIC
Bilirubin, UA: NEGATIVE
Glucose, UA: NEGATIVE
Ketones, UA: NEGATIVE
Nitrite, UA: NEGATIVE
Protein,UA: NEGATIVE
Specific Gravity, UA: 1.02 (ref 1.005–1.030)
Urobilinogen, Ur: 0.2 mg/dL (ref 0.2–1.0)
pH, UA: 6.5 (ref 5.0–7.5)

## 2022-10-09 NOTE — Progress Notes (Signed)
Assessment: 1. Microscopic hematuria   2. Bilateral renal cysts    Plan: I discussed the urinalysis results with the patient and her husband today.  She has had a long history of microscopic hematuria with a prior negative evaluation.  I discussed the role of repeat evaluation.  She would like to continue to monitor at this time. Return to office in 1 year  Chief Complaint:   Chief Complaint  Patient presents with   Hematuria   History of Present Illness:  Carrie Elliott is a 72 y.o. female who is seen for further evaluation of hematuria. She was found to have blood on a dipstick urinalysis in October 2023.  Dipstick urinalysis from 01/23/2022 showed trace blood.  Urine culture showed no growth.  Repeat urinalysis from 02/05/2022 showed small blood.  Urine culture grew <10K colonies. No gross hematuria.  No flank pain. U/A 03/08/22: 0-2 RBC   CT abdomen and pelvis with contrast from 01/25/2022 showed no renal or ureteral calculi, no hydronephrosis, bilateral renal cysts, and diverticulitis in the sigmoid colon. Treated with antibiotics.  Her left lower abdominal pain improved.  She has a history of a cystocele.  She underwent a hysterectomy and MMK procedure a number of years ago.  She has noted a recurrent bulge in the vaginal area.  This was previously evaluated by urogynecology.  She has nocturia 1-3 times and rare urinary incontinence associated with coughing and sneezing.  No history of UTIs.  She has occasional discomfort associated with the vaginal bulge.  She previously underwent hematuria evaluation by Dr. Lindley Magnus in 2007.  No abnormalities were found on cystoscopy.  Urine cytology was negative. At her visit in 1/24, her urinary symptoms were unchanged.  No dysuria or gross hematuria.  No flank pain. U/A showed no RBCs.  She presents today for scheduled follow-up.  No new urinary symptoms.  No dysuria or gross hematuria.   No flank pain.  Portions of the above documentation  were copied from a prior visit for review purposes only.  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    Past Surgical History:  Past Surgical History:  Procedure Laterality Date   APPENDECTOMY  1989   atypical nevus excision     BREAST BIOPSY Right    CHOLECYSTECTOMY  1989   COLONOSCOPY WITH PROPOFOL N/A 05/27/2015   Procedure: COLONOSCOPY WITH PROPOFOL;  Surgeon: Vida Rigger, MD;  Location: [facility];  Service: Endoscopy;  Laterality: N/A;   CYSTOSCOPY W/ URETERAL STENT REMOVAL  1999   ESOPHAGOGASTRODUODENOSCOPY  2011   with esophageal stent, perforation    ESOPHAGOGASTRODUODENOSCOPY (EGD) WITH PROPOFOL N/A 05/27/2015   Procedure: ESOPHAGOGASTRODUODENOSCOPY (EGD) WITH PROPOFOL;  Surgeon: Vida Rigger, MD;  Location: [facility];  Service: Endoscopy;  Laterality: N/A;   HEMORRHOID SURGERY  2005   LEFT OOPHORECTOMY  1999   complicated by ureteral injuery requiring a ureteral stent  MMK / ANTERIOR VESICOURETHROPEXY / URETHROPEXY  2000   TONSILLECTOMY AND ADENOIDECTOMY     TOTAL ABDOMINAL HYSTERECTOMY  2000   with USO   TUBAL LIGATION  1981   wisdom teeth removal  1964    Allergies:  Allergies  Allergen Reactions   Pseudoephedrine Hcl Er Other (See Comments)    TACHYCARDIA    Singulair [Montelukast Sodium] Other (See Comments)    Mental status changes, irritable   Cefdinir Hives   Celebrex [Celecoxib] Other (See Comments)    ABDOMINAL PAIN    Ciprofloxacin  Other (See Comments)    ABDOMINAL PAIN    Codeine Nausea And Vomiting    NAUSEA VOMITING - can take with zofran    Family History:  Family History  Problem Relation Age of Onset   Colon polyps Father    Hypertension Father    Heart failure Father    Melanoma Father        multiple   Heart attack Father        x2   Heart disease Father        Bundle Block   Meniere's disease Father    Cancer Father        melanoma   Osteoporosis Mother    Transient ischemic attack Mother    Cancer Mother        colon   Heart disease Mother        afib   Hyperlipidemia Mother    Diabetes Paternal Grandmother    Stroke Maternal Grandmother    Cancer Maternal Grandmother        skin, melanoma, sarcoma   Colon polyps Maternal Grandmother    Diverticulitis Maternal Grandmother    Stroke Maternal Grandfather    Heart disease Maternal Grandfather    Obesity Brother     Social History:  Social History   Tobacco Use   Smoking status: Never   Smokeless tobacco: Never  Vaping Use   Vaping status: Never Used  Substance Use Topics   Alcohol use: No   Drug use: No    ROS: Constitutional:  Negative for fever, chills, weight loss CV: Negative for chest pain, previous MI, hypertension Respiratory:  Negative for shortness of breath, wheezing, sleep apnea, frequent cough GI:  Negative for nausea, vomiting, bloody stool, GERD   Physical exam: BP 125/82   Pulse 71   Ht 5\' 1"  (1.549 m)   Wt 170 lb (77.1 kg)   BMI 32.12 kg/m  GENERAL APPEARANCE:  Well appearing, well developed, well nourished, NAD HEENT:  Atraumatic, normocephalic, oropharynx clear NECK:  Supple without lymphadenopathy or thyromegaly ABDOMEN:  Soft, non-tender, no masses EXTREMITIES:  Moves all extremities well, without clubbing, cyanosis, or edema NEUROLOGIC:  Alert and oriented x 3, normal gait, CN II-XII grossly intact MENTAL STATUS:  appropriate BACK:  Non-tender to palpation, No CVAT SKIN:  Warm, dry, and  intact  Results: U/A: 3-10 RBCs

## 2022-11-05 ENCOUNTER — Other Ambulatory Visit (HOSPITAL_BASED_OUTPATIENT_CLINIC_OR_DEPARTMENT_OTHER): Payer: Self-pay

## 2022-12-05 DIAGNOSIS — H52203 Unspecified astigmatism, bilateral: Secondary | ICD-10-CM | POA: Diagnosis not present

## 2022-12-05 DIAGNOSIS — H43811 Vitreous degeneration, right eye: Secondary | ICD-10-CM | POA: Diagnosis not present

## 2022-12-05 DIAGNOSIS — H2513 Age-related nuclear cataract, bilateral: Secondary | ICD-10-CM | POA: Diagnosis not present

## 2023-01-09 ENCOUNTER — Other Ambulatory Visit (HOSPITAL_BASED_OUTPATIENT_CLINIC_OR_DEPARTMENT_OTHER): Payer: Self-pay

## 2023-01-09 ENCOUNTER — Encounter: Payer: Self-pay | Admitting: Family

## 2023-01-11 ENCOUNTER — Encounter: Payer: Self-pay | Admitting: Family

## 2023-01-11 ENCOUNTER — Other Ambulatory Visit: Payer: Self-pay | Admitting: Family Medicine

## 2023-01-11 ENCOUNTER — Other Ambulatory Visit (HOSPITAL_BASED_OUTPATIENT_CLINIC_OR_DEPARTMENT_OTHER): Payer: Self-pay

## 2023-01-11 DIAGNOSIS — Z1231 Encounter for screening mammogram for malignant neoplasm of breast: Secondary | ICD-10-CM

## 2023-01-11 DIAGNOSIS — Z23 Encounter for immunization: Secondary | ICD-10-CM | POA: Diagnosis not present

## 2023-01-11 MED ORDER — INFLUENZA VAC A&B SURF ANT ADJ 0.5 ML IM SUSY
0.5000 mL | PREFILLED_SYRINGE | Freq: Once | INTRAMUSCULAR | 0 refills | Status: AC
  Filled 2023-01-11: qty 0.5, 1d supply, fill #0

## 2023-01-15 ENCOUNTER — Inpatient Hospital Stay
Admission: RE | Admit: 2023-01-15 | Discharge: 2023-01-15 | Discharge status: Home / Self Care | Payer: Medicare Other | Source: Ambulatory Visit | Attending: Family Medicine | Admitting: Family Medicine

## 2023-01-15 DIAGNOSIS — Z1231 Encounter for screening mammogram for malignant neoplasm of breast: Secondary | ICD-10-CM | POA: Diagnosis not present

## 2023-01-29 ENCOUNTER — Other Ambulatory Visit (HOSPITAL_BASED_OUTPATIENT_CLINIC_OR_DEPARTMENT_OTHER): Payer: Self-pay

## 2023-01-29 ENCOUNTER — Other Ambulatory Visit: Payer: Self-pay | Admitting: Family Medicine

## 2023-01-29 ENCOUNTER — Other Ambulatory Visit: Payer: Self-pay

## 2023-01-29 ENCOUNTER — Encounter: Payer: Self-pay | Admitting: Family

## 2023-01-29 MED ORDER — LISINOPRIL 10 MG PO TABS
10.0000 mg | ORAL_TABLET | Freq: Two times a day (BID) | ORAL | 1 refills | Status: DC
  Filled 2023-01-29: qty 180, 90d supply, fill #0

## 2023-01-31 ENCOUNTER — Other Ambulatory Visit (HOSPITAL_BASED_OUTPATIENT_CLINIC_OR_DEPARTMENT_OTHER): Payer: Self-pay

## 2023-01-31 DIAGNOSIS — Z23 Encounter for immunization: Secondary | ICD-10-CM | POA: Diagnosis not present

## 2023-01-31 MED ORDER — COVID-19 MRNA VAC-TRIS(PFIZER) 30 MCG/0.3ML IM SUSY
0.3000 mL | PREFILLED_SYRINGE | Freq: Once | INTRAMUSCULAR | 0 refills | Status: AC
Start: 2023-01-31 — End: 2023-02-01
  Filled 2023-01-31: qty 0.3, 1d supply, fill #0

## 2023-02-05 ENCOUNTER — Other Ambulatory Visit (HOSPITAL_BASED_OUTPATIENT_CLINIC_OR_DEPARTMENT_OTHER): Payer: Self-pay

## 2023-02-05 ENCOUNTER — Other Ambulatory Visit: Payer: Self-pay | Admitting: Family Medicine

## 2023-02-05 ENCOUNTER — Other Ambulatory Visit: Payer: Self-pay

## 2023-02-07 ENCOUNTER — Other Ambulatory Visit (INDEPENDENT_AMBULATORY_CARE_PROVIDER_SITE_OTHER): Payer: Medicare Other

## 2023-02-07 DIAGNOSIS — I10 Essential (primary) hypertension: Secondary | ICD-10-CM

## 2023-02-07 DIAGNOSIS — E785 Hyperlipidemia, unspecified: Secondary | ICD-10-CM

## 2023-02-07 DIAGNOSIS — E559 Vitamin D deficiency, unspecified: Secondary | ICD-10-CM | POA: Diagnosis not present

## 2023-02-07 DIAGNOSIS — R739 Hyperglycemia, unspecified: Secondary | ICD-10-CM

## 2023-02-07 DIAGNOSIS — E538 Deficiency of other specified B group vitamins: Secondary | ICD-10-CM

## 2023-02-07 LAB — COMPREHENSIVE METABOLIC PANEL
ALT: 15 U/L (ref 0–35)
AST: 18 U/L (ref 0–37)
Albumin: 4.2 g/dL (ref 3.5–5.2)
Alkaline Phosphatase: 52 U/L (ref 39–117)
BUN: 18 mg/dL (ref 6–23)
CO2: 27 meq/L (ref 19–32)
Calcium: 9.6 mg/dL (ref 8.4–10.5)
Chloride: 99 meq/L (ref 96–112)
Creatinine, Ser: 0.91 mg/dL (ref 0.40–1.20)
GFR: 63.09 mL/min (ref >60.00)
Glucose, Bld: 89 mg/dL (ref 70–99)
Potassium: 4.5 meq/L (ref 3.5–5.1)
Sodium: 133 meq/L — ABNORMAL LOW (ref 135–145)
Total Bilirubin: 0.4 mg/dL (ref 0.2–1.2)
Total Protein: 7.1 g/dL (ref 6.0–8.3)

## 2023-02-07 LAB — CBC WITH DIFFERENTIAL/PLATELET
Basophils Absolute: 0 10*3/uL (ref 0.0–0.1)
Basophils Relative: 0.7 % (ref 0.0–3.0)
Eosinophils Absolute: 0.2 10*3/uL (ref 0.0–0.7)
Eosinophils Relative: 2.9 % (ref 0.0–5.0)
HCT: 39 % (ref 36.0–46.0)
Hemoglobin: 12.9 g/dL (ref 12.0–15.0)
Lymphocytes Relative: 25.1 % (ref 12.0–46.0)
Lymphs Abs: 1.6 10*3/uL (ref 0.7–4.0)
MCHC: 33.2 g/dL (ref 30.0–36.0)
MCV: 91 fL (ref 78.0–100.0)
Monocytes Absolute: 0.6 10*3/uL (ref 0.1–1.0)
Monocytes Relative: 8.6 % (ref 3.0–12.0)
Neutro Abs: 4.1 10*3/uL (ref 1.4–7.7)
Neutrophils Relative %: 62.7 % (ref 43.0–77.0)
Platelets: 261 10*3/uL (ref 150.0–400.0)
RBC: 4.28 Mil/uL (ref 3.87–5.11)
RDW: 13.1 % (ref 11.5–15.5)
WBC: 6.5 10*3/uL (ref 4.0–10.5)

## 2023-02-07 LAB — TSH: TSH: 1.01 u[IU]/mL (ref 0.35–5.50)

## 2023-02-07 LAB — LIPID PANEL
Cholesterol: 202 mg/dL — ABNORMAL HIGH (ref 0–200)
HDL: 49.8 mg/dL (ref >39.00)
LDL Cholesterol: 115 mg/dL — ABNORMAL HIGH (ref 0–99)
NonHDL: 152.18
Total CHOL/HDL Ratio: 4
Triglycerides: 186 mg/dL — ABNORMAL HIGH (ref 0.0–149.0)
VLDL: 37.2 mg/dL (ref 0.0–40.0)

## 2023-02-07 LAB — VITAMIN D 25 HYDROXY (VIT D DEFICIENCY, FRACTURES): VITD: 29.03 ng/mL — ABNORMAL LOW (ref 30.00–100.00)

## 2023-02-07 LAB — VITAMIN B12: Vitamin B-12: 638 pg/mL (ref 211–911)

## 2023-02-07 LAB — HEMOGLOBIN A1C: Hgb A1c MFr Bld: 5.8 % (ref 4.6–6.5)

## 2023-02-13 ENCOUNTER — Telehealth: Payer: Self-pay | Admitting: Family Medicine

## 2023-02-13 NOTE — Assessment & Plan Note (Addendum)
Encouraged to get adequate exercise, calcium and vitamin d intake, last Dexa September 2022

## 2023-02-13 NOTE — Telephone Encounter (Signed)
Copied from CRM [id number]. Topic: Medicare AWV >> Feb 13, 2023 10:33 AM Payton Doughty wrote: Reason for CRM: Called LVM 02/13/2023 to schedule Annual Wellness Visit  Verlee Rossetti; Care Guide Ambulatory Clinical Support [place] [facility] Direct Dial: [phone]

## 2023-02-13 NOTE — Assessment & Plan Note (Signed)
Well controlled, no changes to meds. Encouraged heart healthy diet such as the DASH diet and exercise as tolerated.  °

## 2023-02-13 NOTE — Assessment & Plan Note (Signed)
Supplement and monitor 

## 2023-02-13 NOTE — Assessment & Plan Note (Signed)
Encourage heart healthy diet such as MIND or DASH diet, increase exercise, avoid trans fats, simple carbohydrates and processed foods, consider a krill or fish or flaxseed oil cap daily.  °

## 2023-02-13 NOTE — Assessment & Plan Note (Signed)
Hydrate and monitor 

## 2023-02-13 NOTE — Assessment & Plan Note (Signed)
hgba1c acceptable, minimize simple carbs. Increase exercise as tolerated.  

## 2023-02-14 ENCOUNTER — Ambulatory Visit (INDEPENDENT_AMBULATORY_CARE_PROVIDER_SITE_OTHER): Payer: Medicare Other | Admitting: Family Medicine

## 2023-02-14 ENCOUNTER — Other Ambulatory Visit (HOSPITAL_BASED_OUTPATIENT_CLINIC_OR_DEPARTMENT_OTHER): Payer: Self-pay

## 2023-02-14 ENCOUNTER — Other Ambulatory Visit: Payer: Self-pay

## 2023-02-14 VITALS — BP 124/74 | HR 76 | Temp 98.2°F | Resp 16 | Ht 61.0 in | Wt 177.2 lb

## 2023-02-14 DIAGNOSIS — I10 Essential (primary) hypertension: Secondary | ICD-10-CM

## 2023-02-14 DIAGNOSIS — M8580 Other specified disorders of bone density and structure, unspecified site: Secondary | ICD-10-CM

## 2023-02-14 DIAGNOSIS — E538 Deficiency of other specified B group vitamins: Secondary | ICD-10-CM

## 2023-02-14 DIAGNOSIS — E785 Hyperlipidemia, unspecified: Secondary | ICD-10-CM

## 2023-02-14 DIAGNOSIS — E559 Vitamin D deficiency, unspecified: Secondary | ICD-10-CM

## 2023-02-14 DIAGNOSIS — R739 Hyperglycemia, unspecified: Secondary | ICD-10-CM | POA: Diagnosis not present

## 2023-02-14 MED ORDER — METOPROLOL SUCCINATE ER 50 MG PO TB24
50.0000 mg | ORAL_TABLET | Freq: Two times a day (BID) | ORAL | 1 refills | Status: DC
  Filled 2023-02-14: qty 360, 90d supply, fill #0
  Filled 2023-08-13: qty 360, 90d supply, fill #1

## 2023-02-14 MED ORDER — HYDROCHLOROTHIAZIDE 12.5 MG PO CAPS
12.5000 mg | ORAL_CAPSULE | Freq: Every day | ORAL | 1 refills | Status: DC
  Filled 2023-02-14: qty 90, 90d supply, fill #0

## 2023-02-14 MED ORDER — FLUTICASONE PROPIONATE 50 MCG/ACT NA SUSP
2.0000 | Freq: Every day | NASAL | 6 refills | Status: DC
  Filled 2023-02-14 – 2023-03-11 (×3): qty 16, 30d supply, fill #0
  Filled 2023-04-29: qty 16, 30d supply, fill #1
  Filled 2023-07-08: qty 16, 30d supply, fill #2
  Filled 2023-08-13: qty 16, 30d supply, fill #3

## 2023-02-14 MED ORDER — LISINOPRIL 10 MG PO TABS
10.0000 mg | ORAL_TABLET | Freq: Two times a day (BID) | ORAL | 1 refills | Status: DC
  Filled 2023-02-14 – 2023-04-29 (×3): qty 180, 90d supply, fill #0
  Filled 2023-07-29: qty 180, 90d supply, fill #1

## 2023-02-14 NOTE — Patient Instructions (Signed)
 Chronic Knee Pain, Adult Knee pain that lasts longer than 3 months is called chronic knee pain. You may have pain in one or both knees. Symptoms of chronic knee pain may also include swelling and stiffness. Many conditions can cause chronic knee pain. The most common cause is wear and tear of your knee joint as you get older. Other possible causes include: A disease that causes inflammation of the knee, such as rheumatoid arthritis. This usually affects both knees. A condition called inflammatory arthritis, such as gout. An injury to the knee that causes arthritis. An injury to the knee that damages the ligaments. Ligaments are tissues that connect bones to each other. Runner's knee or pain behind the kneecap. Treatment for chronic knee pain depends on the cause. The main treatments for chronic knee pain are: Doing exercises to help your knee move better and get stronger, called physical therapy. Losing weight if you are overweight. This condition may also be treated with medicines, injections, a knee sleeve or brace, and by using crutches. You health care provider may also recommend rest, ice, pressure (compression), and elevation, also called RICE therapy. Follow these instructions at home: If you have a knee sleeve or brace that can be taken off:  Wear the knee sleeve or brace as told by your provider. Take it off only if your provider says that you can. Check the skin around it every day. Tell your provider if you see problems. Loosen the knee sleeve or brace if your toes tingle, are numb, or turn cold and blue. Keep the knee sleeve or brace clean and dry. Bathing If the knee sleeve or brace is not waterproof: Do not let it get wet. Cover it when you take a bath or shower. Use a cover that does not let any water in. Managing pain, stiffness, and swelling     If told, put heat on the area. Do this as often as told. Use the heat source that your provider recommends, such as a moist  heat pack or a heating pad. If you have a knee sleeve or brace that you can take off, remove it as told. Place a towel between your skin and the heat source. Leave the heat on for 20-30 minutes. If told, put ice on the area. If you have a knee sleeve or brace that you can take off, remove it as told. Put ice in a plastic bag. Place a towel between your skin and the bag. Leave the ice on for 20 minutes, 2-3 times a day. If your skin turns bright red, remove the ice or heat right away to prevent skin damage. The risk of damage is higher if you cannot feel pain, heat, or cold. Move your toes often to reduce stiffness and swelling. Raise the injured area above the level of your heart while you are sitting or lying down. Use a pillow to support your foot as needed. Activity Avoid activities where both feet leave the ground at the same time. Avoid running, jumping rope, and doing jumping jacks. Follow the exercise plan that your provider made for you. Your provider may suggest that you: Avoid activities that make knee pain worse. This may mean that you need to change your exercise routines, sports, or job duties. Wear shoes with cushioned soles. Avoid sports that require running and sudden changes in direction. Do physical therapy. Physical therapy helps your knee move better and get stronger. Exercise as told. Do exercises that increase balance and strength, such as  tai chi and yoga. Do not stand or walk on your injured knee until you're told it's okay. Use crutches as told. Return to normal activities when you're told. Ask what things are safe for you to do. General instructions Take your medicines only as told by your provider. If you are overweight, work with your provider and an expert in healthy eating called a dietitian to set goals to lose weight. Losing even a little weight can reduce knee pain. Being overweight can make your knee hurt more. Do not smoke, vape, or use products with  nicotine or tobacco in them. If you need help quitting, talk with your provider. Keep all follow-up visits. Your provider will monitor your pain and try other treatments if needed. Contact a health care provider if: You have knee pain that is not getting better or gets worse. You are not able to do your exercises due to knee pain. Get help right away if: Your knee swells and the swelling becomes worse. You cannot move your knee. You have severe knee pain. This information is not intended to replace advice given to you by your health care provider. Make sure you discuss any questions you have with your health care provider. Document Revised: 05/07/2022 Document Reviewed: 05/07/2022 Elsevier Patient Education  2024 [employer].

## 2023-02-15 ENCOUNTER — Encounter: Payer: Self-pay | Admitting: Family Medicine

## 2023-02-15 ENCOUNTER — Other Ambulatory Visit (HOSPITAL_BASED_OUTPATIENT_CLINIC_OR_DEPARTMENT_OTHER): Payer: Self-pay

## 2023-02-15 NOTE — Progress Notes (Signed)
Subjective:    Patient ID: Carrie Elliott, female    DOB: [birth year], 72 y.o.   MRN: [id number]  Chief Complaint  Patient presents with  . Follow-up    HPI Discussed the use of AI scribe software for clinical note transcription with the patient, who gave verbal consent to proceed.  History of Present Illness   The patient reports that her pain is improving and is not debilitating but it did escalate after a recent fall. She feels she has largely healed after her recent fall. The patient also mentions a need for self-control in terms of diet and exercise, acknowledging the need to lose weight. The patient has been making efforts to stay hydrated, consume protein regularly, and maintain regular movement. The patient also discusses the importance of maintaining social connections for overall health. The patient's recent lab results show a slightly low sodium level and a slightly low vitamin D level, for which the patient has increased her intake. The patient has received her flu shot and COVID vaccine.        Past Medical History:  Diagnosis Date  . Allergy   . Arthritis    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, shoulders, knees  . Benign paroxysmal positional vertigo 07/12/2013  . Cystocele    grade 2  . Endometriosis    with Menometrorhaghia   . Gastric polyps 09/30/2015  . GERD (gastroesophageal reflux disease)   . Hemorrhoids   . History of colonic polyps 09/30/2015   adenoma  . HTN (hypertension) 04/23/2013  . 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 Osteopenia 10/09/2015  . Sternal fracture    1986  . Vitamin D deficiency 04/06/2016  . 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 APPENDECTOMY  1989  . atypical nevus excision    . BREAST BIOPSY Right   . CHOLECYSTECTOMY  1989  . COLONOSCOPY WITH PROPOFOL N/A 05/27/2015   Procedure: COLONOSCOPY WITH PROPOFOL;  Surgeon: Vida Rigger, MD;  Location: [facility];  Service: Endoscopy;  Laterality: N/A;  .  CYSTOSCOPY W/ URETERAL STENT REMOVAL  1999  . ESOPHAGOGASTRODUODENOSCOPY  2011   with esophageal stent, perforation   . ESOPHAGOGASTRODUODENOSCOPY (EGD) WITH PROPOFOL N/A 05/27/2015   Procedure: ESOPHAGOGASTRODUODENOSCOPY (EGD) WITH PROPOFOL;  Surgeon: Vida Rigger, MD;  Location: [facility];  Service: Endoscopy;  Laterality: N/A;  . HEMORRHOID SURGERY  2005  . LEFT OOPHORECTOMY  1999   complicated by ureteral injuery requiring a ureteral stent   . MMK / ANTERIOR VESICOURETHROPEXY / URETHROPEXY  2000  . TONSILLECTOMY AND ADENOIDECTOMY    . TOTAL ABDOMINAL HYSTERECTOMY  2000   with USO  . TUBAL LIGATION  1981  . wisdom teeth removal  1964    Family History  Problem Relation Age of Onset  . Colon polyps Father   . Hypertension Father   . Heart failure Father   . Melanoma Father        multiple  . Heart attack Father        x2  . Heart disease Father        Bundle Block  . Meniere's disease Father   . Cancer Father        melanoma  . Osteoporosis Mother   . Transient ischemic attack Mother   . Cancer Mother        colon  . Heart disease Mother        afib  . Hyperlipidemia Mother   . Diabetes Paternal Grandmother   .  Stroke Maternal Grandmother   . Cancer Maternal Grandmother        skin, melanoma, sarcoma  . Colon polyps Maternal Grandmother   . Diverticulitis Maternal Grandmother   . Stroke Maternal Grandfather   . Heart disease Maternal Grandfather   . Obesity Brother     Social History   Socioeconomic History  . Marital status: Married    Spouse name: Not on file  . Number of children: Not on file  . Years of education: Not on file  . Highest education level: Not on file  Occupational History  . Not on file  Tobacco Use  . Smoking status: Never  . Smokeless tobacco: Never  Vaping Use  . Vaping status: Never Used  Substance and Sexual Activity  . Alcohol use: No  . Drug use: No  . 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 Not on file  Social History Narrative  . Not on file   Social Determinants of Health   Financial Resource Strain: Low Risk  (02/21/2021)   Overall Financial Resource Strain (CARDIA)   . Difficulty of Paying Living Expenses: Not hard at all  Food Insecurity: No Food Insecurity (02/21/2022)   Hunger Vital Sign   . Worried About Programme researcher, broadcasting/film/video in the Last Year: Never true   . Ran Out of Food in the Last Year: Never true  Transportation Needs: No Transportation Needs (02/21/2022)   PRAPARE - Transportation   . Lack of Transportation (Medical): No   . Lack of Transportation (Non-Medical): No  Physical Activity: Inactive (02/21/2021)   Exercise Vital Sign   . Days of Exercise per Week: 0 days   . Minutes of Exercise per Session: 0 min  Stress: No Stress Concern Present (02/21/2021)   [employer] of Occupational Health - Occupational Stress Questionnaire   . Feeling of Stress : Not at all  Social Connections: Socially Integrated (02/21/2021)   Social Connection and Isolation Panel [NHANES]   . Frequency of Communication with Friends and Family: More than three times a week   . Frequency of Social Gatherings with Friends and Family: More than three times a week   . Attends Religious Services: More than 4 times per year   . Active Member of Clubs or Organizations: Yes   . Attends Banker Meetings: More than 4 times per year   . Marital Status: Married  Catering manager Violence: Not At Risk (02/21/2022)   Humiliation, Afraid, Rape, and Kick questionnaire   . Fear of Current or Ex-Partner: No   . Emotionally Abused: No   . Physically Abused: No   . Sexually Abused: No    Outpatient Medications Prior to Visit  Medication Sig Dispense Refill  . Cholecalciferol (VITAMIN D3) 50 MCG (2000 UT) TABS Take by mouth. Take 1 tablet daily Monday-Friday. Take 2 tablets on  Saturday.    . esomeprazole (NEXIUM) 40 MG capsule Take 1 capsule by mouth once a day 90 capsule 3  . famotidine (PEPCID) 20 MG tablet Take 1 tablet by mouth twice a day as needed 180 tablet 2  . Lidocaine-Hydrocort, Perianal, 3-0.5 % CREA Apply rectally as directed once a day if needed 98 g 2  . SIMPLY SALINE NA Place 1 each into the nose daily.     Marland Kitchen COVID-19 mRNA vaccine 2023-2024 (COMIRNATY) syringe Inject into the muscle. 0.3 mL  0  . fluticasone (FLONASE) 50 MCG/ACT nasal spray Place 2 sprays into both nostrils daily. 16 g 6  . lisinopril (ZESTRIL) 10 MG tablet Take 1 tablet (10 mg total) by mouth 2 (two) times daily. 180 tablet 1  . metoprolol succinate (TOPROL-XL) 50 MG 24 hr tablet Take 1-2 tablets (50-100 mg total) by mouth 2 (two) times daily with or immediately following a meal. 360 tablet 1  . RSV vaccine recomb adjuvanted (AREXVY) 120 MCG/0.5ML injection Inject into the muscle. 1 each 0  . ondansetron (ZOFRAN) 8 MG tablet Take 1 tablet (8 mg total) by mouth every 8 (eight) hours as needed for nausea or vomiting. (Patient not taking: Reported on 02/14/2023) 90 tablet 1  . amoxicillin-clavulanate (AUGMENTIN) 875-125 MG tablet Take 1 tablet by mouth 2 (two) times daily for 7 days. (Patient not taking: Reported on 02/14/2023) 14 tablet 0  . benzocaine (ORAJEL) 10 % mucosal gel Use as directed 1 application in the mouth or throat as needed for mouth pain. Reported on 07/22/2015 (Patient not taking: Reported on 02/14/2023)    . hydrochlorothiazide (MICROZIDE) 12.5 MG capsule TAKE 1 CAPSULE (12.5 MG TOTAL) BY MOUTH DAILY. TAKE IF > 110/70 90 capsule 2   No facility-administered medications prior to visit.    Allergies  Allergen Reactions  . Pseudoephedrine Hcl Er Other (See Comments)    TACHYCARDIA   . Singulair [Montelukast Sodium] Other (See Comments)    Mental status changes, irritable  . Cefdinir Hives  . Celebrex [Celecoxib] Other (See Comments)    ABDOMINAL PAIN   .  Ciprofloxacin Other (See Comments)    ABDOMINAL PAIN   . Codeine Nausea And Vomiting    NAUSEA VOMITING - can take with zofran    Review of Systems  Constitutional:  Negative for fever and malaise/fatigue.  HENT:  Negative for congestion.   Eyes:  Negative for blurred vision.  Respiratory:  Negative for shortness of breath.   Cardiovascular:  Negative for chest pain, palpitations and leg swelling.  Gastrointestinal:  Negative for abdominal pain, blood in stool and nausea.  Genitourinary:  Negative for dysuria and frequency.  Musculoskeletal:  Positive for falls, joint pain and myalgias.  Skin:  Negative for rash.  Neurological:  Negative for dizziness, loss of consciousness and headaches.  Endo/Heme/Allergies:  Negative for environmental allergies.  Psychiatric/Behavioral:  Negative for depression. The patient is not nervous/anxious.       Objective:    Physical Exam Constitutional:      General: She is not in acute distress.    Appearance: Normal appearance. She is well-developed. She is not toxic-appearing.  HENT:     Head: Normocephalic and atraumatic.     Right Ear: External ear normal.     Left Ear: External ear normal.     Nose: Nose normal.  Eyes:     General:        Right eye: No discharge.        Left eye: No discharge.     Conjunctiva/sclera: Conjunctivae normal.  Neck:     Thyroid: No thyromegaly.  Cardiovascular:     Rate and Rhythm: Normal rate and regular rhythm.     Heart sounds: Normal heart sounds. No murmur heard. Pulmonary:     Effort: Pulmonary effort is normal. No respiratory distress.     Breath sounds: Normal breath sounds.  Abdominal:     General: Bowel sounds are normal.     Palpations: Abdomen is soft.     Tenderness:  There is no abdominal tenderness. There is no guarding.  Musculoskeletal:        General: Normal range of motion.     Cervical back: Neck supple.  Lymphadenopathy:     Cervical: No cervical adenopathy.  Skin:    General:  Skin is warm and dry.  Neurological:     Mental Status: She is alert and oriented to person, place, and time.  Psychiatric:        Mood and Affect: Mood normal.        Behavior: Behavior normal.        Thought Content: Thought content normal.        Judgment: Judgment normal.   BP 124/74 (BP Location: Left Arm, Patient Position: Sitting, Cuff Size: Large)   Pulse 76   Temp 98.2 F (36.8 C) (Oral)   Resp 16   Ht 5\' 1"  (1.549 m)   Wt 177 lb 3.2 oz (80.4 kg)   SpO2 98%   BMI 33.48 kg/m  Wt Readings from Last 3 Encounters:  02/14/23 177 lb 3.2 oz (80.4 kg)  10/09/22 170 lb (77.1 kg)  08/13/22 174 lb 12.8 oz (79.3 kg)    Diabetic Foot Exam - Simple   No data filed    Lab Results  Component Value Date   WBC 6.5 02/07/2023   HGB 12.9 02/07/2023   HCT 39.0 02/07/2023   PLT 261.0 02/07/2023   GLUCOSE 89 02/07/2023   CHOL 202 (H) 02/07/2023   TRIG 186.0 (H) 02/07/2023   HDL 49.80 02/07/2023   LDLDIRECT 108.0 03/08/2022   LDLCALC 115 (H) 02/07/2023   ALT 15 02/07/2023   AST 18 02/07/2023   NA 133 (L) 02/07/2023   K 4.5 02/07/2023   CL 99 02/07/2023   CREATININE 0.91 02/07/2023   BUN 18 02/07/2023   CO2 27 02/07/2023   TSH 1.01 02/07/2023   INR 1.19 02/07/2010   HGBA1C 5.8 02/07/2023    Lab Results  Component Value Date   TSH 1.01 02/07/2023   Lab Results  Component Value Date   WBC 6.5 02/07/2023   HGB 12.9 02/07/2023   HCT 39.0 02/07/2023   MCV 91.0 02/07/2023   PLT 261.0 02/07/2023   Lab Results  Component Value Date   NA 133 (L) 02/07/2023   K 4.5 02/07/2023   CO2 27 02/07/2023   GLUCOSE 89 02/07/2023   BUN 18 02/07/2023   CREATININE 0.91 02/07/2023   BILITOT 0.4 02/07/2023   ALKPHOS 52 02/07/2023   AST 18 02/07/2023   ALT 15 02/07/2023   PROT 7.1 02/07/2023   ALBUMIN 4.2 02/07/2023   CALCIUM 9.6 02/07/2023   ANIONGAP 8 10/13/2019   GFR 63.09 02/07/2023   Lab Results  Component Value Date   CHOL 202 (H) 02/07/2023   Lab Results   Component Value Date   HDL 49.80 02/07/2023   Lab Results  Component Value Date   LDLCALC 115 (H) 02/07/2023   Lab Results  Component Value Date   TRIG 186.0 (H) 02/07/2023   Lab Results  Component Value Date   CHOLHDL 4 02/07/2023   Lab Results  Component Value Date   HGBA1C 5.8 02/07/2023       Assessment & Plan:  Primary hypertension Assessment & Plan: Well controlled, no changes to meds. Encouraged heart healthy diet such as the DASH diet and exercise as tolerated.    Orders: -     Comprehensive metabolic panel; Future -     CBC with Differential/Platelet;  Future -     TSH; Future  Hyperglycemia Assessment & Plan: hgba1c acceptable, minimize simple carbs. Increase exercise as tolerated.  Orders: -     Hemoglobin A1c; Future  Hyperlipidemia, unspecified hyperlipidemia type Assessment & Plan: Encourage heart healthy diet such as MIND or DASH diet, increase exercise, avoid trans fats, simple carbohydrates and processed foods, consider a krill or fish or flaxseed oil cap daily.   Orders: -     Lipid panel; Future  Osteopenia, unspecified location Assessment & Plan: Encouraged to get adequate exercise, calcium and vitamin d intake, last Dexa September 2022   Vitamin B12 deficiency Assessment & Plan: Supplement and monitor   Orders: -     Vitamin B12; Future  Vitamin D deficiency Assessment & Plan: Supplement and monitor   Orders: -     VITAMIN D 25 Hydroxy (Vit-D Deficiency, Fractures); Future  Other orders -     Fluticasone Propionate; Place 2 sprays into both nostrils daily.  Dispense: 16 g; Refill: 6 -     hydroCHLOROthiazide; Take 1 capsule (12.5 mg total) by mouth daily. take if >110/70  Dispense: 90 capsule; Refill: 1 -     Lisinopril; Take 1 tablet (10 mg total) by mouth 2 (two) times daily.  Dispense: 180 tablet; Refill: 1 -     Metoprolol Succinate ER; Take 1-2 tablets (50-100 mg total) by mouth 2 (two) times daily with or immediately  following a meal.  Dispense: 360 tablet; Refill: 1    Assessment and Plan    Weight Management Discussed the importance of diet, hydration, and physical activity. Patient is motivated to lose weight and is aware of the benefits of whole foods, fresh fruits, and vegetables. -Encouraged to continue with healthy dietary choices and increase physical activity. -Recommended hydration of 10 ounces every 1-2 hours and protein intake every 3-4 hours. -Encouraged to increase daily steps, aiming for a minimum of 4000 steps per day.  Vitamin D Deficiency Patient's Vitamin D levels were slightly low. Patient has started taking additional Vitamin D supplement. -Continue taking 2000 IU of Vitamin D daily (1000 IU from multivitamin and additional 1000 IU supplement).  Fall Risk Recent fall with minor injuries. Discussed the importance of safety measures to prevent falls. -Advised to avoid activities that increase fall risk such as climbing ladders. -Consider referral to physical therapy if knee pain persists or worsens.  Hyperlipidemia Slight elevation in cholesterol levels. Patient acknowledges the role of dietary choices. -Continue current management and monitor lipid panel in 3 months.  General Health Maintenance -Continue with annual wellness visits. -Ensure up-to-date with vaccinations. -Consider x-ray of tailbone if pain persists.         Danise Edge, MD

## 2023-02-16 ENCOUNTER — Encounter: Payer: Self-pay | Admitting: Family Medicine

## 2023-02-18 NOTE — Telephone Encounter (Signed)
Looks like It has already been added to [facility]

## 2023-03-11 ENCOUNTER — Other Ambulatory Visit: Payer: Self-pay

## 2023-03-11 ENCOUNTER — Other Ambulatory Visit (HOSPITAL_BASED_OUTPATIENT_CLINIC_OR_DEPARTMENT_OTHER): Payer: Self-pay

## 2023-03-13 ENCOUNTER — Other Ambulatory Visit (HOSPITAL_BASED_OUTPATIENT_CLINIC_OR_DEPARTMENT_OTHER): Payer: Self-pay

## 2023-04-08 ENCOUNTER — Other Ambulatory Visit (HOSPITAL_BASED_OUTPATIENT_CLINIC_OR_DEPARTMENT_OTHER): Payer: Self-pay

## 2023-04-23 ENCOUNTER — Ambulatory Visit: Payer: Medicare Other | Admitting: Pulmonary Disease

## 2023-04-23 ENCOUNTER — Encounter: Payer: Self-pay | Admitting: Pulmonary Disease

## 2023-04-23 VITALS — BP 112/64 | HR 74 | Temp 97.8°F | Ht 61.0 in | Wt 178.0 lb

## 2023-04-23 DIAGNOSIS — G4733 Obstructive sleep apnea (adult) (pediatric): Secondary | ICD-10-CM | POA: Diagnosis not present

## 2023-04-23 NOTE — Patient Instructions (Signed)
I will see you a year from now  Continue using your CPAP  If you contact the medical supply company, they should be able to help with getting you a new mask that fits better  The download from your machine shows that the machine is working well

## 2023-04-23 NOTE — Progress Notes (Unsigned)
Subjective:     Patient ID: Carrie Elliott, female   DOB: 1950/12/09, 73 y.o.   MRN: [id number]   Patient being seen for follow-up of mild obstructive sleep apnea  Continues to be compliant with CPAP on a regular basis  Denies any significant changes in her health  Continues to benefit from the machine  No significant daytime sleepiness No medication changes  Sleep pattern has not really changed much Usually tries to go to bed between 10 and 10:30 PM Takes about 30 minutes to 1 hour to fall asleep Wakes up about once to 3 times during the night Wakes up about 6 AM on workdays  Hypertension Never smoked  Not having any issues   Review of Systems  Constitutional: Negative.  Negative for fatigue.  Eyes: Negative.   Respiratory:  Positive for apnea. Negative for cough.   Gastrointestinal: Negative.   Endocrine: Negative.   Genitourinary: Negative.   Musculoskeletal: Negative.   Skin: Negative.   Hematological: Negative.   Psychiatric/Behavioral:  Positive for sleep disturbance.   All other systems reviewed and are negative.  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, avoids spicy  Other Topics Concern   Not on file  Social History Narrative   Not on file   Social Drivers of Health   Financial Resource Strain: Low Risk  (02/21/2021)   Overall Financial Resource Strain (CARDIA)    Difficulty of Paying Living Expenses: Not hard at all  Food Insecurity: No Food Insecurity (02/21/2022)   Hunger Vital Sign    Worried About Running Out of Food in the Last Year: Never true    Ran Out of Food in the Last Year: Never true  Transportation Needs: No Transportation Needs (02/21/2022)   PRAPARE - Administrator, Civil Service (Medical): No    Lack of Transportation (Non-Medical): No  Physical Activity: Inactive (02/21/2021)   Exercise Vital Sign    Days of Exercise per Week: 0 days    Minutes of Exercise per Session: 0 min  Stress: No Stress Concern Present (02/21/2021)   [employer] of Occupational Health - Occupational Stress Questionnaire    Feeling of Stress : Not at all  Social Connections: Socially Integrated (02/21/2021)   Social Connection and Isolation Panel [NHANES]  Frequency of Communication with Friends and Family: More than three times a week    Frequency of Social Gatherings with Friends and Family: More than three times a week    Attends Religious Services: More than 4 times per year    Active Member of [employer] or Organizations: Yes    Attends Engineer, structural: More than 4 times per year    Marital Status: Married  Catering manager Violence: Not At Risk (02/21/2022)   Humiliation, Afraid, Rape, and Kick questionnaire    Fear of Current or Ex-Partner: No    Emotionally Abused: No    Physically Abused: No    Sexually Abused: No   Family History  Problem Relation Age of Onset   Colon polyps  Father    Hypertension Father    Heart failure Father    Melanoma Father        multiple   Heart attack Father        x2   Heart disease Father        Bundle Block   Meniere's disease Father    Cancer Father        melanoma   Osteoporosis Mother    Transient ischemic attack Mother    Cancer Mother        colon   Heart disease Mother        afib   Hyperlipidemia Mother    Diabetes Paternal Grandmother    Stroke Maternal Grandmother    Cancer Maternal Grandmother        skin, melanoma, sarcoma   Colon polyps Maternal Grandmother    Diverticulitis Maternal Grandmother    Stroke Maternal Grandfather    Heart disease Maternal Grandfather    Obesity Brother        Objective:   Physical Exam Constitutional:      Appearance: Normal appearance. She is obese.  HENT:     Head: Normocephalic and atraumatic.     Mouth/Throat:     Mouth: Mucous membranes are moist.  Eyes:     Pupils: Pupils are equal, round, and reactive to light.  Cardiovascular:     Rate and Rhythm: Normal rate and regular rhythm.     Heart sounds: No murmur heard.    No friction rub.  Pulmonary:     Effort: Pulmonary effort is normal. No respiratory distress.     Breath sounds: Normal breath sounds. No stridor. No wheezing or rhonchi.  Musculoskeletal:     Cervical back: No rigidity or tenderness. No muscular tenderness.  Neurological:     Mental Status: She is alert.  Psychiatric:        Mood and Affect: Mood normal.     Vitals:   04/23/23 0942  BP: 112/64  Pulse: 74  Temp: 97.8 F (36.6 C)  SpO2: 97%      09/30/2018    1:00 PM  Results of the Epworth flowsheet  Sitting and reading 1  Watching TV 1  Sitting, inactive in a public place (e.g. a theatre or a meeting) 1  As a passenger in a car for an hour without a break 2  Lying down to rest in the afternoon when circumstances permit 2  Sitting and talking to someone 0  Sitting quietly after a lunch without alcohol 1  In a car, while  stopped for a few minutes in traffic 0  Total score 8   Sleep study reviewed in epic  Compliance data shows excellent compliance with AutoSet of  5-17, 95 percentile pressure of 12.4 Residual AHI of 3.0     Assessment:     Mild obstructive sleep apnea -Symptoms are well controlled  Daytime sleepiness -Appears better  Encouraged to continue use of CPAP     Plan:     Continue CPAP nightly  Weight loss as tolerated  No changes to CPAP pressures  Follow-up a year from now

## 2023-04-29 ENCOUNTER — Other Ambulatory Visit (HOSPITAL_BASED_OUTPATIENT_CLINIC_OR_DEPARTMENT_OTHER): Payer: Self-pay

## 2023-05-01 ENCOUNTER — Encounter: Payer: Self-pay | Admitting: Pulmonary Disease

## 2023-05-01 DIAGNOSIS — G4733 Obstructive sleep apnea (adult) (pediatric): Secondary | ICD-10-CM

## 2023-05-29 NOTE — Addendum Note (Signed)
 Addended by: Jama Flavors on: 05/29/2023 03:32 PM   Modules accepted: Orders

## 2023-05-29 NOTE — Telephone Encounter (Signed)
 Kindly place order for nasal mask

## 2023-07-02 DIAGNOSIS — D2261 Melanocytic nevi of right upper limb, including shoulder: Secondary | ICD-10-CM | POA: Diagnosis not present

## 2023-07-02 DIAGNOSIS — L821 Other seborrheic keratosis: Secondary | ICD-10-CM | POA: Diagnosis not present

## 2023-07-02 DIAGNOSIS — L814 Other melanin hyperpigmentation: Secondary | ICD-10-CM | POA: Diagnosis not present

## 2023-07-02 DIAGNOSIS — D225 Melanocytic nevi of trunk: Secondary | ICD-10-CM | POA: Diagnosis not present

## 2023-07-02 DIAGNOSIS — L820 Inflamed seborrheic keratosis: Secondary | ICD-10-CM | POA: Diagnosis not present

## 2023-07-08 ENCOUNTER — Other Ambulatory Visit (HOSPITAL_BASED_OUTPATIENT_CLINIC_OR_DEPARTMENT_OTHER): Payer: Self-pay

## 2023-07-10 ENCOUNTER — Other Ambulatory Visit (HOSPITAL_BASED_OUTPATIENT_CLINIC_OR_DEPARTMENT_OTHER): Payer: Self-pay

## 2023-07-10 ENCOUNTER — Other Ambulatory Visit: Payer: Self-pay

## 2023-07-10 MED ORDER — ESOMEPRAZOLE MAGNESIUM 40 MG PO CPDR
40.0000 mg | DELAYED_RELEASE_CAPSULE | Freq: Every day | ORAL | 0 refills | Status: DC
  Filled 2023-07-10: qty 90, 90d supply, fill #0

## 2023-07-22 ENCOUNTER — Other Ambulatory Visit: Payer: Self-pay | Admitting: Family Medicine

## 2023-07-25 ENCOUNTER — Other Ambulatory Visit: Payer: Self-pay

## 2023-07-25 ENCOUNTER — Encounter: Payer: Self-pay | Admitting: Family

## 2023-07-25 ENCOUNTER — Other Ambulatory Visit (HOSPITAL_BASED_OUTPATIENT_CLINIC_OR_DEPARTMENT_OTHER): Payer: Self-pay

## 2023-07-25 DIAGNOSIS — Z860100 Personal history of colon polyps, unspecified: Secondary | ICD-10-CM | POA: Diagnosis not present

## 2023-07-25 DIAGNOSIS — K5790 Diverticulosis of intestine, part unspecified, without perforation or abscess without bleeding: Secondary | ICD-10-CM | POA: Diagnosis not present

## 2023-07-25 DIAGNOSIS — R194 Change in bowel habit: Secondary | ICD-10-CM | POA: Diagnosis not present

## 2023-07-25 DIAGNOSIS — K649 Unspecified hemorrhoids: Secondary | ICD-10-CM | POA: Diagnosis not present

## 2023-07-25 DIAGNOSIS — K317 Polyp of stomach and duodenum: Secondary | ICD-10-CM | POA: Diagnosis not present

## 2023-07-25 DIAGNOSIS — K2100 Gastro-esophageal reflux disease with esophagitis, without bleeding: Secondary | ICD-10-CM | POA: Diagnosis not present

## 2023-07-25 MED ORDER — FAMOTIDINE 20 MG PO TABS
20.0000 mg | ORAL_TABLET | Freq: Two times a day (BID) | ORAL | 3 refills | Status: AC
  Filled 2023-07-25: qty 180, 90d supply, fill #0
  Filled 2023-10-18: qty 180, 90d supply, fill #1
  Filled 2024-03-02 (×2): qty 180, 90d supply, fill #2

## 2023-07-25 MED ORDER — LIDOCAINE-HYDROCORTISONE ACE 3-0.5 % RE KIT
1.0000 | PACK | Freq: Every day | RECTAL | 2 refills | Status: AC | PRN
  Filled 2023-07-25: qty 28.35, 30d supply, fill #0
  Filled 2023-07-29: qty 1, 20d supply, fill #0

## 2023-07-25 MED ORDER — DICYCLOMINE HCL 10 MG PO CAPS
10.0000 mg | ORAL_CAPSULE | Freq: Two times a day (BID) | ORAL | 0 refills | Status: DC
  Filled 2023-07-25: qty 60, 30d supply, fill #0

## 2023-07-25 MED ORDER — ESOMEPRAZOLE MAGNESIUM 40 MG PO CPDR
40.0000 mg | DELAYED_RELEASE_CAPSULE | Freq: Every morning | ORAL | 3 refills | Status: AC
  Filled 2023-09-30: qty 90, 90d supply, fill #0
  Filled 2023-12-24: qty 90, 90d supply, fill #1
  Filled 2024-04-01 (×2): qty 90, 90d supply, fill #2

## 2023-07-26 ENCOUNTER — Other Ambulatory Visit (HOSPITAL_BASED_OUTPATIENT_CLINIC_OR_DEPARTMENT_OTHER): Payer: Self-pay

## 2023-07-29 ENCOUNTER — Encounter: Payer: Self-pay | Admitting: Family

## 2023-07-29 ENCOUNTER — Other Ambulatory Visit (HOSPITAL_BASED_OUTPATIENT_CLINIC_OR_DEPARTMENT_OTHER): Payer: Self-pay

## 2023-08-07 ENCOUNTER — Other Ambulatory Visit: Payer: Medicare Other

## 2023-08-08 ENCOUNTER — Other Ambulatory Visit: Payer: Medicare Other

## 2023-08-13 ENCOUNTER — Other Ambulatory Visit (HOSPITAL_BASED_OUTPATIENT_CLINIC_OR_DEPARTMENT_OTHER): Payer: Self-pay

## 2023-08-15 ENCOUNTER — Ambulatory Visit: Payer: Medicare Other | Admitting: Family Medicine

## 2023-08-15 ENCOUNTER — Other Ambulatory Visit (INDEPENDENT_AMBULATORY_CARE_PROVIDER_SITE_OTHER)

## 2023-08-15 DIAGNOSIS — I10 Essential (primary) hypertension: Secondary | ICD-10-CM

## 2023-08-15 DIAGNOSIS — R739 Hyperglycemia, unspecified: Secondary | ICD-10-CM

## 2023-08-15 DIAGNOSIS — E785 Hyperlipidemia, unspecified: Secondary | ICD-10-CM | POA: Diagnosis not present

## 2023-08-15 DIAGNOSIS — E538 Deficiency of other specified B group vitamins: Secondary | ICD-10-CM | POA: Diagnosis not present

## 2023-08-15 DIAGNOSIS — E559 Vitamin D deficiency, unspecified: Secondary | ICD-10-CM

## 2023-08-15 LAB — CBC WITH DIFFERENTIAL/PLATELET
Basophils Absolute: 0 10*3/uL (ref 0.0–0.1)
Basophils Relative: 0.8 % (ref 0.0–3.0)
Eosinophils Absolute: 0.2 10*3/uL (ref 0.0–0.7)
Eosinophils Relative: 3.4 % (ref 0.0–5.0)
HCT: 36.2 % (ref 36.0–46.0)
Hemoglobin: 12 g/dL (ref 12.0–15.0)
Lymphocytes Relative: 23.3 % (ref 12.0–46.0)
Lymphs Abs: 1.1 10*3/uL (ref 0.7–4.0)
MCHC: 33.2 g/dL (ref 30.0–36.0)
MCV: 87.9 fl (ref 78.0–100.0)
Monocytes Absolute: 0.5 10*3/uL (ref 0.1–1.0)
Monocytes Relative: 11.2 % (ref 3.0–12.0)
Neutro Abs: 2.8 10*3/uL (ref 1.4–7.7)
Neutrophils Relative %: 61.3 % (ref 43.0–77.0)
Platelets: 264 10*3/uL (ref 150.0–400.0)
RBC: 4.12 Mil/uL (ref 3.87–5.11)
RDW: 13.2 % (ref 11.5–15.5)
WBC: 4.6 10*3/uL (ref 4.0–10.5)

## 2023-08-15 LAB — COMPREHENSIVE METABOLIC PANEL WITH GFR
ALT: 29 U/L (ref 0–35)
AST: 36 U/L (ref 0–37)
Albumin: 4.5 g/dL (ref 3.5–5.2)
Alkaline Phosphatase: 50 U/L (ref 39–117)
BUN: 19 mg/dL (ref 6–23)
CO2: 27 meq/L (ref 19–32)
Calcium: 9.6 mg/dL (ref 8.4–10.5)
Chloride: 97 meq/L (ref 96–112)
Creatinine, Ser: 0.76 mg/dL (ref 0.40–1.20)
GFR: 78.03 mL/min (ref >60.00)
Glucose, Bld: 95 mg/dL (ref 70–99)
Potassium: 4.4 meq/L (ref 3.5–5.1)
Sodium: 130 meq/L — ABNORMAL LOW (ref 135–145)
Total Bilirubin: 0.5 mg/dL (ref 0.2–1.2)
Total Protein: 7.3 g/dL (ref 6.0–8.3)

## 2023-08-15 LAB — VITAMIN D 25 HYDROXY (VIT D DEFICIENCY, FRACTURES): VITD: 41.97 ng/mL (ref 30.00–100.00)

## 2023-08-15 LAB — TSH: TSH: 0.93 u[IU]/mL (ref 0.35–5.50)

## 2023-08-15 LAB — LIPID PANEL
Cholesterol: 193 mg/dL (ref 0–200)
HDL: 61.1 mg/dL (ref >39.00)
LDL Cholesterol: 109 mg/dL — ABNORMAL HIGH (ref 0–99)
NonHDL: 132.02
Total CHOL/HDL Ratio: 3
Triglycerides: 114 mg/dL (ref 0.0–149.0)
VLDL: 22.8 mg/dL (ref 0.0–40.0)

## 2023-08-15 LAB — HEMOGLOBIN A1C: Hgb A1c MFr Bld: 5.9 % (ref 4.6–6.5)

## 2023-08-15 LAB — VITAMIN B12: Vitamin B-12: 557 pg/mL (ref 211–911)

## 2023-08-19 ENCOUNTER — Ambulatory Visit: Payer: Self-pay | Admitting: Family Medicine

## 2023-08-19 DIAGNOSIS — E871 Hypo-osmolality and hyponatremia: Secondary | ICD-10-CM

## 2023-08-21 ENCOUNTER — Encounter: Payer: Self-pay | Admitting: Student

## 2023-08-21 NOTE — Assessment & Plan Note (Signed)
 This SmartLink has not been configured with any valid records.  Stable.  Supplement and monitor Lab Results  Component Value Date   VITAMINB12 557 08/15/2023   Stable. Supplement and Monitor.

## 2023-08-21 NOTE — Progress Notes (Addendum)
 Subjective:     Patient ID: Carrie Elliott, female    DOB: 1950-08-08, 73 y.o.   MRN: [id number]  Chief Complaint  Patient presents with   Follow-up    Concern: Balance issues x1-2 months, started on dicyclomine  (prescribed by GI) she noticed it can cause dizziness, she stopped on 5/23, she does have hx of vertigo Joint pain (knees, hands, ankles), cramping in B calves, toe cramping Knot in R nose, bleeding off and on x3 days Not Fasting    HPI Carrie Elliott is a 73 y.o. female who presents for follow up on chronic conditions  She is followed by GI, and Urology  Presents with c/o:  GERD: -Meds: On omeprazole (Nexium ) 40 mg daily and famotidine  (Pepcid ) 20 mg twice daily. Followed by GI: Dr. Reyes [facility]. Last seen Jul 25, 2023.  Colonoscopy and Endoscopy scheduled November 2025 -Compliant: yes -Well controlled  IBS-on Bentyl  10 mg twice daily -Compliant: -Management: -Symptoms:  Hypertension- -Meds on hydrochlorothiazide  12.5 mg daily, lisinopril  (Zestril ) 10 mg BID, metoprolol  succinate (TOPROL -XL) 50 mg 24-hour tablet twice daily -Compliant: yes  Vitamin D  deficiency -Meds on cholecalciferol (vitamin D3) 50 mcg (2000 UT) tabs daily  Microscopic Hematuria- Sees urology annually- [facility] Willye [facility]  HM-Dexa scan- Last was 2014  C/o Joint pain- bilateral hands, knees- occurring for months. Difficulty griping.   DIZZINESS-- States she has hx of BPPV in the past but this dizziness feels different. Does have Hx of falls last 2019, has not had fall since. Denies recent falls and syncope. Duration: months 2 months Description of symptoms: lightheaded Duration of episode: seconds Frequency of symptoms: recurrent Provoking factors: worse with less sleep and fatigue Triggered by rolling over in bed: no Triggered by bending over: occasionally Aggravated by head movement: no Aggravated by exertion, coughing, loud noises: no Recent head injury: no Recent or current  viral symptoms: no History of vasovagal episodes: no Nausea: no Vomiting: no Tinnitus: no Hearing loss: no Aural fullness: no Headache: no Photophobia/phonophobia: no Unsteady gait: no Postural instability: no Diplopia, dysarthria, dysphagia or weakness: no Related to exertion: no Pallor: no Diaphoresis: no Dyspnea: no Chest pain: no   C/o  increased frequency of episodes of nosebleeds over the past few days primarily from the left nostril. Bleeding typically lasts 5-10 minutes and resolves with direct pressure. Denies trauma, nasal picking, or recent  URI. No associated dizziness, weakness, or SOB. Denies nasal trauma, history of bleeding disorders. Reports no current use of anticoagulants or antiplatelet medications.  Patient denies fever, chills, SOB, CP, palpitations, dyspnea, edema, HA, vision changes, N/V/D, abdominal pain, urinary symptoms, rash, weight changes, and recent illness or hospitalizations.       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   Osteopenia 10/09/2015   Sternal fracture    1986   Vitamin D  deficiency 04/06/2016  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    Past Surgical History:  Procedure Laterality Date   APPENDECTOMY  1989   atypical nevus excision     BREAST BIOPSY Right    CHOLECYSTECTOMY  1989   COLONOSCOPY WITH PROPOFOL  N/A 05/27/2015   Procedure: COLONOSCOPY WITH PROPOFOL ;  Surgeon: Ozell Blunt, MD;  Location: [facility];  Service:  Endoscopy;  Laterality: N/A;   CYSTOSCOPY W/ URETERAL STENT REMOVAL  1999   ESOPHAGOGASTRODUODENOSCOPY  2011   with esophageal stent, perforation    ESOPHAGOGASTRODUODENOSCOPY (EGD) WITH PROPOFOL  N/A 05/27/2015   Procedure: ESOPHAGOGASTRODUODENOSCOPY (EGD) WITH PROPOFOL ;  Surgeon: Ozell Blunt, MD;  Location: [facility];  Service: Endoscopy;  Laterality: N/A;   HEMORRHOID SURGERY  2005   LEFT OOPHORECTOMY  1999   complicated by ureteral injuery requiring a ureteral stent    MMK / ANTERIOR VESICOURETHROPEXY / URETHROPEXY  2000   TONSILLECTOMY AND ADENOIDECTOMY     TOTAL ABDOMINAL HYSTERECTOMY  2000   with USO   TUBAL LIGATION  1981   wisdom teeth removal  1964    Family History  Problem Relation Age of Onset   Colon polyps Father    Hypertension Father    Heart failure Father    Melanoma Father        multiple   Heart attack Father        x2   Heart disease Father        Bundle Block   Meniere's disease Father    Cancer Father        melanoma   Osteoporosis Mother    Transient ischemic attack Mother    Cancer Mother        colon   Heart disease Mother        afib   Hyperlipidemia Mother    Diabetes Paternal Grandmother    Stroke Maternal Grandmother    Cancer Maternal Grandmother        skin, melanoma, sarcoma   Colon polyps Maternal Grandmother    Diverticulitis Maternal Grandmother    Stroke Maternal Grandfather    Heart disease Maternal Grandfather    Obesity Brother     Social History   Socioeconomic History   Marital status: Married    Spouse name: Not on file   Number of children: Not on file   Years of education: Not on file   Highest education level: Not on file  Occupational History   Not on file  Tobacco Use   Smoking status: Never   Smokeless tobacco: Never  Vaping Use   Vaping status: Never Used  Substance and Sexual Activity   Alcohol use: No   Drug use: No   Sexual activity: Yes    Birth control/protection: Surgical    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, avoids spicy  Other Topics Concern   Not on file  Social History Narrative   6 grand kids   Daughter and son in [place]-    Social Drivers of Health   Financial Resource Strain: Low Risk  (02/21/2021)   Overall Financial Resource Strain (CARDIA)    Difficulty of Paying Living Expenses: Not hard at all  Food Insecurity: No Food Insecurity (02/21/2022)   Hunger Vital Sign    Worried About Running Out of Food in the Last Year: Never true    Ran Out of Food in the Last Year: Never true  Transportation Needs: No Transportation Needs (02/21/2022)   PRAPARE - Administrator, Civil Service (Medical):  No    Lack of Transportation (Non-Medical): No  Physical Activity: Inactive (02/21/2021)   Exercise Vital Sign    Days of Exercise per Week: 0 days    Minutes of Exercise per Session: 0 min  Stress: No Stress Concern Present (02/21/2021)   [employer] of Occupational Health - Occupational Stress Questionnaire    Feeling of Stress : Not at all  Social Connections: Socially Integrated (02/21/2021)   Social Connection and Isolation Panel [NHANES]    Frequency of Communication with Friends and Family: More than three times a week    Frequency of Social Gatherings with Friends and Family: More than three times a week    Attends Religious Services: More than 4 times per year    Active Member of [employer] or Organizations: Yes    Attends Engineer, structural: More than 4 times per year    Marital Status: Married  Catering manager Violence: Not At Risk (02/21/2022)   Humiliation, Afraid, Rape, and Kick questionnaire    Fear of Current or Ex-Partner: No    Emotionally Abused: No    Physically Abused: No    Sexually Abused: No    Outpatient Medications Prior to Visit  Medication Sig Dispense Refill   Cholecalciferol (VITAMIN D3) 50 MCG (2000 UT) TABS Take by mouth. Take 1 tablet daily Monday-Friday. Take 2 tablets on Saturday.      esomeprazole  (NEXIUM ) 40 MG capsule Take 1 capsule (40 mg total) by mouth 1/2 to 1 hour before morning meal every morning 90 capsule 3   famotidine  (PEPCID ) 20 MG tablet Take 1 tablet (20 mg total) by mouth 2 (two) times daily for 90 days 180 tablet 3   Lidocaine -Hydrocortisone  Ace 3-0.5 % KIT Apply 1 Application topically daily as needed. 1 kit 2   SIMPLY SALINE NA Place 1 each into the nose daily.      fluticasone  (FLONASE ) 50 MCG/ACT nasal spray Place 2 sprays into both nostrils daily. 16 g 6   hydrochlorothiazide  (MICROZIDE ) 12.5 MG capsule Take 1 capsule (12.5 mg total) by mouth daily. take if >110/70 90 capsule 1   lisinopril  (ZESTRIL ) 10 MG tablet Take 1 tablet (10 mg total) by mouth 2 (two) times daily. 180 tablet 1   metoprolol  succinate (TOPROL -XL) 50 MG 24 hr tablet Take 1-2 tablets (50-100 mg total) by mouth 2 (two) times daily with or immediately following a meal. 360 tablet 1   dicyclomine  (BENTYL ) 10 MG capsule Take 1 capsule (10 mg total) by mouth 2 (two) times daily for 30 days (Patient not taking: Reported on 08/22/2023) 60 capsule 0   esomeprazole  (NEXIUM ) 40 MG capsule Take 1 capsule by mouth once a day 90 capsule 3   famotidine  (PEPCID ) 20 MG tablet Take 1 tablet by mouth twice a day as needed 180 tablet 2   Lidocaine -Hydrocort , Perianal, 3-0.5 % CREA Apply rectally as directed once a day if needed 98 g 2   No facility-administered medications prior to visit.    Allergies  Allergen Reactions   Pseudoephedrine Hcl Er Other (See Comments)    TACHYCARDIA    Singulair [Montelukast Sodium] Other (See Comments)    Mental status changes, irritable   Cefdinir Hives   Celebrex [Celecoxib] Other (See Comments)    ABDOMINAL PAIN    Ciprofloxacin Other (See Comments)    ABDOMINAL PAIN    Codeine Nausea And Vomiting    NAUSEA VOMITING - can take with zofran     ROS    See HPI  Objective:     Physical Exam  General: No acute distress. Awake and conversant.  ENT: No nasal  discharge, no nasal polyps visualized, no epistaxis upon exam.  Neck: Neck is supple. No masses or thyromegaly.  Respiratory: CTAB. Respirations are non-labored. No wheezing.  Skin: Warm. No rashes or ulcers.  Psych: Alert and oriented. Cooperative, Appropriate mood and affect, Normal judgment.  CV: RRR. No lower extremity edema.  MSK: Normal ambulation. No clubbing or cyanosis.  Neuro: Sensation and CN II-XII grossly normal.     BP 126/74   Pulse 69   Temp 97.9 F (36.6 C) (Oral)   Resp 16   Ht 5\' 1"  (1.549 m)   Wt 176 lb 8 oz (80.1 kg)   SpO2 97%   BMI 33.35 kg/m  Wt Readings from Last 3 Encounters:  08/22/23 176 lb 8 oz (80.1 kg)  04/23/23 178 lb (80.7 kg)  02/14/23 177 lb 3.2 oz (80.4 kg)       Assessment & Plan:   Problem List Items Addressed This Visit     Arthralgia of both hands   Likely r/t OA.  Will check RF factor to rule out RA.      Relevant Orders   Rheumatoid Factor   HTN (hypertension) (Chronic)   Well controlled. Encouraged heart healthy diet such as the DASH diet and exercise as tolerated.   Overall her BP has been controlled but due to complaints of dizziness and repeated low sodium levels HCTZ was discontinued and she was started on amlodipine  2.5 mg      Relevant Medications   amLODipine  (NORVASC ) 2.5 MG tablet   lisinopril  (ZESTRIL ) 10 MG tablet   metoprolol  succinate (TOPROL -XL) 50 MG 24 hr tablet   Iron deficiency anemia (Chronic)   Increase leafy greens, consider increased lean red meat and using cast iron cookware. Continue to monitor, report any concerns       Low sodium levels - Primary   Discontinued H CTZ 12.5 mg.  Recheck CMP today.      Relevant Orders   Comprehensive metabolic panel with GFR   Postmenopausal estrogen deficiency   Last DEXA 2014.  Referral sent to renew DEXA.       Relevant Orders   DG Bone Density   Vitamin B12 deficiency   This SmartLink has not been configured with any valid records.  Stable.   Supplement and monitor Lab Results  Component Value Date   VITAMINB12 557 08/15/2023   Stable. Supplement and Monitor.      Vitamin D  deficiency   Patient is taking vitamin D3 daily.  Encouraged adequate calcium and vitamin D .  Supplement and monitor. Lab Results  Component Value Date   VD25OH 41.97 08/15/2023    Supplement and Monitor       I have discontinued Dailey Alberson. [place]'s Lidocaine -Hydrocort  (Perianal) and hydrochlorothiazide . I am also having her start on amLODipine . Additionally, I am having her maintain her SIMPLY SALINE NA, Vitamin D3, dicyclomine , famotidine , esomeprazole , Lidocaine -Hydrocortisone  Ace, fluticasone , lisinopril , and metoprolol  succinate.  Meds ordered this encounter  Medications   amLODipine  (NORVASC ) 2.5 MG tablet    Sig: Take 1 tablet (2.5 mg total) by mouth daily.    Dispense:  90 tablet    Refill:  0    Supervising Provider:   Randie Bustle A [[id number]]   fluticasone  (FLONASE ) 50 MCG/ACT nasal spray    Sig: Place 2 sprays into both nostrils daily.    Dispense:  16 g  Refill:  6   lisinopril  (ZESTRIL ) 10 MG tablet    Sig: Take 1 tablet (10 mg total) by mouth 2 (two) times daily.    Dispense:  180 tablet    Refill:  0   metoprolol  succinate (TOPROL -XL) 50 MG 24 hr tablet    Sig: Take 1-2 tablets (50-100 mg total) by mouth 2 (two) times daily with or immediately following a meal.    Dispense:  360 tablet    Refill:  0

## 2023-08-21 NOTE — Assessment & Plan Note (Signed)
 Increase leafy greens, consider increased lean red meat and using cast iron cookware. Continue to monitor, report any concerns

## 2023-08-21 NOTE — Assessment & Plan Note (Signed)
 Patient is taking vitamin D3 daily.  Encouraged adequate calcium and vitamin D .  Supplement and monitor. Lab Results  Component Value Date   VD25OH 41.97 08/15/2023    Supplement and Monitor

## 2023-08-22 ENCOUNTER — Ambulatory Visit: Admitting: Student

## 2023-08-22 ENCOUNTER — Other Ambulatory Visit (HOSPITAL_BASED_OUTPATIENT_CLINIC_OR_DEPARTMENT_OTHER): Payer: Self-pay

## 2023-08-22 ENCOUNTER — Encounter: Payer: Self-pay | Admitting: Student

## 2023-08-22 VITALS — BP 126/74 | HR 69 | Temp 97.9°F | Resp 16 | Ht 61.0 in | Wt 176.5 lb

## 2023-08-22 DIAGNOSIS — Z780 Asymptomatic menopausal state: Secondary | ICD-10-CM

## 2023-08-22 DIAGNOSIS — I10 Essential (primary) hypertension: Secondary | ICD-10-CM | POA: Diagnosis not present

## 2023-08-22 DIAGNOSIS — E538 Deficiency of other specified B group vitamins: Secondary | ICD-10-CM | POA: Diagnosis not present

## 2023-08-22 DIAGNOSIS — E559 Vitamin D deficiency, unspecified: Secondary | ICD-10-CM

## 2023-08-22 DIAGNOSIS — D500 Iron deficiency anemia secondary to blood loss (chronic): Secondary | ICD-10-CM | POA: Diagnosis not present

## 2023-08-22 DIAGNOSIS — M25542 Pain in joints of left hand: Secondary | ICD-10-CM

## 2023-08-22 DIAGNOSIS — E871 Hypo-osmolality and hyponatremia: Secondary | ICD-10-CM | POA: Diagnosis not present

## 2023-08-22 DIAGNOSIS — M25541 Pain in joints of right hand: Secondary | ICD-10-CM | POA: Diagnosis not present

## 2023-08-22 DIAGNOSIS — E785 Hyperlipidemia, unspecified: Secondary | ICD-10-CM

## 2023-08-22 DIAGNOSIS — R040 Epistaxis: Secondary | ICD-10-CM | POA: Diagnosis not present

## 2023-08-22 LAB — COMPREHENSIVE METABOLIC PANEL WITH GFR
ALT: 24 U/L (ref 0–35)
AST: 27 U/L (ref 0–37)
Albumin: 4.3 g/dL (ref 3.5–5.2)
Alkaline Phosphatase: 46 U/L (ref 39–117)
BUN: 20 mg/dL (ref 6–23)
CO2: 25 meq/L (ref 19–32)
Calcium: 9.3 mg/dL (ref 8.4–10.5)
Chloride: 99 meq/L (ref 96–112)
Creatinine, Ser: 0.8 mg/dL (ref 0.40–1.20)
GFR: 73.36 mL/min (ref >60.00)
Glucose, Bld: 78 mg/dL (ref 70–99)
Potassium: 4.2 meq/L (ref 3.5–5.1)
Sodium: 132 meq/L — ABNORMAL LOW (ref 135–145)
Total Bilirubin: 0.4 mg/dL (ref 0.2–1.2)
Total Protein: 7.2 g/dL (ref 6.0–8.3)

## 2023-08-22 MED ORDER — METOPROLOL SUCCINATE ER 50 MG PO TB24
50.0000 mg | ORAL_TABLET | Freq: Two times a day (BID) | ORAL | 0 refills | Status: DC
  Filled 2023-08-22: qty 360, 90d supply, fill #0

## 2023-08-22 MED ORDER — FLUTICASONE PROPIONATE 50 MCG/ACT NA SUSP
2.0000 | Freq: Every day | NASAL | 6 refills | Status: DC
  Filled 2023-08-22 – 2023-09-17 (×2): qty 16, 30d supply, fill #0
  Filled 2023-10-18: qty 16, 30d supply, fill #1
  Filled 2023-12-24: qty 48, 90d supply, fill #2

## 2023-08-22 MED ORDER — AMLODIPINE BESYLATE 2.5 MG PO TABS
2.5000 mg | ORAL_TABLET | Freq: Every day | ORAL | 0 refills | Status: DC
  Filled 2023-08-22: qty 90, 90d supply, fill #0

## 2023-08-22 MED ORDER — LISINOPRIL 10 MG PO TABS
10.0000 mg | ORAL_TABLET | Freq: Two times a day (BID) | ORAL | 0 refills | Status: DC
  Filled 2023-08-22 – 2023-10-18 (×2): qty 180, 90d supply, fill #0

## 2023-08-22 NOTE — Assessment & Plan Note (Signed)
 Lab Results  Component Value Date   CHOL 193 08/15/2023   HDL 61.10 08/15/2023   LDLCALC 109 (H) 08/15/2023   LDLDIRECT 108.0 03/08/2022   TRIG 114.0 08/15/2023   CHOLHDL 3 08/15/2023    Encourage heart healthy diet such as MIND or DASH diet, increase exercise, avoid trans fats, simple carbohydrates and processed foods, consider a krill or fish or flaxseed oil cap daily.

## 2023-08-22 NOTE — Assessment & Plan Note (Signed)
 Discontinued H CTZ 12.5 mg.  Recheck CMP today.

## 2023-08-22 NOTE — Assessment & Plan Note (Signed)
 Well controlled. Encouraged heart healthy diet such as the DASH diet and exercise as tolerated.   Overall her BP has been controlled but due to complaints of dizziness and repeated low sodium levels HCTZ was discontinued and she was started on amlodipine 2.5 mg

## 2023-08-22 NOTE — Assessment & Plan Note (Signed)
 Last DEXA 2014.  Referral sent to renew DEXA.

## 2023-08-22 NOTE — Assessment & Plan Note (Signed)
 Likely r/t OA.  Will check RF factor to rule out RA.

## 2023-08-23 ENCOUNTER — Ambulatory Visit: Payer: Self-pay | Admitting: Student

## 2023-08-23 LAB — RHEUMATOID FACTOR: Rheumatoid fact SerPl-aCnc: <10 [IU]/mL (ref <14)

## 2023-08-27 ENCOUNTER — Emergency Department (HOSPITAL_COMMUNITY)

## 2023-08-27 ENCOUNTER — Other Ambulatory Visit: Payer: Self-pay

## 2023-08-27 ENCOUNTER — Encounter (HOSPITAL_COMMUNITY): Payer: Self-pay

## 2023-08-27 ENCOUNTER — Emergency Department (HOSPITAL_COMMUNITY)
Admission: EM | Admit: 2023-08-27 | Discharge: 2023-08-28 | Disposition: A | Discharge status: Home / Self Care | Attending: Emergency Medicine | Admitting: Emergency Medicine

## 2023-08-27 DIAGNOSIS — Z79899 Other long term (current) drug therapy: Secondary | ICD-10-CM | POA: Diagnosis not present

## 2023-08-27 DIAGNOSIS — I10 Essential (primary) hypertension: Secondary | ICD-10-CM | POA: Insufficient documentation

## 2023-08-27 DIAGNOSIS — R079 Chest pain, unspecified: Secondary | ICD-10-CM | POA: Diagnosis not present

## 2023-08-27 DIAGNOSIS — R0789 Other chest pain: Secondary | ICD-10-CM | POA: Diagnosis not present

## 2023-08-27 DIAGNOSIS — R739 Hyperglycemia, unspecified: Secondary | ICD-10-CM

## 2023-08-27 LAB — CBC
HCT: 36.9 % (ref 36.0–46.0)
Hemoglobin: 12.1 g/dL (ref 12.0–15.0)
MCH: 29.5 pg (ref 26.0–34.0)
MCHC: 32.8 g/dL (ref 30.0–36.0)
MCV: 90 fL (ref 80.0–100.0)
Platelets: 285 10*3/uL (ref 150–400)
RBC: 4.1 MIL/uL (ref 3.87–5.11)
RDW: 13.1 % (ref 11.5–15.5)
WBC: 9 10*3/uL (ref 4.0–10.5)
nRBC: 0.2 % (ref 0.0–0.2)

## 2023-08-27 LAB — BASIC METABOLIC PANEL WITH GFR
Anion gap: 11 (ref 5–15)
BUN: 22 mg/dL (ref 8–23)
CO2: 23 mmol/L (ref 22–32)
Calcium: 9.8 mg/dL (ref 8.9–10.3)
Chloride: 102 mmol/L (ref 98–111)
Creatinine, Ser: 0.88 mg/dL (ref 0.44–1.00)
GFR, Estimated: >60 mL/min (ref >60)
Glucose, Bld: 128 mg/dL — ABNORMAL HIGH (ref 70–99)
Potassium: 3.5 mmol/L (ref 3.5–5.1)
Sodium: 136 mmol/L (ref 135–145)

## 2023-08-27 LAB — TROPONIN I (HIGH SENSITIVITY): Troponin I (High Sensitivity): 3 ng/L (ref <18)

## 2023-08-27 MED ORDER — ASPIRIN 81 MG PO CHEW
324.0000 mg | CHEWABLE_TABLET | Freq: Once | ORAL | Status: AC
  Administered 2023-08-27: 324 mg via ORAL
  Filled 2023-08-27: qty 4

## 2023-08-27 MED ORDER — NITROGLYCERIN 0.4 MG SL SUBL
0.4000 mg | SUBLINGUAL_TABLET | SUBLINGUAL | Status: DC | PRN
  Administered 2023-08-27: 0.4 mg via SUBLINGUAL
  Filled 2023-08-27: qty 1

## 2023-08-27 NOTE — ED Triage Notes (Signed)
 PT complains of dizziness and lightheadedness started early today this evening just before dinner started to have mid sternal chest pain and difficulty taking a deep breath. Feels pressure in chest and burning pain in both arms. States pulse got up to 116.

## 2023-08-27 NOTE — ED Provider Notes (Signed)
 [place] EMERGENCY DEPARTMENT AT [facility] Provider Note   CSN: [id number] Arrival date & time: 08/27/23  2231     History {Add pertinent medical, surgical, social history, OB history to HPI:1} Chief Complaint  Patient presents with   Dizziness   Shortness of Breath   Chest Pain    Carrie Elliott is a 73 y.o. female.  The history is provided by the patient.  Dizziness Associated symptoms: chest pain and shortness of breath   Shortness of Breath Associated symptoms: chest pain   Chest Pain Associated symptoms: dizziness and shortness of breath   She has history of hypertension, GERD, benign positional vertigo and comes in with onset about 6 PM of a pressure feeling in her chest which radiates to her neck and arms.  She denies dyspnea, nausea, diaphoresis.  She also noted that her heart rate was significantly higher than it normally is.  Nothing makes this pain better, nothing makes it worse.  She had taken a dose of famotidine  without any benefit.  She is also complaining of feeling lightheaded.  This is different from her vertigo.  She is a non-smoker and denies history of hyperlipidemia or diabetes and there is no family history of premature coronary atherosclerosis.   Home Medications Prior to Admission medications   Medication Sig Start Date End Date Taking? Authorizing Provider  amLODipine  (NORVASC ) 2.5 MG tablet Take 1 tablet (2.5 mg total) by mouth daily. 08/22/23   Yacopino, Jessica L, NP  Cholecalciferol (VITAMIN D3) 50 MCG (2000 UT) TABS Take by mouth. Take 1 tablet daily Monday-Friday. Take 2 tablets on Saturday.    [provider]  dicyclomine  (BENTYL ) 10 MG capsule Take 1 capsule (10 mg total) by mouth 2 (two) times daily for 30 days Patient not taking: Reported on 08/22/2023 07/25/23     esomeprazole  (NEXIUM ) 40 MG capsule Take 1 capsule (40 mg total) by mouth 1/2 to 1 hour before morning meal every morning 07/25/23     famotidine  (PEPCID ) 20 MG tablet  Take 1 tablet (20 mg total) by mouth 2 (two) times daily for 90 days 07/25/23     fluticasone  (FLONASE ) 50 MCG/ACT nasal spray Place 2 sprays into both nostrils daily. 08/22/23   Jackqueline Mason, NP  Lidocaine -Hydrocortisone  Ace 3-0.5 % KIT Apply 1 Application topically daily as needed. 07/25/23     lisinopril  (ZESTRIL ) 10 MG tablet Take 1 tablet (10 mg total) by mouth 2 (two) times daily. 08/22/23   Yacopino, Jessica L, NP  metoprolol  succinate (TOPROL -XL) 50 MG 24 hr tablet Take 1-2 tablets (50-100 mg total) by mouth 2 (two) times daily with or immediately following a meal. 08/22/23   Jackqueline Mason, NP  SIMPLY SALINE NA Place 1 each into the nose daily.     [provider]      Allergies    Pseudoephedrine hcl er, Singulair [montelukast sodium], Cefdinir, Celebrex [celecoxib], Ciprofloxacin, and Codeine    Review of Systems   Review of Systems  Respiratory:  Positive for shortness of breath.   Cardiovascular:  Positive for chest pain.  Neurological:  Positive for dizziness.  All other systems reviewed and are negative.   Physical Exam Updated Vital Signs BP (!) 147/83 (BP Location: Left Arm)   Pulse (!) 103   Temp 99.3 F (37.4 C)   Resp 14   SpO2 97%  Physical Exam Vitals and nursing note reviewed.   73 year old female, resting comfortably and in no acute distress.  Vital signs are significant for borderline elevated heart rate and elevated systolic blood pressure.. Oxygen saturation is 97%, which is normal. Head is normocephalic and atraumatic. PERRLA, EOMI. Oropharynx is clear. Neck is nontender and supple without adenopathy or JVD. Back is nontender and there is no CVA tenderness. Lungs are clear without rales, wheezes, or rhonchi. Chest is nontender. Heart has regular rate and rhythm without murmur. Abdomen is soft, flat, nontender without masses or hepatosplenomegaly and peristalsis is normoactive. Extremities have no cyanosis or edema, full range of motion is  present. Skin is warm and dry without rash. Neurologic: Mental status is normal, cranial nerves are intact, there are no motor or sensory deficits.  ED Results / Procedures / Treatments   Labs (all labs ordered are listed, but only abnormal results are displayed) Labs Reviewed  CBC  BASIC METABOLIC PANEL WITH GFR  TROPONIN I (HIGH SENSITIVITY)    EKG EKG Interpretation Date/Time:  Tuesday August 27 2023 22:42:43 EDT Ventricular Rate:  102 PR Interval:  204 QRS Duration:  72 QT Interval:  334 QTC Calculation: 435 R Axis:   67  Text Interpretation: Sinus tachycardia Otherwise normal ECG When compared with ECG of 25-Apr-2013 06:06, HEART RATE has increased Confirmed by Alissa April ([id number]) on 08/27/2023 11:02:29 PM  Radiology DG Chest 2 View Result Date: 08/27/2023 CLINICAL DATA:  chest pain EXAM: CHEST - 2 VIEW COMPARISON:  05/31/2016. FINDINGS: Cardiac silhouette is unremarkable. No pneumothorax or pleural effusion. The lungs are clear. The visualized skeletal structures are unremarkable. IMPRESSION: No acute cardiopulmonary process. Electronically Signed   By: Sydell Eva M.D.   On: 08/27/2023 22:57    Procedures Procedures  Cardiac monitor shows normal sinus rhythm, per my interpretation.  Medications Ordered in ED Medications - No data to display  ED Course/ Medical Decision Making/ A&P   {   Click here for ABCD2, HEART and other calculatorsREFRESH Note before signing :1}                              Medical Decision Making Amount and/or Complexity of Data Reviewed Labs: ordered. Radiology: ordered.   Vague chest discomfort with tachycardia compared with her baseline.  This is a presentation which has a wide range of treatment options and involves a high risk of morbidity and complications.  Differential diagnosis includes, but is not limited to, ACS, pulmonary embolism, GERD.  I have reviewed her electrocardiogram, my interpretation is sinus tachycardia with rate  significantly increased compared with prior but no ST or T changes.  Chest x-ray shows no acute cardiopulmonary process.  Have independently viewed the images, and agree with radiologist's interpretation.  I have reviewed her initial laboratory tests, my interpretation is normal CBC.  Metabolic panel and troponin are pending and I am also adding a D-dimer to rule out pulmonary embolism.  I have also ordered a dose of aspirin and a therapeutic trial of nitroglycerin .  I have reviewed her past records, and note nuclear stress test on 01/20/2010 with no reversible ischemia or infarction and ejection fraction of 73%.  Stress test on 05/22/2013 was also unremarkable.  {Document critical care time when appropriate:1} {Document review of labs and clinical decision tools ie heart score, Chads2Vasc2 etc:1}  {Document your independent review of radiology images, and any outside records:1} {Document your discussion with family members, caretakers, and with consultants:1} {Document social determinants of health affecting pt's care:1} {Document your decision making why or why not  admission, treatments were needed:1} Final Clinical Impression(s) / ED Diagnoses Final diagnoses:  None    Rx / DC Orders ED Discharge Orders     None

## 2023-08-28 ENCOUNTER — Emergency Department (HOSPITAL_COMMUNITY)

## 2023-08-28 DIAGNOSIS — K449 Diaphragmatic hernia without obstruction or gangrene: Secondary | ICD-10-CM | POA: Diagnosis not present

## 2023-08-28 DIAGNOSIS — R079 Chest pain, unspecified: Secondary | ICD-10-CM | POA: Diagnosis not present

## 2023-08-28 DIAGNOSIS — D369 Benign neoplasm, unspecified site: Secondary | ICD-10-CM | POA: Diagnosis not present

## 2023-08-28 DIAGNOSIS — N281 Cyst of kidney, acquired: Secondary | ICD-10-CM | POA: Diagnosis not present

## 2023-08-28 LAB — D-DIMER, QUANTITATIVE: D-Dimer, Quant: 0.82 ug{FEU}/mL — ABNORMAL HIGH (ref 0.00–0.50)

## 2023-08-28 LAB — TROPONIN I (HIGH SENSITIVITY): Troponin I (High Sensitivity): 3 ng/L (ref <18)

## 2023-08-28 MED ORDER — IOHEXOL 350 MG/ML SOLN
75.0000 mL | Freq: Once | INTRAVENOUS | Status: AC | PRN
  Administered 2023-08-28: 75 mL via INTRAVENOUS

## 2023-08-28 NOTE — Discharge Instructions (Addendum)
 Your evaluation today did not show any sign of any heart problem and no sign of blood clot in your lung.  It is very possible your symptoms are related to your esophagus.  Please make a follow-up appointment with your gastroenterologist.  I am putting a request in to the cardiology office to see you regarding this chest pain.  With your rapid heart rate, they may want to do a heart monitor as an outpatient.  Return to the emergency department for any new or concerning symptoms.

## 2023-08-28 NOTE — ED Notes (Signed)
 Patient transported to CT

## 2023-08-29 ENCOUNTER — Encounter: Payer: Self-pay | Admitting: Family Medicine

## 2023-08-30 ENCOUNTER — Encounter: Payer: Self-pay | Admitting: Student

## 2023-08-30 ENCOUNTER — Ambulatory Visit (INDEPENDENT_AMBULATORY_CARE_PROVIDER_SITE_OTHER): Admitting: Student

## 2023-08-30 VITALS — BP 127/51 | HR 74 | Temp 98.5°F | Resp 12

## 2023-08-30 DIAGNOSIS — R079 Chest pain, unspecified: Secondary | ICD-10-CM

## 2023-08-30 DIAGNOSIS — I10 Essential (primary) hypertension: Secondary | ICD-10-CM | POA: Diagnosis not present

## 2023-08-30 DIAGNOSIS — R42 Dizziness and giddiness: Secondary | ICD-10-CM | POA: Insufficient documentation

## 2023-08-30 DIAGNOSIS — R0600 Dyspnea, unspecified: Secondary | ICD-10-CM | POA: Insufficient documentation

## 2023-08-30 NOTE — Assessment & Plan Note (Addendum)
 No active or acute chest pain during OV.  Discussed with patient that episode may be r/t GI or CV. She will follow-up with her GI. Referral sent to cardiology for further evaluation.

## 2023-08-30 NOTE — Progress Notes (Signed)
 Subjective:     Patient ID: Carrie Elliott, female    DOB: [birth year], 73 y.o.   MRN: [id number]  Chief Complaint  Patient presents with   ER follow up    Having nose bleeds     HPI Carrie Elliott is a 73 yo female presents for office visit following emergency department visit for nonspecific chest pain on 08/27/2023.  Presents to office visit with husband, Noah Baston.   Patient reports prior to ED visit she was doing errands when she felt epigastric discomfort, she took famotidine  tablet that did not alleviate.  Later on she felt discomfort in her chest and felt it was radiating to her neck and arms, at this point she presented to ED.  Chest X-ray and Chest CT were negative. EKG showed Sinus Tach, otherwise WNL.    Overall, she reports feeling well today. She states that her HR has generally remained within her normal range (60s-70s) throughout the day. However, she does experience episodes of increased HR (125 bpm or less) during periods of increased physical exertion, such as while performing more strenuous household tasks and cleaning home. She monitors with smart watch. These episodes are accompanied by mild SOB, with increased activity level. Today she denies CP, palpitations, and syncope. Patient denies fever, chills, palpitations, dyspnea, edema, HA, vision changes, N/V/D, urinary symptoms, rash, weight changes.  Hypertension Amlodipine  2.5 mg daily, lisinopril  10 mg daily, metoprolol  (Toprol -XL) 50 mg 24-hour tablet daily.  Compliant with medication.  Reports no adverse Ses She has been monitoring her BP at home. BP has been well controlled. Systolic 101-120s, Diastolic  40-98J.      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   Osteopenia 10/09/2015   Sternal fracture    1986   Vitamin D  deficiency 04/06/2016   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    Past Surgical History:  Procedure Laterality Date   APPENDECTOMY  1989   atypical nevus excision     BREAST BIOPSY Right    CHOLECYSTECTOMY  1989   COLONOSCOPY WITH PROPOFOL  N/A 05/27/2015   Procedure: COLONOSCOPY WITH PROPOFOL ;  Surgeon: Ozell Blunt, MD;  Location: [facility];  Service: Endoscopy;  Laterality: N/A;   CYSTOSCOPY W/ URETERAL STENT REMOVAL  1999   ESOPHAGOGASTRODUODENOSCOPY  2011   with esophageal stent, perforation    ESOPHAGOGASTRODUODENOSCOPY (EGD) WITH PROPOFOL  N/A 05/27/2015   Procedure: ESOPHAGOGASTRODUODENOSCOPY (EGD) WITH PROPOFOL ;  Surgeon: Ozell Blunt, MD;  Location: [facility];  Service: Endoscopy;  Laterality: N/A;   HEMORRHOID SURGERY  2005   LEFT OOPHORECTOMY  1999   complicated by ureteral injuery requiring a ureteral stent    MMK / ANTERIOR VESICOURETHROPEXY / URETHROPEXY  2000   TONSILLECTOMY AND ADENOIDECTOMY     TOTAL ABDOMINAL HYSTERECTOMY  2000   with USO   TUBAL LIGATION  1981   wisdom teeth removal  1964  Family History  Problem Relation Age of Onset   Colon polyps Father    Hypertension Father    Heart failure Father    Melanoma Father        multiple   Heart attack Father        x2   Heart disease Father        Bundle Block   Meniere's disease Father    Cancer Father        melanoma   Osteoporosis Mother    Transient ischemic attack Mother    Cancer Mother        colon   Heart disease Mother        afib   Hyperlipidemia Mother    Diabetes Paternal Grandmother    Stroke Maternal Grandmother    Cancer Maternal Grandmother        skin, melanoma, sarcoma    Colon polyps Maternal Grandmother    Diverticulitis Maternal Grandmother    Stroke Maternal Grandfather    Heart disease Maternal Grandfather    Obesity Brother     Social History   Socioeconomic History   Marital status: Married    Spouse name: Not on file   Number of children: Not on file   Years of education: Not on file   Highest education level: Not on file  Occupational History   Not on file  Tobacco Use   Smoking status: Never   Smokeless tobacco: Never  Vaping Use   Vaping status: Never Used  Substance and Sexual Activity   Alcohol use: No   Drug use: No   Sexual activity: Yes    Birth control/protection: Surgical    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, avoids spicy  Other Topics Concern   Not on file  Social History Narrative   6 grand kids   Daughter and son in [place]-    Social Drivers of Health   Financial Resource Strain: Low Risk  (02/21/2021)   Overall Financial Resource Strain (CARDIA)    Difficulty of Paying Living Expenses: Not hard at all  Food Insecurity: No Food Insecurity (02/21/2022)   Hunger Vital Sign    Worried About Running Out of Food in the Last Year: Never true    Ran Out of Food in the Last Year: Never true  Transportation Needs: No Transportation Needs (02/21/2022)   PRAPARE - Administrator, Civil Service (Medical): No    Lack of Transportation (Non-Medical): No  Physical Activity: Inactive (02/21/2021)   Exercise Vital Sign    Days of Exercise per Week: 0 days    Minutes of Exercise per Session: 0 min  Stress: No Stress Concern Present (02/21/2021)   [employer] of Occupational Health - Occupational Stress Questionnaire    Feeling of Stress : Not at all  Social Connections: Socially Integrated (02/21/2021)   Social Connection and Isolation Panel [NHANES]    Frequency of Communication with Friends and Family: More than three times a week    Frequency of Social Gatherings with Friends and  Family: More than three times a week    Attends Religious Services: More than 4 times per year    Active Member of [employer] or Organizations: Yes    Attends Banker Meetings: More than 4 times per year    Marital Status: Married  Catering manager Violence: Not At Risk (02/21/2022)   Humiliation, Afraid, Rape, and Kick questionnaire    Fear of Current or Ex-Partner: No  Emotionally Abused: No    Physically Abused: No    Sexually Abused: No    Outpatient Medications Prior to Visit  Medication Sig Dispense Refill   amLODipine  (NORVASC ) 2.5 MG tablet Take 1 tablet (2.5 mg total) by mouth daily. 90 tablet 0   Cholecalciferol (VITAMIN D3) 50 MCG (2000 UT) TABS Take by mouth. Take 1 tablet daily Monday-Friday. Take 2 tablets on Saturday.     esomeprazole  (NEXIUM ) 40 MG capsule Take 1 capsule (40 mg total) by mouth 1/2 to 1 hour before morning meal every morning 90 capsule 3   famotidine  (PEPCID ) 20 MG tablet Take 1 tablet (20 mg total) by mouth 2 (two) times daily for 90 days 180 tablet 3   fluticasone  (FLONASE ) 50 MCG/ACT nasal spray Place 2 sprays into both nostrils daily. 16 g 6   Lidocaine -Hydrocortisone  Ace 3-0.5 % KIT Apply 1 Application topically daily as needed. 1 kit 2   lisinopril  (ZESTRIL ) 10 MG tablet Take 1 tablet (10 mg total) by mouth 2 (two) times daily. 180 tablet 0   metoprolol  succinate (TOPROL -XL) 50 MG 24 hr tablet Take 1-2 tablets (50-100 mg total) by mouth 2 (two) times daily with or immediately following a meal. 360 tablet 0   Probiotic Product (PROBIOTIC-10 PO) Take 1 capsule by mouth daily. 25 billion     SIMPLY SALINE NA Place 1 each into the nose daily.      dicyclomine  (BENTYL ) 10 MG capsule Take 1 capsule (10 mg total) by mouth 2 (two) times daily for 30 days (Patient not taking: Reported on 08/22/2023) 60 capsule 0   No facility-administered medications prior to visit.    Allergies  Allergen Reactions   Pseudoephedrine Hcl Er Other (See Comments)     TACHYCARDIA    Singulair [Montelukast Sodium] Other (See Comments)    Mental status changes, irritable   Cefdinir Hives   Celebrex [Celecoxib] Other (See Comments)    ABDOMINAL PAIN    Ciprofloxacin Other (See Comments)    ABDOMINAL PAIN    Codeine Nausea And Vomiting    NAUSEA VOMITING - can take with zofran     ROS    See HPI Objective:     Physical Exam General: No acute distress. Awake and conversant.  Eyes: Normal conjunctiva, anicteric. Round symmetric pupils. Neck: Neck is supple. No JVD. Respiratory: CTAB, Respirations are non-labored. No wheezing.  Skin: Warm, Dry. No rashes or ulcers.  Psych: Alert and oriented. Cooperative, Appropriate mood and affect, Normal judgment.+anxiety CV: RRR. No murmur. No lower extremity edema.  MSK: Normal ambulation. No clubbing or cyanosis.  Neuro: CN II-XII grossly normal.    BP (!) 127/51 (BP Location: Right Arm, Patient Position: Sitting, Cuff Size: Normal)   Pulse 74   Temp 98.5 F (36.9 C) (Oral)   Resp 12   SpO2 100%  Wt Readings from Last 3 Encounters:  08/22/23 176 lb 8 oz (80.1 kg)  04/23/23 178 lb (80.7 kg)  02/14/23 177 lb 3.2 oz (80.4 kg)       Assessment & Plan:   Problem List Items Addressed This Visit     Chest pain - Primary   No active or acute chest pain during OV.  Discussed with patient that episode may be r/t GI or CV. She will follow-up with her GI. Referral sent to cardiology for further evaluation.      Relevant Orders   Ambulatory referral to Cardiology   Dizziness   Relevant Orders   Ambulatory referral to Cardiology  HTN (hypertension) (Chronic)   On amlodipine  2.5 mg daily, lisinopril  10 mg daily, metoprolol  (Toprol -XL) 50 mg 24-hour tablet daily.  She is compliant with medication and reports no adverse side effects.  BP well controlled, no changes to meds.  Continue exercise as tolerated. Pt expressed understanding of the importance of maintaining a healthy weight and BMI for supporting BP  control and promoting overall health and well-being.        I have discontinued Francesco Inks. Ostroff's dicyclomine . I am also having her maintain her SIMPLY SALINE NA, Vitamin D3, famotidine , esomeprazole , Lidocaine -Hydrocortisone  Ace, amLODipine , fluticasone , lisinopril , metoprolol  succinate, and Probiotic Product (PROBIOTIC-10 PO).  No orders of the defined types were placed in this encounter.

## 2023-08-30 NOTE — Assessment & Plan Note (Addendum)
 On amlodipine  2.5 mg daily, lisinopril  10 mg daily, metoprolol  (Toprol -XL) 50 mg 24-hour tablet daily.  She is compliant with medication and reports no adverse side effects.  BP well controlled, no changes to meds.  Continue exercise as tolerated. Pt expressed understanding of the importance of maintaining a healthy weight and BMI for supporting BP control and promoting overall health and well-being.

## 2023-09-05 ENCOUNTER — Encounter: Payer: Self-pay | Admitting: Family Medicine

## 2023-09-05 ENCOUNTER — Other Ambulatory Visit: Payer: Self-pay | Admitting: Family Medicine

## 2023-09-05 ENCOUNTER — Telehealth: Payer: Self-pay

## 2023-09-05 DIAGNOSIS — R040 Epistaxis: Secondary | ICD-10-CM

## 2023-09-05 NOTE — Telephone Encounter (Signed)
 Spoke with pt regarding needing a new pt appointment with Dr. Lavonne Prairie. Pt's husband is a pt of Dr. Lavonne Prairie, she would like to also see him. Scheduled pt to see Dr. Lavonne Prairie. Pt verbalizes understanding.

## 2023-09-11 ENCOUNTER — Encounter (INDEPENDENT_AMBULATORY_CARE_PROVIDER_SITE_OTHER): Payer: Self-pay

## 2023-09-12 DIAGNOSIS — R072 Precordial pain: Secondary | ICD-10-CM | POA: Insufficient documentation

## 2023-09-12 NOTE — Progress Notes (Unsigned)
  Cardiology Office Note:   Date:  09/12/2023  ID:  Carrie Elliott, DOB 03-Jan-1951, MRN [id number] PCP: Neda Balk, MD  [facility] Providers Cardiologist:  None {  History of Present Illness:   Carrie Elliott is a 73 y.o. female evaluation of chest pain, SOB and dizziness.  She saw Dr. Felipe Horton in 2015 for chest pain.  ***   ROS: ***  Studies Reviewed:    EKG:       ***  Risk Assessment/Calculations:   {Does this patient have ATRIAL FIBRILLATION?[phone]} No BP recorded.  {Refresh Note OR Click here to enter BP  :1}***        Physical Exam:   VS:  There were no vitals taken for this visit.   Wt Readings from Last 3 Encounters:  08/22/23 176 lb 8 oz (80.1 kg)  04/23/23 178 lb (80.7 kg)  02/14/23 177 lb 3.2 oz (80.4 kg)     GEN: Well nourished, well developed in no acute distress NECK: No JVD; No carotid bruits CARDIAC: ***RR, *** murmurs, rubs, gallops RESPIRATORY:  Clear to auscultation without rales, wheezing or rhonchi  ABDOMEN: Soft, non-tender, non-distended EXTREMITIES:  No edema; No deformity   ASSESSMENT AND PLAN:   *** Chest pain:  ***  SOB:  ***     Follow up ***  Signed, Eilleen Grates, MD

## 2023-09-13 ENCOUNTER — Encounter: Payer: Self-pay | Admitting: Cardiology

## 2023-09-13 ENCOUNTER — Ambulatory Visit: Attending: Cardiology | Admitting: Cardiology

## 2023-09-13 VITALS — BP 123/79 | HR 81 | Ht 60.5 in | Wt 170.1 lb

## 2023-09-13 DIAGNOSIS — R0602 Shortness of breath: Secondary | ICD-10-CM | POA: Insufficient documentation

## 2023-09-13 DIAGNOSIS — I429 Cardiomyopathy, unspecified: Secondary | ICD-10-CM

## 2023-09-13 DIAGNOSIS — R072 Precordial pain: Secondary | ICD-10-CM | POA: Insufficient documentation

## 2023-09-13 NOTE — Patient Instructions (Signed)
 Medication Instructions:  Your physician recommends that you continue on your current medications as directed. Please refer to the Current Medication list given to you today.  *If you need a refill on your cardiac medications before your next appointment, please call your pharmacy*  Lab Work: NONE If you have labs (blood work) drawn today and your tests are completely normal, you will receive your results only by: MyChart Message (if you have MyChart) OR A paper copy in the mail If you have any lab test that is abnormal or we need to change your treatment, we will call you to review the results.  Testing/Procedures: Echo Your physician has requested that you have an echocardiogram. Echocardiography is a painless test that uses sound waves to create images of your heart. It provides your doctor with information about the size and shape of your heart and how well your heart's chambers and valves are working. This procedure takes approximately one hour. There are no restrictions for this procedure. Please do NOT wear cologne, perfume, aftershave, or lotions (deodorant is allowed). Please arrive 15 minutes prior to your appointment time.  Please note: We ask at that you not bring children with you during ultrasound (echo/ vascular) testing. Due to room size and safety concerns, children are not allowed in the ultrasound rooms during exams. Our front office staff cannot provide observation of children in our lobby area while testing is being conducted. An adult accompanying a patient to their appointment will only be allowed in the ultrasound room at the discretion of the ultrasound technician under special circumstances. We apologize for any inconvenience.   Follow-Up: At [facility], you and your health needs are our priority.  As part of our continuing mission to provide you with exceptional heart care, our providers are all part of one team.  This team includes your primary  Cardiologist (physician) and Advanced Practice Providers or APPs (Physician Assistants and Nurse Practitioners) who all work together to provide you with the care you need, when you need it.  Your next appointment:   As needed  Provider:   Lavonne Prairie, MD  We recommend signing up for the patient portal called MyChart.  Sign up information is provided on this After Visit Summary.  MyChart is used to connect with patients for Virtual Visits (Telemedicine).  Patients are able to view lab/test results, encounter notes, upcoming appointments, etc.  Non-urgent messages can be sent to your provider as well.   To learn more about what you can do with MyChart, go to [email or web address].

## 2023-09-17 ENCOUNTER — Other Ambulatory Visit (HOSPITAL_BASED_OUTPATIENT_CLINIC_OR_DEPARTMENT_OTHER): Payer: Self-pay

## 2023-09-17 ENCOUNTER — Other Ambulatory Visit (HOSPITAL_COMMUNITY): Payer: Self-pay

## 2023-09-17 NOTE — Progress Notes (Addendum)
 Subjective:     Patient ID: Carrie Elliott, female    DOB: [birth year], 73 y.o.   MRN: [id number]  Chief Complaint  Patient presents with   Follow-up    BP check    Ankle Pain    L ankle pain, swelling    HPI Carrie Elliott is a 73 y.o. female who presents for follow up on chronic conditions. She is followed by GI, Cardiology, and Urology.  Recently seen by Cardiology-Dr. Burney for Echo in August.  Hypertension Amlodipine  2.5 mg daily, lisinopril  10 mg twice daily, metoprolol  succinate 50 mg twice daily. Compliant with medications.  Taking BP at home and readings are well-controlled.  GERD: -Meds: On omeprazole (Nexium ) 40 mg daily and famotidine  (Pepcid ) 20 mg twice daily. Followed by GI: Dr. Caffie Ee [facility].  Colonoscopy and Endoscopy scheduled November 2025 -Compliant: yes -Reports symptoms are controlled current medications  Ankle Pain: Patient c/o of left ankle pain.  Onset of the symptoms was 4 weeks ago. Inciting event: none known. Current symptoms include swelling and pain in certain positions with movement.  Patient has had no prior ankle problems. Previous visits for this problem: none.  Evaluation to date: none.     Patient denies fever, chills, SOB, CP, palpitations, syncope, dyspnea, edema, HA, vision changes, N/V/D, abdominal pain, urinary symptoms, rash, weight changes, and recent illness or hospitalizations.       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   Osteopenia 10/09/2015   Sleep apnea    CPAP   Sternal fracture    1986   Vitamin D  deficiency 04/06/2016   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    Past Surgical History:  Procedure Laterality Date   APPENDECTOMY  1989   atypical nevus excision     BREAST BIOPSY Right    CHOLECYSTECTOMY  1989   COLONOSCOPY WITH PROPOFOL  N/A 05/27/2015   Procedure: COLONOSCOPY WITH PROPOFOL ;  Surgeon: Oliva Boots, MD;  Location: [facility];  Service: Endoscopy;  Laterality: N/A;   CYSTOSCOPY W/ URETERAL STENT REMOVAL  1999   ESOPHAGOGASTRODUODENOSCOPY  2011   with esophageal stent, perforation    ESOPHAGOGASTRODUODENOSCOPY (EGD) WITH PROPOFOL  N/A 05/27/2015   Procedure: ESOPHAGOGASTRODUODENOSCOPY (EGD) WITH PROPOFOL ;  Surgeon: Oliva Boots, MD;  Location: [facility];  Service: Endoscopy;  Laterality: N/A;   HEMORRHOID SURGERY  2005   LEFT OOPHORECTOMY  1999   complicated by ureteral injuery requiring a ureteral stent    MMK / ANTERIOR VESICOURETHROPEXY / URETHROPEXY  2000   TONSILLECTOMY AND ADENOIDECTOMY     TOTAL ABDOMINAL HYSTERECTOMY  2000   with USO   TUBAL LIGATION  1981   wisdom teeth removal  1964    Family History  Problem Relation Age of Onset   Osteoporosis Mother    Transient ischemic attack Mother    Cancer Mother        colon  Heart disease Mother        afib   Hyperlipidemia Mother    Colon polyps Father    Hypertension Father    Heart failure Father    Melanoma Father        multiple   Heart attack Father 74       x2   Heart disease Father        Bundle Block   Meniere's disease Father    Cancer Father        melanoma   Obesity Brother    Stroke Maternal Grandmother    Cancer Maternal Grandmother        skin, melanoma, sarcoma   Colon polyps Maternal Grandmother    Diverticulitis Maternal Grandmother    Stroke Maternal Grandfather    Heart disease Maternal Grandfather    Diabetes Paternal Grandmother     Social History   Socioeconomic History    Marital status: Married    Spouse name: Not on file   Number of children: Not on file   Years of education: Not on file   Highest education level: Not on file  Occupational History   Not on file  Tobacco Use   Smoking status: Never   Smokeless tobacco: Never  Vaping Use   Vaping status: Never Used  Substance and Sexual Activity   Alcohol use: No   Drug use: No   Sexual activity: Yes    Birth control/protection: Surgical    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, avoids spicy  Other Topics Concern   Not on file  Social History Narrative   6 grand kids   Daughter and son in [place]-    Social Drivers of Health   Financial Resource Strain: Low Risk  (02/21/2021)   Overall Financial Resource Strain (CARDIA)    Difficulty of Paying Living Expenses: Not hard at all  Food Insecurity: No Food Insecurity (02/21/2022)   Hunger Vital Sign    Worried About Running Out of Food in the Last Year: Never true    Ran Out of Food in the Last Year: Never true  Transportation Needs: No Transportation Needs (02/21/2022)   PRAPARE - Administrator, Civil Service (Medical): No    Lack of Transportation (Non-Medical): No  Physical Activity: Inactive (02/21/2021)   Exercise Vital Sign    Days of Exercise per Week: 0 days    Minutes of Exercise per Session: 0 min  Stress: No Stress Concern Present (02/21/2021)   [employer] of Occupational Health - Occupational Stress Questionnaire    Feeling of Stress : Not at all  Social Connections: Socially Integrated (02/21/2021)   Social Connection and Isolation Panel    Frequency of Communication with Friends and Family: More than three times a week    Frequency of Social Gatherings with Friends and Family: More than three times a week    Attends Religious Services: More than 4 times per year    Active Member of [employer] or Organizations: Yes    Attends Engineer, structural: More than 4 times per year    Marital  Status: Married  Catering manager Violence: Not At Risk (02/21/2022)   Humiliation, Afraid, Rape, and Kick questionnaire    Fear of Current or Ex-Partner: No    Emotionally Abused: No    Physically Abused: No    Sexually Abused: No    Outpatient Medications Prior to Visit  Medication Sig Dispense Refill   Calcium Citrate (CAL-CITRATE PO)  Take 2 tablets by mouth 3 (three) times a week.     Cholecalciferol (VITAMIN D3) 50 MCG (2000 UT) TABS Take by mouth. Take 1 tablet daily Monday-Friday. Take 2 tablets on Saturday.     esomeprazole  (NEXIUM ) 40 MG capsule Take 1 capsule (40 mg total) by mouth 1/2 to 1 hour before morning meal every morning 90 capsule 3   famotidine  (PEPCID ) 20 MG tablet Take 1 tablet (20 mg total) by mouth 2 (two) times daily for 90 days 180 tablet 3   fluticasone  (FLONASE ) 50 MCG/ACT nasal spray Place 2 sprays into both nostrils daily. 16 g 6   Lidocaine -Hydrocortisone  Ace 3-0.5 % KIT Apply 1 Application topically daily as needed. 1 kit 2   lisinopril  (ZESTRIL ) 10 MG tablet Take 1 tablet (10 mg total) by mouth 2 (two) times daily. 180 tablet 0   metoprolol  succinate (TOPROL -XL) 50 MG 24 hr tablet Take 1-2 tablets (50-100 mg total) by mouth 2 (two) times daily with or immediately following a meal. 360 tablet 0   Probiotic Product (PROBIOTIC-10 PO) Take 1 capsule by mouth daily. 25 billion     SIMPLY SALINE NA Place 1 each into the nose daily.      amLODipine  (NORVASC ) 2.5 MG tablet Take 1 tablet (2.5 mg total) by mouth daily. 90 tablet 0   No facility-administered medications prior to visit.    Allergies  Allergen Reactions   Pseudoephedrine Hcl Er Other (See Comments)    TACHYCARDIA    Singulair [Montelukast Sodium] Other (See Comments)    Mental status changes, irritable   Cefdinir Hives   Celebrex [Celecoxib] Other (See Comments)    ABDOMINAL PAIN    Ciprofloxacin Other (See Comments)    ABDOMINAL PAIN    Codeine Nausea And Vomiting    NAUSEA VOMITING - can  take with zofran     ROS    See HPI Objective:     Physical Exam  General: No acute distress. Awake and conversant.  Respiratory: CTAB. Respirations are non-labored. No wheezing.  Skin: Warm. Dry. Intact  Psych: Alert and oriented. Cooperative, Appropriate mood and affect, Normal judgment.  CV: RRR. No lower extremity edema.  MSK: Normal ambulation. No clubbing or cyanosis. +L Ankle edema +1, slight limited ROM in inversion,eversion,flexion,extension, TWP  lateral malleolus. No ecchymosis or erythema. Skin dry,intact.  Neuro: CN II-XII grossly normal.     BP (!) 119/55   Pulse 80   Temp 98.1 F (36.7 C) (Oral)   Resp 16   Ht 5' 0.5 (1.537 m)   Wt 174 lb 6 oz (79.1 kg)   SpO2 100%   BMI 33.49 kg/m  Wt Readings from Last 3 Encounters:  09/19/23 174 lb 6 oz (79.1 kg)  09/13/23 170 lb 1.6 oz (77.2 kg)  08/22/23 176 lb 8 oz (80.1 kg)       Assessment & Plan:   Problem List Items Addressed This Visit     Acute left ankle pain   XR negative for fracture.  Calcaneal heel bone spur present.  Referall to podiatry for further evaluation.  Supportive shoes, heel inserts, daily stretching, and OTC anti-inflammatories can help reduce pain.  May ice for symptom relief-20 minutes at a time, place towel between ice pack and skin.       Relevant Orders   DG Ankle Complete Left (Completed)   HTN (hypertension) - Primary (Chronic)   Well controlled, no changes to meds. Encouraged heart healthy diet such as the DASH diet, and exercise  as tolerated.         Relevant Medications   amLODipine  (NORVASC ) 2.5 MG tablet   Patient was educated on the diagnosis, treatment options, potential risks, benefits, and alternatives. All questions were addressed. Patient verbalized understanding and agrees with the plan of care. Will follow up as advised or sooner if symptoms worsen or new concerns arise.   Portions of this note were dictated using DRAGON voice recognition software. Please  disregard any errors in transcription.    I am having Carrie RAMAN. Runde maintain her SIMPLY SALINE NA, Vitamin D3, famotidine , esomeprazole , Lidocaine -Hydrocortisone  Ace, fluticasone , lisinopril , metoprolol  succinate, Probiotic Product (PROBIOTIC-10 PO), Calcium Citrate (CAL-CITRATE PO), and amLODipine .  Meds ordered this encounter  Medications   amLODipine  (NORVASC ) 2.5 MG tablet    Sig: Take 1 tablet (2.5 mg total) by mouth daily.    Dispense:  90 tablet    Refill:  1    Supervising Provider:   DOMENICA BLACKBIRD A [[id number]]

## 2023-09-18 DIAGNOSIS — K2100 Gastro-esophageal reflux disease with esophagitis, without bleeding: Secondary | ICD-10-CM | POA: Diagnosis not present

## 2023-09-18 DIAGNOSIS — R0789 Other chest pain: Secondary | ICD-10-CM | POA: Diagnosis not present

## 2023-09-18 DIAGNOSIS — R1084 Generalized abdominal pain: Secondary | ICD-10-CM | POA: Diagnosis not present

## 2023-09-19 ENCOUNTER — Telehealth: Admitting: Student

## 2023-09-19 ENCOUNTER — Other Ambulatory Visit (HOSPITAL_BASED_OUTPATIENT_CLINIC_OR_DEPARTMENT_OTHER): Payer: Self-pay

## 2023-09-19 ENCOUNTER — Ambulatory Visit (HOSPITAL_BASED_OUTPATIENT_CLINIC_OR_DEPARTMENT_OTHER)
Admission: RE | Admit: 2023-09-19 | Discharge: 2023-09-19 | Disposition: A | Discharge status: Home / Self Care | Source: Ambulatory Visit | Attending: Student | Admitting: Student

## 2023-09-19 ENCOUNTER — Encounter (INDEPENDENT_AMBULATORY_CARE_PROVIDER_SITE_OTHER): Payer: Self-pay

## 2023-09-19 ENCOUNTER — Encounter: Payer: Self-pay | Admitting: Student

## 2023-09-19 ENCOUNTER — Ambulatory Visit (INDEPENDENT_AMBULATORY_CARE_PROVIDER_SITE_OTHER): Admitting: Student

## 2023-09-19 VITALS — BP 119/55 | HR 80 | Temp 98.1°F | Resp 16 | Ht 60.5 in | Wt 174.4 lb

## 2023-09-19 DIAGNOSIS — M25572 Pain in left ankle and joints of left foot: Secondary | ICD-10-CM | POA: Insufficient documentation

## 2023-09-19 DIAGNOSIS — M7989 Other specified soft tissue disorders: Secondary | ICD-10-CM | POA: Diagnosis not present

## 2023-09-19 DIAGNOSIS — R040 Epistaxis: Secondary | ICD-10-CM

## 2023-09-19 DIAGNOSIS — I10 Essential (primary) hypertension: Secondary | ICD-10-CM

## 2023-09-19 MED ORDER — AMLODIPINE BESYLATE 2.5 MG PO TABS
2.5000 mg | ORAL_TABLET | Freq: Every day | ORAL | 1 refills | Status: DC
  Filled 2023-09-19 – 2023-10-29 (×2): qty 90, 90d supply, fill #0

## 2023-09-19 NOTE — Assessment & Plan Note (Signed)
 Well controlled, no changes to meds. Encouraged heart healthy diet such as the DASH diet, and exercise as tolerated.

## 2023-09-19 NOTE — Assessment & Plan Note (Addendum)
 XR negative for fracture.  Calcaneal heel bone spur present.  Referall to podiatry for further evaluation.  Supportive shoes, heel inserts, daily stretching, and OTC anti-inflammatories can help reduce pain.  May ice for symptom relief-20 minutes at a time, place towel between ice pack and skin.

## 2023-09-20 ENCOUNTER — Encounter: Payer: Self-pay | Admitting: Family

## 2023-09-20 ENCOUNTER — Ambulatory Visit: Payer: Self-pay | Admitting: Student

## 2023-09-20 DIAGNOSIS — M7752 Other enthesopathy of left foot: Secondary | ICD-10-CM | POA: Insufficient documentation

## 2023-09-30 ENCOUNTER — Ambulatory Visit (INDEPENDENT_AMBULATORY_CARE_PROVIDER_SITE_OTHER): Admitting: Family Medicine

## 2023-09-30 ENCOUNTER — Encounter: Payer: Self-pay | Admitting: Family Medicine

## 2023-09-30 ENCOUNTER — Ambulatory Visit (HOSPITAL_BASED_OUTPATIENT_CLINIC_OR_DEPARTMENT_OTHER)
Admission: RE | Admit: 2023-09-30 | Discharge: 2023-09-30 | Disposition: A | Discharge status: Home / Self Care | Source: Ambulatory Visit | Attending: Family Medicine | Admitting: Family Medicine

## 2023-09-30 ENCOUNTER — Other Ambulatory Visit (HOSPITAL_BASED_OUTPATIENT_CLINIC_OR_DEPARTMENT_OTHER): Payer: Self-pay

## 2023-09-30 ENCOUNTER — Ambulatory Visit: Payer: Self-pay | Admitting: Family Medicine

## 2023-09-30 ENCOUNTER — Other Ambulatory Visit: Payer: Self-pay

## 2023-09-30 VITALS — BP 127/72 | HR 80 | Ht 60.5 in | Wt 174.0 lb

## 2023-09-30 DIAGNOSIS — R600 Localized edema: Secondary | ICD-10-CM | POA: Diagnosis not present

## 2023-09-30 DIAGNOSIS — M7989 Other specified soft tissue disorders: Secondary | ICD-10-CM | POA: Diagnosis not present

## 2023-09-30 DIAGNOSIS — M79662 Pain in left lower leg: Secondary | ICD-10-CM | POA: Diagnosis not present

## 2023-09-30 DIAGNOSIS — M79605 Pain in left leg: Secondary | ICD-10-CM | POA: Diagnosis not present

## 2023-09-30 DIAGNOSIS — M109 Gout, unspecified: Secondary | ICD-10-CM

## 2023-09-30 LAB — COMPREHENSIVE METABOLIC PANEL WITH GFR
ALT: 23 U/L (ref 0–35)
AST: 28 U/L (ref 0–37)
Albumin: 4.3 g/dL (ref 3.5–5.2)
Alkaline Phosphatase: 47 U/L (ref 39–117)
BUN: 13 mg/dL (ref 6–23)
CO2: 27 meq/L (ref 19–32)
Calcium: 9.6 mg/dL (ref 8.4–10.5)
Chloride: 102 meq/L (ref 96–112)
Creatinine, Ser: 0.8 mg/dL (ref 0.40–1.20)
GFR: 73.3 mL/min (ref >60.00)
Glucose, Bld: 93 mg/dL (ref 70–99)
Potassium: 4.7 meq/L (ref 3.5–5.1)
Sodium: 136 meq/L (ref 135–145)
Total Bilirubin: 0.5 mg/dL (ref 0.2–1.2)
Total Protein: 7.1 g/dL (ref 6.0–8.3)

## 2023-09-30 NOTE — Progress Notes (Signed)
 Acute Office Visit  Subjective:     Patient ID: Carrie Elliott, female    DOB: [birth year], 73 y.o.   MRN: [id number]  Chief Complaint  Patient presents with   Leg Swelling    HPI Patient is in today for left leg pain/swelling.   Discussed the use of AI scribe software for clinical note transcription with the patient, who gave verbal consent to proceed.  History of Present Illness Carrie Elliott is a 73 year old female who presents with persistent left leg swelling and pain.  She has been experiencing persistent swelling and pain in her left leg since Aug 22, 2023. The swelling extends from her toes to her groin and is accompanied by discoloration. The pain is significant, particularly in the ankle and knee areas, and worsens by the end of the day but remains present in the morning. Elevating her leg provides limited relief.  Her medical history includes low sodium levels, previously managed by adjusting fluid intake. Recent lab work showed normal sodium and potassium levels. Although gout was considered due to her symptoms, she has not been diagnosed with it. Previous x-rays of her foot and ankle revealed a heel spur. She is awaiting an ortho appointment.   No recent changes in salt intake. She is not currently on any blood thinners and avoids aspirin  due to gastrointestinal issues. Patient denies any chest pain, palpitations, dyspnea, wheezing, recurrent headaches, vision changes.       ROS All review of systems negative except what is listed in the HPI      Objective:    BP 127/72   Pulse 80   Ht 5' 0.5 (1.537 m)   Wt 174 lb (78.9 kg)   SpO2 99%   BMI 33.42 kg/m    Physical Exam Vitals reviewed.  Constitutional:      Appearance: Normal appearance.  Cardiovascular:     Rate and Rhythm: Normal rate and regular rhythm.     Pulses: Normal pulses.  Pulmonary:     Effort: Pulmonary effort is normal.     Breath sounds: Normal breath sounds.  Musculoskeletal:      Left lower leg: 1+ Edema present.  Skin:    General: Skin is warm and dry.     Comments: Minimal erythema/petechia to left medial ankle, no generalized erythema or streaking, no heat; mild calf discomfort with palpation and Homan's testing  Neurological:     Mental Status: She is alert and oriented to person, place, and time.  Psychiatric:        Mood and Affect: Mood normal.        Behavior: Behavior normal.        Thought Content: Thought content normal.        Judgment: Judgment normal.        Results for orders placed or performed in visit on 09/30/23  Comprehensive metabolic panel with GFR  Result Value Ref Range   Sodium 136 135 - 145 mEq/L   Potassium 4.7 3.5 - 5.1 mEq/L   Chloride 102 96 - 112 mEq/L   CO2 27 19 - 32 mEq/L   Glucose, Bld 93 70 - 99 mg/dL   BUN 13 6 - 23 mg/dL   Creatinine, Ser 9.19 0.40 - 1.20 mg/dL   Total Bilirubin 0.5 0.2 - 1.2 mg/dL   Alkaline Phosphatase 47 39 - 117 U/L   AST 28 0 - 37 U/L   ALT 23 0 - 35 U/L   Total Protein  7.1 6.0 - 8.3 g/dL   Albumin 4.3 3.5 - 5.2 g/dL   GFR 26.69 >39.99 mL/min   Calcium 9.6 8.4 - 10.5 mg/dL        Assessment & Plan:   Problem List Items Addressed This Visit   None Visit Diagnoses       Pain and swelling of left lower leg    -  Primary   Relevant Orders   US  Venous Img Lower Unilateral Left (DVT)   Uric acid       Assessment & Plan Left leg swelling and pain Persistent swelling and pain from left ankle to groin. Differential includes DVT, arthritis, venous insufficiency. Positive D-dimer, CT negative for PE at ED visit 1 month ago. Orthopedic and cardiology follow-ups scheduled. - Order stat ultrasound of the left leg to rule out DVT. - Advise wearing knee-high compression socks if ultrasound is normal. - Instruct to elevate legs to heart level when seated. - Consider vascular referral if ultrasound is normal and symptoms persist.  Patient concerned about possible gout No prior diagnosis.  Symptoms atypical for gout. Uric acid levels more accurate post-symptom resolution. - Order future uric acid level test two weeks after symptoms settle.  Hyponatremia Previous low sodium episodes, currently normal at 136 mmol/L. Advised to reduce water intake.         No orders of the defined types were placed in this encounter.   Return for - pending results or sooner if needed.  Waddell KATHEE Mon, NP

## 2023-10-01 ENCOUNTER — Ambulatory Visit: Payer: Self-pay | Admitting: Family Medicine

## 2023-10-18 ENCOUNTER — Other Ambulatory Visit (HOSPITAL_BASED_OUTPATIENT_CLINIC_OR_DEPARTMENT_OTHER): Payer: Self-pay

## 2023-10-23 ENCOUNTER — Encounter: Payer: Self-pay | Admitting: Urology

## 2023-10-23 ENCOUNTER — Ambulatory Visit (INDEPENDENT_AMBULATORY_CARE_PROVIDER_SITE_OTHER): Admitting: Urology

## 2023-10-23 VITALS — BP 122/77 | HR 77 | Ht 60.63 in | Wt 171.0 lb

## 2023-10-23 DIAGNOSIS — R3121 Asymptomatic microscopic hematuria: Secondary | ICD-10-CM | POA: Diagnosis not present

## 2023-10-23 DIAGNOSIS — R3129 Other microscopic hematuria: Secondary | ICD-10-CM

## 2023-10-23 DIAGNOSIS — N281 Cyst of kidney, acquired: Secondary | ICD-10-CM

## 2023-10-23 LAB — MICROSCOPIC EXAMINATION: Bacteria, UA: NONE SEEN

## 2023-10-23 LAB — URINALYSIS, ROUTINE W REFLEX MICROSCOPIC
Bilirubin, UA: NEGATIVE
Glucose, UA: NEGATIVE
Ketones, UA: NEGATIVE
Leukocytes,UA: NEGATIVE
Nitrite, UA: NEGATIVE
Protein,UA: NEGATIVE
Specific Gravity, UA: 1.015 (ref 1.005–1.030)
Urobilinogen, Ur: 0.2 mg/dL (ref 0.2–1.0)
pH, UA: 6 (ref 5.0–7.5)

## 2023-10-23 NOTE — Progress Notes (Signed)
 Assessment: 1. Microscopic hematuria; prior negative evaluation   2. Bilateral renal cysts     Plan: She has a long history of asymptomatic microscopic hematuria with a prior negative evaluation.  Her UA has remained fairly stable. CX bladder triage sent today. Return to office in 1 year  Chief Complaint:   Chief Complaint  Patient presents with   Hematuria   History of Present Illness:  Carrie Elliott is a 73 y.o. female who is seen for further evaluation of microscopic hematuria. She was found to have blood on a dipstick urinalysis in October 2023.  Dipstick urinalysis from 01/23/2022 showed trace blood.  Urine culture showed no growth.  Repeat urinalysis from 02/05/2022 showed small blood.  Urine culture grew <10K colonies. No gross hematuria.  No flank pain. U/A 03/08/22: 0-2 RBC   CT abdomen and pelvis with contrast from 01/25/2022 showed no renal or ureteral calculi, no hydronephrosis, bilateral renal cysts, and diverticulitis in the sigmoid colon. Treated with antibiotics.  Her left lower abdominal pain improved.  She has a history of a cystocele.  She underwent a hysterectomy and MMK procedure a number of years ago.  She has noted a recurrent bulge in the vaginal area.  This was previously evaluated by urogynecology.  She has nocturia 1-3 times and rare urinary incontinence associated with coughing and sneezing.  No history of UTIs.  She has occasional discomfort associated with the vaginal bulge.  She previously underwent hematuria evaluation by Dr. Alfonzo in 2007.  No abnormalities were found on cystoscopy.  Urine cytology was negative. At her visit in 1/24, her urinary symptoms were unchanged.  No dysuria or gross hematuria.  No flank pain.  U/A from 7/24:  3-10 RBC  She returns today for follow-up.  She has not had any significant change in her urinary symptoms.  She did have a several day episode of some increased frequency and urgency.  No dysuria or gross hematuria no  flank pain.  Portions of the above documentation were copied from a prior visit for review purposes only.  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    Past Surgical History:  Past Surgical History:  Procedure Laterality Date   APPENDECTOMY  1989   atypical nevus excision     BREAST BIOPSY Right    CHOLECYSTECTOMY  1989   COLONOSCOPY WITH PROPOFOL  N/A 05/27/2015   Procedure: COLONOSCOPY WITH PROPOFOL ;  Surgeon: Oliva Boots, MD;  Location: [facility];  Service: Endoscopy;  Laterality: N/A;   CYSTOSCOPY W/ URETERAL STENT REMOVAL  1999   ESOPHAGOGASTRODUODENOSCOPY  2011   with esophageal stent, perforation    ESOPHAGOGASTRODUODENOSCOPY (EGD) WITH PROPOFOL  N/A 05/27/2015   Procedure: ESOPHAGOGASTRODUODENOSCOPY (EGD) WITH PROPOFOL ;  Surgeon: Oliva Boots, MD;  Location: [facility];  Service: Endoscopy;  Laterality: N/A;   HEMORRHOID SURGERY  2005   LEFT OOPHORECTOMY  1999   complicated  by ureteral injuery requiring a ureteral stent    MMK / ANTERIOR VESICOURETHROPEXY / URETHROPEXY  2000   TONSILLECTOMY AND ADENOIDECTOMY     TOTAL ABDOMINAL HYSTERECTOMY  2000   with USO   TUBAL LIGATION  1981   wisdom teeth removal  1964    Allergies:  Allergies  Allergen Reactions   Pseudoephedrine Hcl Er Other (See Comments)    TACHYCARDIA    Singulair [Montelukast Sodium] Other (See Comments)    Mental status changes, irritable   Cefdinir Hives   Celebrex  [Celecoxib] Other (See Comments)    ABDOMINAL PAIN    Ciprofloxacin Other (See Comments)    ABDOMINAL PAIN    Codeine Nausea And Vomiting    NAUSEA VOMITING - can take with zofran     Family History:  Family History  Problem Relation Age of Onset   Osteoporosis Mother    Transient ischemic attack Mother    Cancer Mother        colon   Heart disease Mother        afib   Hyperlipidemia Mother    Colon polyps Father    Hypertension Father    Heart failure Father    Melanoma Father        multiple   Heart attack Father 57       x2   Heart disease Father        Bundle Block   Meniere's disease Father    Cancer Father        melanoma   Obesity Brother    Stroke Maternal Grandmother    Cancer Maternal Grandmother        skin, melanoma, sarcoma   Colon polyps Maternal Grandmother    Diverticulitis Maternal Grandmother    Stroke Maternal Grandfather    Heart disease Maternal Grandfather    Diabetes Paternal Grandmother     Social History:  Social History   Tobacco Use   Smoking status: Never   Smokeless tobacco: Never  Vaping Use   Vaping status: Never Used  Substance Use Topics   Alcohol use: No   Drug use: No    ROS: Constitutional:  Negative for fever, chills, weight loss CV: Negative for chest pain, previous MI, hypertension Respiratory:  Negative for shortness of breath, wheezing, sleep apnea, frequent cough GI:  Negative for nausea, vomiting, bloody stool, GERD   Physical exam: BP 122/77   Pulse 77   Ht 5' 0.63 (1.54 m)   Wt 171 lb (77.6 kg)   BMI 32.71 kg/m  GENERAL APPEARANCE:  Well appearing, well developed, well nourished, NAD HEENT:  Atraumatic, normocephalic, oropharynx clear NECK:  Supple without lymphadenopathy or thyromegaly ABDOMEN:  Soft, non-tender, no masses EXTREMITIES:  Moves all extremities well, without clubbing, cyanosis, or edema NEUROLOGIC:  Alert and oriented x 3, normal gait, CN II-XII grossly intact MENTAL STATUS:   appropriate BACK:  Non-tender to palpation, No CVAT SKIN:  Warm, dry, and intact  Results: U/A: 3-10 RBCs

## 2023-10-25 DIAGNOSIS — M79672 Pain in left foot: Secondary | ICD-10-CM | POA: Diagnosis not present

## 2023-10-25 DIAGNOSIS — M25572 Pain in left ankle and joints of left foot: Secondary | ICD-10-CM | POA: Diagnosis not present

## 2023-10-25 DIAGNOSIS — M25872 Other specified joint disorders, left ankle and foot: Secondary | ICD-10-CM | POA: Diagnosis not present

## 2023-10-28 ENCOUNTER — Ambulatory Visit (HOSPITAL_COMMUNITY)
Admission: RE | Admit: 2023-10-28 | Discharge: 2023-10-28 | Disposition: A | Discharge status: Home / Self Care | Source: Ambulatory Visit | Attending: Cardiology | Admitting: Cardiology

## 2023-10-28 ENCOUNTER — Ambulatory Visit: Payer: Self-pay | Admitting: Cardiology

## 2023-10-28 DIAGNOSIS — R072 Precordial pain: Secondary | ICD-10-CM | POA: Diagnosis not present

## 2023-10-28 DIAGNOSIS — I10 Essential (primary) hypertension: Secondary | ICD-10-CM

## 2023-10-28 DIAGNOSIS — I429 Cardiomyopathy, unspecified: Secondary | ICD-10-CM

## 2023-10-28 DIAGNOSIS — R0602 Shortness of breath: Secondary | ICD-10-CM

## 2023-10-28 LAB — ECHOCARDIOGRAM COMPLETE
Area-P 1/2: 4.02 cm2
S' Lateral: 2.5 cm

## 2023-10-29 ENCOUNTER — Ambulatory Visit (HOSPITAL_BASED_OUTPATIENT_CLINIC_OR_DEPARTMENT_OTHER)
Admission: RE | Admit: 2023-10-29 | Discharge: 2023-10-29 | Disposition: A | Discharge status: Home / Self Care | Source: Ambulatory Visit | Attending: Student | Admitting: Student

## 2023-10-29 ENCOUNTER — Other Ambulatory Visit (HOSPITAL_BASED_OUTPATIENT_CLINIC_OR_DEPARTMENT_OTHER): Payer: Self-pay

## 2023-10-29 DIAGNOSIS — Z780 Asymptomatic menopausal state: Secondary | ICD-10-CM | POA: Insufficient documentation

## 2023-10-29 DIAGNOSIS — M8589 Other specified disorders of bone density and structure, multiple sites: Secondary | ICD-10-CM | POA: Diagnosis not present

## 2023-10-30 ENCOUNTER — Other Ambulatory Visit (HOSPITAL_BASED_OUTPATIENT_CLINIC_OR_DEPARTMENT_OTHER): Payer: Self-pay

## 2023-10-30 ENCOUNTER — Encounter: Payer: Self-pay | Admitting: Urology

## 2023-10-31 ENCOUNTER — Encounter: Payer: Self-pay | Admitting: Urology

## 2023-11-01 NOTE — Telephone Encounter (Signed)
 Spoke with pt regarding her results. Pt stated she is still having leg swelling on one side. She had an ultrasound that did not show an DVT. Pt stated she also had an episode where she had a fast heart rate for about 5 hours with chest discomfort and pain in one arm and muscle aches. Pt went to the ED but they did not find anything and gave her nitroglycerin . Pt is wondering what the next steps are as the echo was normal but she does not feel normal. Pt was told that should she have another episode that she should go back to the ED and that the information she provided would be sent to Dr. Lavona for his suggestions. Pt verbalized understanding. All questions if any were answered.

## 2023-11-01 NOTE — Telephone Encounter (Signed)
-----   Message from Lynwood Schilling sent at 10/28/2023  3:38 PM EDT ----- Normal EF.  No increased wall thickness or any other significant abnormalities.  No further imaging or change in therapy is needed.  Call Ms. Loud with the results and send results to Domenica Harlene LABOR, MD ----- Message ----- From: Interface, [employer] Sent: 10/28/2023   2:10 PM EDT To: Lynwood Schilling, MD

## 2023-11-04 ENCOUNTER — Other Ambulatory Visit (HOSPITAL_BASED_OUTPATIENT_CLINIC_OR_DEPARTMENT_OTHER): Payer: Self-pay

## 2023-11-04 MED ORDER — METOPROLOL TARTRATE 100 MG PO TABS
100.0000 mg | ORAL_TABLET | Freq: Once | ORAL | 0 refills | Status: DC
  Filled 2023-11-04: qty 1, 1d supply, fill #0

## 2023-11-04 NOTE — Telephone Encounter (Signed)
 Spoke with pt regarding Dr. Denver suggestion to have a Coronary CTA. Pt agreeable. Instructions sent via MyChart. Pt verbalized understanding. All questions if any were answered.

## 2023-11-04 NOTE — Telephone Encounter (Signed)
-----   Message from Lynwood Schilling sent at 10/28/2023  3:38 PM EDT ----- Normal EF.  No increased wall thickness or any other significant abnormalities.  No further imaging or change in therapy is needed.  Call Ms. Loud with the results and send results to Domenica Harlene LABOR, MD ----- Message ----- From: Interface, [employer] Sent: 10/28/2023   2:10 PM EDT To: Lynwood Schilling, MD

## 2023-11-07 DIAGNOSIS — M7061 Trochanteric bursitis, right hip: Secondary | ICD-10-CM | POA: Diagnosis not present

## 2023-11-07 DIAGNOSIS — M7062 Trochanteric bursitis, left hip: Secondary | ICD-10-CM | POA: Diagnosis not present

## 2023-11-07 DIAGNOSIS — M1712 Unilateral primary osteoarthritis, left knee: Secondary | ICD-10-CM | POA: Diagnosis not present

## 2023-11-08 DIAGNOSIS — R0602 Shortness of breath: Secondary | ICD-10-CM | POA: Diagnosis not present

## 2023-11-08 DIAGNOSIS — R072 Precordial pain: Secondary | ICD-10-CM | POA: Diagnosis not present

## 2023-11-08 DIAGNOSIS — I429 Cardiomyopathy, unspecified: Secondary | ICD-10-CM | POA: Diagnosis not present

## 2023-11-08 DIAGNOSIS — I10 Essential (primary) hypertension: Secondary | ICD-10-CM | POA: Diagnosis not present

## 2023-11-09 LAB — BASIC METABOLIC PANEL WITH GFR
BUN/Creatinine Ratio: 21 (ref 12–28)
BUN: 16 mg/dL (ref 8–27)
CO2: 21 mmol/L (ref 20–29)
Calcium: 9.3 mg/dL (ref 8.7–10.3)
Chloride: 100 mmol/L (ref 96–106)
Creatinine, Ser: 0.78 mg/dL (ref 0.57–1.00)
Glucose: 87 mg/dL (ref 70–99)
Potassium: 4.6 mmol/L (ref 3.5–5.2)
Sodium: 137 mmol/L (ref 134–144)
eGFR: 80 mL/min/1.73 (ref >59)

## 2023-11-12 DIAGNOSIS — M25572 Pain in left ankle and joints of left foot: Secondary | ICD-10-CM | POA: Diagnosis not present

## 2023-11-12 DIAGNOSIS — M79672 Pain in left foot: Secondary | ICD-10-CM | POA: Diagnosis not present

## 2023-11-13 ENCOUNTER — Encounter (HOSPITAL_COMMUNITY): Payer: Self-pay

## 2023-11-18 ENCOUNTER — Ambulatory Visit (HOSPITAL_COMMUNITY)
Admission: RE | Admit: 2023-11-18 | Discharge: 2023-11-18 | Disposition: A | Discharge status: Home / Self Care | Source: Ambulatory Visit | Attending: Cardiology | Admitting: Cardiology

## 2023-11-18 DIAGNOSIS — R072 Precordial pain: Secondary | ICD-10-CM | POA: Diagnosis not present

## 2023-11-18 DIAGNOSIS — K449 Diaphragmatic hernia without obstruction or gangrene: Secondary | ICD-10-CM | POA: Diagnosis not present

## 2023-11-18 DIAGNOSIS — I2510 Atherosclerotic heart disease of native coronary artery without angina pectoris: Secondary | ICD-10-CM | POA: Diagnosis not present

## 2023-11-18 MED ORDER — NITROGLYCERIN 0.4 MG SL SUBL
0.8000 mg | SUBLINGUAL_TABLET | Freq: Once | SUBLINGUAL | Status: AC
  Administered 2023-11-18: 0.8 mg via SUBLINGUAL

## 2023-11-18 MED ORDER — IOHEXOL 350 MG/ML SOLN
100.0000 mL | Freq: Once | INTRAVENOUS | Status: AC | PRN
  Administered 2023-11-18: 100 mL via INTRAVENOUS

## 2023-11-21 DIAGNOSIS — M79672 Pain in left foot: Secondary | ICD-10-CM | POA: Diagnosis not present

## 2023-11-21 DIAGNOSIS — M25572 Pain in left ankle and joints of left foot: Secondary | ICD-10-CM | POA: Diagnosis not present

## 2023-11-26 DIAGNOSIS — M79672 Pain in left foot: Secondary | ICD-10-CM | POA: Diagnosis not present

## 2023-11-26 DIAGNOSIS — M25572 Pain in left ankle and joints of left foot: Secondary | ICD-10-CM | POA: Diagnosis not present

## 2023-11-26 NOTE — Assessment & Plan Note (Signed)
 Supplement and monitor

## 2023-11-26 NOTE — Assessment & Plan Note (Signed)
 [id number] acceptable, minimize simple carbs. Increase exercise as tolerated.

## 2023-11-26 NOTE — Assessment & Plan Note (Addendum)
 Encouraged to get adequate exercise, calcium and vitamin d  intake, last Dexa 2025, reviewed. No significant change from scan in 2022.

## 2023-11-26 NOTE — Assessment & Plan Note (Signed)
 Well controlled, no changes to meds. Encouraged heart healthy diet such as the DASH diet and exercise as tolerated.

## 2023-11-28 ENCOUNTER — Ambulatory Visit (INDEPENDENT_AMBULATORY_CARE_PROVIDER_SITE_OTHER): Admitting: Family Medicine

## 2023-11-28 ENCOUNTER — Encounter: Payer: Self-pay | Admitting: Family Medicine

## 2023-11-28 ENCOUNTER — Other Ambulatory Visit (HOSPITAL_BASED_OUTPATIENT_CLINIC_OR_DEPARTMENT_OTHER): Payer: Self-pay

## 2023-11-28 VITALS — BP 124/60 | HR 77 | Resp 16 | Ht 60.63 in | Wt 172.0 lb

## 2023-11-28 DIAGNOSIS — E538 Deficiency of other specified B group vitamins: Secondary | ICD-10-CM | POA: Diagnosis not present

## 2023-11-28 DIAGNOSIS — I10 Essential (primary) hypertension: Secondary | ICD-10-CM

## 2023-11-28 DIAGNOSIS — E785 Hyperlipidemia, unspecified: Secondary | ICD-10-CM | POA: Diagnosis not present

## 2023-11-28 DIAGNOSIS — R739 Hyperglycemia, unspecified: Secondary | ICD-10-CM

## 2023-11-28 DIAGNOSIS — M8580 Other specified disorders of bone density and structure, unspecified site: Secondary | ICD-10-CM

## 2023-11-28 DIAGNOSIS — M791 Myalgia, unspecified site: Secondary | ICD-10-CM

## 2023-11-28 DIAGNOSIS — E559 Vitamin D deficiency, unspecified: Secondary | ICD-10-CM

## 2023-11-28 LAB — COMPREHENSIVE METABOLIC PANEL WITH GFR
ALT: 16 U/L (ref 0–35)
AST: 18 U/L (ref 0–37)
Albumin: 4.2 g/dL (ref 3.5–5.2)
Alkaline Phosphatase: 54 U/L (ref 39–117)
BUN: 24 mg/dL — ABNORMAL HIGH (ref 6–23)
CO2: 28 meq/L (ref 19–32)
Calcium: 9.2 mg/dL (ref 8.4–10.5)
Chloride: 101 meq/L (ref 96–112)
Creatinine, Ser: 0.84 mg/dL (ref 0.40–1.20)
GFR: 69.06 mL/min (ref >60.00)
Glucose, Bld: 87 mg/dL (ref 70–99)
Potassium: 4.5 meq/L (ref 3.5–5.1)
Sodium: 136 meq/L (ref 135–145)
Total Bilirubin: 0.4 mg/dL (ref 0.2–1.2)
Total Protein: 7.2 g/dL (ref 6.0–8.3)

## 2023-11-28 LAB — CBC WITH DIFFERENTIAL/PLATELET
Basophils Absolute: 0.1 K/uL (ref 0.0–0.1)
Basophils Relative: 0.8 % (ref 0.0–3.0)
Eosinophils Absolute: 0.1 K/uL (ref 0.0–0.7)
Eosinophils Relative: 0.9 % (ref 0.0–5.0)
HCT: 37.1 % (ref 36.0–46.0)
Hemoglobin: 12.2 g/dL (ref 12.0–15.0)
Lymphocytes Relative: 17.9 % (ref 12.0–46.0)
Lymphs Abs: 1.3 K/uL (ref 0.7–4.0)
MCHC: 33 g/dL (ref 30.0–36.0)
MCV: 88.5 fl (ref 78.0–100.0)
Monocytes Absolute: 0.6 K/uL (ref 0.1–1.0)
Monocytes Relative: 8.3 % (ref 3.0–12.0)
Neutro Abs: 5.3 K/uL (ref 1.4–7.7)
Neutrophils Relative %: 72.1 % (ref 43.0–77.0)
Platelets: 261 K/uL (ref 150.0–400.0)
RBC: 4.2 Mil/uL (ref 3.87–5.11)
RDW: 13.2 % (ref 11.5–15.5)
WBC: 7.4 K/uL (ref 4.0–10.5)

## 2023-11-28 LAB — TSH: TSH: 0.74 u[IU]/mL (ref 0.35–5.50)

## 2023-11-28 LAB — LIPID PANEL
Cholesterol: 205 mg/dL — ABNORMAL HIGH (ref 0–200)
HDL: 66.5 mg/dL (ref >39.00)
LDL Cholesterol: 99 mg/dL (ref 0–99)
NonHDL: 138.06
Total CHOL/HDL Ratio: 3
Triglycerides: 196 mg/dL — ABNORMAL HIGH (ref 0.0–149.0)
VLDL: 39.2 mg/dL (ref 0.0–40.0)

## 2023-11-28 LAB — MAGNESIUM: Magnesium: 2 mg/dL (ref 1.5–2.5)

## 2023-11-28 LAB — VITAMIN B12: Vitamin B-12: 442 pg/mL (ref 211–911)

## 2023-11-28 LAB — VITAMIN D 25 HYDROXY (VIT D DEFICIENCY, FRACTURES): VITD: 39.91 ng/mL (ref 30.00–100.00)

## 2023-11-28 LAB — HEMOGLOBIN A1C: Hgb A1c MFr Bld: 6.1 % (ref 4.6–6.5)

## 2023-11-28 MED ORDER — METOPROLOL SUCCINATE ER 50 MG PO TB24
50.0000 mg | ORAL_TABLET | Freq: Two times a day (BID) | ORAL | 1 refills | Status: AC
  Filled 2023-11-28 – 2024-03-02 (×3): qty 360, 90d supply, fill #0

## 2023-11-28 MED ORDER — LISINOPRIL 10 MG PO TABS
10.0000 mg | ORAL_TABLET | Freq: Two times a day (BID) | ORAL | 3 refills | Status: AC
  Filled 2023-11-28 – 2024-01-13 (×2): qty 180, 90d supply, fill #0
  Filled 2024-04-06 (×2): qty 180, 90d supply, fill #1

## 2023-11-28 MED ORDER — AMLODIPINE BESYLATE 2.5 MG PO TABS
2.5000 mg | ORAL_TABLET | Freq: Every day | ORAL | 3 refills | Status: AC
  Filled 2023-11-28 – 2024-02-13 (×2): qty 90, 90d supply, fill #0

## 2023-11-28 NOTE — Patient Instructions (Addendum)
 You need to put your feet above your heart roughly 15 minutes three x a day  [place] socks make some compression hose  [facility] by [facility]  Magnesium  Glycinate 200-400 mg at bedtime

## 2023-11-29 ENCOUNTER — Telehealth: Payer: Self-pay

## 2023-11-29 ENCOUNTER — Ambulatory Visit: Payer: Self-pay | Admitting: Family Medicine

## 2023-11-29 NOTE — Telephone Encounter (Signed)
 Patient required Vit D to be updated on her med list as take 1 tab daily and remove the metoprolol  tartrate 100 mg. Medication list was updated pre patients request.

## 2023-12-02 ENCOUNTER — Encounter: Payer: Self-pay | Admitting: Family Medicine

## 2023-12-02 DIAGNOSIS — M25572 Pain in left ankle and joints of left foot: Secondary | ICD-10-CM | POA: Diagnosis not present

## 2023-12-02 DIAGNOSIS — M79672 Pain in left foot: Secondary | ICD-10-CM | POA: Diagnosis not present

## 2023-12-02 NOTE — Progress Notes (Signed)
 Subjective:    Patient ID: Carrie Elliott, female    DOB: [birth year], 73 y.o.   MRN: [id number]  Chief Complaint  Patient presents with   Medical Management of Chronic Issues    Patient presents today for a 3 month follow-up.   Quality Metric Gaps    AWV    HPI Discussed the use of AI scribe software for clinical note transcription with the patient, who gave verbal consent to proceed.  History of Present Illness Carrie Elliott is a 73 year old female with coronary artery disease who presents with concerns about ankle swelling and a recent ER visit for chest pain.  She experienced chest pain on June 3rd, leading to an ER visit at [facility]. A CT scan of the chest was performed due to concerns of a pulmonary embolism. During the procedure, the CT dye infiltrated her arm, causing discomfort and swelling. She was discharged on June 4th.  She has significant swelling in her right ankle, which she mentioned to both her primary care provider and the ER doctor, but it was not addressed. She was told that an x-ray showed a heel spur. She was referred to [facility] but chose to see Dr. Kit at [facility], who told her she has what is generically called ankle impingement syndrome, early hindfoot and midfoot arthritis, posterior tibial tendon dysfunction, and lateral impingement. She is using arch supports, a brace for activities, and undergoing physical therapy to strengthen her ankles. She reports instability and balance issues related to her ankle condition.  She has a history of knee issues and has been told she has advanced arthritic changes in one knee. She reports that after a cortisone injection, the swelling went down and her symptoms improved. She uses compression stockings for swelling and elevates her feet to reduce fluid retention.  She reports that her coronary CT scan showed mild stenosis and non-calcified plaque in the right coronary artery. Her coronary calcium score was  zero. She is awaiting further instructions from her cardiologist.  She reports difficulty sleeping. She is actively monitoring her blood pressure and has adjusted her activity level, aiming for 4,000 to 5,000 steps daily. Her blood pressure is well-controlled on amlodipine  2.5 mg, despite some concerns about its contribution to swelling.    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    Past Surgical History:  Procedure Laterality Date   APPENDECTOMY  1989   atypical nevus excision     BREAST BIOPSY Right    CHOLECYSTECTOMY  1989   COLONOSCOPY WITH PROPOFOL  N/A 05/27/2015   Procedure: COLONOSCOPY WITH PROPOFOL ;  Surgeon: Oliva Boots, MD;  Location: [facility];  Service: Endoscopy;  Laterality: N/A;   CYSTOSCOPY W/ URETERAL STENT REMOVAL  1999   ESOPHAGOGASTRODUODENOSCOPY  2011   with esophageal stent, perforation    ESOPHAGOGASTRODUODENOSCOPY (EGD) WITH PROPOFOL  N/A 05/27/2015   Procedure: ESOPHAGOGASTRODUODENOSCOPY (EGD)  WITH PROPOFOL ;  Surgeon: Oliva Boots, MD;  Location: [facility];  Service: Endoscopy;  Laterality: N/A;   HEMORRHOID SURGERY  2005   LEFT OOPHORECTOMY  1999   complicated by ureteral injuery requiring a ureteral stent    MMK / ANTERIOR VESICOURETHROPEXY / URETHROPEXY  2000   TONSILLECTOMY AND ADENOIDECTOMY     TOTAL ABDOMINAL HYSTERECTOMY  2000   with USO   TUBAL LIGATION  1981   wisdom teeth removal  1964    Family History  Problem Relation Age of Onset    Osteoporosis Mother    Transient ischemic attack Mother    Cancer Mother        colon   Heart disease Mother        afib   Hyperlipidemia Mother    Colon polyps Father    Hypertension Father    Heart failure Father    Melanoma Father        multiple   Heart attack Father 18       x2   Heart disease Father        Bundle Block   Meniere's disease Father    Cancer Father        melanoma   Obesity Brother    Stroke Maternal Grandmother    Cancer Maternal Grandmother        skin, melanoma, sarcoma   Colon polyps Maternal Grandmother    Diverticulitis Maternal Grandmother    Stroke Maternal Grandfather    Heart disease Maternal Grandfather    Diabetes Paternal Grandmother     Social History   Socioeconomic History   Marital status: Married    Spouse name: Not on file   Number of children: Not on file   Years of education: Not on file   Highest education level: Not on file  Occupational History   Not on file  Tobacco Use   Smoking status: Never   Smokeless tobacco: Never  Vaping Use   Vaping status: Never Used  Substance and Sexual Activity   Alcohol use: No   Drug use: No   Sexual activity: Yes    Birth control/protection: Surgical    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, avoids spicy  Other Topics Concern   Not on file  Social History Narrative   6 grand kids   Daughter and son in [place]-    Social Drivers of Health   Financial Resource Strain: Low Risk  (02/21/2021)   Overall Financial Resource Strain (CARDIA)    Difficulty of Paying Living Expenses: Not hard at all  Food Insecurity: No Food Insecurity (02/21/2022)   Hunger Vital Sign    Worried About Running Out of Food in the Last Year: Never true    Ran Out of Food in the Last Year: Never true  Transportation Needs: No Transportation Needs (02/21/2022)   PRAPARE - Administrator, Civil Service (Medical): No    Lack of Transportation (Non-Medical): No  Physical Activity:  Inactive (02/21/2021)   Exercise Vital Sign    Days of Exercise per Week: 0 days    Minutes of Exercise per Session: 0 min  Stress: No Stress Concern Present (02/21/2021)   [employer] of Occupational Health - Occupational Stress Questionnaire    Feeling of Stress : Not at all  Social Connections: Socially Integrated (02/21/2021)   Social Connection and Isolation Panel    Frequency of Communication with Friends and Family: More than three times a week  Frequency of Social Gatherings with Friends and Family: More than three times a week    Attends Religious Services: More than 4 times per year    Active Member of Clubs or Organizations: Yes    Attends Engineer, structural: More than 4 times per year    Marital Status: Married  Catering manager Violence: Not At Risk (02/21/2022)   Humiliation, Afraid, Rape, and Kick questionnaire    Fear of Current or Ex-Partner: No    Emotionally Abused: No    Physically Abused: No    Sexually Abused: No    Outpatient Medications Prior to Visit  Medication Sig Dispense Refill   acetaminophen  (TYLENOL ) 500 MG tablet Take 1,000 mg by mouth every 6 (six) hours as needed.     Calcium Citrate (CAL-CITRATE PO) Take 2 tablets by mouth 3 (three) times a week.     Cholecalciferol (VITAMIN D3) 50 MCG (2000 UT) TABS Take by mouth. Take 1 tablet daily.     esomeprazole  (NEXIUM ) 40 MG capsule Take 1 capsule (40 mg total) by mouth 1/2 to 1 hour before morning meal every morning 90 capsule 3   famotidine  (PEPCID ) 20 MG tablet Take 1 tablet (20 mg total) by mouth 2 (two) times daily for 90 days 180 tablet 3   fluticasone  (FLONASE ) 50 MCG/ACT nasal spray Place 2 sprays into both nostrils daily. 16 g 6   Lidocaine -Hydrocortisone  Ace 3-0.5 % KIT Apply 1 Application topically daily as needed. 1 kit 2   Loperamide-Simethicone  2-125 MG TABS Take by mouth as needed.     Probiotic Product (PROBIOTIC-10 PO) Take 1 capsule by mouth daily. 25 billion      SIMPLY SALINE NA Place 1 each into the nose daily.      amLODipine  (NORVASC ) 2.5 MG tablet Take 1 tablet (2.5 mg total) by mouth daily. 90 tablet 1   lisinopril  (ZESTRIL ) 10 MG tablet Take 1 tablet (10 mg total) by mouth 2 (two) times daily. 180 tablet 0   metoprolol  succinate (TOPROL -XL) 50 MG 24 hr tablet Take 1-2 tablets (50-100 mg total) by mouth 2 (two) times daily with or immediately following a meal. 360 tablet 0   metoprolol  tartrate (LOPRESSOR ) 100 MG tablet Take 1 tablet (100 mg total) by mouth once for 1 dose. Take 90-120 minutes prior to scan. Hold for SBP less than 110. 1 tablet 0   No facility-administered medications prior to visit.    Allergies  Allergen Reactions   Pseudoephedrine Hcl Er Other (See Comments)    TACHYCARDIA    Singulair [Montelukast Sodium] Other (See Comments)    Mental status changes, irritable   Cefdinir Hives   Celebrex [Celecoxib] Other (See Comments)    ABDOMINAL PAIN    Ciprofloxacin Other (See Comments)    ABDOMINAL PAIN    Codeine Nausea And Vomiting    NAUSEA VOMITING - can take with zofran     Review of Systems  Constitutional:  Negative for fever and malaise/fatigue.  HENT:  Negative for congestion.   Eyes:  Negative for blurred vision.  Respiratory:  Negative for shortness of breath.   Cardiovascular:  Positive for chest pain and leg swelling. Negative for palpitations.  Gastrointestinal:  Negative for abdominal pain, blood in stool and nausea.  Genitourinary:  Negative for dysuria and frequency.  Musculoskeletal:  Positive for joint pain and myalgias. Negative for falls.  Skin:  Negative for rash.  Neurological:  Negative for dizziness, loss of consciousness and headaches.  Endo/Heme/Allergies:  Negative for  environmental allergies.  Psychiatric/Behavioral:  Negative for depression. The patient has insomnia. The patient is not nervous/anxious.        Objective:    Physical Exam Constitutional:      General: She is not in acute  distress.    Appearance: Normal appearance. She is well-developed. She is not toxic-appearing.  HENT:     Head: Normocephalic and atraumatic.     Right Ear: External ear normal.     Left Ear: External ear normal.     Nose: Nose normal.  Eyes:     General:        Right eye: No discharge.        Left eye: No discharge.     Conjunctiva/sclera: Conjunctivae normal.  Neck:     Thyroid : No thyromegaly.  Cardiovascular:     Rate and Rhythm: Normal rate and regular rhythm.     Heart sounds: Normal heart sounds. No murmur heard. Pulmonary:     Effort: Pulmonary effort is normal. No respiratory distress.     Breath sounds: Normal breath sounds.  Abdominal:     General: Bowel sounds are normal.     Palpations: Abdomen is soft.     Tenderness: There is no abdominal tenderness. There is no guarding.  Musculoskeletal:        General: Normal range of motion.     Cervical back: Neck supple.     Left lower leg: Edema present.  Lymphadenopathy:     Cervical: No cervical adenopathy.  Skin:    General: Skin is warm and dry.  Neurological:     Mental Status: She is alert and oriented to person, place, and time.  Psychiatric:        Mood and Affect: Mood normal.        Behavior: Behavior normal.        Thought Content: Thought content normal.        Judgment: Judgment normal.     BP 124/60   Pulse 77   Resp 16   Ht 5' 0.63 (1.54 m)   Wt 172 lb (78 kg)   SpO2 98%   BMI 32.90 kg/m  Wt Readings from Last 3 Encounters:  11/28/23 172 lb (78 kg)  10/23/23 171 lb (77.6 kg)  09/30/23 174 lb (78.9 kg)    Diabetic Foot Exam - Simple   No data filed    Lab Results  Component Value Date   WBC 7.4 11/28/2023   HGB 12.2 11/28/2023   HCT 37.1 11/28/2023   PLT 261.0 11/28/2023   GLUCOSE 87 11/28/2023   CHOL 205 (H) 11/28/2023   TRIG 196.0 (H) 11/28/2023   HDL 66.50 11/28/2023   LDLDIRECT 108.0 03/08/2022   LDLCALC 99 11/28/2023   ALT 16 11/28/2023   AST 18 11/28/2023   NA 136  11/28/2023   K 4.5 11/28/2023   CL 101 11/28/2023   CREATININE 0.84 11/28/2023   BUN 24 (H) 11/28/2023   CO2 28 11/28/2023   TSH 0.74 11/28/2023   INR 1.19 02/07/2010   HGBA1C 6.1 11/28/2023    Lab Results  Component Value Date   TSH 0.74 11/28/2023   Lab Results  Component Value Date   WBC 7.4 11/28/2023   HGB 12.2 11/28/2023   HCT 37.1 11/28/2023   MCV 88.5 11/28/2023   PLT 261.0 11/28/2023   Lab Results  Component Value Date   NA 136 11/28/2023   K 4.5 11/28/2023   CO2 28 11/28/2023   GLUCOSE  87 11/28/2023   BUN 24 (H) 11/28/2023   CREATININE 0.84 11/28/2023   BILITOT 0.4 11/28/2023   ALKPHOS 54 11/28/2023   AST 18 11/28/2023   ALT 16 11/28/2023   PROT 7.2 11/28/2023   ALBUMIN 4.2 11/28/2023   CALCIUM 9.2 11/28/2023   ANIONGAP 11 08/27/2023   EGFR 80 11/08/2023   GFR 69.06 11/28/2023   Lab Results  Component Value Date   CHOL 205 (H) 11/28/2023   Lab Results  Component Value Date   HDL 66.50 11/28/2023   Lab Results  Component Value Date   LDLCALC 99 11/28/2023   Lab Results  Component Value Date   TRIG 196.0 (H) 11/28/2023   Lab Results  Component Value Date   CHOLHDL 3 11/28/2023   Lab Results  Component Value Date   HGBA1C 6.1 11/28/2023       Assessment & Plan:  Hyperglycemia Assessment & Plan: hgba1c acceptable, minimize simple carbs. Increase exercise as tolerated.  Orders: -     Lipid panel -     Hemoglobin A1c  Primary hypertension Assessment & Plan: Well controlled, no changes to meds. Encouraged heart healthy diet such as the DASH diet and exercise as tolerated.    Orders: -     Lisinopril ; Take 1 tablet (10 mg total) by mouth 2 (two) times daily.  Dispense: 180 tablet; Refill: 3 -     Metoprolol  Succinate ER; Take 1-2 tablets (50-100 mg total) by mouth 2 (two) times daily with or immediately following a meal.  Dispense: 360 tablet; Refill: 1 -     amLODIPine  Besylate; Take 1 tablet (2.5 mg total) by mouth daily.   Dispense: 90 tablet; Refill: 3 -     Comprehensive metabolic panel with GFR -     CBC with Differential/Platelet -     TSH  Osteopenia, unspecified location Assessment & Plan: Encouraged to get adequate exercise, calcium and vitamin d  intake, last Dexa 2025, reviewed. No significant change from scan in 2022.  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 Symptoms include significant swelling and pain. Orthopedic management is ongoing with Dr. Kit. Current treatment includes physical therapy, arch support, and bracing for activities. She has opted for orthopedic management over podiatric care due to past family experiences with podiatric interventions. - Continue physical therapy to strengthen ankle muscles and ligaments. - Use arch support in shoes. - Wear brace during activities such as shopping or attending events. - Coordinate with Dr. Kit for ongoing management.  Left knee osteoarthritis Advanced osteoarthritis in the left knee with significant arthritic changes. Cortisone injection has been administered, resulting in reduced swelling and pain. Physical therapy and bracing have been beneficial. - Continue physical therapy and bracing for knee support. - Use ice therapy as needed for swelling.  Bilateral foot deformities (hammer toes, flat feet, ligamentous instability) Bilateral  foot deformities including hammer toes, flat feet, and ligamentous instability. These  contribute to balance issues and instability. Orthopedic management is ongoing. She experiences instability in the ligaments and deviation of the second toe over the great toe. - Continue with orthopedic management and follow-up with Dr. Kit. - Use arch support and bracing as recommended.  Coronary artery disease with mild right coronary artery stenosis and noncalcified plaque Mild stenosis and noncalcified plaque in the right coronary artery. Coronary calcium score is zero, indicating low risk. Medical management is preferred over invasive procedures due to higher risk of intervention. She is advised to consider lifestyle modifications to manage the condition. - Consider starting a statin for cholesterol management. - Implement dietary changes to reduce processed foods and unhealthy fats. - Increase physical activity as tolerated.  Primary hypertension Primary hypertension with current medication regimen. Recent changes in medication include the addition of amlodipine , which may contribute to swelling. She is advised to monitor for swelling and consider medication adjustments if necessary. - Continue current antihypertensive regimen including amlodipine  and hydrochlorothiazide  as needed. - Monitor blood pressure regularly. - Evaluate for potential medication adjustments if swelling persists.  Insomnia Chronic insomnia with difficulty sleeping. No pharmacological treatment recommended due to risks and limited efficacy. Cognitive behavioral therapy for insomnia (CBTI) is suggested as a non-pharmacological approach. She is encouraged to explore CBTI and other non-pharmacological strategies for managing insomnia. - Download and use the CBTI Coach app for insomnia management. - Monitor sleep patterns and adjust lifestyle as needed.  Osteopenia Osteopenia with no significant changes from previous bone density  scans.  Right arm venous injury due to CT contrast infiltration (resolving) Right arm venous injury due to CT contrast infiltration. Symptoms include swelling and discoloration, but expected to resolve over time. She expressed concerns about the lack of documentation of the incident in her medical records. - Monitor for resolution of symptoms over the next 6-12 months. - Document incident in medical records for future reference.  General Health Maintenance Discussion of general health maintenance including vaccinations and lifestyle modifications. - Plan to receive flu and COVID-19 vaccinations in the fall. - Encourage regular physical activity and healthy diet.  Follow-Up Follow-up plans discussed for ongoing management of conditions. - Schedule follow-up appointment in December. - Coordinate with specialists for ongoing orthopedic and cardiac care. - Order lab work including magnesium  levels.  Recording duration: 35 minutes     Harlene Horton, MD

## 2023-12-04 DIAGNOSIS — M79672 Pain in left foot: Secondary | ICD-10-CM | POA: Diagnosis not present

## 2023-12-04 DIAGNOSIS — M25572 Pain in left ankle and joints of left foot: Secondary | ICD-10-CM | POA: Diagnosis not present

## 2023-12-06 NOTE — Telephone Encounter (Signed)
 Spoke with pt regarding her results. Pt stated she is following up with GI regarding her hiatal hernia and the renal cyst has not changed in years so she agreed that there is nothing to do for it. Pt is wondering about the mention of thoracic degenerative structures in the results and whether Dr. Lavona thinks she should be on a statin. Pt was told her questions would be forwarded to Dr. Lavona. Pt verbalized understanding. All questions if any were answered.

## 2023-12-06 NOTE — Telephone Encounter (Signed)
-----   Message from Lynwood Schilling sent at 12/02/2023  8:37 PM EDT ----- Please note that the radiology over read is back and she has a hiatal hernia that she can discuss with her PCP if she is having any GI symptoms and she has a renal cyst that would not need follow up.   Call Ms. Villard with the results and send results to Domenica Harlene LABOR, MD ----- Message ----- From: Interface, Rad Results In Sent: 11/18/2023   3:29 PM EDT To: Lynwood Schilling, MD

## 2023-12-10 DIAGNOSIS — H43811 Vitreous degeneration, right eye: Secondary | ICD-10-CM | POA: Diagnosis not present

## 2023-12-10 DIAGNOSIS — H2513 Age-related nuclear cataract, bilateral: Secondary | ICD-10-CM | POA: Diagnosis not present

## 2023-12-10 DIAGNOSIS — M25572 Pain in left ankle and joints of left foot: Secondary | ICD-10-CM | POA: Diagnosis not present

## 2023-12-10 DIAGNOSIS — M79672 Pain in left foot: Secondary | ICD-10-CM | POA: Diagnosis not present

## 2023-12-10 DIAGNOSIS — H52203 Unspecified astigmatism, bilateral: Secondary | ICD-10-CM | POA: Diagnosis not present

## 2023-12-12 ENCOUNTER — Encounter: Payer: Self-pay | Admitting: Family Medicine

## 2023-12-16 DIAGNOSIS — M25572 Pain in left ankle and joints of left foot: Secondary | ICD-10-CM | POA: Diagnosis not present

## 2023-12-16 DIAGNOSIS — M79672 Pain in left foot: Secondary | ICD-10-CM | POA: Diagnosis not present

## 2023-12-17 ENCOUNTER — Telehealth: Payer: Self-pay | Admitting: *Deleted

## 2023-12-17 ENCOUNTER — Encounter: Payer: Self-pay | Admitting: Family Medicine

## 2023-12-17 ENCOUNTER — Ambulatory Visit

## 2023-12-17 ENCOUNTER — Ambulatory Visit (INDEPENDENT_AMBULATORY_CARE_PROVIDER_SITE_OTHER): Admitting: *Deleted

## 2023-12-17 VITALS — BP 115/71 | HR 61 | Ht 60.0 in | Wt 172.0 lb

## 2023-12-17 DIAGNOSIS — Z1231 Encounter for screening mammogram for malignant neoplasm of breast: Secondary | ICD-10-CM | POA: Diagnosis not present

## 2023-12-17 DIAGNOSIS — Z0000 Encounter for general adult medical examination without abnormal findings: Secondary | ICD-10-CM | POA: Diagnosis not present

## 2023-12-17 NOTE — Patient Instructions (Addendum)
 Carrie Elliott , Thank you for taking time out of your busy schedule to complete your Annual Wellness Visit with me. I enjoyed our conversation and look forward to speaking with you again next year. I, as well as your care team,  appreciate your ongoing commitment to your health goals. Please review the following plan we discussed and let me know if I can assist you in the future. Your Game plan/ To Do List     Goals:  To improve balance and be able to be more physically active  Referrals: If you haven't heard from the office you've been referred to, please reach out to them at the phone provided.   Mammogram ([facility]):  [phone]. Due 10/23 or after.  Follow up Visits: Next Medicare AWV with our clinical staff:  12/23/24 9am, telephone  Next Office Visit with your provider: 02/27/24 10am, Dr Domenica.  Clinician Recommendations:  Aim for 30 minutes of exercise or brisk walking, 6-8 glasses of water, and 5 servings of fruits and vegetables each day.   You will need to get the following vaccines at your local pharmacy:  Flu and COVID       This is a list of the screening recommended for you and due dates:  Health Maintenance  Topic Date Due   Medicare Annual Wellness Visit  02/22/2023   Flu Shot  10/25/2023   COVID-19 Vaccine (8 - Pfizer risk 2024-25 season) 03/25/2024*   Breast Cancer Screening  01/14/2025   Colon Cancer Screening  02/01/2029   DTaP/Tdap/Td vaccine (5 - Td or Tdap) 10/15/2030   Pneumococcal Vaccine for age over 64  Completed   DEXA scan (bone density measurement)  Completed   Hepatitis C Screening  Completed   Zoster (Shingles) Vaccine  Completed   HPV Vaccine  Aged Out   Meningitis B Vaccine  Aged Out   Hepatitis B Vaccine  Discontinued  *Topic was postponed. The date shown is not the original due date.    Advanced directives: (In Chart) A copy of your advanced directives are scanned into your chart should your provider ever need it. Advance Care  Planning is important because it:  [x]  Makes sure you receive the medical care that is consistent with your values, goals, and preferences  [x]  It provides guidance to your family and loved ones and reduces their decisional burden about whether or not they are making the right decisions based on your wishes.  Follow the link provided in your after visit summary or read over the paperwork we have mailed to you to help you started getting your Advance Directives in place. If you need assistance in completing these, please reach out to us  so that we can help you!  See attachments for Preventive Care and Fall Prevention Tips.

## 2023-12-17 NOTE — Progress Notes (Signed)
 Please attest this visit in the absence of patient primary care provider.    Subjective:   Carrie Elliott is a 73 y.o. who presents for a Medicare Wellness preventive visit.  As a reminder, Annual Wellness Visits don't include a physical exam, and some assessments may be limited, especially if this visit is performed virtually. We may recommend an in-person follow-up visit with your provider if needed.  Visit Complete: Virtual I connected with  Rock GORMAN Lish on 12/17/23 by a audio enabled telemedicine application and verified that I am speaking with the correct person using two identifiers.  Patient Location: Home  Provider Location: Office/Clinic  I discussed the limitations of evaluation and management by telemedicine. The patient expressed understanding and agreed to proceed.  Vital Signs: Because this visit was a virtual/telehealth visit, some criteria may be missing or patient reported. Any vitals not documented were not able to be obtained and vitals that have been documented are patient reported.  VideoDeclined- This patient declined Librarian, academic. Therefore the visit was completed with audio only.  Persons Participating in Visit: Patient.  AWV Questionnaire: No: Patient Medicare AWV questionnaire was not completed prior to this visit.  Cardiac Risk Factors include: advanced age (>35men, >86 women);hypertension;dyslipidemia;Other (see comment), Risk factor comments: CAD     Objective:    Today's Vitals   12/17/23 0820  BP: 115/71  Pulse: 61  SpO2: 98%  Weight: 172 lb (78 kg)  Height: 5' 6 (1.676 m)   Body mass index is 27.76 kg/m.     12/17/2023    8:45 AM 08/27/2023   10:39 PM 02/21/2022    2:04 PM 05/08/2021    2:57 PM 02/22/2021    9:12 AM 02/21/2021    3:12 PM 10/14/2020    2:53 PM  Advanced Directives  Does Patient Have a Medical Advance Directive? Yes Yes Yes Yes Yes Yes No  Type of Advance Directive Living will  Healthcare Power of [place];Living will Healthcare Power of [place];Living will  Healthcare Power of [place];Living will Healthcare Power of [place];Living will   Does patient want to make changes to medical advance directive? No - Patient declined  No - Patient declined No - Patient declined No - Patient declined    Copy of Healthcare Power of Attorney in Chart?   Yes - validated most recent copy scanned in chart (See row information)  Yes - validated most recent copy scanned in chart (See row information) Yes - validated most recent copy scanned in chart (See row information)   Would patient like information on creating a medical advance directive?       No - Patient declined    Current Medications (verified) Outpatient Encounter Medications as of 12/17/2023  Medication Sig   acetaminophen  (TYLENOL ) 500 MG tablet Take 1,000 mg by mouth every 6 (six) hours as needed.   amLODipine  (NORVASC ) 2.5 MG tablet Take 1 tablet (2.5 mg total) by mouth daily.   Calcium Citrate (CAL-CITRATE PO) Take 2 tablets by mouth 3 (three) times a week.   Cholecalciferol (VITAMIN D3) 50 MCG (2000 UT) TABS Take by mouth. Take 1 tablet daily.   esomeprazole  (NEXIUM ) 40 MG capsule Take 1 capsule (40 mg total) by mouth 1/2 to 1 hour before morning meal every morning   famotidine  (PEPCID ) 20 MG tablet Take 1 tablet (20 mg total) by mouth 2 (two) times daily for 90 days   fluticasone  (FLONASE ) 50 MCG/ACT nasal spray Place 2 sprays into both nostrils  daily.   Lidocaine -Hydrocortisone  Ace 3-0.5 % KIT Apply 1 Application topically daily as needed.   lisinopril  (ZESTRIL ) 10 MG tablet Take 1 tablet (10 mg total) by mouth 2 (two) times daily.   Loperamide-Simethicone  2-125 MG TABS Take by mouth as needed.   metoprolol  succinate (TOPROL -XL) 50 MG 24 hr tablet Take 1-2 tablets (50-100 mg total) by mouth 2 (two) times daily with or immediately following a meal.   Probiotic Product (PROBIOTIC-10 PO) Take 1 capsule by mouth daily. 25  billion   SIMPLY SALINE NA Place 1 each into the nose daily.    No facility-administered encounter medications on file as of 12/17/2023.    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   Past Surgical History:  Procedure Laterality Date   APPENDECTOMY  1989   atypical nevus excision     BREAST BIOPSY Right    CHOLECYSTECTOMY  1989   COLONOSCOPY WITH PROPOFOL  N/A 05/27/2015   Procedure: COLONOSCOPY WITH PROPOFOL ;  Surgeon: Oliva Boots, MD;  Location: [facility];  Service: Endoscopy;  Laterality: N/A;   CYSTOSCOPY W/ URETERAL STENT REMOVAL  1999   ESOPHAGOGASTRODUODENOSCOPY  2011   with esophageal stent, perforation    ESOPHAGOGASTRODUODENOSCOPY (EGD) WITH PROPOFOL  N/A 05/27/2015   Procedure: ESOPHAGOGASTRODUODENOSCOPY (EGD) WITH PROPOFOL ;  Surgeon: Oliva Boots, MD;  Location: [facility];  Service: Endoscopy;  Laterality: N/A;   HEMORRHOID SURGERY  2005   LEFT OOPHORECTOMY  1999   complicated by ureteral injuery requiring a ureteral stent    MMK / ANTERIOR VESICOURETHROPEXY / URETHROPEXY  2000   TONSILLECTOMY AND ADENOIDECTOMY     TOTAL ABDOMINAL HYSTERECTOMY  2000   with USO   TUBAL LIGATION  1981   wisdom teeth removal  1964   Family History  Problem Relation Age of Onset   Osteoporosis Mother    Transient  ischemic attack Mother    Cancer Mother        colon   Heart disease Mother        afib   Hyperlipidemia Mother    Colon polyps Father    Hypertension Father    Heart failure Father    Melanoma Father        multiple   Heart attack Father 53       x2   Heart disease Father        Bundle Block   Meniere's disease Father    Cancer Father        melanoma   Obesity Brother    Stroke Maternal Grandmother    Cancer Maternal Grandmother        skin, melanoma, sarcoma   Colon polyps Maternal Grandmother    Diverticulitis Maternal Grandmother    Stroke Maternal Grandfather    Heart disease Maternal Grandfather  Diabetes Paternal Grandmother    Social History   Socioeconomic History   Marital status: Married    Spouse name: Not on file   Number of children: Not on file   Years of education: Not on file   Highest education level: Not on file  Occupational History   Not on file  Tobacco Use   Smoking status: Never   Smokeless tobacco: Never  Vaping Use   Vaping status: Never Used  Substance and Sexual Activity   Alcohol use: No   Drug use: No   Sexual activity: Yes    Birth control/protection: Surgical    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, avoids spicy  Other Topics Concern   Not on file  Social History Narrative   Retired   6 grand kids   Daughter and son in [place]-    Social Drivers of Health   Financial Resource Strain: Low Risk  (12/17/2023)   Overall Financial Resource Strain (CARDIA)    Difficulty of Paying Living Expenses: Not very hard  Food Insecurity: No Food Insecurity (12/17/2023)   Hunger Vital Sign    Worried About Running Out of Food in the Last Year: Never true    Ran Out of Food in the Last Year: Never true  Transportation Needs: No Transportation Needs (12/17/2023)   PRAPARE - Administrator, Civil Service (Medical): No    Lack of Transportation (Non-Medical): No  Physical Activity: Inactive (12/17/2023)    Exercise Vital Sign    Days of Exercise per Week: 0 days    Minutes of Exercise per Session: 0 min  Stress: No Stress Concern Present (12/17/2023)   [employer] of Occupational Health - Occupational Stress Questionnaire    Feeling of Stress: Not at all  Social Connections: Socially Integrated (12/17/2023)   Social Connection and Isolation Panel    Frequency of Communication with Friends and Family: More than three times a week    Frequency of Social Gatherings with Friends and Family: More than three times a week    Attends Religious Services: More than 4 times per year    Active Member of [employer] or Organizations: Yes    Attends Engineer, structural: More than 4 times per year    Marital Status: Married    Tobacco Counseling Counseling given: Not Answered    Clinical Intake:  Pre-visit preparation completed: Yes  Pain : No/denies pain     BMI - recorded: 27.76 Nutritional Status: BMI 25 -29 Overweight Nutritional Risks: None Diabetes: No  Lab Results  Component Value Date   HGBA1C 6.1 11/28/2023   HGBA1C 5.9 08/15/2023   HGBA1C 5.8 02/07/2023     How often do you need to have someone help you when you read instructions, pamphlets, or other written materials from your doctor or pharmacy?: 1 - Never  Interpreter Needed?: No  Information entered by :: Lolita Maree MARINER)   Activities of Daily Living     12/17/2023    9:31 AM  In your present state of health, do you have any difficulty performing the following activities:  Hearing? 1  Vision? 0  Difficulty concentrating or making decisions? 0  Walking or climbing stairs? 0  Dressing or bathing? 0  Doing errands, shopping? 0  Preparing Food and eating ? N  Using the Toilet? N  In the past six months, have you accidently leaked urine? N  Do you have problems with loss of bowel control? N  Managing your Medications?  N  Managing your Finances? N  Housekeeping or managing your Housekeeping? Y   Comment can do basic cleaning but limited by balance issue    Patient Care Team: Domenica Harlene LABOR, MD as PCP - General (Family Medicine) Rosalie Kitchens, MD as Consulting Physician (Gastroenterology) Kit Rush, MD as Consulting Physician (Orthopedic Surgery) Melodi Lerner, MD as Consulting Physician (Orthopedic Surgery)  I have updated your Care Teams any recent Medical Services you may have received from other providers in the past year.     Assessment:   This is a routine wellness examination for [place].  Hearing/Vision screen Hearing Screening - Comments:: Has noted some decrease. Vision Screening - Comments:: Up to date with routine eye exams with [facility]    Goals Addressed               This Visit's Progress     Patient Stated (pt-stated)        To improve balance and be able to be more physically active       Depression Screen     11/28/2023   10:59 AM 08/22/2023   10:10 AM 08/13/2022   10:41 AM 02/21/2022    2:01 PM 02/12/2022    9:27 AM 10/12/2021   11:20 AM 02/23/2021   10:58 AM  PHQ 2/9 Scores  PHQ - 2 Score 0 0 0 0 0 0 0  PHQ- 9 Score 11  0      Pt reports no changes since 11/28/23.   Fall Risk     12/17/2023   10:06 AM 11/28/2023   10:59 AM 08/22/2023   10:10 AM 08/13/2022   10:41 AM 02/21/2022    2:04 PM  Fall Risk   Falls in the past year? 0 0 0 0 0  Number falls in past yr: 0 0 0 0 0  Injury with Fall? 0 0 0 0 0  Risk for fall due to : Impaired balance/gait;History of fall(s);Orthopedic patient No Fall Risks   No Fall Risks  Follow up Education provided Falls evaluation completed Falls evaluation completed;Education provided Falls evaluation completed Falls evaluation completed      Data saved with a previous flowsheet row definition  Pt reports no change since 11/28/23.  MEDICARE RISK AT HOME:  Medicare Risk at Home Any stairs in or around the home?: Yes If so, are there any without handrails?: No Home free of loose throw rugs  in walkways, pet beds, electrical cords, etc?: Yes Adequate lighting in your home to reduce risk of falls?: Yes Life alert?: No Use of a cane, walker or w/c?: Yes (has cane for rare use) Grab bars in the bathroom?: Yes Shower chair or bench in shower?: Yes Elevated toilet seat or a handicapped toilet?: Yes  TIMED UP AND GO:  Was the test performed?  No,audio  Cognitive Function: 6CIT completed        12/17/2023    8:49 AM 02/21/2022    2:16 PM  6CIT Screen  What Year? 0 points 0 points  What month? 0 points 0 points  What time? 0 points 0 points  Count back from 20 0 points 0 points  Months in reverse 0 points 0 points  Repeat phrase 0 points 0 points  Total Score 0 points 0 points    Immunizations Immunization History  Administered Date(s) Administered   Fluad  Quad(high Dose 65+) 12/29/2021   Fluad  Trivalent(High Dose 65+) 01/11/2023   Hepatitis A 01/21/2004, 10/12/2004   Hepatitis B 01/14/2004, 03/01/2004, 10/12/2004  INFLUENZA, HIGH DOSE SEASONAL PF 12/19/2015, 01/07/2017, 01/05/2019   Influenza Split 12/28/2014, 12/31/2019, 01/10/2021   Influenza Whole 12/27/2010   Influenza-Unspecified 12/25/2011, 12/09/2012, 01/11/2014   PFIZER Comirnaty ETTERGray Top)Covid-19 Tri-Sucrose Vaccine 07/22/2020   PFIZER(Purple Top)SARS-COV-2 Vaccination 04/04/2019, 04/24/2019, 01/23/2020, 02/05/2022   Pfizer Covid-19 Vaccine Bivalent Booster 66yrs & up 01/25/2021   Pfizer(Comirnaty )Fall Seasonal Vaccine 12 years and older 02/05/2022, 01/31/2023   Pneumococcal Conjugate-13 09/30/2015   Pneumococcal Polysaccharide-23 11/23/2016   Respiratory Syncytial Virus Vaccine ,Recomb Aduvanted(Arexvy ) 04/10/2022   Td 05/24/1997, 07/11/2007   Tdap 11/15/2017, 10/14/2020   Zoster Recombinant(Shingrix ) 03/01/2017, 07/26/2017   Zoster, Live 12/26/2012    Screening Tests Health Maintenance  Topic Date Due   Influenza Vaccine  10/25/2023   COVID-19 Vaccine (8 - Pfizer risk 2024-25 season) 03/25/2024  (Originally 11/25/2023)   Medicare Annual Wellness (AWV)  12/16/2024   Mammogram  01/14/2025   Colonoscopy  02/01/2029   DTaP/Tdap/Td (5 - Td or Tdap) 10/15/2030   Pneumococcal Vaccine: 50+ Years  Completed   DEXA SCAN  Completed   Hepatitis C Screening  Completed   Zoster Vaccines- Shingrix   Completed   HPV VACCINES  Aged Out   Meningococcal B Vaccine  Aged Out   Hepatitis B Vaccines 19-59 Average Risk  Discontinued    Health Maintenance Items Addressed: Will get flu vaccine in October and COVID at pharmacy.  Additional Screening:  Vision Screening: Recommended annual ophthalmology exams for early detection of glaucoma and other disorders of the eye. Is the patient up to date with their annual eye exam?  Yes  Who is the provider or what is the name of the office in which the patient attends annual eye exams? [facility]  Dental Screening: Recommended annual dental exams for proper oral hygiene  Community Resource Referral / Chronic Care Management: CRR required this visit?  No   CCM required this visit?  No   Plan:    I have personally reviewed and noted the following in the patient's chart:   Medical and social history Use of alcohol, tobacco or illicit drugs  Current medications and supplements including opioid prescriptions. Patient is not currently taking opioid prescriptions. Functional ability and status Nutritional status Physical activity Advanced directives List of other physicians Hospitalizations, surgeries, and ER visits in previous 12 months Vitals Screenings to include cognitive, depression, and falls Referrals and appointments  In addition, I have reviewed and discussed with patient certain preventive protocols, quality metrics, and best practice recommendations. A written personalized care plan for preventive services as well as general preventive health recommendations were provided to patient.   Lolita Libra, CMA   12/17/2023    After Visit Summary: (MyChart) Due to this being a telephonic visit, the after visit summary with patients personalized plan was offered to patient via MyChart   Notes: see phone note

## 2023-12-17 NOTE — Telephone Encounter (Signed)
 Pt had AWV today.  She notes that her balance is not improving. Has been to PT for her vertigo and it helped but balance has not improved. She has also been doing PT for her ankle and notes ankle is improving. Balance continues to be off.  She is wondering if there is anything else to do for further work up?  Feels she needs a cane for stability and is wondering if she can get a prescription for insurance coverage?  Wanted to update you that she sees Dr Roseann annually and she has had some microscopic hematuria and states workup has not indicated a malignant cause and they will monitor with another scan next year.

## 2023-12-19 ENCOUNTER — Other Ambulatory Visit: Payer: Self-pay | Admitting: Family Medicine

## 2023-12-19 DIAGNOSIS — R42 Dizziness and giddiness: Secondary | ICD-10-CM

## 2023-12-19 DIAGNOSIS — R296 Repeated falls: Secondary | ICD-10-CM

## 2023-12-19 DIAGNOSIS — R2681 Unsteadiness on feet: Secondary | ICD-10-CM

## 2023-12-19 NOTE — Telephone Encounter (Signed)
 Notified pt of below.  She will get cane recommendation from her PT. She is willing to proceed with CT head.  Will that be with or without contrast and I can place the order?

## 2023-12-20 ENCOUNTER — Ambulatory Visit (HOSPITAL_BASED_OUTPATIENT_CLINIC_OR_DEPARTMENT_OTHER)
Admission: RE | Admit: 2023-12-20 | Discharge: 2023-12-20 | Disposition: A | Discharge status: Home / Self Care | Source: Ambulatory Visit | Attending: Family Medicine | Admitting: Family Medicine

## 2023-12-20 DIAGNOSIS — R42 Dizziness and giddiness: Secondary | ICD-10-CM | POA: Insufficient documentation

## 2023-12-20 DIAGNOSIS — R270 Ataxia, unspecified: Secondary | ICD-10-CM | POA: Diagnosis not present

## 2023-12-20 DIAGNOSIS — R2681 Unsteadiness on feet: Secondary | ICD-10-CM | POA: Insufficient documentation

## 2023-12-20 DIAGNOSIS — R296 Repeated falls: Secondary | ICD-10-CM | POA: Insufficient documentation

## 2023-12-22 ENCOUNTER — Ambulatory Visit: Payer: Self-pay | Admitting: Family Medicine

## 2023-12-23 NOTE — Progress Notes (Signed)
 Patient reviewed via MyChart.

## 2023-12-24 ENCOUNTER — Other Ambulatory Visit (HOSPITAL_BASED_OUTPATIENT_CLINIC_OR_DEPARTMENT_OTHER): Payer: Self-pay

## 2023-12-24 DIAGNOSIS — M25572 Pain in left ankle and joints of left foot: Secondary | ICD-10-CM | POA: Diagnosis not present

## 2023-12-24 DIAGNOSIS — M79672 Pain in left foot: Secondary | ICD-10-CM | POA: Diagnosis not present

## 2023-12-25 DIAGNOSIS — M76822 Posterior tibial tendinitis, left leg: Secondary | ICD-10-CM | POA: Diagnosis not present

## 2023-12-25 DIAGNOSIS — M25872 Other specified joint disorders, left ankle and foot: Secondary | ICD-10-CM | POA: Diagnosis not present

## 2023-12-26 DIAGNOSIS — M65932 Unspecified synovitis and tenosynovitis, left forearm: Secondary | ICD-10-CM | POA: Diagnosis not present

## 2023-12-26 DIAGNOSIS — M67432 Ganglion, left wrist: Secondary | ICD-10-CM | POA: Diagnosis not present

## 2023-12-27 DIAGNOSIS — H2513 Age-related nuclear cataract, bilateral: Secondary | ICD-10-CM | POA: Diagnosis not present

## 2024-01-01 DIAGNOSIS — M25572 Pain in left ankle and joints of left foot: Secondary | ICD-10-CM | POA: Diagnosis not present

## 2024-01-01 DIAGNOSIS — M79672 Pain in left foot: Secondary | ICD-10-CM | POA: Diagnosis not present

## 2024-01-06 DIAGNOSIS — M79672 Pain in left foot: Secondary | ICD-10-CM | POA: Diagnosis not present

## 2024-01-06 DIAGNOSIS — M25572 Pain in left ankle and joints of left foot: Secondary | ICD-10-CM | POA: Diagnosis not present

## 2024-01-13 ENCOUNTER — Other Ambulatory Visit (HOSPITAL_BASED_OUTPATIENT_CLINIC_OR_DEPARTMENT_OTHER): Payer: Self-pay

## 2024-01-16 ENCOUNTER — Other Ambulatory Visit (HOSPITAL_BASED_OUTPATIENT_CLINIC_OR_DEPARTMENT_OTHER): Payer: Self-pay

## 2024-01-20 ENCOUNTER — Ambulatory Visit
Admission: RE | Admit: 2024-01-20 | Discharge: 2024-01-20 | Disposition: A | Discharge status: Home / Self Care | Source: Ambulatory Visit | Attending: Family Medicine | Admitting: Family Medicine

## 2024-01-20 DIAGNOSIS — Z1231 Encounter for screening mammogram for malignant neoplasm of breast: Secondary | ICD-10-CM

## 2024-01-21 ENCOUNTER — Ambulatory Visit

## 2024-01-22 ENCOUNTER — Other Ambulatory Visit (HOSPITAL_BASED_OUTPATIENT_CLINIC_OR_DEPARTMENT_OTHER): Payer: Self-pay

## 2024-01-22 DIAGNOSIS — Z23 Encounter for immunization: Secondary | ICD-10-CM | POA: Diagnosis not present

## 2024-01-22 MED ORDER — FLUZONE HIGH-DOSE 0.5 ML IM SUSY
0.5000 mL | PREFILLED_SYRINGE | Freq: Once | INTRAMUSCULAR | 0 refills | Status: AC
  Filled 2024-01-22: qty 0.5, 1d supply, fill #0

## 2024-01-23 ENCOUNTER — Ambulatory Visit: Payer: Self-pay | Admitting: Family Medicine

## 2024-01-23 DIAGNOSIS — M79672 Pain in left foot: Secondary | ICD-10-CM | POA: Diagnosis not present

## 2024-01-23 DIAGNOSIS — M25572 Pain in left ankle and joints of left foot: Secondary | ICD-10-CM | POA: Diagnosis not present

## 2024-01-27 DIAGNOSIS — M25572 Pain in left ankle and joints of left foot: Secondary | ICD-10-CM | POA: Diagnosis not present

## 2024-01-27 DIAGNOSIS — M79672 Pain in left foot: Secondary | ICD-10-CM | POA: Diagnosis not present

## 2024-02-06 DIAGNOSIS — Z961 Presence of intraocular lens: Secondary | ICD-10-CM | POA: Diagnosis not present

## 2024-02-06 DIAGNOSIS — I10 Essential (primary) hypertension: Secondary | ICD-10-CM | POA: Diagnosis not present

## 2024-02-06 DIAGNOSIS — H2512 Age-related nuclear cataract, left eye: Secondary | ICD-10-CM | POA: Diagnosis not present

## 2024-02-06 DIAGNOSIS — H25812 Combined forms of age-related cataract, left eye: Secondary | ICD-10-CM | POA: Diagnosis not present

## 2024-02-12 ENCOUNTER — Other Ambulatory Visit: Payer: Self-pay

## 2024-02-12 ENCOUNTER — Other Ambulatory Visit (HOSPITAL_BASED_OUTPATIENT_CLINIC_OR_DEPARTMENT_OTHER): Payer: Self-pay

## 2024-02-12 MED ORDER — PEG 3350-KCL-NA BICARB-NACL 420 G PO SOLR
4000.0000 mL | Freq: Once | ORAL | 0 refills | Status: AC
  Filled 2024-02-12: qty 4000, 1d supply, fill #0

## 2024-02-13 ENCOUNTER — Other Ambulatory Visit (HOSPITAL_BASED_OUTPATIENT_CLINIC_OR_DEPARTMENT_OTHER): Payer: Self-pay

## 2024-02-13 DIAGNOSIS — Z961 Presence of intraocular lens: Secondary | ICD-10-CM | POA: Diagnosis not present

## 2024-02-13 DIAGNOSIS — H25811 Combined forms of age-related cataract, right eye: Secondary | ICD-10-CM | POA: Diagnosis not present

## 2024-02-13 DIAGNOSIS — H2511 Age-related nuclear cataract, right eye: Secondary | ICD-10-CM | POA: Diagnosis not present

## 2024-02-27 ENCOUNTER — Ambulatory Visit: Admitting: Family Medicine

## 2024-02-27 DIAGNOSIS — D126 Benign neoplasm of colon, unspecified: Secondary | ICD-10-CM | POA: Diagnosis not present

## 2024-02-27 DIAGNOSIS — Z09 Encounter for follow-up examination after completed treatment for conditions other than malignant neoplasm: Secondary | ICD-10-CM | POA: Diagnosis not present

## 2024-02-27 DIAGNOSIS — K449 Diaphragmatic hernia without obstruction or gangrene: Secondary | ICD-10-CM | POA: Diagnosis not present

## 2024-02-27 DIAGNOSIS — K5730 Diverticulosis of large intestine without perforation or abscess without bleeding: Secondary | ICD-10-CM | POA: Diagnosis not present

## 2024-02-27 DIAGNOSIS — D125 Benign neoplasm of sigmoid colon: Secondary | ICD-10-CM | POA: Diagnosis not present

## 2024-02-27 DIAGNOSIS — Z800 Family history of malignant neoplasm of digestive organs: Secondary | ICD-10-CM | POA: Diagnosis not present

## 2024-02-27 DIAGNOSIS — K317 Polyp of stomach and duodenum: Secondary | ICD-10-CM | POA: Diagnosis not present

## 2024-02-27 DIAGNOSIS — Z860101 Personal history of adenomatous and serrated colon polyps: Secondary | ICD-10-CM | POA: Diagnosis not present

## 2024-02-27 DIAGNOSIS — K635 Polyp of colon: Secondary | ICD-10-CM | POA: Diagnosis not present

## 2024-03-02 ENCOUNTER — Other Ambulatory Visit (HOSPITAL_BASED_OUTPATIENT_CLINIC_OR_DEPARTMENT_OTHER): Payer: Self-pay

## 2024-03-02 DIAGNOSIS — Z23 Encounter for immunization: Secondary | ICD-10-CM | POA: Diagnosis not present

## 2024-03-02 MED ORDER — COMIRNATY 30 MCG/0.3ML IM SUSY
0.3000 mL | PREFILLED_SYRINGE | Freq: Once | INTRAMUSCULAR | 0 refills | Status: AC
  Filled 2024-03-02: qty 0.3, 1d supply, fill #0

## 2024-03-03 NOTE — Progress Notes (Unsigned)
 Subjective:     Patient ID: Carrie Elliott, female    DOB: [birth year], 73 y.o.   MRN: [id number]  No chief complaint on file.   HPI Carrie Elliott is a 73 y.o. female who presents for follow up on chronic conditions. She is followed by GI, Cardiology, and Urology.  Recently seen by Cardiology-Dr. Burney for Echo in August.  Hypertension Amlodipine  2.5 mg daily, lisinopril  10 mg twice daily, metoprolol  succinate 50 mg twice daily. Compliant with medications.  Taking BP at home and readings are well-controlled.  GERD: -Meds: On omeprazole (Nexium ) 40 mg daily and famotidine  (Pepcid ) 20 mg twice daily. Followed by GI: Dr. Caffie Ee [facility].  Colonoscopy and Endoscopy scheduled November 2025 -Compliant: yes -Reports symptoms are controlled current medications     Patient denies fever, chills, SOB, CP, palpitations, syncope, dyspnea, edema, HA, vision changes, N/V/D, abdominal pain, urinary symptoms, rash, weight changes, and recent illness or hospitalizations.       History of Present Illness              There are no preventive care reminders to display for this patient.   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    Past Surgical History:  Procedure Laterality Date   APPENDECTOMY  1989   atypical nevus excision     BREAST BIOPSY Right    CHOLECYSTECTOMY  1989   COLONOSCOPY WITH PROPOFOL  N/A 05/27/2015   Procedure: COLONOSCOPY  WITH PROPOFOL ;  Surgeon: Oliva Boots, MD;  Location: [facility];  Service: Endoscopy;  Laterality: N/A;   CYSTOSCOPY W/ URETERAL STENT REMOVAL  1999   ESOPHAGOGASTRODUODENOSCOPY  2011   with esophageal stent, perforation    ESOPHAGOGASTRODUODENOSCOPY (EGD) WITH PROPOFOL  N/A 05/27/2015   Procedure: ESOPHAGOGASTRODUODENOSCOPY (EGD) WITH PROPOFOL ;  Surgeon: Oliva Boots, MD;  Location: [facility];  Service: Endoscopy;  Laterality: N/A;   HEMORRHOID SURGERY  2005   LEFT OOPHORECTOMY  1999   complicated by ureteral injuery requiring a ureteral stent    MMK / ANTERIOR VESICOURETHROPEXY / URETHROPEXY  2000   TONSILLECTOMY AND ADENOIDECTOMY     TOTAL ABDOMINAL HYSTERECTOMY  2000   with USO   TUBAL LIGATION  1981   wisdom teeth removal  1964    Family History  Problem Relation Age of Onset   Osteoporosis Mother    Transient ischemic attack Mother    Cancer Mother        colon   Heart disease Mother        afib   Hyperlipidemia Mother    Colon polyps Father    Hypertension Father    Heart failure Father    Melanoma Father        multiple   Heart attack Father 74       x2   Heart disease Father        Bundle Block  Meniere's disease Father    Cancer Father        melanoma   Obesity Brother    Stroke Maternal Grandmother    Cancer Maternal Grandmother        skin, melanoma, sarcoma   Colon polyps Maternal Grandmother    Diverticulitis Maternal Grandmother    Stroke Maternal Grandfather    Heart disease Maternal Grandfather    Diabetes Paternal Grandmother     Social History   Socioeconomic History   Marital status: Married    Spouse name: Not on file   Number of children: Not on file   Years of education: Not on file   Highest education level: Not on file  Occupational History   Not on file  Tobacco Use   Smoking status: Never   Smokeless tobacco: Never  Vaping Use   Vaping status: Never Used  Substance and Sexual Activity   Alcohol use: No   Drug use: No    Sexual activity: Yes    Birth control/protection: Surgical    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, avoids spicy  Other Topics Concern   Not on file  Social History Narrative   Retired   6 grand kids   Daughter and son in [place]-    Social Drivers of Health   Financial Resource Strain: Low Risk  (12/17/2023)   Overall Financial Resource Strain (CARDIA)    Difficulty of Paying Living Expenses: Not very hard  Food Insecurity: No Food Insecurity (12/17/2023)   Hunger Vital Sign    Worried About Running Out of Food in the Last Year: Never true    Ran Out of Food in the Last Year: Never true  Transportation Needs: No Transportation Needs (12/17/2023)   PRAPARE - Administrator, Civil Service (Medical): No    Lack of Transportation (Non-Medical): No  Physical Activity: Inactive (12/17/2023)   Exercise Vital Sign    Days of Exercise per Week: 0 days    Minutes of Exercise per Session: 0 min  Stress: No Stress Concern Present (12/17/2023)   [employer] of Occupational Health - Occupational Stress Questionnaire    Feeling of Stress: Not at all  Social Connections: Socially Integrated (12/17/2023)   Social Connection and Isolation Panel    Frequency of Communication with Friends and Family: More than three times a week    Frequency of Social Gatherings with Friends and Family: More than three times a week    Attends Religious Services: More than 4 times per year    Active Member of [employer] or Organizations: Yes    Attends Engineer, Structural: More than 4 times per year    Marital Status: Married  Catering Manager Violence: Not At Risk (12/17/2023)   Humiliation, Afraid, Rape, and Kick questionnaire    Fear of Current or Ex-Partner: No    Emotionally Abused: No    Physically Abused: No    Sexually Abused: No    Outpatient Medications Prior to Visit  Medication Sig Dispense Refill   acetaminophen  (TYLENOL ) 500 MG tablet Take 1,000 mg by mouth  every 6 (six) hours as needed.     amLODipine  (NORVASC ) 2.5 MG tablet Take 1 tablet (2.5 mg total) by mouth daily. 90 tablet 3   Calcium Citrate (CAL-CITRATE PO) Take 2 tablets by mouth 3 (three) times a week.     Cholecalciferol (VITAMIN D3) 50 MCG (2000 UT) TABS Take by mouth. Take 1 tablet daily.     COVID-19  mRNA vaccine, Pfizer, (COMIRNATY ) syringe Inject 0.3 mLs into the muscle once for 1 dose. 0.3 mL 0   esomeprazole  (NEXIUM ) 40 MG capsule Take 1 capsule (40 mg total) by mouth 1/2 to 1 hour before morning meal every morning 90 capsule 3   famotidine  (PEPCID ) 20 MG tablet Take 1 tablet (20 mg total) by mouth 2 (two) times daily for 90 days 180 tablet 3   fluticasone  (FLONASE ) 50 MCG/ACT nasal spray Place 2 sprays into both nostrils daily. 16 g 6   Lidocaine -Hydrocortisone  Ace 3-0.5 % KIT Apply 1 Application topically daily as needed. 1 kit 2   lisinopril  (ZESTRIL ) 10 MG tablet Take 1 tablet (10 mg total) by mouth 2 (two) times daily. 180 tablet 3   Loperamide-Simethicone  2-125 MG TABS Take by mouth as needed.     metoprolol  succinate (TOPROL -XL) 50 MG 24 hr tablet Take 1-2 tablets (50-100 mg total) by mouth 2 (two) times daily with or immediately following a meal. 360 tablet 1   Probiotic Product (PROBIOTIC-10 PO) Take 1 capsule by mouth daily. 25 billion     SIMPLY SALINE NA Place 1 each into the nose daily.      No facility-administered medications prior to visit.    Allergies  Allergen Reactions   Pseudoephedrine Hcl Er Other (See Comments)    TACHYCARDIA    Singulair [Montelukast Sodium] Other (See Comments)    Mental status changes, irritable   Cefdinir Hives   Celebrex [Celecoxib] Other (See Comments)    ABDOMINAL PAIN    Ciprofloxacin Other (See Comments)    ABDOMINAL PAIN    Codeine Nausea And Vomiting    NAUSEA VOMITING - can take with zofran     ROS    See HPI Objective:     Physical Exam  General: No acute distress. Awake and conversant.  Respiratory: CTAB.  Respirations are non-labored. No wheezing.  Skin: Warm. Dry. Intact  Psych: Alert and oriented. Cooperative, Appropriate mood and affect, Normal judgment.  CV: RRR. No lower extremity edema.  MSK: Normal ambulation. No clubbing or cyanosis. +L Ankle edema +1, slight limited ROM in inversion,eversion,flexion,extension, TWP  lateral malleolus. No ecchymosis or erythema. Skin dry,intact.  Neuro: CN II-XII grossly normal.     There were no vitals taken for this visit. Wt Readings from Last 3 Encounters:  12/17/23 172 lb (78 kg)  11/28/23 172 lb (78 kg)  10/23/23 171 lb (77.6 kg)       Assessment & Plan:   Problem List Items Addressed This Visit   None   Patient was educated on the diagnosis, treatment options, potential risks, benefits, and alternatives. All questions were addressed. Patient verbalized understanding and agrees with the plan of care. Will follow up as advised or sooner if symptoms worsen or new concerns arise.   Portions of this note were dictated using DRAGON voice recognition software. Please disregard any errors in transcription.    I am having Carrie RAMAN. Wolter maintain her SIMPLY SALINE NA, Vitamin D3, famotidine , esomeprazole , Lidocaine -Hydrocortisone  Ace, fluticasone , Probiotic Product (PROBIOTIC-10 PO), Calcium Citrate (CAL-CITRATE PO), acetaminophen , Loperamide-Simethicone , lisinopril , metoprolol  succinate, amLODipine , and Comirnaty .  No orders of the defined types were placed in this encounter.

## 2024-03-04 ENCOUNTER — Telehealth (INDEPENDENT_AMBULATORY_CARE_PROVIDER_SITE_OTHER): Payer: Self-pay

## 2024-03-04 NOTE — Telephone Encounter (Signed)
 Patient left a message that her pcp had sent in a referral for her to be seen by Teoh. She has seen him in the past. She wants to know if she can be schedule and be seen for nose bleeds, vocial cord polyp's and reflux.  I sent a message to referral crew.

## 2024-03-05 ENCOUNTER — Other Ambulatory Visit: Payer: Self-pay | Admitting: *Deleted

## 2024-03-05 ENCOUNTER — Other Ambulatory Visit (HOSPITAL_BASED_OUTPATIENT_CLINIC_OR_DEPARTMENT_OTHER): Payer: Self-pay

## 2024-03-05 ENCOUNTER — Encounter: Payer: Self-pay | Admitting: Student

## 2024-03-05 ENCOUNTER — Ambulatory Visit: Admitting: Student

## 2024-03-05 VITALS — BP 134/80 | HR 66 | Ht 60.0 in | Wt 175.0 lb

## 2024-03-05 DIAGNOSIS — T7840XS Allergy, unspecified, sequela: Secondary | ICD-10-CM | POA: Diagnosis not present

## 2024-03-05 DIAGNOSIS — I10 Essential (primary) hypertension: Secondary | ICD-10-CM

## 2024-03-05 DIAGNOSIS — E785 Hyperlipidemia, unspecified: Secondary | ICD-10-CM

## 2024-03-05 DIAGNOSIS — M8580 Other specified disorders of bone density and structure, unspecified site: Secondary | ICD-10-CM

## 2024-03-05 DIAGNOSIS — G4730 Sleep apnea, unspecified: Secondary | ICD-10-CM

## 2024-03-05 DIAGNOSIS — R531 Weakness: Secondary | ICD-10-CM | POA: Insufficient documentation

## 2024-03-05 DIAGNOSIS — R7303 Prediabetes: Secondary | ICD-10-CM

## 2024-03-05 DIAGNOSIS — E66811 Obesity, class 1: Secondary | ICD-10-CM | POA: Diagnosis not present

## 2024-03-05 DIAGNOSIS — T7840XA Allergy, unspecified, initial encounter: Secondary | ICD-10-CM | POA: Insufficient documentation

## 2024-03-05 DIAGNOSIS — Z6834 Body mass index (BMI) 34.0-34.9, adult: Secondary | ICD-10-CM | POA: Diagnosis not present

## 2024-03-05 MED ORDER — FLUTICASONE PROPIONATE 50 MCG/ACT NA SUSP
2.0000 | Freq: Every day | NASAL | 3 refills | Status: AC
  Filled 2024-03-05: qty 48, 90d supply, fill #0

## 2024-03-05 MED ORDER — FLUTICASONE PROPIONATE 50 MCG/ACT NA SUSP
2.0000 | Freq: Every day | NASAL | 6 refills | Status: DC
  Filled 2024-03-05: qty 16, 30d supply, fill #0

## 2024-03-05 NOTE — Assessment & Plan Note (Signed)
 Pt re

## 2024-03-05 NOTE — Assessment & Plan Note (Signed)
 Encouraged DASH or MIND diet, decrease po intake and increase exercise as tolerated. Needs 7-8 hours of sleep nightly. Avoid trans fats, eat small, frequent meals every 4-5 hours with lean proteins, complex carbs and healthy fats. Minimize simple carbs, high fat foods and processed foods. -Consider [facility]. Pt declines at this time.

## 2024-03-05 NOTE — Assessment & Plan Note (Signed)
 Well controlled, no changes to meds. Encouraged heart healthy diet such as the DASH diet and exercise as tolerated.

## 2024-03-05 NOTE — Assessment & Plan Note (Signed)
 Encourage heart healthy diet such as MIND or DASH diet, increase exercise, avoid trans fats, simple carbohydrates and processed foods, consider a krill or fish or flaxseed oil cap daily.   Update Lipid Panel.

## 2024-03-05 NOTE — Assessment & Plan Note (Addendum)
-   Continue cetirizine 10 mg daily. - Continue fluticasone  nasal spray, 2 sprays each nostril in the morning. - Consider switching to Allegra  if symptoms persist. - Use saline spray for nasal moisture. - Use humidifier to prevent dryness.

## 2024-03-05 NOTE — Assessment & Plan Note (Signed)
 Stable on CPAP

## 2024-03-09 LAB — HM COLONOSCOPY

## 2024-03-25 ENCOUNTER — Other Ambulatory Visit (HOSPITAL_BASED_OUTPATIENT_CLINIC_OR_DEPARTMENT_OTHER): Payer: Self-pay

## 2024-03-25 ENCOUNTER — Encounter: Payer: Self-pay | Admitting: Family

## 2024-03-25 MED ORDER — VOQUEZNA 20 MG PO TABS
20.0000 mg | ORAL_TABLET | Freq: Every day | ORAL | 0 refills | Status: DC
  Filled 2024-03-25 – 2024-04-01 (×2): qty 30, 30d supply, fill #0

## 2024-04-01 ENCOUNTER — Encounter: Payer: Self-pay | Admitting: Family

## 2024-04-01 ENCOUNTER — Ambulatory Visit (INDEPENDENT_AMBULATORY_CARE_PROVIDER_SITE_OTHER)

## 2024-04-01 ENCOUNTER — Other Ambulatory Visit (HOSPITAL_BASED_OUTPATIENT_CLINIC_OR_DEPARTMENT_OTHER): Payer: Self-pay

## 2024-04-01 VITALS — BP 112/73 | HR 74 | Wt 175.0 lb

## 2024-04-01 DIAGNOSIS — K219 Gastro-esophageal reflux disease without esophagitis: Secondary | ICD-10-CM | POA: Diagnosis not present

## 2024-04-01 DIAGNOSIS — R040 Epistaxis: Secondary | ICD-10-CM

## 2024-04-01 DIAGNOSIS — R490 Dysphonia: Secondary | ICD-10-CM | POA: Diagnosis not present

## 2024-04-01 MED ORDER — SALINE SPRAY 0.65 % NA SOLN
2.0000 | Freq: Four times a day (QID) | NASAL | 12 refills | Status: AC
  Filled 2024-04-01: qty 44, 90d supply, fill #0

## 2024-04-01 MED ORDER — MUPIROCIN 2 % EX OINT
1.0000 | TOPICAL_OINTMENT | Freq: Two times a day (BID) | CUTANEOUS | 0 refills | Status: AC
  Filled 2024-04-01: qty 22, 11d supply, fill #0

## 2024-04-01 NOTE — Patient Instructions (Signed)
" °  VISIT SUMMARY: During your visit, we evaluated your vocal cord polyp, recurrent nosebleeds, and chronic laryngopharyngeal reflux. We discussed your symptoms and provided recommendations to manage each condition.  YOUR PLAN: -EPISTAXIS: Epistaxis means nosebleeds. Your recurrent nosebleeds are due to chronic nasal dryness and congestion, which are worsened by the use of intranasal fluticasone  and low humidity. We recommend using saline gel (Ayr gel) for nasal moisture and prescribed intranasal mupirocin  ointment (Bactroban ) to help with mucosal healing and reduce infection risk. Use saline sprays and ointment for five days after a nosebleed, and temporarily stop using fluticasone  if bleeding occurs. It's important to keep your nasal passages hydrated and avoid irritants.  -LARYNGOPHARYNGEAL REFLUX: Laryngopharyngeal reflux is a condition where stomach acid frequently flows back into the throat, causing irritation. 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 We provided you with a sample of Reflux Gourmet to use after meals and at bedtime. This over-the-counter product has no drug interactions and can be used with your current oral reflux medications. We also recommend staying upright after meals and elevating the head of your bed to reduce nighttime reflux. We will follow up in two months to see how you are responding to the therapy.  -HOARSENESS: Your vocal cord polyp, which was identified in the past, has resolved spontaneously, and there have been no recent changes in your voice. We documented the laryngoscopic findings with photographic evidence for your records and for communication with other providers. No further intervention is needed for your hoarseness or vocal cord pathology at this time.  INSTRUCTIONS: We will follow up in two months to assess your response to the reflux therapy. If you experience any new or worsening symptoms,  please contact our office.                      Contains text generated by Abridge.                                 Contains text generated by Abridge.   "

## 2024-04-01 NOTE — Progress Notes (Signed)
 Dear Dr. Domenica, Here is my assessment for our mutual patient, Carrie Elliott. Thank you for allowing me the opportunity to care for your patient. Please do not hesitate to contact me should you have any other questions. Sincerely, Dr. Penne Croak  Otolaryngology Clinic Note Referring provider: Dr. Domenica HPI:  Discussed the use of AI scribe software for clinical note transcription with the patient, who gave verbal consent to proceed.  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 - Serial follow-up has demonstrated stability in size and appearance. - No surgical intervention has been performed. - Annual surveillance recommended unless voice changes occur. - Recent upper and lower GI endoscopies in December 2025 unable to visualize polyp due to excessive mucus. - Retains video documentation of prior endoscopic visualization. - No recent changes in voice or worsening symptoms related to the polyp.  Epistaxis and nasal symptoms - Recurrent epistaxis since summer 2025, with intermittent episodes including several before Christmas and one to two in the weeks prior. - Bleeding is bilateral but more frequent on one side. - Chronic nasal congestion and dryness present. - Attributes symptoms to decreased humidity and medication use. - Uses fluticasone  nasal spray and cetirizine; often discontinues fluticasone  during episodes of irritation or bleeding due to increased dryness. - No prior use of mupirocin  ointment intranasally.  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 - Constant reflux with frequent throat and airway  irritation, cough, choking on saliva, and difficulty talking and drinking simultaneously. - History of hiatal hernia. - Avoids bending over and sleeping flat due to reflux. - Previously used proton pump inhibitors for decades but discontinued due to development of multiple gastric polyps. - Currently using Voquezna  samples in place of Nexium . - Trialed famotidine  40 mg twice daily without adequate symptom control. - Continues to experience significant burning and discomfort. - Seeking additional options for reflux control.   Independent Review of Additional Tests or Records:  Reviewed external note from referring PCP, Blyth,describing relevant history incorporated into todays evaluation.   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     Past Surgical History:  Procedure Laterality Date   APPENDECTOMY  1989   atypical nevus excision     BREAST BIOPSY Right    CHOLECYSTECTOMY  1989   COLONOSCOPY WITH PROPOFOL  N/A 05/27/2015   Procedure: COLONOSCOPY WITH PROPOFOL ;  Surgeon: Oliva Boots, MD;  Location: [facility];  Service: Endoscopy;  Laterality: N/A;   CYSTOSCOPY W/ URETERAL STENT REMOVAL  1999   ESOPHAGOGASTRODUODENOSCOPY  2011   with esophageal stent, perforation  ESOPHAGOGASTRODUODENOSCOPY (EGD) WITH PROPOFOL  N/A 05/27/2015   Procedure: ESOPHAGOGASTRODUODENOSCOPY (EGD) WITH PROPOFOL ;  Surgeon: Oliva Boots, MD;  Location: [facility];  Service: Endoscopy;  Laterality: N/A;   HEMORRHOID SURGERY  2005   LEFT OOPHORECTOMY  1999   complicated by ureteral injuery requiring a ureteral stent    MMK / ANTERIOR VESICOURETHROPEXY / URETHROPEXY  2000   TONSILLECTOMY AND ADENOIDECTOMY     TOTAL ABDOMINAL HYSTERECTOMY  2000   with USO   TUBAL LIGATION  1981   wisdom teeth removal  1964    Family History  Problem Relation Age of Onset   Osteoporosis Mother    Transient ischemic attack Mother    Cancer Mother        colon   Heart disease Mother        afib   Hyperlipidemia Mother    Colon polyps Father    Hypertension Father    Heart failure Father    Melanoma Father        multiple   Heart attack Father 41       x2   Heart disease Father        Bundle Block   Meniere's disease Father    Cancer Father        melanoma   Obesity Brother    Stroke Maternal Grandmother    Cancer Maternal Grandmother        skin, melanoma, sarcoma   Colon polyps Maternal Grandmother    Diverticulitis Maternal Grandmother    Stroke Maternal Grandfather    Heart disease Maternal Grandfather    Diabetes Paternal Grandmother      Social Connections: Socially Integrated (12/17/2023)   Social Connection and Isolation Panel    Frequency of Communication with Friends and Family: More than three times a week    Frequency of Social Gatherings with Friends and Family: More than three times a week    Attends Religious Services: More than 4 times per year    Active Member of [employer] or Organizations: Yes    Attends Engineer, Structural: More than 4 times per year    Marital Status: Married     Current Medications[1]   Physical Exam:   BP 112/73 (BP Location: Left Arm, Patient Position: Sitting, Cuff Size: Normal)   Pulse 74   Wt 175 lb (79.4 kg)   SpO2 95%   BMI 34.18 kg/m   The patient was awake, alert, and appropriate. The external ears were inspected, and otoscopy was performed to evaluate the external auditory  canals and tympanic membranes. The nasal cavity and septum were examined for mucosal changes, obstruction, or discharge. The oral cavity and oropharynx were inspected for mucosal lesions, infection, or tonsillar hypertrophy. The neck was palpated for lymphadenopathy, thyroid  abnormalities, or other masses. Cranial nerve function was grossly intact.  Pertinent Findings: General: Well developed, well nourished. No acute distress. Voice with hoarseness Head/Face: Normocephalic. No sinus tenderness. Facial nerve intact and equal bilaterally. No facial lacerations. Eyes: PERRL, no scleral icterus or conjunctival hemorrhage. EOMI. Ears: No gross deformity. Normal external canal. Tympanic membrane with normal landmarks bilaterally Hearing: Normal speech reception.  Nose: left DNS. No purulent discharge. mod turbinate hypertrophy.  Mouth/Oropharynx: Lips without any lesions. Dentition fair. No mucosal lesions within the oropharynx. No tonsillar enlargement, exudate, or lesions. Pharyngeal walls symmetrical. Uvula midline. Tongue midline without lesions. Larynx: See TFL if applicable Nasopharynx: See TFL if applicable Neck: Trachea midline. No masses. No thyromegaly or nodules palpated. No crepitus.  Lymphatic: No lymphadenopathy in the neck. Respiratory: No stridor or distress. Room air. Cardiovascular: Regular rate and rhythm. Extremities: No edema or cyanosis. Warm and well-perfused. Skin: No scars or lesions on face or neck. Neurologic: CN II-XII grossly intact. Moving all extremities without gross abnormality. Other:  Physical Exam HEENT: Atraumatic, normocephalic. Nose with deviated septum and dry, irritated nasal septum, bilateral. No vocal cord polyp.   Seprately Identifiable Procedures:  I personally ordered, reviewed and interpreted the following with the patient today  Given the patient's symptoms and incomplete visualization of critical sinonasal areas with anterior rhinoscopy, a  separately performed diagnostic nasal endoscopy procedure is indicated for a complete rhinologic evaluation per American Rhinologic Society recommendations (https://[email or web address]-rhinologic.org/position-statements)  I personally ordered, reviewed and interpreted the following with the patient today  Procedure Note Diagnostic Nasal Endoscopy CPT CODE -- 68768 - Mod 25  Prior to initiating any procedures, risks/benefits/alternatives were explained to the patient and verbal consent obtained.  Pre-procedure diagnosis: Concern for mass, epistaxis Post-procedure diagnosis: same Indication: See pre-procedure diagnosis and physical exam above Complications: None apparent EBL: 0 mL Anesthesia: Lidocaine  4% and topical decongestant was topically sprayed in each nasal cavity  Description of Procedure:  Patient was identified. A flexible fiberoptic endoscope was utilized to evaluate the sinonasal cavities, mucosa, sinus ostia and turbinates and septum.  Overall, signs of mucosal inflammation are noted.  Also noted are left DNS, raw anterior septum bilaterally.  No mucopurulence, polyps, or masses noted.   Right Middle meatus: clear Right SE Recess: clear Left MM: clear Left SE Recess: clear Photodocumentation was obtained.  Procedure Note Pre-procedure diagnosis:  Dysphonia and globus Post-procedure diagnosis: Same Procedure: Transnasal Fiberoptic Laryngoscopy, CPT 31575 - Mod 25 Indication: globus sensation, uncontrolled LPR Complications: None apparent EBL: 0 mL  The procedure was undertaken to further evaluate the patient's complaint of LRP, reflux, with mirror exam inadequate for appropriate examination due to gag reflex and poor patient tolerance  Procedure:  Patient was identified as correct patient. Verbal consent was obtained. The nose was sprayed with oxymetazoline and 4% lidocaine . The The flexible laryngoscope was passed through the nose to view the nasal cavity, pharynx (oropharynx,  hypopharynx) and larynx.  The larynx was examined at rest and during multiple phonatory tasks. Documentation was obtained and reviewed with patient. The scope was removed. The patient tolerated the procedure well.  Findings: The nasal cavity and nasopharynx did not reveal any masses or lesions, mucosa appeared to be without obvious lesions. The tongue base, pharyngeal walls, piriform sinuses, vallecula, epiglottis and postcricoid region are normal in appearance EXCEPT: postcricoid pachydermia and interarytenoid erythema and edema. The visualized portion of the subglottis and proximal trachea is widely patent. The vocal folds are mobile bilaterally. There are no lesions on the free edge of the vocal folds nor elsewhere in the larynx worrisome for malignancy.    Electronically signed by: Penne Croak, DO 04/01/2024 10:37 AM   Impression & Plans:  Carrie Elliott is a 74 y.o. female  1. Epistaxis   2. Laryngopharyngeal reflux (LPR)   3. Hoarseness    - Findings and diagnoses discussed in detail with the patient. - Risks, benefits, and alternatives were reviewed. Through shared decision making, the patient elects to proceed with below. Assessment & Plan Epistaxis Recurrent epistaxis due to chronic nasal dryness and congestion, exacerbated by intranasal fluticasone  and low humidity. - Advised saline gel (Ayr gel) for nasal moisture. - Prescribed intranasal mupirocin  ointment (Bactroban ) for mucosal healing and infection risk reduction. - Instructed saline  sprays and ointment use for five days post-epistaxis, with temporary fluticasone  discontinuation if bleeding occurs. - Reinforced nasal hydration and irritant avoidance.  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 Persistent symptoms despite discontinuation of proton pump inhibitors due to gastric polyps. - Provided Reflux Gourmet sample for  post-meal and bedtime use. - Discussed Reflux Gourmet as an OTC product with no drug interactions, compatible with current oral reflux medications. - Advised upright positioning post-meals and bed elevation for nocturnal reflux reduction. - Deferred oral reflux medication changes to gastroenterology specialist. - Scheduled follow-up in two months to assess therapy response.  Hoarseness Remote vocal cord polyp resolved spontaneously, no recent voice changes. - Documented laryngoscopic findings with photographic evidence for records and provider communication. - No further intervention required for hoarseness or vocal cord pathology.  - Orders placed: No orders of the defined types were placed in this encounter.  - Medications prescribed/continued/adjusted:  Meds ordered this encounter  Medications   mupirocin  ointment (BACTROBAN ) 2 %    Sig: Apply 1 Application topically 2 (two) times daily. Apply small amount inside nose    Dispense:  22 g    Refill:  0   sodium chloride  (OCEAN) 0.65 % SOLN nasal spray    Sig: Place 2 sprays into both nostrils 4 (four) times daily.    Dispense:  30 mL    Refill:  12   - Education materials provided to the patient. - Follow up: 2 months. Patient instructed to return sooner or go to the ED if new/worsening symptoms develop.  Thank you for allowing me the opportunity to care for your patient. Please do not hesitate to contact me should you have any other questions.  Sincerely, Penne Croak, DO Otolaryngologist (ENT) [facility] Phone: [phone] Fax: [phone]  04/01/2024, 10:37 AM        [1]  Current Outpatient Medications:    acetaminophen  (TYLENOL ) 500 MG tablet, Take 1,000 mg by mouth every 6 (six) hours as needed., Disp: , Rfl:    amLODipine  (NORVASC ) 2.5 MG tablet, Take 1 tablet (2.5 mg total) by mouth daily., Disp: 90 tablet, Rfl: 3   Calcium Citrate (CAL-CITRATE PO), Take 2 tablets by mouth 3 (three) times a  week., Disp: , Rfl:    cetirizine (ZYRTEC) 10 MG tablet, Take 10 mg by mouth daily., Disp: , Rfl:    Cholecalciferol (VITAMIN D3) 50 MCG (2000 UT) TABS, Take by mouth. Take 1 tablet daily., Disp: , Rfl:    famotidine  (PEPCID ) 20 MG tablet, Take 1 tablet (20 mg total) by mouth 2 (two) times daily for 90 days (Patient taking differently: Take 40 mg by mouth 2 (two) times daily.), Disp: 180 tablet, Rfl: 3   fluticasone  (FLONASE ) 50 MCG/ACT nasal spray, Place 2 sprays into both nostrils daily., Disp: 48 g, Rfl: 3   Lidocaine -Hydrocortisone  Ace 3-0.5 % KIT, Apply 1 Application topically daily as needed., Disp: 1 kit, Rfl: 2   lisinopril  (ZESTRIL ) 10 MG tablet, Take 1 tablet (10 mg total) by mouth 2 (two) times daily., Disp: 180 tablet, Rfl: 3   metoprolol  succinate (TOPROL -XL) 50 MG 24 hr tablet, Take 1-2 tablets (50-100 mg total) by mouth 2 (two) times daily with or immediately following a meal., Disp: 360 tablet, Rfl: 1   mupirocin  ointment (BACTROBAN ) 2 %, Apply 1 Application topically 2 (two) times daily. Apply small amount inside nose, Disp: 22 g, Rfl: 0   sodium chloride  (OCEAN) 0.65 %  SOLN nasal spray, Place 2 sprays into both nostrils 4 (four) times daily., Disp: 30 mL, Rfl: 12   Vonoprazan Fumarate  (VOQUEZNA ) 20 MG TABS, Take 20 mg by mouth daily., Disp: 30 tablet, Rfl: 0   esomeprazole  (NEXIUM ) 40 MG capsule, Take 1 capsule (40 mg total) by mouth 1/2 to 1 hour before morning meal every morning (Patient not taking: Reported on 04/01/2024), Disp: 90 capsule, Rfl: 3   Loperamide-Simethicone  2-125 MG TABS, Take by mouth as needed. (Patient not taking: Reported on 04/01/2024), Disp: , Rfl:

## 2024-04-02 ENCOUNTER — Other Ambulatory Visit: Payer: Self-pay | Admitting: Family Medicine

## 2024-04-06 ENCOUNTER — Other Ambulatory Visit: Payer: Self-pay

## 2024-04-08 ENCOUNTER — Other Ambulatory Visit

## 2024-04-09 ENCOUNTER — Other Ambulatory Visit (HOSPITAL_BASED_OUTPATIENT_CLINIC_OR_DEPARTMENT_OTHER): Payer: Self-pay

## 2024-04-17 ENCOUNTER — Other Ambulatory Visit (INDEPENDENT_AMBULATORY_CARE_PROVIDER_SITE_OTHER)

## 2024-04-17 ENCOUNTER — Encounter: Payer: Self-pay | Admitting: Family

## 2024-04-17 DIAGNOSIS — E785 Hyperlipidemia, unspecified: Secondary | ICD-10-CM

## 2024-04-17 LAB — LIPID PANEL
Cholesterol: 206 mg/dL — ABNORMAL HIGH
HDL: 66 mg/dL
LDL Cholesterol (Calc): 115 mg/dL — ABNORMAL HIGH
Non-HDL Cholesterol (Calc): 140 mg/dL — ABNORMAL HIGH
Total CHOL/HDL Ratio: 3.1 (calc)
Triglycerides: 141 mg/dL

## 2024-04-20 ENCOUNTER — Ambulatory Visit: Payer: Self-pay | Admitting: Student

## 2024-04-20 ENCOUNTER — Encounter: Payer: Self-pay | Admitting: Student

## 2024-04-22 NOTE — Assessment & Plan Note (Signed)
 Her BMI today is 46. Encouraged ongoing weight-loss efforts, as even modest reductions can significantly improve overall health. Encouraged DASH or MIND diet, decrease po intake and increase exercise as tolerated. Needs 7-8 hours of sleep nightly. Avoid trans fats, eat small, frequent meals every 4-5 hours with lean proteins, complex carbs and healthy fats. Minimize simple carbs, high fat foods and processed foods

## 2024-04-22 NOTE — Assessment & Plan Note (Signed)
 Supplement and monitor

## 2024-04-22 NOTE — Assessment & Plan Note (Signed)
 [id number] acceptable, minimize simple carbs. Increase exercise as tolerated.

## 2024-04-22 NOTE — Assessment & Plan Note (Signed)
 Encourage heart healthy diet such as MIND or DASH diet, increase exercise, avoid trans fats, simple carbohydrates and processed foods, consider a krill or fish or flaxseed oil cap daily.

## 2024-04-22 NOTE — Progress Notes (Signed)
 "  Subjective:    Patient ID: Carrie Elliott, female    DOB: Mar 12, 1951, 74 y.o.   MRN: [id number]  No chief complaint on file.   HPI Discussed the use of AI scribe software for clinical note transcription with the patient, who gave verbal consent to proceed.  History of Present Illness Carrie Elliott is a 74 year old female who presents for follow-up on her gastrointestinal medication and recent fall.  She has a history of gastrointestinal issues, including large polyps, and has undergone multiple colonoscopies and endoscopies. Her previous medication, Nexium , was ineffective, leading to a trial of Voquezna , a potassium-based inhibitor. However, she experienced significant joint pain with Voquezna  and discontinued it on April 09, 2024, resuming Nexium  on April 10, 2024. Her current medication is Nexium . She also takes famotidine  as needed.  She experienced a fall on April 19, 2024, when she was hit by a car door and fell into a fence, resulting in a headache and generalized pain, but no fractures. She did not seek medical attention at the time. She reports ongoing shoulder pain, particularly when sleeping on her side, and has been engaging in physical activities such as shoveling snow and moving heavy items, which have not worsened her condition.  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 She follows up with a specialist annually for this condition.  She reports dietary changes, including reducing processed foods and artificial sweeteners, and is considering further lifestyle modifications to manage her weight and overall health. She has a history of cataract surgery and reports dry eyes, for which she uses over-the-counter eye drops. She also has hearing aids and uses a Geographical Information Systems Officer V device to assist with hearing in noisy environments.    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    Past Surgical History:  Procedure Laterality Date   APPENDECTOMY  1989   atypical nevus excision     BREAST BIOPSY Right    CHOLECYSTECTOMY  1989   COLONOSCOPY WITH PROPOFOL  N/A 05/27/2015   Procedure: COLONOSCOPY WITH PROPOFOL ;  Surgeon: Oliva Boots, MD;  Location: [facility];  Service: Endoscopy;  Laterality: N/A;   CYSTOSCOPY W/ URETERAL STENT REMOVAL  1999   ESOPHAGOGASTRODUODENOSCOPY  2011   with esophageal stent, perforation    ESOPHAGOGASTRODUODENOSCOPY (EGD) WITH PROPOFOL  N/A 05/27/2015   Procedure: ESOPHAGOGASTRODUODENOSCOPY (EGD) WITH PROPOFOL ;  Surgeon: Oliva Boots, MD;  Location: [facility];  Service: Endoscopy;  Laterality: N/A;   HEMORRHOID SURGERY  2005   LEFT OOPHORECTOMY  1999   complicated by ureteral injuery requiring a ureteral stent    MMK / ANTERIOR VESICOURETHROPEXY / URETHROPEXY  2000   TONSILLECTOMY AND ADENOIDECTOMY     TOTAL ABDOMINAL HYSTERECTOMY  2000  with USO   TUBAL LIGATION  1981   wisdom teeth removal  1964    Family History  Problem Relation Age of Onset   Osteoporosis Mother    Transient ischemic attack Mother    Cancer Mother        colon   Heart disease Mother        afib   Hyperlipidemia Mother    Colon polyps Father    Hypertension Father    Heart failure Father    Melanoma Father        multiple   Heart attack Father 65        x2   Heart disease Father        Bundle Block   Meniere's disease Father    Cancer Father        melanoma   Obesity Brother    Stroke Maternal Grandmother    Cancer Maternal Grandmother        skin, melanoma, sarcoma   Colon polyps Maternal Grandmother    Diverticulitis Maternal Grandmother    Stroke Maternal Grandfather    Heart disease Maternal Grandfather    Diabetes Paternal Grandmother     Social History   Socioeconomic History   Marital status: Married    Spouse name: Not on file   Number of children: Not on file   Years of education: Not on file   Highest education level: Not on file  Occupational History   Not on file  Tobacco Use   Smoking status: Never   Smokeless tobacco: Never  Vaping Use   Vaping status: Never Used  Substance and Sexual Activity   Alcohol use: No   Drug use: No   Sexual activity: Yes    Birth control/protection: Surgical    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, avoids spicy  Other Topics Concern   Not on file  Social History Narrative   Retired   6 grand kids   Daughter and son in [place]-    Social Drivers of Health   Tobacco Use: Low Risk (03/05/2024)   Patient History    Smoking Tobacco Use: Never    Smokeless Tobacco Use: Never    Passive Exposure: Not on file  Financial Resource Strain: Low Risk (12/17/2023)   Overall Financial Resource Strain (CARDIA)    Difficulty of Paying Living Expenses: Not very hard  Food Insecurity: No Food Insecurity (12/17/2023)   Epic    Worried About Radiation Protection Practitioner of Food in the Last Year: Never true    Ran Out of Food in the Last Year: Never true  Transportation Needs: No Transportation Needs (12/17/2023)   Epic    Lack of Transportation (Medical): No    Lack of Transportation (Non-Medical): No  Physical Activity: Inactive (12/17/2023)   Exercise Vital Sign    Days of Exercise per Week: 0 days    Minutes of Exercise per Session: 0 min   Stress: No Stress Concern Present (12/17/2023)   [employer] of Occupational Health - Occupational Stress Questionnaire    Feeling of Stress: Not at all  Social Connections: Socially Integrated (12/17/2023)   Social Connection and Isolation Panel    Frequency of Communication with Friends and Family: More than three times a week    Frequency of Social Gatherings with Friends and Family: More than three times a week    Attends Religious Services: More than 4 times per year    Active Member of [employer] or Organizations: Yes  Attends Banker Meetings: More than 4 times per year    Marital Status: Married  Catering Manager Violence: Not At Risk (12/17/2023)   Epic    Fear of Current or Ex-Partner: No    Emotionally Abused: No    Physically Abused: No    Sexually Abused: No  Depression (PHQ2-9): Low Risk (03/05/2024)   Depression (PHQ2-9)    PHQ-2 Score: 0  Alcohol Screen: Low Risk (12/17/2023)   Alcohol Screen    Last Alcohol Screening Score (AUDIT): 0  Housing: Low Risk (12/17/2023)   Epic    Unable to Pay for Housing in the Last Year: No    Number of Times Moved in the Last Year: 0    Homeless in the Last Year: No  Utilities: Not At Risk (12/17/2023)   Epic    Threatened with loss of utilities: No  Health Literacy: Adequate Health Literacy (12/17/2023)   B1300 Health Literacy    Frequency of need for help with medical instructions: Never    Outpatient Medications Prior to Visit  Medication Sig Dispense Refill   acetaminophen  (TYLENOL ) 500 MG tablet Take 1,000 mg by mouth every 6 (six) hours as needed.     amLODipine  (NORVASC ) 2.5 MG tablet Take 1 tablet (2.5 mg total) by mouth daily. 90 tablet 3   Calcium Citrate (CAL-CITRATE PO) Take 2 tablets by mouth 3 (three) times a week.     cetirizine (ZYRTEC) 10 MG tablet Take 10 mg by mouth daily.     Cholecalciferol (VITAMIN D3) 50 MCG (2000 UT) TABS Take by mouth. Take 1 tablet daily.      esomeprazole  (NEXIUM ) 40 MG capsule Take 1 capsule (40 mg total) by mouth 1/2 to 1 hour before morning meal every morning (Patient not taking: Reported on 04/01/2024) 90 capsule 3   famotidine  (PEPCID ) 20 MG tablet Take 1 tablet (20 mg total) by mouth 2 (two) times daily for 90 days (Patient taking differently: Take 40 mg by mouth 2 (two) times daily.) 180 tablet 3   fluticasone  (FLONASE ) 50 MCG/ACT nasal spray Place 2 sprays into both nostrils daily. 48 g 3   Lidocaine -Hydrocortisone  Ace 3-0.5 % KIT Apply 1 Application topically daily as needed. 1 kit 2   lisinopril  (ZESTRIL ) 10 MG tablet Take 1 tablet (10 mg total) by mouth 2 (two) times daily. 180 tablet 3   Loperamide-Simethicone  2-125 MG TABS Take by mouth as needed. (Patient not taking: Reported on 04/01/2024)     metoprolol  succinate (TOPROL -XL) 50 MG 24 hr tablet Take 1-2 tablets (50-100 mg total) by mouth 2 (two) times daily with or immediately following a meal. 360 tablet 1   mupirocin  ointment (BACTROBAN ) 2 % Apply small amount inside nose 2 (two) times daily. 22 g 0   sodium chloride  (OCEAN) 0.65 % SOLN nasal spray Place 2 sprays into both nostrils 4 (four) times daily. 44 mL 12   Vonoprazan Fumarate  (VOQUEZNA ) 20 MG TABS Take 20 mg by mouth daily. 30 tablet 0   No facility-administered medications prior to visit.    Allergies[1]  Review of Systems  Constitutional:  Positive for malaise/fatigue. Negative for fever.  HENT:  Negative for congestion.   Eyes:  Negative for blurred vision.  Respiratory:  Negative for shortness of breath.   Cardiovascular:  Negative for chest pain, palpitations and leg swelling.  Gastrointestinal:  Positive for abdominal pain, nausea and vomiting. Negative for blood in stool.  Genitourinary:  Negative for dysuria and frequency.  Musculoskeletal:  Positive for joint pain. Negative for falls.  Skin:  Negative for rash.  Neurological:  Negative for dizziness, loss of consciousness and headaches.   Endo/Heme/Allergies:  Negative for environmental allergies.  Psychiatric/Behavioral:  Negative for depression. The patient has insomnia. The patient is not nervous/anxious.        Objective:    Physical Exam Constitutional:      General: She is not in acute distress.    Appearance: Normal appearance. She is well-developed. She is not toxic-appearing.  HENT:     Head: Normocephalic and atraumatic.     Right Ear: External ear normal.     Left Ear: External ear normal.     Nose: Nose normal.  Eyes:     General:        Right eye: No discharge.        Left eye: No discharge.     Conjunctiva/sclera: Conjunctivae normal.  Neck:     Thyroid : No thyromegaly.  Cardiovascular:     Rate and Rhythm: Normal rate and regular rhythm.     Heart sounds: Normal heart sounds. No murmur heard. Pulmonary:     Effort: Pulmonary effort is normal. No respiratory distress.     Breath sounds: Normal breath sounds.  Abdominal:     General: Bowel sounds are normal.     Palpations: Abdomen is soft.     Tenderness: There is no abdominal tenderness. There is no guarding.  Musculoskeletal:        General: Normal range of motion.     Cervical back: Neck supple.  Lymphadenopathy:     Cervical: No cervical adenopathy.  Skin:    General: Skin is warm and dry.  Neurological:     Mental Status: She is alert and oriented to person, place, and time.  Psychiatric:        Mood and Affect: Mood normal.        Behavior: Behavior normal.        Thought Content: Thought content normal.        Judgment: Judgment normal.    There were no vitals taken for this visit. Wt Readings from Last 3 Encounters:  04/01/24 175 lb (79.4 kg)  03/05/24 175 lb (79.4 kg)  12/17/23 172 lb (78 kg)    Diabetic Foot Exam - Simple   No data filed    Lab Results  Component Value Date   WBC 7.4 11/28/2023   HGB 12.2 11/28/2023   HCT 37.1 11/28/2023   PLT 261.0 11/28/2023   GLUCOSE 87 11/28/2023   CHOL 206 (H) 04/17/2024    TRIG 141 04/17/2024   HDL 66 04/17/2024   LDLDIRECT 108.0 03/08/2022   LDLCALC 115 (H) 04/17/2024   ALT 16 11/28/2023   AST 18 11/28/2023   NA 136 11/28/2023   K 4.5 11/28/2023   CL 101 11/28/2023   CREATININE 0.84 11/28/2023   BUN 24 (H) 11/28/2023   CO2 28 11/28/2023   TSH 0.74 11/28/2023   INR 1.19 02/07/2010   HGBA1C 6.1 11/28/2023    Lab Results  Component Value Date   TSH 0.74 11/28/2023   Lab Results  Component Value Date   WBC 7.4 11/28/2023   HGB 12.2 11/28/2023   HCT 37.1 11/28/2023   MCV 88.5 11/28/2023   PLT 261.0 11/28/2023   Lab Results  Component Value Date   NA 136 11/28/2023   K 4.5 11/28/2023   CO2 28 11/28/2023   GLUCOSE 87 11/28/2023   BUN 24 (H)  11/28/2023   CREATININE 0.84 11/28/2023   BILITOT 0.4 11/28/2023   ALKPHOS 54 11/28/2023   AST 18 11/28/2023   ALT 16 11/28/2023   PROT 7.2 11/28/2023   ALBUMIN 4.2 11/28/2023   CALCIUM 9.2 11/28/2023   ANIONGAP 11 08/27/2023   EGFR 80 11/08/2023   GFR 69.06 11/28/2023   Lab Results  Component Value Date   CHOL 206 (H) 04/17/2024   Lab Results  Component Value Date   HDL 66 04/17/2024   Lab Results  Component Value Date   LDLCALC 115 (H) 04/17/2024   Lab Results  Component Value Date   TRIG 141 04/17/2024   Lab Results  Component Value Date   CHOLHDL 3.1 04/17/2024   Lab Results  Component Value Date   HGBA1C 6.1 11/28/2023       Assessment & Plan:  Vitamin D  deficiency Assessment & Plan: Supplement and monitor    Vitamin B12 deficiency Assessment & Plan: Supplement and monitor    Class 1 obesity with body mass index (BMI) of 34.0 to 34.9 in adult, unspecified obesity type, unspecified whether serious comorbidity present Assessment & Plan: Encouraged DASH or MIND diet, decrease po intake and increase exercise as tolerated. Needs 7-8 hours of sleep nightly. Avoid trans fats, eat small, frequent meals every 4-5 hours with lean proteins, complex carbs and healthy fats.  Minimize simple carbs, high fat foods and processed foods.    Primary hypertension Assessment & Plan: Well controlled, no changes to meds. Encouraged heart healthy diet such as the DASH diet and exercise as tolerated.     Hyperglycemia Assessment & Plan: hgba1c acceptable, minimize simple carbs. Increase exercise as tolerated.   Hyperlipidemia, unspecified hyperlipidemia type Assessment & Plan: Encourage heart healthy diet such as MIND or DASH diet, increase exercise, avoid trans fats, simple carbohydrates and processed foods, consider a krill or fish or flaxseed oil cap daily.      Assessment and Plan Assessment & Plan Gastroesophageal reflux disease with esophageal injury and polyps Chronic GERD with esophageal injury and polyps. Previous treatment with Nexium  was ineffective, leading to significant chest discomfort. Voquezna  was more effective but caused joint pain. Concerns about long-term polyp formation with Voquezna , similar to Nexium . GI specialist advised against starting new medications and recommended monitoring. She plans to restart Voquezna  with samples and monitor for side effects. Discussed potential cost reduction for Voquezna  through pharmaceutical assistance programs. - Restart Voquezna  with samples and monitor for side effects. - Will coordinate with GI specialist for assistance with Voquezna  cost reduction. - Will schedule upper endoscopy in 3 years and lower endoscopy in 5 years.  Class 1 obesity Recent weight gain attributed to holiday indulgence and reduced physical activity due to cold weather. Discussed dietary habits, including the impact of artificial sweeteners and corn syrup. Encouraged moderation in diet and exercise. Discussed potential referral to a weight management program for structured support. - Encouraged dietary modifications, including reducing artificial sweeteners and corn syrup. - Discussed potential referral to a weight management program for  structured support.  Hyperlipidemia Recent lab results not improved. She prefers to avoid adding a statin while managing GERD medication situation. - Continue to monitor lipid levels and reassess after GERD medication situation is stabilized.  Primary hypertension - Continue current antihypertensive regimen.  Left knee osteoarthritis Chronic left knee osteoarthritis. Discussed the importance of regular exercise and physical therapy exercises to maintain joint function. - Continue physical therapy exercises for left knee osteoarthritis.  Insomnia Chronic insomnia. Discussed the use of  a sleep app to help manage sleep patterns. - Continue using sleep app to manage insomnia.  Vitamin D  deficiency Previous supplementation. Current levels need reassessment. - Ordered vitamin D  level to reassess deficiency status.  General Health Maintenance Discussed the importance of regular health maintenance, including vaccinations and screenings. Encouraged regular dental visits and eye exams. - Ordered comprehensive metabolic panel, kidney and liver function tests, vitamin D  level, blood counts, and thyroid  function tests. - Encouraged regular dental visits and eye exams.  Recording duration: 36 minutes     Harlene Horton, MD     [1] Allergies Allergen Reactions   Pseudoephedrine Hcl Er Other (See Comments)    TACHYCARDIA    Singulair [Montelukast Sodium] Other (See Comments)    Mental status changes, irritable   Cefdinir Hives   Celebrex [Celecoxib] Other (See Comments)    ABDOMINAL PAIN    Ciprofloxacin Other (See Comments)    ABDOMINAL PAIN    Codeine Nausea And Vomiting    NAUSEA VOMITING - can take with zofran   "

## 2024-04-22 NOTE — Assessment & Plan Note (Signed)
 Well controlled, no changes to meds. Encouraged heart healthy diet such as the DASH diet and exercise as tolerated.

## 2024-04-23 ENCOUNTER — Encounter: Payer: Self-pay | Admitting: Family Medicine

## 2024-04-23 ENCOUNTER — Ambulatory Visit: Admitting: Family Medicine

## 2024-04-23 VITALS — BP 114/72 | HR 80 | Temp 98.5°F | Resp 16 | Ht 60.0 in | Wt 178.2 lb

## 2024-04-23 DIAGNOSIS — R739 Hyperglycemia, unspecified: Secondary | ICD-10-CM

## 2024-04-23 DIAGNOSIS — E785 Hyperlipidemia, unspecified: Secondary | ICD-10-CM | POA: Diagnosis not present

## 2024-04-23 DIAGNOSIS — E538 Deficiency of other specified B group vitamins: Secondary | ICD-10-CM

## 2024-04-23 DIAGNOSIS — Z6834 Body mass index (BMI) 34.0-34.9, adult: Secondary | ICD-10-CM | POA: Diagnosis not present

## 2024-04-23 DIAGNOSIS — E66811 Obesity, class 1: Secondary | ICD-10-CM

## 2024-04-23 DIAGNOSIS — Z0000 Encounter for general adult medical examination without abnormal findings: Secondary | ICD-10-CM

## 2024-04-23 DIAGNOSIS — I10 Essential (primary) hypertension: Secondary | ICD-10-CM | POA: Diagnosis not present

## 2024-04-23 DIAGNOSIS — E559 Vitamin D deficiency, unspecified: Secondary | ICD-10-CM | POA: Diagnosis not present

## 2024-04-23 LAB — CBC WITH DIFFERENTIAL/PLATELET
Basophils Absolute: 0 10*3/uL (ref 0.0–0.1)
Basophils Relative: 0.6 % (ref 0.0–3.0)
Eosinophils Absolute: 0.1 10*3/uL (ref 0.0–0.7)
Eosinophils Relative: 2.1 % (ref 0.0–5.0)
HCT: 38 % (ref 36.0–46.0)
Hemoglobin: 12.6 g/dL (ref 12.0–15.0)
Lymphocytes Relative: 22.6 % (ref 12.0–46.0)
Lymphs Abs: 1.4 10*3/uL (ref 0.7–4.0)
MCHC: 33.1 g/dL (ref 30.0–36.0)
MCV: 88 fl (ref 78.0–100.0)
Monocytes Absolute: 0.6 10*3/uL (ref 0.1–1.0)
Monocytes Relative: 10.1 % (ref 3.0–12.0)
Neutro Abs: 4.1 10*3/uL (ref 1.4–7.7)
Neutrophils Relative %: 64.6 % (ref 43.0–77.0)
Platelets: 277 10*3/uL (ref 150.0–400.0)
RBC: 4.32 Mil/uL (ref 3.87–5.11)
RDW: 12.8 % (ref 11.5–15.5)
WBC: 6.4 10*3/uL (ref 4.0–10.5)

## 2024-04-23 LAB — COMPREHENSIVE METABOLIC PANEL WITH GFR
ALT: 17 U/L (ref 3–35)
AST: 21 U/L (ref 5–37)
Albumin: 4.4 g/dL (ref 3.5–5.2)
Alkaline Phosphatase: 63 U/L (ref 39–117)
BUN: 23 mg/dL (ref 6–23)
CO2: 30 meq/L (ref 19–32)
Calcium: 10 mg/dL (ref 8.4–10.5)
Chloride: 100 meq/L (ref 96–112)
Creatinine, Ser: 0.78 mg/dL (ref 0.40–1.20)
GFR: 75.27 mL/min
Glucose, Bld: 95 mg/dL (ref 70–99)
Potassium: 4.7 meq/L (ref 3.5–5.1)
Sodium: 135 meq/L (ref 135–145)
Total Bilirubin: 0.4 mg/dL (ref 0.2–1.2)
Total Protein: 7.4 g/dL (ref 6.0–8.3)

## 2024-04-23 LAB — VITAMIN B12: Vitamin B-12: 641 pg/mL (ref 211–911)

## 2024-04-23 LAB — HEMOGLOBIN A1C: Hgb A1c MFr Bld: 5.7 % (ref 4.6–6.5)

## 2024-04-23 LAB — VITAMIN D 25 HYDROXY (VIT D DEFICIENCY, FRACTURES): VITD: 39.3 ng/mL (ref 30.00–100.00)

## 2024-04-23 LAB — TSH: TSH: 0.91 u[IU]/mL (ref 0.35–5.50)

## 2024-04-23 MED ORDER — VOQUEZNA 20 MG PO TABS: 20.0000 mg | ORAL_TABLET | Freq: Every day | ORAL | Status: AC

## 2024-04-23 NOTE — Patient Instructions (Addendum)
 Preventive Care 83 Years and Older, Female Preventive care refers to lifestyle choices and visits with your health care provider that can promote health and wellness. Preventive care visits are also called wellness exams. What can I expect for my preventive care visit? Counseling Your health care provider may ask you questions about your: Medical history, including: Past medical problems. Family medical history. Pregnancy and menstrual history. History of falls. Current health, including: Memory and ability to understand (cognition). Emotional well-being. Home life and relationship well-being. Sexual activity and sexual health. Lifestyle, including: Alcohol, nicotine or tobacco, and drug use. Access to firearms. Diet, exercise, and sleep habits. Work and work Astronomer. Sunscreen use. Safety issues such as seatbelt and bike helmet use. Physical exam Your health care provider will check your: Height and weight. These may be used to calculate your BMI (body mass index). BMI is a measurement that tells if you are at a healthy weight. Waist circumference. This measures the distance around your waistline. This measurement also tells if you are at a healthy weight and may help predict your risk of certain diseases, such as type 2 diabetes and high blood pressure. Heart rate and blood pressure. Body temperature. Skin for abnormal spots. What immunizations do I need?  Vaccines are usually given at various ages, according to a schedule. Your health care provider will recommend vaccines for you based on your age, medical history, and lifestyle or other factors, such as travel or where you work. What tests do I need? Screening Your health care provider may recommend screening tests for certain conditions. This may include: Lipid and cholesterol levels. Hepatitis C test. Hepatitis B test. HIV (human immunodeficiency virus) test. STI (sexually transmitted infection) testing, if you are at  risk. Lung cancer screening. Colorectal cancer screening. Diabetes screening. This is done by checking your blood sugar (glucose) after you have not eaten for a while (fasting). Mammogram. Talk with your health care provider about how often you should have regular mammograms. BRCA-related cancer screening. This may be done if you have a family history of breast, ovarian, tubal, or peritoneal cancers. Bone density scan. This is done to screen for osteoporosis. Talk with your health care provider about your test results, treatment options, and if necessary, the need for more tests. Follow these instructions at home: Eating and drinking  Eat a diet that includes fresh fruits and vegetables, whole grains, lean protein, and low-fat dairy products. Limit your intake of foods with high amounts of sugar, saturated fats, and salt. Take vitamin and mineral supplements as recommended by your health care provider. Do not drink alcohol if your health care provider tells you not to drink. If you drink alcohol: Limit how much you have to 0-1 drink a day. Know how much alcohol is in your drink. In the [place] one drink equals one 12 oz bottle of beer (355 mL), one 5 oz glass of wine (148 mL), or one 1 oz glass of hard liquor (44 mL). Lifestyle Brush your teeth every morning and night with fluoride toothpaste. Floss one time each day. Exercise for at least 30 minutes 5 or more days each week. Do not use any products that contain nicotine or tobacco. These products include cigarettes, chewing tobacco, and vaping devices, such as e-cigarettes. If you need help quitting, ask your health care provider. Do not use drugs. If you are sexually active, practice safe sex. Use a condom or other form of protection in order to prevent STIs. Take aspirin only as told by  your health care provider. Make sure that you understand how much to take and what form to take. Work with your health care provider to find out whether it  is safe and beneficial for you to take aspirin daily. Ask your health care provider if you need to take a cholesterol-lowering medicine (statin). Find healthy ways to manage stress, such as: Meditation, yoga, or listening to music. Journaling. Talking to a trusted person. Spending time with friends and family. Minimize exposure to UV radiation to reduce your risk of skin cancer. Safety Always wear your seat belt while driving or riding in a vehicle. Do not drive: If you have been drinking alcohol. Do not ride with someone who has been drinking. When you are tired or distracted. While texting. If you have been using any mind-altering substances or drugs. Wear a helmet and other protective equipment during sports activities. If you have firearms in your house, make sure you follow all gun safety procedures. What's next? Visit your health care provider once a year for an annual wellness visit. Ask your health care provider how often you should have your eyes and teeth checked. Stay up to date on all vaccines. This information is not intended to replace advice given to you by your health care provider. Make sure you discuss any questions you have with your health care provider. Document Revised: 09/07/2020 Document Reviewed: 09/07/2020 Elsevier Patient Education  2024 [employer].

## 2024-04-23 NOTE — Assessment & Plan Note (Signed)
 Patient encouraged to maintain heart healthy diet, regular exercise, adequate sleep. Consider daily probiotics. Take medications as prescribed.  Colonoscopy 02/2024 due to numerous polyps will need to repeat in 5 years, repeat upper endoscopy in 3 years Pap aged out MM 12/2023 repeat every 1-2 years Dexa 10/2023 repeat every 2-5 years

## 2024-04-24 ENCOUNTER — Ambulatory Visit: Payer: Self-pay | Admitting: Family Medicine

## 2024-04-24 ENCOUNTER — Encounter: Payer: Self-pay | Admitting: Family Medicine

## 2024-04-24 NOTE — Progress Notes (Signed)
 Called pt and no answer left message to return call.

## 2024-04-25 ENCOUNTER — Encounter: Payer: Self-pay | Admitting: Family Medicine

## 2024-05-06 ENCOUNTER — Ambulatory Visit: Admitting: Pulmonary Disease

## 2024-06-03 ENCOUNTER — Ambulatory Visit (INDEPENDENT_AMBULATORY_CARE_PROVIDER_SITE_OTHER)

## 2024-06-29 ENCOUNTER — Ambulatory Visit: Admitting: Family Medicine

## 2024-07-22 ENCOUNTER — Encounter: Admitting: Student

## 2024-10-21 ENCOUNTER — Ambulatory Visit: Admitting: Urology

## 2024-12-23 ENCOUNTER — Ambulatory Visit
